# Patient Record
Sex: Male | Born: 1950
Health system: Southern US, Community
[De-identification: ages and names within clinical notes are randomized; demographics above are authoritative.]

## PROBLEM LIST (undated history)

## (undated) DIAGNOSIS — I10 Essential (primary) hypertension: Secondary | ICD-10-CM

## (undated) DIAGNOSIS — K5790 Diverticulosis of intestine, part unspecified, without perforation or abscess without bleeding: Secondary | ICD-10-CM

## (undated) DIAGNOSIS — G4730 Sleep apnea, unspecified: Secondary | ICD-10-CM

## (undated) DIAGNOSIS — R911 Solitary pulmonary nodule: Secondary | ICD-10-CM

## (undated) DIAGNOSIS — G4733 Obstructive sleep apnea (adult) (pediatric): Secondary | ICD-10-CM

## (undated) DIAGNOSIS — D128 Benign neoplasm of rectum: Secondary | ICD-10-CM

## (undated) DIAGNOSIS — E785 Hyperlipidemia, unspecified: Secondary | ICD-10-CM

## (undated) DIAGNOSIS — F329 Major depressive disorder, single episode, unspecified: Secondary | ICD-10-CM

## (undated) DIAGNOSIS — Z8619 Personal history of other infectious and parasitic diseases: Secondary | ICD-10-CM

## (undated) DIAGNOSIS — F32A Depression, unspecified: Secondary | ICD-10-CM

## (undated) DIAGNOSIS — D126 Benign neoplasm of colon, unspecified: Secondary | ICD-10-CM

## (undated) DIAGNOSIS — K219 Gastro-esophageal reflux disease without esophagitis: Secondary | ICD-10-CM

## (undated) DIAGNOSIS — J45909 Unspecified asthma, uncomplicated: Secondary | ICD-10-CM

## (undated) DIAGNOSIS — E559 Vitamin D deficiency, unspecified: Secondary | ICD-10-CM

## (undated) HISTORY — PX: POLYPECTOMY: SHX149

## (undated) HISTORY — DX: Essential (primary) hypertension: I10

## (undated) HISTORY — DX: Vitamin D deficiency, unspecified: E55.9

## (undated) HISTORY — DX: Obstructive sleep apnea (adult) (pediatric): G47.33

## (undated) HISTORY — DX: Benign neoplasm of rectum: D12.8

## (undated) HISTORY — DX: Diverticulosis of intestine, part unspecified, without perforation or abscess without bleeding: K57.90

## (undated) HISTORY — DX: Benign neoplasm of colon, unspecified: D12.6

## (undated) HISTORY — DX: Major depressive disorder, single episode, unspecified: F32.9

## (undated) HISTORY — DX: Personal history of other infectious and parasitic diseases: Z86.19

## (undated) HISTORY — PX: COLONOSCOPY: SHX174

## (undated) HISTORY — DX: Unspecified asthma, uncomplicated: J45.909

## (undated) HISTORY — DX: Hyperlipidemia, unspecified: E78.5

## (undated) HISTORY — DX: Depression, unspecified: F32.A

## (undated) HISTORY — PX: HERNIA REPAIR: SHX51

## (undated) HISTORY — PX: TONSILLECTOMY: SUR1361

## (undated) HISTORY — DX: Gastro-esophageal reflux disease without esophagitis: K21.9

## (undated) HISTORY — DX: Sleep apnea, unspecified: G47.30

## (undated) HISTORY — DX: Solitary pulmonary nodule: R91.1

---

## 2013-05-24 ENCOUNTER — Telehealth: Payer: Self-pay | Admitting: Internal Medicine

## 2013-05-24 NOTE — Telephone Encounter (Signed)
Pt called he had labs done at [facility] 05/20/13 and wanted to make sure you received labs  Please advise

## 2013-05-24 NOTE — Telephone Encounter (Signed)
Do you recall receiving any results from [facility]

## 2013-05-24 NOTE — Telephone Encounter (Signed)
No. I have not received any labs. I have never seen this pt.

## 2013-05-27 NOTE — Telephone Encounter (Signed)
Informed patient we received his results today from [facility].

## 2013-06-03 ENCOUNTER — Encounter: Payer: Self-pay | Admitting: Neurology

## 2013-06-05 ENCOUNTER — Ambulatory Visit: Payer: Self-pay | Admitting: Internal Medicine

## 2013-06-12 ENCOUNTER — Encounter: Payer: Self-pay | Admitting: Internal Medicine

## 2013-06-12 ENCOUNTER — Ambulatory Visit (INDEPENDENT_AMBULATORY_CARE_PROVIDER_SITE_OTHER): Payer: PRIVATE HEALTH INSURANCE | Admitting: Internal Medicine

## 2013-06-12 VITALS — BP 130/72 | HR 88 | Temp 98.2°F | Ht 65.0 in | Wt 284.0 lb

## 2013-06-12 DIAGNOSIS — J309 Allergic rhinitis, unspecified: Secondary | ICD-10-CM

## 2013-06-12 DIAGNOSIS — R400 Somnolence: Secondary | ICD-10-CM

## 2013-06-12 DIAGNOSIS — Z1211 Encounter for screening for malignant neoplasm of colon: Secondary | ICD-10-CM | POA: Insufficient documentation

## 2013-06-12 DIAGNOSIS — F329 Major depressive disorder, single episode, unspecified: Secondary | ICD-10-CM | POA: Insufficient documentation

## 2013-06-12 DIAGNOSIS — F411 Generalized anxiety disorder: Secondary | ICD-10-CM

## 2013-06-12 DIAGNOSIS — M5010 Cervical disc disorder with radiculopathy, unspecified cervical region: Secondary | ICD-10-CM | POA: Insufficient documentation

## 2013-06-12 DIAGNOSIS — J302 Other seasonal allergic rhinitis: Secondary | ICD-10-CM | POA: Insufficient documentation

## 2013-06-12 DIAGNOSIS — F32A Depression, unspecified: Secondary | ICD-10-CM | POA: Insufficient documentation

## 2013-06-12 DIAGNOSIS — G4710 Hypersomnia, unspecified: Secondary | ICD-10-CM

## 2013-06-12 DIAGNOSIS — R300 Dysuria: Secondary | ICD-10-CM

## 2013-06-12 DIAGNOSIS — G4700 Insomnia, unspecified: Secondary | ICD-10-CM

## 2013-06-12 DIAGNOSIS — M5412 Radiculopathy, cervical region: Secondary | ICD-10-CM

## 2013-06-12 LAB — POCT URINALYSIS DIPSTICK
Bilirubin, UA: NEGATIVE
Blood, UA: NEGATIVE
Glucose, UA: NEGATIVE
Leukocytes, UA: NEGATIVE
Nitrite, UA: NEGATIVE
Spec Grav, UA: 1.02
Urobilinogen, UA: 1
pH, UA: 7.5

## 2013-06-12 MED ORDER — TRAZODONE HCL 300 MG PO TABS: 300.0000 mg | ORAL_TABLET | Freq: Every day | ORAL | Status: DC

## 2013-06-12 MED ORDER — ALPRAZOLAM 0.25 MG PO TABS: 0.2500 mg | ORAL_TABLET | Freq: Every evening | ORAL | Status: DC | PRN

## 2013-06-12 MED ORDER — GABAPENTIN 600 MG PO TABS: 600.0000 mg | ORAL_TABLET | Freq: Three times a day (TID) | ORAL | Status: DC

## 2013-06-12 MED ORDER — MOMETASONE FUROATE 50 MCG/ACT NA SUSP: 2.0000 | Freq: Every day | NASAL | Status: DC

## 2013-06-12 NOTE — Assessment & Plan Note (Signed)
Recommended screening colonoscopy. Patient declines and prefers to hold off for now.

## 2013-06-12 NOTE — Assessment & Plan Note (Signed)
Chronic insomnia much improved with use of trazodone. However, he still has trouble initiating sleep. As above, strongly recommended a sleep study to evaluate for obstructive sleep apnea. He declines. Will try low-dose alprazolam to help with initiation of sleep and control of anxiety.

## 2013-06-12 NOTE — Assessment & Plan Note (Signed)
Patient reports history of daytime somnolence. Symptoms are concerning for obstructive sleep apnea. Recommended getting a sleep study. Patient declines citing financial concerns. He would prefer to hold off on this for now. We discussed the potential risk sleep apnea including heart failure. He understands risks.

## 2013-06-12 NOTE — Assessment & Plan Note (Signed)
Body mass index is 47.26 kg/(m^2). Recommended referral for bariatric surgery. Patient would prefer to hold off for now. Will schedule evaluation with nutrition counseling. Reviewed recent labs including thyroid function and blood sugars which were normal.

## 2013-06-12 NOTE — Assessment & Plan Note (Signed)
Intermittent episodes of dysuria. Urinalysis is normal today. We'll continue to monitor.

## 2013-06-12 NOTE — Assessment & Plan Note (Signed)
Chronic history of allergic rhinitis and seasonal allergies. Minimal improvement with over-the-counter Claritin. Will add Nasonex. Patient will call if symptoms are not improving.

## 2013-06-12 NOTE — Assessment & Plan Note (Signed)
Patient with chronic history of anxiety. He reports that symptoms of chronic anxiety are much improved with use of Trazodone. Will continue.

## 2013-06-12 NOTE — Assessment & Plan Note (Signed)
History of chronic cervical radicular pain. Will request records on previous evaluation. We'll continue Neurontin.

## 2013-06-12 NOTE — Progress Notes (Signed)
Subjective:    Patient ID: Paul Cook, male    DOB: 05-29-1951, 62 y.o.   MRN: [id number]  HPI 62 year old male with history of obesity, hypertension, hyperlipidemia, insomnia, anxiety, chronic neck pain presents to establish care.  Insomnia -he reports years of chronic insomnia with both difficulty falling asleep and difficulty staying asleep. His former physician put him on trazodone with improvement in his symptoms. However, he continues to have some difficulty initiating sleep. He also notes daytime drowsiness. 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 He has never been evaluated for sleep apnea. He reports that he snores intermittently.  Anxiety - he also reports a long history of anxiety. He reports improvement with this with use of trazodone.  Obesity - he is concerned about his weight. He has tried following areas diet with no success. His ability to exercise is limited because of morbid obesity and chronic pain in his joints. He has never considered bariatric surgery.  Hypertension - he reports that blood pressure has been well-controlled with lisinopril hydrochlorothiazide. He denies any chest pain, palpitations, headache.  Allergies -he reports seasonal allergies with rhinorrhea and sneezing. He takes occasional over-the-counter Claritin with minimal improvement. He denies any fever, chills, sinus pressure.  Outpatient Encounter Prescriptions as of 06/12/2013  Medication Sig Dispense Refill  . gabapentin (NEURONTIN) 600 MG tablet Take 1 tablet (600 mg total) by mouth 3 (three) times daily.  90 tablet  3  . lisinopril-hydrochlorothiazide (PRINZIDE,ZESTORETIC) 20-25 MG per tablet Take 1 tablet by mouth daily.      . Omega-3 Fatty Acids (FISH OIL) 1000 MG CAPS Take by mouth.      . ranitidine (ZANTAC) 150 MG tablet Take 150 mg by mouth 2 (two) times daily.      . trazodone (DESYREL) 300 MG tablet Take 1 tablet (300 mg total) by mouth at bedtime.  30 tablet  3   . VALERIAN ROOT PO Take 1,000 mg by mouth.      . [DISCONTINUED] gabapentin (NEURONTIN) 600 MG tablet Take 600 mg by mouth 3 (three) times daily.      . [DISCONTINUED] niacin 500 MG tablet Take 500 mg by mouth daily with breakfast.      . [DISCONTINUED] trazodone (DESYREL) 300 MG tablet Take 300 mg by mouth at bedtime.      . ALPRAZolam (XANAX) 0.25 MG tablet Take 1 tablet (0.25 mg total) by mouth at bedtime as needed for sleep.  30 tablet  3  . mometasone (NASONEX) 50 MCG/ACT nasal spray Place 2 sprays into the nose daily.  17 g  12   No facility-administered encounter medications on file as of 06/12/2013.   BP 130/72  Pulse 88  Temp(Src) 98.2 F (36.8 C) (Oral)  Ht 5\' 5"  (1.651 m)  Wt 284 lb (128.822 kg)  BMI 47.26 kg/m2  SpO2 97%  Review of Systems  Constitutional: Positive for fatigue. Negative for fever, chills, activity change, appetite change and unexpected weight change.  HENT: Positive for neck pain (chronic).   Eyes: Negative for visual disturbance.  Respiratory: Negative for cough and shortness of breath.   Cardiovascular: Negative for chest pain, palpitations and leg swelling.  Gastrointestinal: Negative for abdominal pain and abdominal distention.  Genitourinary: Negative for dysuria, urgency and difficulty urinating.  Musculoskeletal: Negative for arthralgias and gait problem.  Skin: Negative for color change and rash.  Hematological: Negative for adenopathy.  Psychiatric/Behavioral: Positive for sleep disturbance. Negative for dysphoric mood. The patient is nervous/anxious.  Objective:   Physical Exam  Constitutional: He is oriented to person, place, and time. He appears well-developed and well-nourished. No distress.  HENT:  Head: Normocephalic and atraumatic.  Right Ear: External ear normal.  Left Ear: External ear normal.  Nose: Nose normal.  Mouth/Throat: Oropharynx is clear and moist. No oropharyngeal exudate.  Eyes: Conjunctivae and EOM are  normal. Pupils are equal, round, and reactive to light. Right eye exhibits no discharge. Left eye exhibits no discharge. No scleral icterus.  Neck: Normal range of motion. Neck supple. No tracheal deviation present. No thyromegaly present.  Cardiovascular: Normal rate, regular rhythm and normal heart sounds.  Exam reveals no gallop and no friction rub.   No murmur heard. Pulmonary/Chest: Effort normal and breath sounds normal. No respiratory distress. He has no wheezes. He has no rales. He exhibits no tenderness.  Abdominal: Soft. Bowel sounds are normal. He exhibits no distension and no mass. There is no tenderness. There is no rebound and no guarding.  Musculoskeletal: Normal range of motion. He exhibits no edema.  Lymphadenopathy:    He has no cervical adenopathy.  Neurological: He is alert and oriented to person, place, and time. No cranial nerve deficit. Coordination normal.  Skin: Skin is warm and dry. No rash noted. He is not diaphoretic. No erythema. No pallor.  Psychiatric: He has a normal mood and affect. His behavior is normal. Judgment and thought content normal.          Assessment & Plan:

## 2013-06-14 ENCOUNTER — Encounter: Payer: Self-pay | Admitting: Internal Medicine

## 2013-06-14 DIAGNOSIS — G4700 Insomnia, unspecified: Secondary | ICD-10-CM

## 2013-06-18 MED ORDER — TRAZODONE HCL 300 MG PO TABS: ORAL_TABLET | ORAL | Status: DC

## 2013-06-19 ENCOUNTER — Encounter: Payer: Self-pay | Admitting: Internal Medicine

## 2013-06-19 ENCOUNTER — Telehealth: Payer: Self-pay | Admitting: *Deleted

## 2013-06-19 NOTE — Telephone Encounter (Signed)
Sent patient a Mychart message.

## 2013-06-19 NOTE — Telephone Encounter (Signed)
Patient stated he was prescribed Xanax, when he first began taking it, it was working well but now it has lost it's effectiveness. It is not working as well for him and he would like to know could it be increased?

## 2013-06-19 NOTE — Telephone Encounter (Signed)
I don't think it is safe to increase dose on any of his sedating medications until we get a sleep study, as these medications can suppress respiration.

## 2013-06-20 NOTE — Telephone Encounter (Signed)
Patient verbally informed and agreed.

## 2013-07-18 ENCOUNTER — Other Ambulatory Visit: Payer: Self-pay | Admitting: *Deleted

## 2013-07-18 DIAGNOSIS — G4700 Insomnia, unspecified: Secondary | ICD-10-CM

## 2013-07-18 NOTE — Telephone Encounter (Signed)
Prior directions were 300 mg at bedtime, now directions are 300 mg tab tid. How should the directions read?

## 2013-07-19 MED ORDER — TRAZODONE HCL 300 MG PO TABS: 300.0000 mg | ORAL_TABLET | Freq: Every day | ORAL | Status: DC

## 2013-07-19 NOTE — Telephone Encounter (Signed)
The directions should read Trazodone 300mg  po at bedtime, #90. 1 refill.

## 2013-07-26 ENCOUNTER — Encounter: Payer: Self-pay | Admitting: *Deleted

## 2013-07-30 ENCOUNTER — Encounter: Payer: Self-pay | Admitting: Internal Medicine

## 2013-07-30 ENCOUNTER — Ambulatory Visit (INDEPENDENT_AMBULATORY_CARE_PROVIDER_SITE_OTHER): Payer: PRIVATE HEALTH INSURANCE | Admitting: Internal Medicine

## 2013-07-30 VITALS — BP 138/74 | HR 73 | Temp 98.3°F | Wt 282.0 lb

## 2013-07-30 DIAGNOSIS — G4710 Hypersomnia, unspecified: Secondary | ICD-10-CM

## 2013-07-30 DIAGNOSIS — R400 Somnolence: Secondary | ICD-10-CM

## 2013-07-30 DIAGNOSIS — K589 Irritable bowel syndrome without diarrhea: Secondary | ICD-10-CM

## 2013-07-30 MED ORDER — HYOSCYAMINE SULFATE 0.125 MG SL SUBL: 0.1250 mg | SUBLINGUAL_TABLET | SUBLINGUAL | Status: DC | PRN

## 2013-07-30 NOTE — Assessment & Plan Note (Addendum)
Symptoms of abdominal cramping after eating some foods such as brocoli. Recommended use of Beano and prn use of Levsin, which has worked well for him in the past.

## 2013-07-30 NOTE — Assessment & Plan Note (Signed)
Congratulated patient on weight loss. Encouraged him to continue to work with a nutritionist.

## 2013-07-30 NOTE — Progress Notes (Signed)
Subjective:    Patient ID: Paul Cook, male    DOB: [birth year], 62 y.o.   MRN: [id number]  HPI 62 year old male with history of obesity, hypertension, insomnia, anxiety presents for followup. At his last visit, we need referral to nutritionist. He reports significant improvement in his diet with limitation of carbohydrate intake. He has lost 2 pounds. He notes some difficulty with abdominal bloating with eating higher fiber foodsHe has not started an exercise . program. He continues to work full-time. He reports that he is sleeping better. Anxiety is better controlled with only intermittent use of alprazolam. He would like to proceed with sleep study which was recommended at his last visit given that he has persistent symptoms of daytime somnolence.  Outpatient Encounter Prescriptions as of 07/30/2013  Medication Sig Dispense Refill  . ALPRAZolam (XANAX) 0.25 MG tablet Take 1 tablet (0.25 mg total) by mouth at bedtime as needed for sleep.  30 tablet  3  . gabapentin (NEURONTIN) 600 MG tablet Take 1 tablet (600 mg total) by mouth 3 (three) times daily.  90 tablet  3  . lisinopril-hydrochlorothiazide (PRINZIDE,ZESTORETIC) 20-25 MG per tablet Take 1 tablet by mouth daily.      . mometasone (NASONEX) 50 MCG/ACT nasal spray Place 2 sprays into the nose daily.  17 g  12  . Omega-3 Fatty Acids (FISH OIL) 1000 MG CAPS Take by mouth.      . ranitidine (ZANTAC) 150 MG tablet Take 150 mg by mouth 2 (two) times daily.      . trazodone (DESYREL) 300 MG tablet Take 1 tablet (300 mg total) by mouth at bedtime.  90 tablet  0  . UNABLE TO FIND Calms Forty      . VALERIAN ROOT PO Take 1,000 mg by mouth.      . hyoscyamine (LEVSIN/SL) 0.125 MG SL tablet Place 1 tablet (0.125 mg total) under the tongue every 4 (four) hours as needed for cramping.  30 tablet  3   No facility-administered encounter medications on file as of 07/30/2013.   BP 138/74  Pulse 73  Temp(Src) 98.3 F (36.8 C) (Oral)  Wt 282 lb  (127.914 kg)  BMI 46.93 kg/m2  SpO2 95%  Review of Systems  Constitutional: Positive for fatigue. Negative for fever, chills, activity change, appetite change and unexpected weight change.  Eyes: Negative for visual disturbance.  Respiratory: Negative for cough and shortness of breath.   Cardiovascular: Negative for chest pain, palpitations and leg swelling.  Gastrointestinal: Negative for abdominal pain and abdominal distention.  Genitourinary: Negative for dysuria, urgency and difficulty urinating.  Musculoskeletal: Negative for arthralgias and gait problem.  Skin: Negative for color change and rash.  Hematological: Negative for adenopathy.  Psychiatric/Behavioral: Positive for sleep disturbance. Negative for dysphoric mood. The patient is not nervous/anxious.        Objective:   Physical Exam  Constitutional: He is oriented to person, place, and time. He appears well-developed and well-nourished. No distress.  HENT:  Head: Normocephalic and atraumatic.  Right Ear: External ear normal.  Left Ear: External ear normal.  Nose: Nose normal.  Mouth/Throat: Oropharynx is clear and moist. No oropharyngeal exudate.  Eyes: Conjunctivae and EOM are normal. Pupils are equal, round, and reactive to light. Right eye exhibits no discharge. Left eye exhibits no discharge. No scleral icterus.  Neck: Normal range of motion. Neck supple. No tracheal deviation present. No thyromegaly present.  Cardiovascular: Normal rate, regular rhythm and normal heart sounds.  Exam reveals no gallop and  no friction rub.   No murmur heard. Pulmonary/Chest: Effort normal and breath sounds normal. No respiratory distress. He has no wheezes. He has no rales. He exhibits no tenderness.  Musculoskeletal: Normal range of motion. He exhibits no edema.  Lymphadenopathy:    He has no cervical adenopathy.  Neurological: He is alert and oriented to person, place, and time. No cranial nerve deficit. Coordination normal.   Skin: Skin is warm and dry. No rash noted. He is not diaphoretic. No erythema. No pallor.  Psychiatric: He has a normal mood and affect. His behavior is normal. Judgment and thought content normal.          Assessment & Plan:

## 2013-07-30 NOTE — Assessment & Plan Note (Signed)
Symptoms persistent. Reviewed labs from 05/2013 which were normal. Again, recommended sleep study. Will look into insurance coverage for this.

## 2013-08-06 ENCOUNTER — Telehealth: Payer: Self-pay | Admitting: Emergency Medicine

## 2013-08-06 NOTE — Telephone Encounter (Signed)
Patient Information:  Caller Name: Deontra  Phone: [phone]  Patient: Paul Cook, Paul Cook  Gender: Male  DOB: 02/07/1951  Age: 62 Years  PCP: Ronna Polio (Adults only)  Office Follow Up:  Does the office need to follow up with this patient?: No  Instructions For The Office: N/A   Symptoms  Reason For Call & Symptoms: Has Rx for Hycosamine and uses for Stomach cramps since has cramps on and off after eating certain food. Over the weekend he ate alot of salads and cramps returned.[facility] Pages Hycosamine 2 tabs this morning and Sx better this afternoon. He will call back if sx return or not responding to treatment.  Reviewed Health History In EMR: Yes  Reviewed Medications In EMR: Yes  Reviewed Allergies In EMR: Yes  Reviewed Surgeries / Procedures: Yes  Date of Onset of Symptoms: 08/06/2013  Guideline(s) Used:  No Protocol Available - Information Only  Disposition Per Guideline:   Home Care  Reason For Disposition Reached:   Information only question and nurse able to answer  Advice Given:  Call Back If:  New symptoms develop  You become worse.  Patient Will Follow Care Advice:  YES

## 2013-08-06 NOTE — Telephone Encounter (Signed)
FYI

## 2013-09-02 ENCOUNTER — Other Ambulatory Visit: Payer: Self-pay | Admitting: *Deleted

## 2013-09-02 DIAGNOSIS — G4700 Insomnia, unspecified: Secondary | ICD-10-CM

## 2013-09-02 MED ORDER — TRAZODONE HCL 300 MG PO TABS: 300.0000 mg | ORAL_TABLET | Freq: Every day | ORAL | Status: DC

## 2013-09-02 NOTE — Telephone Encounter (Signed)
That is fine. You can change the tab strength to 100mg  same instructions

## 2013-09-02 NOTE — Telephone Encounter (Signed)
Pharmacy Note:  Pt would like an Rx for 100 mg Due to cost

## 2013-09-03 ENCOUNTER — Ambulatory Visit (INDEPENDENT_AMBULATORY_CARE_PROVIDER_SITE_OTHER): Payer: PRIVATE HEALTH INSURANCE | Admitting: Internal Medicine

## 2013-09-03 ENCOUNTER — Encounter: Payer: Self-pay | Admitting: Internal Medicine

## 2013-09-03 VITALS — BP 120/72 | HR 88 | Temp 98.2°F | Wt 275.0 lb

## 2013-09-03 DIAGNOSIS — R400 Somnolence: Secondary | ICD-10-CM

## 2013-09-03 DIAGNOSIS — G4700 Insomnia, unspecified: Secondary | ICD-10-CM

## 2013-09-03 DIAGNOSIS — K589 Irritable bowel syndrome without diarrhea: Secondary | ICD-10-CM

## 2013-09-03 DIAGNOSIS — G4710 Hypersomnia, unspecified: Secondary | ICD-10-CM

## 2013-09-03 DIAGNOSIS — F411 Generalized anxiety disorder: Secondary | ICD-10-CM

## 2013-09-03 MED ORDER — LISINOPRIL-HYDROCHLOROTHIAZIDE 20-25 MG PO TABS: 1.0000 | ORAL_TABLET | Freq: Every day | ORAL | Status: DC

## 2013-09-03 MED ORDER — ALPRAZOLAM 0.5 MG PO TABS: 0.5000 mg | ORAL_TABLET | Freq: Every evening | ORAL | Status: DC | PRN

## 2013-09-03 NOTE — Assessment & Plan Note (Signed)
Wt Readings from Last 3 Encounters:  09/03/13 275 lb (124.739 kg)  07/30/13 282 lb (127.914 kg)  06/12/13 284 lb (128.822 kg)   Congratulated pt on weight loss. Encouraged continued healthy diet and regular exercise.

## 2013-09-03 NOTE — Assessment & Plan Note (Signed)
Symptoms recently improved with use of levsin. Will continue to monitor.

## 2013-09-03 NOTE — Assessment & Plan Note (Signed)
Symptoms improved with trazodone and alprazolam, however not completely controlled, with persistent early waking. Will increase alprazolam to 0.5mg  at bedtime to see if any improvement.

## 2013-09-03 NOTE — Progress Notes (Signed)
Pre-visit discussion using our clinic review tool. No additional management support is needed unless otherwise documented below in the visit note.  

## 2013-09-03 NOTE — Progress Notes (Signed)
Subjective:    Patient ID: Paul Cook, male    DOB: August 22, 1951, 62 y.o.   MRN: [id number]  HPI 62YO male with obesity, insomnia, IBS, and suspected sleep apnea presents for follow up. He is generally feeling well. He is following a low-carbohydrate diet and has lost another 7lbs. He continues to have some daytime fatigue. He has not yet had sleep study because of cost, but it is scheduled for next year when his insurance coverage changes. He also continues to struggle with insomnia. He can fall asleep with assistance of alprazolam and trazodone, however he wakes frequently during the night. This has led to worsening daytime fatigue. He also continues to have symptoms of crampy abdominal pain and watery diarrhea, when he eats raw vegetables. Symptoms have been improved with prn Levsin. He denies persistent abdominal pain, fever, chills, nausea, vomiting, change in appetite.  Outpatient Encounter Prescriptions as of 09/03/2013  Medication Sig  . ALPRAZolam (XANAX) 0.5 MG tablet Take 1 tablet (0.5 mg total) by mouth at bedtime as needed for sleep.  Marland Kitchen gabapentin (NEURONTIN) 600 MG tablet Take 1 tablet (600 mg total) by mouth 3 (three) times daily.  . hyoscyamine (LEVSIN/SL) 0.125 MG SL tablet Place 1 tablet (0.125 mg total) under the tongue every 4 (four) hours as needed for cramping.  Marland Kitchen lisinopril-hydrochlorothiazide (PRINZIDE,ZESTORETIC) 20-25 MG per tablet Take 1 tablet by mouth daily.  . Omega-3 Fatty Acids (FISH OIL) 1000 MG CAPS Take by mouth.  . ranitidine (ZANTAC) 150 MG tablet Take 150 mg by mouth 2 (two) times daily.  . traZODone (DESYREL) 100 MG tablet Take 300 mg by mouth at bedtime.   Marland Kitchen UNABLE TO FIND Calms Forty  . VALERIAN ROOT PO Take 1,000 mg by mouth.  . mometasone (NASONEX) 50 MCG/ACT nasal spray Place 2 sprays into the nose daily.   BP 120/72  Pulse 88  Temp(Src) 98.2 F (36.8 C) (Oral)  Wt 275 lb (124.739 kg)  SpO2 95%  Review of Systems  Constitutional: Positive for  fatigue. Negative for fever, chills, activity change, appetite change and unexpected weight change.  Eyes: Negative for visual disturbance.  Respiratory: Positive for apnea (observed when sleeping). Negative for cough and shortness of breath.   Cardiovascular: Negative for chest pain, palpitations and leg swelling.  Gastrointestinal: Positive for abdominal pain (intermittent with eating some foods, such as raw vegetables). Negative for abdominal distention.  Genitourinary: Negative for dysuria, urgency and difficulty urinating.  Musculoskeletal: Negative for arthralgias and gait problem.  Skin: Negative for color change and rash.  Hematological: Negative for adenopathy.  Psychiatric/Behavioral: Negative for sleep disturbance and dysphoric mood. The patient is not nervous/anxious.        Objective:   Physical Exam  Constitutional: He is oriented to person, place, and time. He appears well-developed and well-nourished. No distress.  HENT:  Head: Normocephalic and atraumatic.  Right Ear: External ear normal.  Left Ear: External ear normal.  Nose: Nose normal.  Mouth/Throat: Oropharynx is clear and moist. No oropharyngeal exudate.  Eyes: Conjunctivae and EOM are normal. Pupils are equal, round, and reactive to light. Right eye exhibits no discharge. Left eye exhibits no discharge. No scleral icterus.  Neck: Normal range of motion. Neck supple. No tracheal deviation present. No thyromegaly present.  Cardiovascular: Normal rate, regular rhythm and normal heart sounds.  Exam reveals no gallop and no friction rub.   No murmur heard. Pulmonary/Chest: Effort normal and breath sounds normal. No respiratory distress. He has no wheezes. He has no  rales. He exhibits no tenderness.  Musculoskeletal: Normal range of motion. He exhibits no edema.  Lymphadenopathy:    He has no cervical adenopathy.  Neurological: He is alert and oriented to person, place, and time. No cranial nerve deficit. Coordination  normal.  Skin: Skin is warm and dry. No rash noted. He is not diaphoretic. No erythema. No pallor.  Psychiatric: He has a normal mood and affect. His behavior is normal. Judgment and thought content normal.          Assessment & Plan:

## 2013-09-03 NOTE — Assessment & Plan Note (Signed)
Persistent symptoms. Suspect sleep apnea. Sleep study scheduled for 10/2013, when pt insurance coverage is changing and will cover testing.

## 2013-09-04 ENCOUNTER — Other Ambulatory Visit: Payer: Self-pay | Admitting: *Deleted

## 2013-09-04 MED ORDER — TRAZODONE HCL 100 MG PO TABS: 300.0000 mg | ORAL_TABLET | Freq: Every day | ORAL | Status: DC

## 2013-09-04 NOTE — Telephone Encounter (Signed)
Pt left VM stating Trazodone 100 mg 3 tabs at bedtime had not been received at pharmacy. Rx sent to pharmacy by escript. Pt notified

## 2013-09-05 ENCOUNTER — Encounter: Payer: Self-pay | Admitting: *Deleted

## 2013-10-17 HISTORY — PX: COLONOSCOPY W/ POLYPECTOMY: SHX1380

## 2013-10-30 ENCOUNTER — Telehealth: Payer: Self-pay | Admitting: Internal Medicine

## 2013-10-30 DIAGNOSIS — F411 Generalized anxiety disorder: Secondary | ICD-10-CM

## 2013-10-30 DIAGNOSIS — M5010 Cervical disc disorder with radiculopathy, unspecified cervical region: Secondary | ICD-10-CM

## 2013-10-30 DIAGNOSIS — G4700 Insomnia, unspecified: Secondary | ICD-10-CM

## 2013-10-30 NOTE — Telephone Encounter (Signed)
Pt called his insurance changed needs rx sent to    [facility]   [phone] 90 supply  Gabapenton 200mg   1 tab 3 x daily trazondonne hci 300 mg  1 day linsipril hctz 20-25mg   If not going to need to take this for 3 month please send the below rx to [facility] [place] Alprazolam 0.5   1 tab daily

## 2013-10-31 MED ORDER — LISINOPRIL-HYDROCHLOROTHIAZIDE 20-25 MG PO TABS: 1.0000 | ORAL_TABLET | Freq: Every day | ORAL | Status: DC

## 2013-10-31 MED ORDER — TRAZODONE HCL 100 MG PO TABS: 300.0000 mg | ORAL_TABLET | Freq: Every day | ORAL | Status: DC

## 2013-10-31 MED ORDER — GABAPENTIN 600 MG PO TABS: 600.0000 mg | ORAL_TABLET | Freq: Three times a day (TID) | ORAL | Status: DC

## 2013-10-31 NOTE — Telephone Encounter (Signed)
Prescriptions sent to mail order , not due for refill on Alprazolam.

## 2013-11-07 ENCOUNTER — Ambulatory Visit: Payer: PRIVATE HEALTH INSURANCE | Admitting: Internal Medicine

## 2013-11-08 ENCOUNTER — Ambulatory Visit: Payer: PRIVATE HEALTH INSURANCE | Admitting: Internal Medicine

## 2013-11-21 ENCOUNTER — Ambulatory Visit: Payer: PRIVATE HEALTH INSURANCE | Admitting: Internal Medicine

## 2013-11-21 ENCOUNTER — Telehealth: Payer: Self-pay | Admitting: Internal Medicine

## 2013-11-21 NOTE — Telephone Encounter (Signed)
OK. I have not seen this test result yet.

## 2013-11-21 NOTE — Telephone Encounter (Signed)
Pt called to r/s appointment  He r/s appointment to 3/3 he is waiting for dr walker to get airflow apenia test.  He had this test done last Friday

## 2013-11-21 NOTE — Telephone Encounter (Signed)
Fwd to Dr. Walker 

## 2013-11-29 NOTE — Telephone Encounter (Signed)
Will review once received and discuss at office visit with patient

## 2013-12-13 ENCOUNTER — Telehealth: Payer: Self-pay | Admitting: Internal Medicine

## 2013-12-13 NOTE — Telephone Encounter (Signed)
Patient left voicemail requesting someone to call him back in reference to a prescription that was sent in for him.

## 2013-12-13 NOTE — Telephone Encounter (Signed)
Left message to call back  

## 2013-12-16 NOTE — Telephone Encounter (Signed)
Did he have sleep study performed? I cannot see results in his chart

## 2013-12-16 NOTE — Telephone Encounter (Signed)
Patient left message on voicemail stating he would like to know if you were the one who ordered the CPAP machine for him?

## 2013-12-16 NOTE — Telephone Encounter (Signed)
Spoke with patient informed him we received the order and Dr. Gilford Rile signed it then it was faxed back to company. He has received his machine but wanted to make sure it was done at our office.

## 2013-12-17 ENCOUNTER — Ambulatory Visit: Payer: PRIVATE HEALTH INSURANCE | Admitting: Internal Medicine

## 2013-12-17 ENCOUNTER — Ambulatory Visit (INDEPENDENT_AMBULATORY_CARE_PROVIDER_SITE_OTHER): Payer: Managed Care, Other (non HMO) | Admitting: Internal Medicine

## 2013-12-17 ENCOUNTER — Encounter: Payer: Self-pay | Admitting: Internal Medicine

## 2013-12-17 VITALS — BP 124/84 | HR 91 | Temp 98.3°F | Wt 268.0 lb

## 2013-12-17 DIAGNOSIS — G4733 Obstructive sleep apnea (adult) (pediatric): Secondary | ICD-10-CM | POA: Insufficient documentation

## 2013-12-17 DIAGNOSIS — J309 Allergic rhinitis, unspecified: Secondary | ICD-10-CM

## 2013-12-17 DIAGNOSIS — M5010 Cervical disc disorder with radiculopathy, unspecified cervical region: Secondary | ICD-10-CM

## 2013-12-17 DIAGNOSIS — J302 Other seasonal allergic rhinitis: Secondary | ICD-10-CM

## 2013-12-17 DIAGNOSIS — M5412 Radiculopathy, cervical region: Secondary | ICD-10-CM

## 2013-12-17 DIAGNOSIS — G4700 Insomnia, unspecified: Secondary | ICD-10-CM

## 2013-12-17 MED ORDER — ZALEPLON 5 MG PO CAPS
5.0000 mg | ORAL_CAPSULE | Freq: Every evening | ORAL | Status: DC | PRN
Start: 2013-12-17 — End: 2014-01-14

## 2013-12-17 MED ORDER — MOMETASONE FUROATE 50 MCG/ACT NA SUSP: 2.0000 | Freq: Every day | NASAL | Status: DC

## 2013-12-17 MED ORDER — GABAPENTIN 600 MG PO TABS: 600.0000 mg | ORAL_TABLET | Freq: Three times a day (TID) | ORAL | Status: DC

## 2013-12-17 MED ORDER — RANITIDINE HCL 150 MG PO TABS: 150.0000 mg | ORAL_TABLET | Freq: Two times a day (BID) | ORAL | Status: DC

## 2013-12-17 MED ORDER — TRAZODONE HCL 100 MG PO TABS: 300.0000 mg | ORAL_TABLET | Freq: Every day | ORAL | Status: DC

## 2013-12-17 MED ORDER — ZOSTER VACCINE LIVE 19400 UNT/0.65ML ~~LOC~~ SOLR: 0.6500 mL | Freq: Once | SUBCUTANEOUS | Status: DC

## 2013-12-17 NOTE — Progress Notes (Signed)
Pre visit review using our clinic review tool, if applicable. No additional management support is needed unless otherwise documented below in the visit note. 

## 2013-12-17 NOTE — Progress Notes (Signed)
Subjective:    Patient ID: Paul Cook, male    DOB: 09-Aug-1951, 63 y.o.   MRN: [id number]  HPI 63YO male presents for follow up.  OSA- started CPAP 4 days ago. Tolerating well. Energy level improved.  Insomnia - Continues to have some chronic issues getting to sleep. Has been on Trazodone for years with minimal improvement, however he feels that this medication helps with symptoms of depressed mood. Minimal improvement in insomnia noted with use of Alprazolam.  Obesity - Continues to try to lose weight. Following healthy diet and trying to be more active.  Review of Systems  Constitutional: Negative for fever, chills, activity change, appetite change, fatigue and unexpected weight change.  Eyes: Negative for visual disturbance.  Respiratory: Negative for cough and shortness of breath.   Cardiovascular: Negative for chest pain, palpitations and leg swelling.  Gastrointestinal: Negative for abdominal pain and abdominal distention.  Genitourinary: Negative for dysuria, urgency and difficulty urinating.  Musculoskeletal: Negative for arthralgias and gait problem.  Skin: Negative for color change and rash.  Hematological: Negative for adenopathy.  Psychiatric/Behavioral: Positive for sleep disturbance. Negative for dysphoric mood. The patient is not nervous/anxious.        Objective:    BP 124/84  Pulse 91  Temp(Src) 98.3 F (36.8 C) (Oral)  Wt 268 lb (121.564 kg)  SpO2 95% Physical Exam  Constitutional: He is oriented to person, place, and time. He appears well-developed and well-nourished. No distress.  HENT:  Head: Normocephalic and atraumatic.  Right Ear: External ear normal.  Left Ear: External ear normal.  Nose: Nose normal.  Mouth/Throat: Oropharynx is clear and moist. No oropharyngeal exudate.  Eyes: Conjunctivae and EOM are normal. Pupils are equal, round, and reactive to light. Right eye exhibits no discharge. Left eye exhibits no discharge. No scleral icterus.    Neck: Normal range of motion. Neck supple. No tracheal deviation present. No thyromegaly present.  Cardiovascular: Normal rate, regular rhythm and normal heart sounds.  Exam reveals no gallop and no friction rub.   No murmur heard. Pulmonary/Chest: Effort normal and breath sounds normal. No accessory muscle usage. Not tachypneic. No respiratory distress. He has no decreased breath sounds. He has no wheezes. He has no rhonchi. He has no rales. He exhibits no tenderness.  Musculoskeletal: Normal range of motion. He exhibits no edema.  Lymphadenopathy:    He has no cervical adenopathy.  Neurological: He is alert and oriented to person, place, and time. No cranial nerve deficit. Coordination normal.  Skin: Skin is warm and dry. No rash noted. He is not diaphoretic. No erythema. No pallor.  Psychiatric: He has a normal mood and affect. His behavior is normal. Judgment and thought content normal.          Assessment & Plan:   Problem List Items Addressed This Visit   Cervical disc disorder with radiculopathy of cervical region   Relevant Medications      gabapentin (NEURONTIN) tablet   Insomnia     Persistent symptoms of insomnia. Will try adding Sonata. Follow up 4 weeks and prn.    Relevant Medications      zaleplon (SONATA) capsule      traZODone (DESYREL) tablet   Obstructive sleep apnea - Primary     Tolerating CPAP well. Encouraged him to continue with this.     Seasonal allergies     Symptoms improved with Nasonex. Will continue.    Relevant Medications      mometasone (NASONEX) 50 MCG/ACT nasal  spray   Severe obesity (BMI >= 40)      Wt Readings from Last 3 Encounters:  12/17/13 268 lb (121.564 kg)  09/03/13 275 lb (124.739 kg)  07/30/13 282 lb (127.914 kg)   Congratulated pt on weight loss. Encouraged him to continue effort at healthy diet and regular exercise. Follow up 4 weeks and prn.        Return in about 4 weeks (around 01/14/2014) for Follow up new  medication.

## 2013-12-17 NOTE — Patient Instructions (Signed)
Start Sonata 5mg  daily at bedtime.  Follow up in 4 weeks or sooner as needed.

## 2013-12-18 ENCOUNTER — Telehealth: Payer: Self-pay | Admitting: Internal Medicine

## 2013-12-18 DIAGNOSIS — F411 Generalized anxiety disorder: Secondary | ICD-10-CM

## 2013-12-18 DIAGNOSIS — G4700 Insomnia, unspecified: Secondary | ICD-10-CM

## 2013-12-18 MED ORDER — ALPRAZOLAM 0.5 MG PO TABS: 0.5000 mg | ORAL_TABLET | Freq: Every evening | ORAL | Status: DC | PRN

## 2013-12-18 NOTE — Telephone Encounter (Signed)
[place] for refill, I will fax it when you come in tomorrow. Informed patient it will be done then if approved.

## 2013-12-18 NOTE — Assessment & Plan Note (Signed)
Tolerating CPAP well. Encouraged him to continue with this.

## 2013-12-18 NOTE — Assessment & Plan Note (Signed)
Symptoms improved with Nasonex. Will continue.

## 2013-12-18 NOTE — Assessment & Plan Note (Signed)
Wt Readings from Last 3 Encounters:  12/17/13 268 lb (121.564 kg)  09/03/13 275 lb (124.739 kg)  07/30/13 282 lb (127.914 kg)   Congratulated pt on weight loss. Encouraged him to continue effort at healthy diet and regular exercise. Follow up 4 weeks and prn.

## 2013-12-18 NOTE — Telephone Encounter (Signed)
I thought I had printed it yesterday? If he did not get a copy, it is fine to fill.

## 2013-12-18 NOTE — Assessment & Plan Note (Signed)
Persistent symptoms of insomnia. Will try adding Sonata. Follow up 4 weeks and prn.

## 2013-12-18 NOTE — Telephone Encounter (Signed)
Please fax prescription to [facility] per patient request.

## 2013-12-18 NOTE — Telephone Encounter (Signed)
Prescription printed to be signed and faxed.

## 2013-12-18 NOTE — Telephone Encounter (Signed)
Pt states he was seen last night by Dr. Gilford Rile and told to continue his Xanax.  States he did not receive his prescription before he left.  Pt states he must come by today to pick up the script at 3:00 p.m.  Please advise pt when ready for pick up.

## 2013-12-20 ENCOUNTER — Telehealth: Payer: Self-pay | Admitting: Internal Medicine

## 2013-12-20 NOTE — Telephone Encounter (Signed)
The patient was returning Paul Cook call. He stated that his prescription should be sent to [facility] [address]

## 2014-01-13 ENCOUNTER — Encounter: Payer: Self-pay | Admitting: Internal Medicine

## 2014-01-14 ENCOUNTER — Encounter: Payer: Self-pay | Admitting: Internal Medicine

## 2014-01-14 ENCOUNTER — Ambulatory Visit (INDEPENDENT_AMBULATORY_CARE_PROVIDER_SITE_OTHER): Payer: Managed Care, Other (non HMO) | Admitting: Internal Medicine

## 2014-01-14 VITALS — BP 160/90 | HR 80 | Temp 98.5°F | Wt 270.0 lb

## 2014-01-14 DIAGNOSIS — I10 Essential (primary) hypertension: Secondary | ICD-10-CM | POA: Insufficient documentation

## 2014-01-14 DIAGNOSIS — G4700 Insomnia, unspecified: Secondary | ICD-10-CM

## 2014-01-14 MED ORDER — ZALEPLON 10 MG PO CAPS: 10.0000 mg | ORAL_CAPSULE | Freq: Every evening | ORAL | Status: DC | PRN

## 2014-01-14 NOTE — Progress Notes (Signed)
Subjective:    Patient ID: Paul Cook, male    DOB: 1950/12/31, 63 y.o.   MRN: [id number]  HPI 63YO male presents for follow up.  HTN - accidentally missed BP medication last few days. He denies any chest pain, headache. He reports that previous to this episode, his blood pressure had been well-controlled.  Insomnia - Sleeping better. However, Sonata lost effectiveness at 5mg  and has been taking 10mg  with some improvement. He notes that he continues to wake every couple of hours during the night. He has tried to eliminate distractions in his bedroom. He has a laminated caffeine. Overall, he feels more rested and notes improvement in sleep with CPAP. No more symptoms of daytime somnolence.  Review of Systems  Constitutional: Negative for fever, chills, activity change, appetite change, fatigue and unexpected weight change.  Eyes: Negative for visual disturbance.  Respiratory: Negative for cough and shortness of breath.   Cardiovascular: Negative for chest pain, palpitations and leg swelling.  Gastrointestinal: Negative for abdominal pain and abdominal distention.  Genitourinary: Negative for dysuria, urgency and difficulty urinating.  Musculoskeletal: Negative for arthralgias and gait problem.  Skin: Negative for color change and rash.  Hematological: Negative for adenopathy.  Psychiatric/Behavioral: Positive for sleep disturbance. Negative for dysphoric mood. The patient is not nervous/anxious.        Objective:    BP 160/90  Pulse 80  Temp(Src) 98.5 F (36.9 C) (Oral)  Wt 270 lb (122.471 kg)  SpO2 95% Physical Exam  Constitutional: He is oriented to person, place, and time. He appears well-developed and well-nourished. No distress.  HENT:  Head: Normocephalic and atraumatic.  Right Ear: External ear normal.  Left Ear: External ear normal.  Nose: Nose normal.  Mouth/Throat: Oropharynx is clear and moist. No oropharyngeal exudate.  Eyes: Conjunctivae and EOM are normal.  Pupils are equal, round, and reactive to light. Right eye exhibits no discharge. Left eye exhibits no discharge. No scleral icterus.  Neck: Normal range of motion. Neck supple. No tracheal deviation present. No thyromegaly present.  Cardiovascular: Normal rate, regular rhythm and normal heart sounds.  Exam reveals no gallop and no friction rub.   No murmur heard. Pulmonary/Chest: Effort normal and breath sounds normal. No accessory muscle usage. Not tachypneic. No respiratory distress. He has no decreased breath sounds. He has no wheezes. He has no rhonchi. He has no rales. He exhibits no tenderness.  Musculoskeletal: Normal range of motion. He exhibits no edema.  Lymphadenopathy:    He has no cervical adenopathy.  Neurological: He is alert and oriented to person, place, and time. No cranial nerve deficit. Coordination normal.  Skin: Skin is warm and dry. No rash noted. He is not diaphoretic. No erythema. No pallor.  Psychiatric: He has a normal mood and affect. His behavior is normal. Judgment and thought content normal.          Assessment & Plan:   Problem List Items Addressed This Visit   Essential hypertension, benign      BP Readings from Last 3 Encounters:  01/14/14 160/90  12/17/13 124/84  09/03/13 120/72   Blood pressure markedly elevated today but has been well-controlled in the past. Likely elevated because he is forgot to take his medications for the last several days. Encourage compliance with medications. Encouraged him to check blood pressure at home and call if consistently greater than 140/90. Followup in 3 months or sooner as needed.    Insomnia - Primary     Chronic insomnia refractory  to interventions with good sleep hygiene and medication. For now, given that his symptoms have improved, we'll continue Sonata 10 mg at bedtime. Reviewed good sleep hygiene today. Plan followup in 3 months or sooner as needed.    Relevant Medications      zaleplon (SONATA) capsule        Return in about 3 months (around 04/15/2014) for Physical.

## 2014-01-14 NOTE — Progress Notes (Signed)
Pre visit review using our clinic review tool, if applicable. No additional management support is needed unless otherwise documented below in the visit note. 

## 2014-01-14 NOTE — Assessment & Plan Note (Signed)
Chronic insomnia refractory to interventions with good sleep hygiene and medication. For now, given that his symptoms have improved, we'll continue Sonata 10 mg at bedtime. Reviewed good sleep hygiene today. Plan followup in 3 months or sooner as needed.

## 2014-01-14 NOTE — Assessment & Plan Note (Addendum)
BP Readings from Last 3 Encounters:  01/14/14 160/90  12/17/13 124/84  09/03/13 120/72   Blood pressure markedly elevated today but has been well-controlled in the past. Likely elevated because he is forgot to take his medications for the last several days. Encourage compliance with medications. Encouraged him to check blood pressure at home and call if consistently greater than 140/90. Followup in 3 months or sooner as needed.

## 2014-01-23 ENCOUNTER — Telehealth: Payer: Self-pay | Admitting: Internal Medicine

## 2014-01-23 NOTE — Telephone Encounter (Signed)
The patient is needing something different to help him with his sleep with out coming in for a visit . His co pay is very high.

## 2014-01-24 NOTE — Telephone Encounter (Signed)
He needs to be seen first. He is on multiple medications.

## 2014-01-24 NOTE — Telephone Encounter (Signed)
Pt called to check status of sleep medication.  Advised pt he must be seen.  Pt states certain days and times he can come.  Scheduled with Dr. Derrel Nip.  He states visit is urgent as he needs medication to help with sleep.  Scheduled as acute with Dr. Derrel Nip on 4/15.  Please advise if pt should see Dr. Gilford Rile only for this.

## 2014-01-24 NOTE — Telephone Encounter (Signed)
Please read below...

## 2014-01-24 NOTE — Telephone Encounter (Signed)
Please read below, he does not want to be seen but would like something to help him sleep.

## 2014-01-29 ENCOUNTER — Encounter: Payer: Self-pay | Admitting: Internal Medicine

## 2014-01-29 ENCOUNTER — Ambulatory Visit (INDEPENDENT_AMBULATORY_CARE_PROVIDER_SITE_OTHER): Payer: Managed Care, Other (non HMO) | Admitting: Internal Medicine

## 2014-01-29 VITALS — BP 146/84 | HR 79 | Temp 98.2°F | Resp 18 | Wt 272.0 lb

## 2014-01-29 DIAGNOSIS — G4700 Insomnia, unspecified: Secondary | ICD-10-CM

## 2014-01-29 MED ORDER — ZOLPIDEM TARTRATE ER 12.5 MG PO TBCR: 12.5000 mg | EXTENDED_RELEASE_TABLET | Freq: Every evening | ORAL | Status: DC | PRN

## 2014-01-29 NOTE — Progress Notes (Signed)
Patient ID: Paul Cook, male   DOB: 1951-05-14, 63 y.o.   MRN: [id number]  Patient Active Problem List   Diagnosis Date Noted  . Essential hypertension, benign 01/14/2014  . Obstructive sleep apnea 12/17/2013  . IBS (irritable bowel syndrome) 07/30/2013  . Insomnia 06/12/2013  . Cervical disc disorder with radiculopathy of cervical region 06/12/2013  . Screening for colon cancer 06/12/2013  . Severe obesity (BMI >= 40) 06/12/2013  . Seasonal allergies 06/12/2013  . Anxiety state, unspecified 06/12/2013    Subjective:  CC:   Chief Complaint  Patient presents with  . Acute Visit    HPI:   Paul Cook is a 63 y.o. male who presents for chronic insomnia.  Patient has history of anxiety disorder and has been taking trazodone 300 mg and sonata 10 mg at bedtime for the past month with no improvement.    His employment is at risk bc he has been dosing off at work.  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  He has OSA and has been using CPAP for the past month.   Sonata wears off within 2 hours and he is waking up every 2 hours but can usually go back to sleep within 15 minutes,  But is not asleep long enough for his auto titrating CPAP to adjust based on data collected.  He denies nocturia as the cause .  No prior trial of trazodone.    Past Medical History  Diagnosis Date  . Asthma   . Depression   . GERD (gastroesophageal reflux disease)   . Hypertension   . Diverticulitis   . History of chicken pox     Past Surgical History  Procedure Laterality Date  . Hernia repair      inguinal       The following portions of the patient's history were reviewed and updated as appropriate: Allergies, current medications, and problem list.    Review of Systems:   Patient denies headache, fevers, malaise, unintentional weight loss, skin rash, eye pain, sinus congestion and sinus pain, sore throat, dysphagia,  hemoptysis , cough,  dyspnea, wheezing, chest pain, palpitations, orthopnea, edema, abdominal pain, nausea, melena, diarrhea, constipation, flank pain, dysuria, hematuria, urinary  Frequency, nocturia, numbness, tingling, seizures,  Focal weakness, Loss of consciousness,  Tremor, insomnia, depression, anxiety, and suicidal ideation.     History   Social History  . Marital Status: Married    Spouse Name: N/A    Number of Children: N/A  . Years of Education: N/A   Occupational History  . Not on file.   Social History Main Topics  . Smoking status: Never Smoker   . Smokeless tobacco: Never Used  . Alcohol Use: No  . Drug Use: No  . Sexual Activity: Not on file   Other Topics Concern  . Not on file   Social History Narrative   Lives in [place] with wife, Paul Cook. 1 daughter in [place].      Work - Optician, dispensing, now Animal nutritionist                Objective:  Filed Vitals:   01/29/14 1528  BP: 146/84  Pulse: 79  Temp: 98.2 F (36.8 C)  Resp: 18     General appearance: alert, cooperative and appears stated age Ears: normal TM's and external ear canals both ears Throat: lips, mucosa, and tongue normal; teeth and gums normal Neck: no adenopathy, no carotid bruit,  supple, symmetrical, trachea midline and thyroid not enlarged, symmetric, no tenderness/mass/nodules Back: symmetric, no curvature. ROM normal. No CVA tenderness. Lungs: clear to auscultation bilaterally Heart: regular rate and rhythm, S1, S2 normal, no murmur, click, rub or gallop Abdomen: soft, non-tender; bowel sounds normal; no masses,  no organomegaly Pulses: 2+ and symmetric Skin: Skin color, texture, turgor normal. No rashes or lesions Lymph nodes: Cervical, supraclavicular, and axillary nodes normal.  Assessment and Plan:  Insomnia Discussed stopping sonata and starting ambien CR .  continue trazodone.    Updated Medication List Outpatient Encounter Prescriptions as of 01/29/2014  Medication Sig  .  ALPRAZolam (XANAX) 0.5 MG tablet Take 1 tablet (0.5 mg total) by mouth at bedtime as needed for sleep.  Marland Kitchen gabapentin (NEURONTIN) 600 MG tablet Take 1 tablet (600 mg total) by mouth 3 (three) times daily.  Marland Kitchen lisinopril-hydrochlorothiazide (PRINZIDE,ZESTORETIC) 20-25 MG per tablet Take 1 tablet by mouth daily.  . mometasone (NASONEX) 50 MCG/ACT nasal spray Place 2 sprays into the nose daily.  . ranitidine (ZANTAC) 150 MG tablet Take 1 tablet (150 mg total) by mouth 2 (two) times daily.  . traZODone (DESYREL) 100 MG tablet Take 3 tablets (300 mg total) by mouth at bedtime.  . [DISCONTINUED] zaleplon (SONATA) 10 MG capsule Take 1 capsule (10 mg total) by mouth at bedtime as needed for sleep.  . hyoscyamine (LEVSIN/SL) 0.125 MG SL tablet Place 1 tablet (0.125 mg total) under the tongue every 4 (four) hours as needed for cramping.  . Omega-3 Fatty Acids (FISH OIL) 1000 MG CAPS Take by mouth.  Marland Kitchen UNABLE TO FIND Calms Forty  . VALERIAN ROOT PO Take 1,000 mg by mouth.  . zolpidem (AMBIEN CR) 12.5 MG CR tablet Take 1 tablet (12.5 mg total) by mouth at bedtime as needed for sleep.     No orders of the defined types were placed in this encounter.    No Follow-up on file.

## 2014-01-29 NOTE — Progress Notes (Signed)
Pre-visit discussion using our clinic review tool. No additional management support is needed unless otherwise documented below in the visit note.  

## 2014-01-29 NOTE — Patient Instructions (Signed)
We are changing your medication for Sonata to Ambien cr.  Do not combine with Sonata.  Ok to use trazodone

## 2014-02-01 NOTE — Assessment & Plan Note (Signed)
Discussed stopping sonata and starting ambien CR .  continue trazodone.

## 2014-02-07 ENCOUNTER — Ambulatory Visit: Payer: Managed Care, Other (non HMO) | Admitting: Adult Health

## 2014-02-07 ENCOUNTER — Encounter: Payer: Self-pay | Admitting: Internal Medicine

## 2014-02-07 ENCOUNTER — Ambulatory Visit (INDEPENDENT_AMBULATORY_CARE_PROVIDER_SITE_OTHER): Payer: Managed Care, Other (non HMO) | Admitting: Internal Medicine

## 2014-02-07 VITALS — BP 140/80 | HR 67 | Temp 97.6°F | Wt 269.0 lb

## 2014-02-07 DIAGNOSIS — B372 Candidiasis of skin and nail: Secondary | ICD-10-CM

## 2014-02-07 DIAGNOSIS — G4700 Insomnia, unspecified: Secondary | ICD-10-CM

## 2014-02-07 DIAGNOSIS — Z0000 Encounter for general adult medical examination without abnormal findings: Secondary | ICD-10-CM

## 2014-02-07 MED ORDER — NYSTATIN 100000 UNIT/GM EX CREA: 1.0000 "application " | TOPICAL_CREAM | Freq: Two times a day (BID) | CUTANEOUS | Status: DC

## 2014-02-07 MED ORDER — FLUCONAZOLE 150 MG PO TABS: 150.0000 mg | ORAL_TABLET | ORAL | Status: DC

## 2014-02-07 MED ORDER — NYSTATIN 100000 UNIT/GM EX POWD: CUTANEOUS | Status: DC

## 2014-02-07 MED ORDER — ZOLPIDEM TARTRATE ER 12.5 MG PO TBCR: 12.5000 mg | EXTENDED_RELEASE_TABLET | Freq: Every evening | ORAL | Status: DC | PRN

## 2014-02-07 NOTE — Assessment & Plan Note (Signed)
Symptoms improved with Ambien. Will continue. 

## 2014-02-07 NOTE — Progress Notes (Signed)
Pre visit review using our clinic review tool, if applicable. No additional management support is needed unless otherwise documented below in the visit note. 

## 2014-02-07 NOTE — Patient Instructions (Signed)
Use Nystatin cream and powder as directed.  Start Diflucan 150mg  weekly x 3 weeks.  Use gentle soap like Burts Bees or Dove. Try applying Burts Bees powder to groin area after shower.

## 2014-02-07 NOTE — Assessment & Plan Note (Signed)
Exam consistent with candidal dermatitis. Discussed using topical Nystatin cream and powder and starting weekly Diflucan x 3. Pt will call if no improvement over next couple of days.

## 2014-02-07 NOTE — Progress Notes (Signed)
Subjective:    Patient ID: Paul Cook, male    DOB: 1951/01/17, 63 y.o.   MRN: [id number]  HPI 63YO male presents for acute visit.  Skin irritation in genital area - Ongoing several years. Using OTC hydrocortisone. Had thinning of skin with this. Using OTC zinc oxide cream with some improvement. Then, applied medicated Gold Bond powder. Yesterday, rash became painful, burning. Used "healing cream" which made it worse. Used antibiotic cream which made rash feel better. Continues to have burning and itching.  Insomnia - Symptoms improved with Ambien. No daytime somnolence. Improved energy during the day.  Review of Systems  Constitutional: Negative for fever, chills and fatigue.  Respiratory: Negative for shortness of breath.   Cardiovascular: Negative for leg swelling.  Gastrointestinal: Negative for abdominal pain.  Skin: Positive for color change and rash.  Psychiatric/Behavioral: Positive for sleep disturbance.       Objective:    BP 140/80  Pulse 67  Temp(Src) 97.6 F (36.4 C) (Oral)  Wt 269 lb (122.018 kg)  SpO2 97% Physical Exam  Constitutional: He is oriented to person, place, and time. He appears well-developed and well-nourished. No distress.  HENT:  Head: Normocephalic and atraumatic.  Right Ear: External ear normal.  Left Ear: External ear normal.  Nose: Nose normal.  Mouth/Throat: Oropharynx is clear and moist.  Eyes: Conjunctivae and EOM are normal. Pupils are equal, round, and reactive to light. Right eye exhibits no discharge. Left eye exhibits no discharge. No scleral icterus.  Neck: Normal range of motion. Neck supple. No tracheal deviation present. No thyromegaly present.  Pulmonary/Chest: Effort normal. No accessory muscle usage. Not tachypneic. He has no decreased breath sounds. He has no rhonchi.  Genitourinary:     Musculoskeletal: Normal range of motion. He exhibits no edema.  Lymphadenopathy:    He has no cervical adenopathy.  Neurological: He  is alert and oriented to person, place, and time. No cranial nerve deficit. Coordination normal.  Skin: Skin is warm and dry. No rash noted. He is not diaphoretic. No erythema. No pallor.  Psychiatric: He has a normal mood and affect. His behavior is normal. Judgment and thought content normal.          Assessment & Plan:  Over 9min of which >50% spent in face-to-face contact with patient discussing plan of care  Problem List Items Addressed This Visit   Candidal dermatitis - Primary     Exam consistent with candidal dermatitis. Discussed using topical Nystatin cream and powder and starting weekly Diflucan x 3. Pt will call if no improvement over next couple of days.    Relevant Medications      fluconazole (DIFLUCAN) tablet 150 mg      Nystatin (MYCOSTATIN) 100,000 units/Gm top powder      nystatin cream (MYCOSTATIN)   Insomnia     Symptoms improved with Ambien. Will continue.     Other Visit Diagnoses   Routine general medical examination at a health care facility        Relevant Orders       CBC with Differential       Comprehensive metabolic panel       Lipid panel       Microalbumin / creatinine urine ratio       Vit D  25 hydroxy (rtn osteoporosis monitoring)       Hemoglobin A1c       TSH        Return if symptoms worsen or fail to  improve.

## 2014-02-14 ENCOUNTER — Other Ambulatory Visit: Payer: Self-pay | Admitting: Internal Medicine

## 2014-02-18 ENCOUNTER — Encounter: Payer: Self-pay | Admitting: Internal Medicine

## 2014-02-18 ENCOUNTER — Ambulatory Visit (INDEPENDENT_AMBULATORY_CARE_PROVIDER_SITE_OTHER): Payer: Managed Care, Other (non HMO) | Admitting: Internal Medicine

## 2014-02-18 VITALS — BP 130/80 | HR 67 | Ht 66.0 in | Wt 268.8 lb

## 2014-02-18 DIAGNOSIS — G4700 Insomnia, unspecified: Secondary | ICD-10-CM

## 2014-02-18 DIAGNOSIS — B372 Candidiasis of skin and nail: Secondary | ICD-10-CM

## 2014-02-18 MED ORDER — NYSTATIN 100000 UNIT/GM EX CREA: 1.0000 "application " | TOPICAL_CREAM | Freq: Two times a day (BID) | CUTANEOUS | Status: DC

## 2014-02-18 MED ORDER — ESZOPICLONE 2 MG PO TABS: 2.0000 mg | ORAL_TABLET | Freq: Every evening | ORAL | Status: DC | PRN

## 2014-02-18 NOTE — Assessment & Plan Note (Signed)
Having side effects with Ambien. Will try change to Lunesta 2mg  daily. He will email with update. Follow up 4 weeks and prn.

## 2014-02-18 NOTE — Patient Instructions (Signed)
Stop Ambien.  Start Lunesta 2mg  at bedtime.  Follow up in 4 weeks or sooner as needed.

## 2014-02-18 NOTE — Assessment & Plan Note (Signed)
Significant improvement in symptoms with topical nystatin and oral fluconazole. Encouraged him to keep skin dry and call if any recurrent symptoms.

## 2014-02-18 NOTE — Progress Notes (Signed)
Pre visit review using our clinic review tool, if applicable. No additional management support is needed unless otherwise documented below in the visit note. 

## 2014-02-18 NOTE — Progress Notes (Signed)
   Subjective:    Patient ID: Paul Cook, male    DOB: [birth year], 63 y.o.   MRN: [id number]  HPI 63YO male presents to follow up.  Dermatitis - rash has improved with nystatin cream and powder.  Insomnia - improved with Ambien, but started to have some issues with stumbling. Also felt sleepy in the morning with some confusion. Notes some forgetfulness.    Review of Systems  Constitutional: Positive for fatigue. Negative for fever, chills, activity change, appetite change and unexpected weight change.  Eyes: Negative for visual disturbance.  Respiratory: Negative for cough and shortness of breath.   Cardiovascular: Negative for chest pain, palpitations and leg swelling.  Gastrointestinal: Negative for abdominal pain and abdominal distention.  Genitourinary: Negative for dysuria, urgency and difficulty urinating.  Musculoskeletal: Negative for arthralgias and gait problem.  Skin: Negative for color change and rash.  Hematological: Negative for adenopathy.  Psychiatric/Behavioral: Positive for behavioral problems, confusion, sleep disturbance and decreased concentration. Negative for dysphoric mood. The patient is not nervous/anxious.        Objective:    BP 130/80  Pulse 67  Ht 5\' 6"  (1.676 m)  Wt 268 lb 12 oz (121.904 kg)  BMI 43.40 kg/m2  SpO2 94% Physical Exam  Constitutional: He is oriented to person, place, and time. He appears well-developed and well-nourished. No distress.  HENT:  Head: Normocephalic and atraumatic.  Right Ear: External ear normal.  Left Ear: External ear normal.  Nose: Nose normal.  Mouth/Throat: Oropharynx is clear and moist. No oropharyngeal exudate.  Eyes: Conjunctivae and EOM are normal. Pupils are equal, round, and reactive to light. Right eye exhibits no discharge. Left eye exhibits no discharge. No scleral icterus.  Neck: Normal range of motion. Neck supple. No tracheal deviation present. No thyromegaly present.  Cardiovascular: Normal rate,  regular rhythm and normal heart sounds.  Exam reveals no gallop and no friction rub.   No murmur heard. Pulmonary/Chest: Effort normal and breath sounds normal. No accessory muscle usage. Not tachypneic. No respiratory distress. He has no decreased breath sounds. He has no wheezes. He has no rhonchi. He has no rales. He exhibits no tenderness.  Musculoskeletal: Normal range of motion. He exhibits no edema.  Lymphadenopathy:    He has no cervical adenopathy.  Neurological: He is alert and oriented to person, place, and time. No cranial nerve deficit. Coordination normal.  Skin: Skin is warm and dry. No rash noted. He is not diaphoretic. No erythema. No pallor.  Psychiatric: He has a normal mood and affect. His behavior is normal. Judgment and thought content normal.          Assessment & Plan:   Problem List Items Addressed This Visit   Candidal dermatitis     Significant improvement in symptoms with topical nystatin and oral fluconazole. Encouraged him to keep skin dry and call if any recurrent symptoms.    Relevant Medications      nystatin cream (MYCOSTATIN)   Insomnia - Primary     Having side effects with Ambien. Will try change to Lunesta 2mg  daily. He will email with update. Follow up 4 weeks and prn.    Relevant Medications      eszopiclone (LUNESTA) tablet       Return in about 4 weeks (around 03/18/2014) for Recheck.

## 2014-02-19 ENCOUNTER — Telehealth: Payer: Self-pay | Admitting: Internal Medicine

## 2014-02-19 MED ORDER — ZOLPIDEM TARTRATE 5 MG PO TABS: 5.0000 mg | ORAL_TABLET | Freq: Every evening | ORAL | Status: DC | PRN

## 2014-02-19 NOTE — Telephone Encounter (Signed)
OK. He should STOP the Lunesta. We can call in Ambien 5-10mg  po qhs, disp 60 tablets,  1 refill. He should try the 5mg  Ambien FIRST, then may increase to 10mg  if still trouble sleeping.

## 2014-02-19 NOTE — Telephone Encounter (Signed)
Called with a message:  The Lunesta he took last night did not help at all with sleeping and caused him anxiety.  It kept him up.  He would like to ask about taking her original suggestion to do Ambien not time released.  The Ambien does work and changing to non-time released he thinks he can overcome the side effects.  Pt asking for a call to let him know what the next step will be.

## 2014-02-19 NOTE — Telephone Encounter (Signed)
Pt notified and verbalized understanding. Called Rx to pharmacy. Advised to start with 1 tablet first and may increase to 2 tablets if still trouble sleeping.

## 2014-02-27 ENCOUNTER — Ambulatory Visit: Payer: Managed Care, Other (non HMO) | Admitting: Internal Medicine

## 2014-03-03 ENCOUNTER — Telehealth: Payer: Self-pay | Admitting: Internal Medicine

## 2014-03-03 MED ORDER — LISINOPRIL-HYDROCHLOROTHIAZIDE 20-25 MG PO TABS: 1.0000 | ORAL_TABLET | Freq: Every day | ORAL | Status: DC

## 2014-03-03 NOTE — Telephone Encounter (Signed)
Rx sent to pharmacy by escript  

## 2014-03-03 NOTE — Telephone Encounter (Signed)
Pt called he is out of his lisinopril  He has a 90 day supply on the way but would like a rx sent to [facility] [address]  To get him by for 2 weeks.  [facility]

## 2014-03-06 ENCOUNTER — Encounter: Payer: Self-pay | Admitting: Internal Medicine

## 2014-03-06 ENCOUNTER — Ambulatory Visit (INDEPENDENT_AMBULATORY_CARE_PROVIDER_SITE_OTHER): Payer: Managed Care, Other (non HMO) | Admitting: Internal Medicine

## 2014-03-06 VITALS — BP 136/78 | HR 74 | Temp 98.1°F | Wt 269.5 lb

## 2014-03-06 DIAGNOSIS — R05 Cough: Secondary | ICD-10-CM

## 2014-03-06 DIAGNOSIS — R059 Cough, unspecified: Secondary | ICD-10-CM

## 2014-03-06 MED ORDER — ALBUTEROL SULFATE HFA 108 (90 BASE) MCG/ACT IN AERS: 2.0000 | INHALATION_SPRAY | Freq: Four times a day (QID) | RESPIRATORY_TRACT | Status: DC | PRN

## 2014-03-06 NOTE — Patient Instructions (Addendum)

## 2014-03-06 NOTE — Progress Notes (Signed)
Pre visit review using our clinic review tool, if applicable. No additional management support is needed unless otherwise documented below in the visit note. 

## 2014-03-06 NOTE — Progress Notes (Signed)
Subjective:    Patient ID: Paul Cook, male    DOB: 12/15/1950, 64 y.o.   MRN: [id number]  HPI  Pt presents to the clinic today with c/o cough and chest congestion. This started 2 weeks ago. He reports that he did take a zpack which originally helped his symptoms (ordered by [facility]). The lingering cough is bothering him now. It is nonproductive. He reports it seems to be worse during the day, and triggered by running inside and outside all day long. He reports the change in the temperatures cause him to have coughing fits. He denies fever, chills or body aches. He does have a history of seasonal allergies. He is taking nasonex daily. He has not had sick contacts that he is aware of.  Review of Systems      Past Medical History  Diagnosis Date  . Asthma   . Depression   . GERD (gastroesophageal reflux disease)   . Hypertension   . Diverticulitis   . History of chicken pox     Current Outpatient Prescriptions  Medication Sig Dispense Refill  . ALPRAZolam (XANAX) 0.5 MG tablet Take 1 tablet (0.5 mg total) by mouth at bedtime as needed for sleep.  30 tablet  3  . fluconazole (DIFLUCAN) 150 MG tablet Take 1 tablet (150 mg total) by mouth once a week.  3 tablet  0  . gabapentin (NEURONTIN) 600 MG tablet Take 1 tablet (600 mg total) by mouth 3 (three) times daily.  270 tablet  3  . hyoscyamine (LEVSIN/SL) 0.125 MG SL tablet Place 1 tablet (0.125 mg total) under the tongue every 4 (four) hours as needed for cramping.  30 tablet  3  . lisinopril-hydrochlorothiazide (PRINZIDE,ZESTORETIC) 20-25 MG per tablet Take 1 tablet by mouth daily.  30 tablet  0  . mometasone (NASONEX) 50 MCG/ACT nasal spray Place 2 sprays into the nose daily.  51 g  3  . nystatin (MYCOSTATIN/NYSTOP) 100000 UNIT/GM POWD Apply powder to groin twice daily  60 g  6  . nystatin cream (MYCOSTATIN) Apply 1 application topically 2 (two) times daily.  30 g  6  . Omega-3 Fatty Acids (FISH OIL) 1000 MG CAPS Take by mouth.       . ranitidine (ZANTAC) 150 MG tablet Take 1 tablet (150 mg total) by mouth 2 (two) times daily.  90 tablet  3  . traZODone (DESYREL) 100 MG tablet Take 3 tablets (300 mg total) by mouth at bedtime.  270 tablet  3  . UNABLE TO FIND Calms Forty      . VALERIAN ROOT PO Take 1,000 mg by mouth.      . zolpidem (AMBIEN) 5 MG tablet Take 1-2 tablets (5-10 mg total) by mouth at bedtime as needed for sleep.  60 tablet  1   No current facility-administered medications for this visit.    Allergies  Allergen Reactions  . Nsaids Other (See Comments)    GI Issues    Family History  Problem Relation Age of Onset  . Depression Mother   . Hypertension Father   . Heart disease Father   . Stroke Father   . Cancer Maternal Grandmother     ovarian?  . Cancer Paternal Grandfather     prostate    History   Social History  . Marital Status: Married    Spouse Name: N/A    Number of Children: N/A  . Years of Education: N/A   Occupational History  . Not  on file.   Social History Main Topics  . Smoking status: Never Smoker   . Smokeless tobacco: Never Used  . Alcohol Use: No  . Drug Use: No  . Sexual Activity: Not on file   Other Topics Concern  . Not on file   Social History Narrative   Lives in [place] with wife, [name]. 1 daughter in [place].      Work - Optician, dispensing, now Animal nutritionist                 Constitutional: Denies fever, malaise, fatigue, headache or abrupt weight changes.  HEENT: Denies eye pain, eye redness, ear pain, ringing in the ears, wax buildup, runny nose, nasal congestion, bloody nose, or sore throat. Respiratory: Pt reports cough. Denies difficulty breathing, shortness of breath, or sputum production.     No other specific complaints in a complete review of systems (except as listed in HPI above).  Objective:   Physical Exam  BP 136/78  Pulse 74  Temp(Src) 98.1 F (36.7 C) (Oral)  Wt 269 lb 8 oz (122.244 kg)  SpO2 97% Wt Readings  from Last 3 Encounters:  03/06/14 269 lb 8 oz (122.244 kg)  02/18/14 268 lb 12 oz (121.904 kg)  02/07/14 269 lb (122.018 kg)    General: Appears his stated age, obese but well developed, well nourished in NAD. HEENT: Head: normal shape and size; Eyes: sclera white, no icterus, conjunctiva pink, PERRLA and EOMs intact; Ears: Tm's gray and intact, normal light reflex; Nose: mucosa pink and moist, septum midline; Throat/Mouth: Teeth present, mucosa pink and moist, no exudate, lesions or ulcerations noted.   Cardiovascular: Normal rate and rhythm. S1,S2 noted.  No murmur, rubs or gallops noted. No JVD or BLE edema. No carotid bruits noted. Pulmonary/Chest: Normal effort and positive vesicular breath sounds. No respiratory distress. No wheezes, rales or ronchi noted.        Assessment & Plan:  Cough:  Seems to be triggered by weather I would like him to try albuterol inhaler to see if this helps Keep taking nasonex OTC delsym for cough  If no improvement, follow up with PCP

## 2014-03-21 ENCOUNTER — Ambulatory Visit: Payer: Managed Care, Other (non HMO) | Admitting: Internal Medicine

## 2014-03-25 ENCOUNTER — Telehealth: Payer: Self-pay | Admitting: Internal Medicine

## 2014-03-25 ENCOUNTER — Other Ambulatory Visit: Payer: Self-pay | Admitting: *Deleted

## 2014-03-25 DIAGNOSIS — G4700 Insomnia, unspecified: Secondary | ICD-10-CM

## 2014-03-25 MED ORDER — TRAZODONE HCL 100 MG PO TABS: 300.0000 mg | ORAL_TABLET | Freq: Every day | ORAL | Status: DC

## 2014-03-25 NOTE — Telephone Encounter (Signed)
Pt called requesting Rx refills to be transferred to [facility]

## 2014-03-31 ENCOUNTER — Other Ambulatory Visit: Payer: Self-pay | Admitting: *Deleted

## 2014-03-31 MED ORDER — LISINOPRIL-HYDROCHLOROTHIAZIDE 20-25 MG PO TABS: 1.0000 | ORAL_TABLET | Freq: Every day | ORAL | Status: DC

## 2014-04-15 ENCOUNTER — Other Ambulatory Visit (INDEPENDENT_AMBULATORY_CARE_PROVIDER_SITE_OTHER): Payer: Managed Care, Other (non HMO)

## 2014-04-15 DIAGNOSIS — Z0000 Encounter for general adult medical examination without abnormal findings: Secondary | ICD-10-CM

## 2014-04-15 LAB — LIPID PANEL
Cholesterol: 191 mg/dL (ref 0–200)
HDL: 50.6 mg/dL (ref >39.00)
LDL Cholesterol: 118 mg/dL — ABNORMAL HIGH (ref 0–99)
NonHDL: 140.4
Total CHOL/HDL Ratio: 4
Triglycerides: 113 mg/dL (ref 0.0–149.0)
VLDL: 22.6 mg/dL (ref 0.0–40.0)

## 2014-04-15 LAB — COMPREHENSIVE METABOLIC PANEL
ALT: 29 U/L (ref 0–53)
AST: 28 U/L (ref 0–37)
Albumin: 4.6 g/dL (ref 3.5–5.2)
Alkaline Phosphatase: 48 U/L (ref 39–117)
BUN: 30 mg/dL — ABNORMAL HIGH (ref 6–23)
CO2: 31 mEq/L (ref 19–32)
Calcium: 10 mg/dL (ref 8.4–10.5)
Chloride: 98 mEq/L (ref 96–112)
Creatinine, Ser: 1.2 mg/dL (ref 0.4–1.5)
GFR: 67.67 mL/min (ref >60.00)
Glucose, Bld: 101 mg/dL — ABNORMAL HIGH (ref 70–99)
Potassium: 4.2 mEq/L (ref 3.5–5.1)
Sodium: 137 mEq/L (ref 135–145)
Total Bilirubin: 0.6 mg/dL (ref 0.2–1.2)
Total Protein: 7.5 g/dL (ref 6.0–8.3)

## 2014-04-15 LAB — MICROALBUMIN / CREATININE URINE RATIO
Creatinine,U: 128.8 mg/dL
Microalb Creat Ratio: 0.5 mg/g (ref 0.0–30.0)
Microalb, Ur: 0.7 mg/dL (ref 0.0–1.9)

## 2014-04-15 LAB — CBC WITH DIFFERENTIAL/PLATELET
Basophils Absolute: 0 10*3/uL (ref 0.0–0.1)
Basophils Relative: 0.4 % (ref 0.0–3.0)
Eosinophils Absolute: 0.1 10*3/uL (ref 0.0–0.7)
Eosinophils Relative: 0.9 % (ref 0.0–5.0)
HCT: 42.8 % (ref 39.0–52.0)
Hemoglobin: 14.6 g/dL (ref 13.0–17.0)
Lymphocytes Relative: 20.4 % (ref 12.0–46.0)
Lymphs Abs: 2.1 10*3/uL (ref 0.7–4.0)
MCHC: 34.1 g/dL (ref 30.0–36.0)
MCV: 87.7 fl (ref 78.0–100.0)
Monocytes Absolute: 0.7 10*3/uL (ref 0.1–1.0)
Monocytes Relative: 6.9 % (ref 3.0–12.0)
Neutro Abs: 7.3 10*3/uL (ref 1.4–7.7)
Neutrophils Relative %: 71.4 % (ref 43.0–77.0)
Platelets: 200 10*3/uL (ref 150.0–400.0)
RBC: 4.88 Mil/uL (ref 4.22–5.81)
RDW: 14.3 % (ref 11.5–15.5)
WBC: 10.3 10*3/uL (ref 4.0–10.5)

## 2014-04-15 LAB — HEMOGLOBIN A1C: Hgb A1c MFr Bld: 5.7 % (ref 4.6–6.5)

## 2014-04-15 LAB — TSH: TSH: 1.6 u[IU]/mL (ref 0.35–4.50)

## 2014-04-16 ENCOUNTER — Other Ambulatory Visit: Payer: Self-pay | Admitting: Internal Medicine

## 2014-04-16 LAB — VITAMIN D 25 HYDROXY (VIT D DEFICIENCY, FRACTURES): Vit D, 25-Hydroxy: 45 ng/mL (ref 30–89)

## 2014-04-17 ENCOUNTER — Ambulatory Visit (INDEPENDENT_AMBULATORY_CARE_PROVIDER_SITE_OTHER): Payer: Managed Care, Other (non HMO) | Admitting: Internal Medicine

## 2014-04-17 ENCOUNTER — Telehealth: Payer: Self-pay | Admitting: *Deleted

## 2014-04-17 ENCOUNTER — Encounter: Payer: Self-pay | Admitting: Internal Medicine

## 2014-04-17 ENCOUNTER — Encounter: Payer: Managed Care, Other (non HMO) | Admitting: Internal Medicine

## 2014-04-17 VITALS — BP 116/68 | HR 89 | Temp 98.3°F | Ht 66.5 in | Wt 276.0 lb

## 2014-04-17 DIAGNOSIS — Z0000 Encounter for general adult medical examination without abnormal findings: Secondary | ICD-10-CM | POA: Insufficient documentation

## 2014-04-17 DIAGNOSIS — G4700 Insomnia, unspecified: Secondary | ICD-10-CM

## 2014-04-17 DIAGNOSIS — Z23 Encounter for immunization: Secondary | ICD-10-CM

## 2014-04-17 LAB — HM COLONOSCOPY

## 2014-04-17 MED ORDER — BUPROPION HCL ER (XL) 150 MG PO TB24: 150.0000 mg | ORAL_TABLET | Freq: Every day | ORAL | Status: DC

## 2014-04-17 MED ORDER — ZOLPIDEM TARTRATE 10 MG PO TABS: 10.0000 mg | ORAL_TABLET | Freq: Every day | ORAL | Status: DC

## 2014-04-17 MED ORDER — LORAZEPAM 0.5 MG PO TABS: 0.5000 mg | ORAL_TABLET | Freq: Every day | ORAL | Status: DC

## 2014-04-17 NOTE — Progress Notes (Signed)
Pre visit review using our clinic review tool, if applicable. No additional management support is needed unless otherwise documented below in the visit note. 

## 2014-04-17 NOTE — Assessment & Plan Note (Signed)
Persistent symptoms of insomnia despite Ambien, Trazodone, and Alprazolam. Discussed that the maximum safe dose of Ambien is 10mg  daily. Will stop Alprazolam and start Lorazepam 0.5mg -1mg  at bedtime. Follow up 4 weeks and prn.

## 2014-04-17 NOTE — Assessment & Plan Note (Signed)
General medical exam normal except as noted. Reviewed all recent labs. Reviewed previous labs from [facility]. Discussed risks and benefits of PSA testing. Will hold off on PSA testing until 2016. Colonoscopy declined because of finances. Tdap given today. EKG today.

## 2014-04-17 NOTE — Telephone Encounter (Signed)
Pt called requesting Ambien refill.  Pt was scheduled OV today however, called by [facility] and asked to reschedule until 7.6.15.  Please advise refill.

## 2014-04-17 NOTE — Assessment & Plan Note (Signed)
Wt Readings from Last 3 Encounters:  04/17/14 276 lb (125.193 kg)  03/06/14 269 lb 8 oz (122.244 kg)  02/18/14 268 lb 12 oz (121.904 kg)   Body mass index is 43.89 kg/(m^2). Encouraged continue effort at healthy diet and exercise with water activity. Will start Wellbutrin to help with appetite suppression. Follow up 4 weeks and prn.

## 2014-04-17 NOTE — Addendum Note (Signed)
Addended by: Vernetta Honey on: 04/17/2014 04:30 PM   Modules accepted: Orders

## 2014-04-17 NOTE — Telephone Encounter (Signed)
Last refill 5.28.15, last OV 5.5.15, future OV 7.6.15.  Please advise refill.

## 2014-04-17 NOTE — Progress Notes (Signed)
Subjective:    Patient ID: Paul Cook, male    DOB: 1951-06-23, 63 y.o.   MRN: [id number]  HPI 63YO male presents for annual exam.  Doing well. Several concerns today. Frustrated by difficulty losing and maintaining weight loss. He is following the [employer]'s diet. He is exercising by participating in water activity. He struggles with appetite. He would like to try an appetite suppressant.  Insomnia - He has increased Ambien to 15mg  daily because not able to fall asleep with 10mg  daily. He is also taking Trazodone and Alprazolam with minimal improvement. He has declined referral to sleep specialist because of cost.  He had DRE and PSA which were normal in 2014.  He declines colonoscopy for now, because of cost. Reviewed recent labs which showed normal blood counts, kidney and liver function, thyroid function, A1c, Vit D.   Review of Systems  Constitutional: Negative for fever, chills, activity change, appetite change, fatigue and unexpected weight change.  HENT: Negative for congestion, postnasal drip, rhinorrhea, sinus pressure, sore throat, trouble swallowing and voice change.   Eyes: Negative for visual disturbance.  Respiratory: Negative for cough and shortness of breath.   Cardiovascular: Negative for chest pain, palpitations and leg swelling.  Gastrointestinal: Negative for nausea, vomiting, abdominal pain, diarrhea, constipation, blood in stool and abdominal distention.  Genitourinary: Negative for dysuria, urgency and difficulty urinating.  Musculoskeletal: Negative for arthralgias and gait problem.  Skin: Negative for color change and rash.  Hematological: Negative for adenopathy.  Psychiatric/Behavioral: Positive for sleep disturbance. Negative for dysphoric mood. The patient is not nervous/anxious.        Objective:    BP 116/68  Pulse 89  Temp(Src) 98.3 F (36.8 C) (Oral)  Ht 5' 6.5" (1.689 m)  Wt 276 lb (125.193 kg)  BMI 43.89 kg/m2  SpO2 96% Physical Exam    Constitutional: He is oriented to person, place, and time. He appears well-developed and well-nourished. No distress.  HENT:  Head: Normocephalic and atraumatic.  Right Ear: External ear normal.  Left Ear: External ear normal.  Nose: Nose normal.  Mouth/Throat: Oropharynx is clear and moist. No oropharyngeal exudate.  Eyes: Conjunctivae and EOM are normal. Pupils are equal, round, and reactive to light. Right eye exhibits no discharge. Left eye exhibits no discharge. No scleral icterus.  Neck: Normal range of motion. Neck supple. No tracheal deviation present. No thyromegaly present.  Cardiovascular: Normal rate, regular rhythm and normal heart sounds.  Exam reveals no gallop and no friction rub.   No murmur heard. Pulmonary/Chest: Effort normal and breath sounds normal. No accessory muscle usage. Not tachypneic. No respiratory distress. He has no decreased breath sounds. He has no wheezes. He has no rhonchi. He has no rales. He exhibits no tenderness.  Abdominal: Soft. Bowel sounds are normal. He exhibits no distension and no mass. There is no tenderness. There is no rebound and no guarding.  Musculoskeletal: Normal range of motion. He exhibits no edema.  Lymphadenopathy:    He has no cervical adenopathy.  Neurological: He is alert and oriented to person, place, and time. No cranial nerve deficit. Coordination normal.  Skin: Skin is warm and dry. No rash noted. He is not diaphoretic. No erythema. No pallor.  Psychiatric: He has a normal mood and affect. His behavior is normal. Judgment and thought content normal.          Assessment & Plan:   Problem List Items Addressed This Visit     Unprioritized   Insomnia  Persistent symptoms of insomnia despite Ambien, Trazodone, and Alprazolam. Discussed that the maximum safe dose of Ambien is 10mg  daily. Will stop Alprazolam and start Lorazepam 0.5mg -1mg  at bedtime. Follow up 4 weeks and prn.    Relevant Medications      LORazepam  (ATIVAN) tablet   Routine general medical examination at a health care facility - Primary     General medical exam normal except as noted. Reviewed all recent labs. Reviewed previous labs from [facility]. Discussed risks and benefits of PSA testing. Will hold off on PSA testing until 2016. Colonoscopy declined because of finances. Tdap given today. EKG today.    Relevant Orders      EKG 12-Lead   Severe obesity (BMI >= 40)      Wt Readings from Last 3 Encounters:  04/17/14 276 lb (125.193 kg)  03/06/14 269 lb 8 oz (122.244 kg)  02/18/14 268 lb 12 oz (121.904 kg)   Body mass index is 43.89 kg/(m^2). Encouraged continue effort at healthy diet and exercise with water activity. Will start Wellbutrin to help with appetite suppression. Follow up 4 weeks and prn.    Relevant Medications      buPROPion (WELLBUTRIN XL) 24 hr tablet       Return in about 4 weeks (around 05/15/2014).

## 2014-04-17 NOTE — Patient Instructions (Signed)
Start Wellbutrin 150mg  every morning to help suppress appetite.  Start  Lorazepam 0.5-1mg  at bedtime to help with sleep. Stop Alprazolam.  Follow up 4 weeks.

## 2014-04-21 ENCOUNTER — Encounter: Payer: Managed Care, Other (non HMO) | Admitting: Internal Medicine

## 2014-05-01 ENCOUNTER — Encounter: Payer: Self-pay | Admitting: Family Medicine

## 2014-05-01 ENCOUNTER — Other Ambulatory Visit: Payer: Self-pay | Admitting: Family Medicine

## 2014-05-01 ENCOUNTER — Ambulatory Visit (INDEPENDENT_AMBULATORY_CARE_PROVIDER_SITE_OTHER): Payer: Managed Care, Other (non HMO) | Admitting: Family Medicine

## 2014-05-01 ENCOUNTER — Telehealth: Payer: Self-pay

## 2014-05-01 VITALS — BP 136/74 | HR 80 | Temp 98.0°F | Wt 277.0 lb

## 2014-05-01 DIAGNOSIS — R109 Unspecified abdominal pain: Secondary | ICD-10-CM | POA: Insufficient documentation

## 2014-05-01 LAB — CBC WITH DIFFERENTIAL/PLATELET
Basophils Absolute: 0 10*3/uL (ref 0.0–0.1)
Basophils Relative: 0.2 % (ref 0.0–3.0)
Eosinophils Absolute: 0.1 10*3/uL (ref 0.0–0.7)
Eosinophils Relative: 0.7 % (ref 0.0–5.0)
HCT: 40.4 % (ref 39.0–52.0)
Hemoglobin: 13.5 g/dL (ref 13.0–17.0)
Lymphocytes Relative: 12.8 % (ref 12.0–46.0)
Lymphs Abs: 1.7 10*3/uL (ref 0.7–4.0)
MCHC: 33.4 g/dL (ref 30.0–36.0)
MCV: 88.3 fl (ref 78.0–100.0)
Monocytes Absolute: 0.9 10*3/uL (ref 0.1–1.0)
Monocytes Relative: 6.6 % (ref 3.0–12.0)
Neutro Abs: 10.8 10*3/uL — ABNORMAL HIGH (ref 1.4–7.7)
Neutrophils Relative %: 79.7 % — ABNORMAL HIGH (ref 43.0–77.0)
Platelets: 190 10*3/uL (ref 150.0–400.0)
RBC: 4.57 Mil/uL (ref 4.22–5.81)
RDW: 14.3 % (ref 11.5–15.5)
WBC: 13.5 10*3/uL — ABNORMAL HIGH (ref 4.0–10.5)

## 2014-05-01 MED ORDER — AMOXICILLIN-POT CLAVULANATE 875-125 MG PO TABS: 1.0000 | ORAL_TABLET | Freq: Two times a day (BID) | ORAL | Status: AC

## 2014-05-01 NOTE — Patient Instructions (Addendum)
I'd like to check CBC today (blood count) and we will call you with results and plan. This could be irritable bowel flare, but it could also be a case of diverticulitis. For now, push fluids - plenty of water and rest, and may try levsin (hyoscyamine) for abdominal cramps. Try clear liquid diet liquid for next 24 hours. (ginger ale, chicken broth, jello, gatorade). Let me know if any worsening pain, new fever, or not improving as expected. Good to see you today. I hope you start feeling better.

## 2014-05-01 NOTE — Telephone Encounter (Signed)
Pt left v/m pt was seen earlier today and pt wants to know if OK for pt to take dietary supplemental pills; Probio Max DF, daily probiotic and Total Enzymes. Pt request  cb.

## 2014-05-01 NOTE — Progress Notes (Signed)
Pre visit review using our clinic review tool, if applicable. No additional management support is needed unless otherwise documented below in the visit note. 

## 2014-05-01 NOTE — Telephone Encounter (Signed)
Message left advising patient.  

## 2014-05-01 NOTE — Assessment & Plan Note (Signed)
This could be IBS flare, but in h/o divertulosis need to consider itis - check CBC. Discussed clear liquid diet for next 24-48 hours. Discussed red flags to seek urgent care Update if any worsening. Pt agrees with plan.

## 2014-05-01 NOTE — Telephone Encounter (Signed)
Ok to take daily probiotic, would hold probiomax df and total enzymes for now.

## 2014-05-01 NOTE — Progress Notes (Signed)
BP 136/74  Pulse 80  Temp(Src) 98 F (36.7 C) (Oral)  Wt 277 lb (125.646 kg)   CC: abd discomfort  Subjective:    Patient ID: Paul Cook, male    DOB: August 03, 1951, 63 y.o.   MRN: [id number]  HPI: Paul Cook is a 63 y.o. male presenting on 05/01/2014 for Abdominal Pain and Constipation   [facility] pt of Dr. Thomes Dinning presents today with acute concern of abdominal pain and constipation which is new for him after eating block of mozarella cheese 10 days ago that had been out of refrigerator for 3 days. When active and upright feels worse, when lays supine starts feeling better. Endorses lower abdominal discomfort below umbilicus described as stool urgency with mild cramping but then at times unable to have formed stool. Discomfort relieved by defecation. Passing gas normally. Stooling once a day, normal formed stools. Over last few days more firm stools/harder. Today actually started feeling better.   No recent fevers/chills, nausea/vomiting, blood or mucous in stool. No diarrhea. No dysuria, urgency, frequency.  Stays well hydrated with plenty of water. H/o IBS - on hyoscyamine prn abd cramping. Has tried levsin which provided some relief of abd discomfort. Tried tramadol as well which helped some with abd discomfort. Started taking miralax 1/2 capful a few days ago - states this tends to take 3 days prior to taking effect.  No known h/o lactose intolerance. More trouble with hot spices. Recently started on wellbutrin for appetite suppression. Also recently started on lorazepam for sleep.  H/o divertulosis by barium enema per patient in past, denies h/o diverticulitis.  Wt Readings from Last 3 Encounters:  05/01/14 277 lb (125.646 kg)  04/17/14 276 lb (125.193 kg)  03/06/14 269 lb 8 oz (122.244 kg)   Body mass index is 44.04 kg/(m^2).  Relevant past medical, surgical, family and social history reviewed and updated as indicated.  Allergies and medications reviewed and  updated. Current Outpatient Prescriptions on File Prior to Visit  Medication Sig  . albuterol (PROVENTIL HFA;VENTOLIN HFA) 108 (90 BASE) MCG/ACT inhaler Inhale 2 puffs into the lungs every 6 (six) hours as needed for wheezing or shortness of breath.  Marland Kitchen buPROPion (WELLBUTRIN XL) 150 MG 24 hr tablet Take 1 tablet (150 mg total) by mouth daily.  . fluconazole (DIFLUCAN) 150 MG tablet Take 1 tablet (150 mg total) by mouth once a week.  . gabapentin (NEURONTIN) 600 MG tablet Take 1 tablet (600 mg total) by mouth 3 (three) times daily.  . hyoscyamine (LEVSIN/SL) 0.125 MG SL tablet Place 1 tablet (0.125 mg total) under the tongue every 4 (four) hours as needed for cramping.  Marland Kitchen lisinopril-hydrochlorothiazide (PRINZIDE,ZESTORETIC) 20-25 MG per tablet Take 1 tablet by mouth daily.  Marland Kitchen LORazepam (ATIVAN) 0.5 MG tablet Take 1-2 tablets (0.5-1 mg total) by mouth at bedtime.  . mometasone (NASONEX) 50 MCG/ACT nasal spray Place 2 sprays into the nose daily.  Marland Kitchen nystatin (MYCOSTATIN/NYSTOP) 100000 UNIT/GM POWD Apply powder to groin twice daily  . nystatin cream (MYCOSTATIN) Apply 1 application topically 2 (two) times daily.  . Omega-3 Fatty Acids (FISH OIL) 1000 MG CAPS Take by mouth.  . ranitidine (ZANTAC) 150 MG tablet Take 1 tablet (150 mg total) by mouth 2 (two) times daily.  . traZODone (DESYREL) 100 MG tablet Take 3 tablets (300 mg total) by mouth at bedtime.  Marland Kitchen UNABLE TO FIND Calms Forty  . VALERIAN ROOT PO Take 1,000 mg by mouth.  . zolpidem (AMBIEN) 10 MG tablet Take 1 tablet (  10 mg total) by mouth at bedtime.   No current facility-administered medications on file prior to visit.    Review of Systems Per HPI unless specifically indicated above    Objective:    BP 136/74  Pulse 80  Temp(Src) 98 F (36.7 C) (Oral)  Wt 277 lb (125.646 kg)  Physical Exam  Nursing note and vitals reviewed. Constitutional: He appears well-developed and well-nourished. No distress.  HENT:  Mouth/Throat:  Oropharynx is clear and moist. No oropharyngeal exudate.  Cardiovascular: Normal rate, regular rhythm, normal heart sounds and intact distal pulses.   No murmur heard. Pulmonary/Chest: Effort normal and breath sounds normal. No respiratory distress. He has no wheezes. He has no rales.  Abdominal: Soft. Normal appearance and bowel sounds are normal. He exhibits no distension and no mass. There is no hepatosplenomegaly. There is tenderness (L>R) in the right lower quadrant and left lower quadrant. There is no rigidity, no rebound, no guarding, no CVA tenderness and negative Murphy's sign. No hernia.  obese  Musculoskeletal: He exhibits no edema.  Psychiatric: He has a normal mood and affect.   Results for orders placed in visit on 04/17/14  [facility] COLONOSCOPY      Result Value Ref Range   [facility] Colonoscopy declines, revisit end of 2015        Assessment & Plan:   Problem List Items Addressed This Visit   Abdominal discomfort - Primary     This could be IBS flare, but in h/o divertulosis need to consider itis - check CBC. Discussed clear liquid diet for next 24-48 hours. Discussed red flags to seek urgent care Update if any worsening. Pt agrees with plan.    Relevant Orders      CBC with Differential       Follow up plan: Return if symptoms worsen or fail to improve.

## 2014-05-06 ENCOUNTER — Other Ambulatory Visit: Payer: Self-pay | Admitting: Internal Medicine

## 2014-05-07 ENCOUNTER — Other Ambulatory Visit: Payer: Self-pay

## 2014-05-07 MED ORDER — HYOSCYAMINE SULFATE 0.125 MG SL SUBL: SUBLINGUAL_TABLET | SUBLINGUAL | Status: DC

## 2014-05-07 NOTE — Telephone Encounter (Signed)
Refilled. Ok to take 2 at once but watch for constipation or urinary retention or dry mouth with 2 at once

## 2014-05-07 NOTE — Telephone Encounter (Signed)
Patient notified

## 2014-05-07 NOTE — Telephone Encounter (Signed)
Pt saw Dr Danise Mina on 05/01/14 since Dr Gilford Rile is out of office; pt said feeling better but request refill on hyoscyamine to [place]. Pt wants to know if cramping is strong can pt take 2 of the hyoscyamine tabs. Pt request cb.

## 2014-05-08 ENCOUNTER — Telehealth: Payer: Self-pay | Admitting: Family Medicine

## 2014-05-08 NOTE — Telephone Encounter (Signed)
Pt called office requesting a diet to follow to prevent flare ups of diverticulitis.   Before 3 pm call # [phone]  Or after 3 pm call cell # [phone]

## 2014-05-09 ENCOUNTER — Other Ambulatory Visit: Payer: Self-pay | Admitting: Internal Medicine

## 2014-05-09 NOTE — Telephone Encounter (Signed)
Mailed to patient. Advised him to expect it next week. He verbalized understanding.

## 2014-05-09 NOTE — Telephone Encounter (Signed)
Printed diet and placed in Kim's box.

## 2014-06-06 ENCOUNTER — Ambulatory Visit (INDEPENDENT_AMBULATORY_CARE_PROVIDER_SITE_OTHER): Payer: Managed Care, Other (non HMO) | Admitting: Internal Medicine

## 2014-06-06 ENCOUNTER — Encounter: Payer: Self-pay | Admitting: Internal Medicine

## 2014-06-06 VITALS — BP 120/70 | HR 73 | Temp 98.1°F | Ht 66.5 in | Wt 271.5 lb

## 2014-06-06 DIAGNOSIS — M5412 Radiculopathy, cervical region: Secondary | ICD-10-CM

## 2014-06-06 DIAGNOSIS — M5010 Cervical disc disorder with radiculopathy, unspecified cervical region: Secondary | ICD-10-CM

## 2014-06-06 DIAGNOSIS — Z1211 Encounter for screening for malignant neoplasm of colon: Secondary | ICD-10-CM

## 2014-06-06 DIAGNOSIS — I10 Essential (primary) hypertension: Secondary | ICD-10-CM

## 2014-06-06 DIAGNOSIS — R109 Unspecified abdominal pain: Secondary | ICD-10-CM

## 2014-06-06 DIAGNOSIS — G4700 Insomnia, unspecified: Secondary | ICD-10-CM

## 2014-06-06 MED ORDER — LISINOPRIL-HYDROCHLOROTHIAZIDE 20-25 MG PO TABS: 1.0000 | ORAL_TABLET | Freq: Every day | ORAL | Status: DC

## 2014-06-06 MED ORDER — BUPROPION HCL ER (XL) 300 MG PO TB24: 300.0000 mg | ORAL_TABLET | Freq: Every day | ORAL | Status: DC

## 2014-06-06 MED ORDER — RANITIDINE HCL 150 MG PO TABS: 150.0000 mg | ORAL_TABLET | Freq: Two times a day (BID) | ORAL | Status: DC

## 2014-06-06 MED ORDER — TRAZODONE HCL 100 MG PO TABS: 300.0000 mg | ORAL_TABLET | Freq: Every day | ORAL | Status: DC

## 2014-06-06 MED ORDER — GABAPENTIN 600 MG PO TABS
600.0000 mg | ORAL_TABLET | Freq: Three times a day (TID) | ORAL | Status: DC
Start: 2014-06-06 — End: 2015-03-30

## 2014-06-06 NOTE — Assessment & Plan Note (Signed)
Symptoms improved with Ambien, Trazodone and prn Lorazepam. Will continue.

## 2014-06-06 NOTE — Assessment & Plan Note (Signed)
Wt Readings from Last 3 Encounters:  06/06/14 271 lb 8 oz (123.152 kg)  05/01/14 277 lb (125.646 kg)  04/17/14 276 lb (125.193 kg)   Congratulated pt on weight loss. Encouraged continued effort at healthy diet and exercise. Continue Wellbutrin to help with appetite suppression. Increase dose of Wellbutrin to 300mg  daily. Follow up 4 weeks and prn.

## 2014-06-06 NOTE — Assessment & Plan Note (Signed)
BP Readings from Last 3 Encounters:  06/06/14 120/70  05/01/14 136/74  04/17/14 116/68   BP well controlled on current medication. Will continue.

## 2014-06-06 NOTE — Progress Notes (Signed)
Pre visit review using our clinic review tool, if applicable. No additional management support is needed unless otherwise documented below in the visit note. 

## 2014-06-06 NOTE — Patient Instructions (Addendum)
Try using right wrist brace tonight.  Increase Wellbutrin to 300mg  daily.  Follow up in 4 weeks and as needed.

## 2014-06-06 NOTE — Assessment & Plan Note (Signed)
Referral placed for colonoscopy. 

## 2014-06-06 NOTE — Progress Notes (Signed)
Subjective:    Patient ID: Paul Cook, male    DOB: September 04, 1951, 63 y.o.   MRN: [id number]  HPI 63YO male presents for follow up.  Recently seen for abdominal pain. Treated with antibiotics for diverticulitis. Symptoms improved with this. Also developed "stomach flu" last weekend with nausea/vomiting/diarrhea. Symptoms have now resolved.  Sleeping better with use of Ambien, Trazodone and prn Lorazepam. No daytime drowsiness. Feels rested during the day. Functioning well at work.  Slept on right wrist wrong last night. Had some swelling this morning. No recent injury to wrist. No decrease in ROM or strength.  Review of Systems  Constitutional: Negative for fever, chills, activity change, appetite change, fatigue and unexpected weight change.  Eyes: Negative for visual disturbance.  Respiratory: Negative for cough and shortness of breath.   Cardiovascular: Negative for chest pain, palpitations and leg swelling.  Gastrointestinal: Negative for nausea, vomiting, abdominal pain, diarrhea, constipation and abdominal distention.  Genitourinary: Negative for dysuria, urgency and difficulty urinating.  Musculoskeletal: Positive for arthralgias, joint swelling and myalgias. Negative for gait problem.  Skin: Negative for color change and rash.  Hematological: Negative for adenopathy.  Psychiatric/Behavioral: Positive for sleep disturbance. Negative for dysphoric mood. The patient is not nervous/anxious.        Objective:    BP 120/70  Pulse 73  Temp(Src) 98.1 F (36.7 C) (Oral)  Ht 5' 6.5" (1.689 m)  Wt 271 lb 8 oz (123.152 kg)  BMI 43.17 kg/m2  SpO2 96% Physical Exam  Constitutional: He is oriented to person, place, and time. He appears well-developed and well-nourished. No distress.  HENT:  Head: Normocephalic and atraumatic.  Right Ear: External ear normal.  Left Ear: External ear normal.  Nose: Nose normal.  Mouth/Throat: Oropharynx is clear and moist. No oropharyngeal  exudate.  Eyes: Conjunctivae and EOM are normal. Pupils are equal, round, and reactive to light. Right eye exhibits no discharge. Left eye exhibits no discharge. No scleral icterus.  Neck: Normal range of motion. Neck supple. No tracheal deviation present. No thyromegaly present.  Cardiovascular: Normal rate, regular rhythm and normal heart sounds.  Exam reveals no gallop and no friction rub.   No murmur heard. Pulmonary/Chest: Effort normal and breath sounds normal. No accessory muscle usage. Not tachypneic. No respiratory distress. He has no decreased breath sounds. He has no wheezes. He has no rhonchi. He has no rales. He exhibits no tenderness.  Abdominal: Soft. Bowel sounds are normal. He exhibits no distension. There is no tenderness.  Musculoskeletal: Normal range of motion. He exhibits no edema.       Right wrist: He exhibits swelling (mild over proximal right forearm). He exhibits normal range of motion, no tenderness, no bony tenderness and no deformity.  Lymphadenopathy:    He has no cervical adenopathy.  Neurological: He is alert and oriented to person, place, and time. No cranial nerve deficit. Coordination normal.  Skin: Skin is warm and dry. No rash noted. He is not diaphoretic. No erythema. No pallor.  Psychiatric: He has a normal mood and affect. His behavior is normal. Judgment and thought content normal.          Assessment & Plan:   Problem List Items Addressed This Visit     Unprioritized   Abdominal discomfort     Symptoms have completely resolved after recent diverticulitis. Will monitor.    Cervical disc disorder with radiculopathy of cervical region   Relevant Medications      gabapentin (NEURONTIN) tablet   Essential  hypertension, benign      BP Readings from Last 3 Encounters:  06/06/14 120/70  05/01/14 136/74  04/17/14 116/68   BP well controlled on current medication. Will continue.    Relevant Medications      lisinopril-hydrochlorothiazide  (PRINZIDE,ZESTORETIC) 20-25 MG per tablet   Insomnia - Primary     Symptoms improved with Ambien, Trazodone and prn Lorazepam. Will continue.    Relevant Medications      traZODone (DESYREL) tablet   Screening for colon cancer     Referral placed for colonoscopy.    Severe obesity (BMI >= 40)      Wt Readings from Last 3 Encounters:  06/06/14 271 lb 8 oz (123.152 kg)  05/01/14 277 lb (125.646 kg)  04/17/14 276 lb (125.193 kg)   Congratulated pt on weight loss. Encouraged continued effort at healthy diet and exercise. Continue Wellbutrin to help with appetite suppression. Increase dose of Wellbutrin to 300mg  daily. Follow up 4 weeks and prn.    Relevant Medications      buPROPion (WELLBUTRIN XL) 24 hr tablet    Other Visit Diagnoses   Encounter for screening colonoscopy        Relevant Orders       Ambulatory referral to Gastroenterology        Return in about 4 weeks (around 07/04/2014) for Recheck.

## 2014-06-06 NOTE — Assessment & Plan Note (Signed)
Symptoms have completely resolved after recent diverticulitis. Will monitor.

## 2014-06-09 ENCOUNTER — Telehealth: Payer: Self-pay | Admitting: Internal Medicine

## 2014-06-09 NOTE — Telephone Encounter (Signed)
Relevant patient education assigned to patient using Emmi. ° °

## 2014-06-17 ENCOUNTER — Other Ambulatory Visit: Payer: Self-pay | Admitting: Internal Medicine

## 2014-06-20 ENCOUNTER — Telehealth: Payer: Self-pay | Admitting: Internal Medicine

## 2014-06-20 NOTE — Telephone Encounter (Signed)
Please advise 

## 2014-06-20 NOTE — Telephone Encounter (Signed)
Fine to write new order for CPAP, but we will need to know new recommended settings.

## 2014-06-20 NOTE — Telephone Encounter (Signed)
Pt has sleep apnea machine and stated is needing to have more air pressure from machine but the company [employer] is needing an order from Dr.Walker.

## 2014-07-02 NOTE — Telephone Encounter (Signed)
Left vm requesting pt to return my call 

## 2014-07-04 NOTE — Telephone Encounter (Signed)
Pt states that he spoke with [employer] and they are sending him a new face mask to see if this solves his issue, pt will call us if he needs anything further

## 2014-07-15 ENCOUNTER — Ambulatory Visit (INDEPENDENT_AMBULATORY_CARE_PROVIDER_SITE_OTHER): Payer: Managed Care, Other (non HMO) | Admitting: Internal Medicine

## 2014-07-15 ENCOUNTER — Encounter: Payer: Self-pay | Admitting: Internal Medicine

## 2014-07-15 VITALS — BP 122/80 | HR 69 | Temp 97.9°F | Ht 66.5 in | Wt 269.2 lb

## 2014-07-15 DIAGNOSIS — Z23 Encounter for immunization: Secondary | ICD-10-CM

## 2014-07-15 DIAGNOSIS — Z1211 Encounter for screening for malignant neoplasm of colon: Secondary | ICD-10-CM

## 2014-07-15 DIAGNOSIS — G4700 Insomnia, unspecified: Secondary | ICD-10-CM

## 2014-07-15 MED ORDER — LORAZEPAM 0.5 MG PO TABS: 0.5000 mg | ORAL_TABLET | Freq: Every day | ORAL | Status: DC

## 2014-07-15 NOTE — Progress Notes (Signed)
Pre visit review using our clinic review tool, if applicable. No additional management support is needed unless otherwise documented below in the visit note. 

## 2014-07-15 NOTE — Assessment & Plan Note (Signed)
Encouraged him to follow through with prep and colonoscopy. Discussed importance of prep.

## 2014-07-15 NOTE — Assessment & Plan Note (Signed)
Wt Readings from Last 3 Encounters:  07/15/14 269 lb 4 oz (122.131 kg)  06/06/14 271 lb 8 oz (123.152 kg)  05/01/14 277 lb (125.646 kg)   Body mass index is 42.81 kg/(m^2). Congratulated pt on weight loss. Encouraged continued healthy diet and exercise.

## 2014-07-15 NOTE — Assessment & Plan Note (Signed)
Symptoms improved with current regimen of Ambien 5mg  and only rare use of Lorazepam. Will continue to monitor.

## 2014-07-15 NOTE — Progress Notes (Signed)
Subjective:    Patient ID: Paul Cook, male    DOB: 12-Jun-1951, 63 y.o.   MRN: [id number]  HPI 63YO male presents for follo wup.  Scheduled for colonoscopy Oct 19th. Concerned about prep and risk of abdominal cramping.  Insomnia - Taking Ambien 5mg  daily and stopped taking Lorazepam. Sleeping well. No daytime drowsiness.  Review of Systems  Constitutional: Negative for fever, chills, activity change, appetite change, fatigue and unexpected weight change.  Eyes: Negative for visual disturbance.  Respiratory: Negative for cough and shortness of breath.   Cardiovascular: Negative for chest pain, palpitations and leg swelling.  Gastrointestinal: Negative for nausea, vomiting, abdominal pain, diarrhea, constipation and abdominal distention.  Genitourinary: Negative for dysuria, urgency and difficulty urinating.  Musculoskeletal: Negative for arthralgias and gait problem.  Skin: Negative for color change and rash.  Hematological: Negative for adenopathy.  Psychiatric/Behavioral: Positive for sleep disturbance. Negative for dysphoric mood. The patient is not nervous/anxious.        Objective:    BP 122/80  Pulse 69  Temp(Src) 97.9 F (36.6 C) (Oral)  Ht 5' 6.5" (1.689 m)  Wt 269 lb 4 oz (122.131 kg)  BMI 42.81 kg/m2  SpO2 96% Physical Exam  Constitutional: He is oriented to person, place, and time. He appears well-developed and well-nourished. No distress.  HENT:  Head: Normocephalic and atraumatic.  Right Ear: External ear normal.  Left Ear: External ear normal.  Nose: Nose normal.  Mouth/Throat: Oropharynx is clear and moist.  Eyes: Conjunctivae and EOM are normal. Pupils are equal, round, and reactive to light. Right eye exhibits no discharge. Left eye exhibits no discharge. No scleral icterus.  Neck: Normal range of motion. Neck supple. No tracheal deviation present. No thyromegaly present.  Cardiovascular: Normal rate, regular rhythm and normal heart sounds.  Exam  reveals no gallop and no friction rub.   No murmur heard. Pulmonary/Chest: Effort normal and breath sounds normal. No accessory muscle usage. Not tachypneic. No respiratory distress. He has no decreased breath sounds. He has no wheezes. He has no rhonchi. He has no rales. He exhibits no tenderness.  Musculoskeletal: Normal range of motion. He exhibits no edema.  Lymphadenopathy:    He has no cervical adenopathy.  Neurological: He is alert and oriented to person, place, and time. No cranial nerve deficit. Coordination normal.  Skin: Skin is warm and dry. No rash noted. He is not diaphoretic. No erythema. No pallor.  Psychiatric: He has a normal mood and affect. His behavior is normal. Judgment and thought content normal.          Assessment & Plan:   Problem List Items Addressed This Visit     Unprioritized   Insomnia - Primary     Symptoms improved with current regimen of Ambien 5mg  and only rare use of Lorazepam. Will continue to monitor.    Relevant Medications      LORazepam (ATIVAN) tablet   Screening for colon cancer     Encouraged him to follow through with prep and colonoscopy. Discussed importance of prep.    Severe obesity (BMI >= 40)      Wt Readings from Last 3 Encounters:  07/15/14 269 lb 4 oz (122.131 kg)  06/06/14 271 lb 8 oz (123.152 kg)  05/01/14 277 lb (125.646 kg)   Body mass index is 42.81 kg/(m^2). Congratulated pt on weight loss. Encouraged continued healthy diet and exercise.     Other Visit Diagnoses   Need for prophylactic vaccination and inoculation against influenza  Relevant Orders       Flu Vaccine QUAD 36+ mos PF IM (Fluarix Quad PF) (Completed)        Return in about 3 months (around 10/14/2014) for Recheck.

## 2014-07-15 NOTE — Patient Instructions (Signed)
Flu shot today 

## 2014-07-18 ENCOUNTER — Telehealth: Payer: Self-pay

## 2014-07-18 DIAGNOSIS — M25579 Pain in unspecified ankle and joints of unspecified foot: Secondary | ICD-10-CM

## 2014-07-18 NOTE — Telephone Encounter (Signed)
The patient called hoping to get a referral to [facility].  He states the arch in his foot is painful and feels like he has pulled a muscle.

## 2014-07-20 LAB — HM COLONOSCOPY

## 2014-07-20 NOTE — Telephone Encounter (Signed)
Referral placed.

## 2014-07-23 ENCOUNTER — Ambulatory Visit: Payer: Self-pay | Admitting: Podiatry

## 2014-08-04 ENCOUNTER — Ambulatory Visit: Payer: Self-pay | Admitting: Gastroenterology

## 2014-08-06 LAB — PATHOLOGY REPORT

## 2014-08-15 ENCOUNTER — Telehealth: Payer: Self-pay | Admitting: Internal Medicine

## 2014-08-15 DIAGNOSIS — C189 Malignant neoplasm of colon, unspecified: Secondary | ICD-10-CM

## 2014-08-15 NOTE — Telephone Encounter (Signed)
Mr. Krumholz called saying he recently had a Colonoscopy and found that one of the polyps is malignant. He needs surgery but can't get an appt without a referral. He's very anxious to have the surgery since having found out he has cancer and is wondering if a referral can be sent on his behalf asap. I'm not sure who he needs the referral to go to. Please call the patient.  Pt ph# [phone] Thank you.

## 2014-08-15 NOTE — Telephone Encounter (Signed)
Can you please try to get his reports from his colonoscopy and find out who he would like to be referred to? I have not received anything on this.

## 2014-08-15 NOTE — Telephone Encounter (Signed)
Pt states he had colonoscopy by Dr. Gustavo Lah, has a "large cancerous polyp" that needs to be removed. States Dr. Gustavo Lah did not want to do this procedure, referred him to Dr. Leodis Binet. States Dr. Tamala Julian discussed a large surgery for him that would require taking out a large section of his colon, but then advised that just the polyp should be removed first and that he didn't want to do surgery on the patient until he lost 100 pounds. Pt says Dr. Marton Redwood office is not returning his call about what he needs to do. Pt is requesting a referral to another GI doctor to discuss what he needs to do since Dr. Marton Redwood office not returning his call.

## 2014-08-15 NOTE — Telephone Encounter (Signed)
Sent for colonoscopy and pathology reports

## 2014-08-15 NOTE — Telephone Encounter (Signed)
We need to review notes from Dr. Tamala Julian at [facility]. He may need referral to colorectal surgery at [facility]. I would recommend Dr. Hester Mates.

## 2014-08-18 ENCOUNTER — Telehealth: Payer: Self-pay | Admitting: Internal Medicine

## 2014-08-18 NOTE — Telephone Encounter (Signed)
Pt notified referral in process to Dr. Hester Mates at [facility] and that our [facility] will call once an appointment has been scheduled.

## 2014-08-18 NOTE — Telephone Encounter (Signed)
Spoke to patient, advised we have not received notes yet from [facility], he will try to obtain them also and drop them off. See other telephone encounter as well.

## 2014-08-18 NOTE — Telephone Encounter (Signed)
The patient called asking who should see for his colorectal surgery.

## 2014-08-18 NOTE — Addendum Note (Signed)
Addended by: Ronette Deter A on: 08/18/2014 02:10 PM   Modules accepted: Orders

## 2014-08-18 NOTE — Telephone Encounter (Addendum)
Records placed in [facility] folder

## 2014-08-18 NOTE — Telephone Encounter (Signed)
Spoke to patient, he called again today, advised we have not received records from [facility] yet, he will attempt to obtain as well.

## 2014-09-04 ENCOUNTER — Ambulatory Visit: Payer: Self-pay | Admitting: Family Medicine

## 2014-09-04 ENCOUNTER — Telehealth: Payer: Self-pay | Admitting: Internal Medicine

## 2014-09-04 NOTE — Telephone Encounter (Signed)
FYI

## 2014-09-04 NOTE — Telephone Encounter (Signed)
We can keep an eye on the schedule and maybe we can work him in today earlier with Lorane Gell

## 2014-09-04 NOTE — Telephone Encounter (Signed)
Patient Information:  Caller Name: Paul Cook  Phone: [phone]  Patient: Paul Cook, Paul Cook  Gender: Male  DOB: 05-Dec-1950  Age: 63 Years  PCP: Ronette Deter (Adults only)  Office Follow Up:  Does the office need to follow up with this patient?: Yes  Instructions For The Office: Pt states Dr Lajean Manes with [facility] was suppose to fax his surgery records over to the office.   Pt wants to make sure these are on the chart for Dr Diona Browner to review at the appt.   If the office has not received these records, please call pt so he can contact [place].  RN Note:  No appts at [facility] location, appt made at 1600 with Dr Diona Browner at [facility] location.  Symptoms  Reason For Call & Symptoms: 08/27/14 pt had colonoscopy to remove large polyp thought to be cancerous.  Procedure went well, but pt is still continuing to have pain on the lower left side where polyp was removed.   09/03/14 pain seemed to be gone.    09/04/14 pt was walking up stairs quickly due to rain and felt pain coming back again.  He states it is not intense, rates 2-3/10, pain comes and goes.  Pt called surgeon earlier in the week and was told if pain continued longer than a week this was not normal and he would need to follow up with his PCP.  Afebrile.  No constipation/diarrhea, no blood in stool.   Pt is not taking any medications for the pain.  Reviewed Health History In EMR: Yes  Reviewed Medications In EMR: Yes  Reviewed Allergies In EMR: Yes  Reviewed Surgeries / Procedures: Yes  Date of Onset of Symptoms: 08/27/2014  Guideline(s) Used:  Abdominal Pain - Male  Disposition Per Guideline:   See Today in Office  Reason For Disposition Reached:   Mild pain that comes and goes (cramps) lasts > 24 hours  Advice Given:  Call Back If:  You become worse.  Patient Will Follow Care Advice:  YES  Appointment Scheduled:  09/04/2014 16:00:00 Appointment Scheduled Provider:  Other

## 2014-09-05 ENCOUNTER — Telehealth: Payer: Self-pay | Admitting: Internal Medicine

## 2014-09-05 ENCOUNTER — Emergency Department: Payer: Self-pay | Admitting: Emergency Medicine

## 2014-09-05 LAB — COMPREHENSIVE METABOLIC PANEL
Albumin: 3.7 g/dL (ref 3.4–5.0)
Alkaline Phosphatase: 66 U/L
Anion Gap: 6 — ABNORMAL LOW (ref 7–16)
BUN: 18 mg/dL (ref 7–18)
Bilirubin,Total: 0.3 mg/dL (ref 0.2–1.0)
Calcium, Total: 8.3 mg/dL — ABNORMAL LOW (ref 8.5–10.1)
Chloride: 106 mmol/L (ref 98–107)
Co2: 28 mmol/L (ref 21–32)
Creatinine: 1.04 mg/dL (ref 0.60–1.30)
EGFR (African American): >60
EGFR (Non-African Amer.): >60
Glucose: 77 mg/dL (ref 65–99)
Osmolality: 280 (ref 275–301)
Potassium: 3.8 mmol/L (ref 3.5–5.1)
SGOT(AST): 22 U/L (ref 15–37)
SGPT (ALT): 39 U/L
Sodium: 140 mmol/L (ref 136–145)
Total Protein: 7.2 g/dL (ref 6.4–8.2)

## 2014-09-05 LAB — CBC WITH DIFFERENTIAL/PLATELET
Basophil #: 0.1 10*3/uL (ref 0.0–0.1)
Basophil %: 0.5 %
Eosinophil #: 0.1 10*3/uL (ref 0.0–0.7)
Eosinophil %: 1 %
HCT: 40.8 % (ref 40.0–52.0)
HGB: 13.6 g/dL (ref 13.0–18.0)
Lymphocyte #: 2.6 10*3/uL (ref 1.0–3.6)
Lymphocyte %: 20.8 %
MCH: 29.7 pg (ref 26.0–34.0)
MCHC: 33.3 g/dL (ref 32.0–36.0)
MCV: 89 fL (ref 80–100)
Monocyte #: 0.9 x10 3/mm (ref 0.2–1.0)
Monocyte %: 7.2 %
Neutrophil #: 8.7 10*3/uL — ABNORMAL HIGH (ref 1.4–6.5)
Neutrophil %: 70.5 %
Platelet: 234 10*3/uL (ref 150–440)
RBC: 4.57 10*6/uL (ref 4.40–5.90)
RDW: 13.7 % (ref 11.5–14.5)
WBC: 12.4 10*3/uL — ABNORMAL HIGH (ref 3.8–10.6)

## 2014-09-05 LAB — URINALYSIS, COMPLETE
Bacteria: NONE SEEN
Bilirubin,UR: NEGATIVE
Blood: NEGATIVE
Glucose,UR: NEGATIVE mg/dL (ref 0–75)
Ketone: NEGATIVE
Leukocyte Esterase: NEGATIVE
Nitrite: NEGATIVE
Ph: 5 (ref 4.5–8.0)
Protein: NEGATIVE
RBC,UR: NONE SEEN /HPF (ref 0–5)
Specific Gravity: 1.016 (ref 1.003–1.030)
Squamous Epithelial: NONE SEEN
WBC UR: <1 /HPF (ref 0–5)

## 2014-09-05 NOTE — Telephone Encounter (Signed)
Pt notified and  verbalized understanding, will go to ED to be evaluated

## 2014-09-05 NOTE — Telephone Encounter (Signed)
Please review pervious message and advise.

## 2014-09-05 NOTE — Telephone Encounter (Signed)
With worsening pain after surgery, he needs to be seen immediately. I would recommend he is seen in the ED for evaluation with CT abdomen to be sure no area of infection or problems with his colon.

## 2014-09-05 NOTE — Telephone Encounter (Signed)
See other phone note, sent to ED for evaluation today.

## 2014-09-05 NOTE — Telephone Encounter (Signed)
Mr. Joost called saying his pain is getting steadily worse in the area of where he had a colonoscopy last week. Yesterday on a scale of 1-10 it was a 3 or 4 but today it's a 6 and getting worse. He said he spoke to "Call A Nurse" yesterday and she suggested he go to [facility]. He was told he wouldn't be seen at [facility] (by receptionist) due to the seriousness of his symptoms and the cause of the pain. He's aware he has an appt here on Tues but is afraid to go over the weekend knowing the pain is increasing. He's wondering if there's any way he can be seen today. He's afraid if he goes to Urgent Care he'll get misdiagnosed and won't receive the care he needs. Please call the pt and advise him of his options. Pt ph# [phone] Thank you.

## 2014-09-09 ENCOUNTER — Ambulatory Visit: Payer: Self-pay | Admitting: Internal Medicine

## 2014-09-10 ENCOUNTER — Encounter: Payer: Self-pay | Admitting: Internal Medicine

## 2014-09-19 ENCOUNTER — Other Ambulatory Visit: Payer: Self-pay | Admitting: Internal Medicine

## 2014-09-19 NOTE — Telephone Encounter (Signed)
rx faxed

## 2014-09-19 NOTE — Telephone Encounter (Signed)
Last refill 10.25.15, last OV 9.29.15.  Please advise refill

## 2014-10-14 ENCOUNTER — Ambulatory Visit: Payer: Managed Care, Other (non HMO) | Admitting: Internal Medicine

## 2014-10-20 ENCOUNTER — Ambulatory Visit (INDEPENDENT_AMBULATORY_CARE_PROVIDER_SITE_OTHER): Payer: Managed Care, Other (non HMO) | Admitting: Internal Medicine

## 2014-10-20 ENCOUNTER — Encounter: Payer: Self-pay | Admitting: Internal Medicine

## 2014-10-20 VITALS — BP 128/77 | HR 70 | Temp 98.2°F | Ht 66.5 in | Wt 270.2 lb

## 2014-10-20 DIAGNOSIS — K5792 Diverticulitis of intestine, part unspecified, without perforation or abscess without bleeding: Secondary | ICD-10-CM | POA: Insufficient documentation

## 2014-10-20 DIAGNOSIS — Z860100 Personal history of colon polyps, unspecified: Secondary | ICD-10-CM

## 2014-10-20 DIAGNOSIS — K5732 Diverticulitis of large intestine without perforation or abscess without bleeding: Secondary | ICD-10-CM

## 2014-10-20 DIAGNOSIS — B372 Candidiasis of skin and nail: Secondary | ICD-10-CM

## 2014-10-20 DIAGNOSIS — I10 Essential (primary) hypertension: Secondary | ICD-10-CM

## 2014-10-20 MED ORDER — NYSTATIN 100000 UNIT/GM EX POWD: CUTANEOUS | Status: DC

## 2014-10-20 MED ORDER — ZOLPIDEM TARTRATE 5 MG PO TABS: 5.0000 mg | ORAL_TABLET | Freq: Every evening | ORAL | Status: DC | PRN

## 2014-10-20 NOTE — Progress Notes (Signed)
Subjective:    Patient ID: Paul Cook, male    DOB: [birth year], 64 y.o.   MRN: [id number]  HPI 64YO male presents for follow up.  Initially, had colonoscopy with large polyp in sigmoid colon and preliminary biopsy suspicious for malignancy. Pt sought second opinion at [facility]. Now s/p resection of sigmoid polyp at [facility] 08/2014. Final pathology was benign.  Seen this weekend 1/2 by Dr. Baldemar Lenis at urgent care for diverticulitis. Friday evening developed left lower quadrant pain. Started on Cipro and Flagyl. Pain improved over 24 hours. Tolerating a full diet. No rectal bleeding or blood in stool. No fever.   Wt Readings from Last 3 Encounters:  10/20/14 270 lb 4 oz (122.585 kg)  07/15/14 269 lb 4 oz (122.131 kg)  06/06/14 271 lb 8 oz (123.152 kg)     Past medical, surgical, family and social history per today's encounter.  Review of Systems  Constitutional: Negative for fever, chills, activity change, appetite change, fatigue and unexpected weight change.  Eyes: Negative for visual disturbance.  Respiratory: Negative for cough and shortness of breath.   Cardiovascular: Negative for chest pain, palpitations and leg swelling.  Gastrointestinal: Positive for abdominal pain (left lower quad). Negative for nausea, vomiting, diarrhea, constipation, blood in stool, abdominal distention and rectal pain.  Genitourinary: Negative for dysuria, urgency and difficulty urinating.  Musculoskeletal: Negative for myalgias, arthralgias and gait problem.  Skin: Negative for color change and rash.  Hematological: Negative for adenopathy.  Psychiatric/Behavioral: Negative for sleep disturbance and dysphoric mood. The patient is not nervous/anxious.        Objective:    BP 128/77 mmHg  Pulse 70  Temp(Src) 98.2 F (36.8 C) (Oral)  Ht 5' 6.5" (1.689 m)  Wt 270 lb 4 oz (122.585 kg)  BMI 42.97 kg/m2  SpO2 97% Physical Exam  Constitutional: He is oriented to person, place, and time. He appears  well-developed and well-nourished. No distress.  HENT:  Head: Normocephalic and atraumatic.  Right Ear: External ear normal.  Left Ear: External ear normal.  Nose: Nose normal.  Mouth/Throat: Oropharynx is clear and moist. No oropharyngeal exudate.  Eyes: Conjunctivae and EOM are normal. Pupils are equal, round, and reactive to light. Right eye exhibits no discharge. Left eye exhibits no discharge. No scleral icterus.  Neck: Normal range of motion. Neck supple. No tracheal deviation present. No thyromegaly present.  Cardiovascular: Normal rate, regular rhythm and normal heart sounds.  Exam reveals no gallop and no friction rub.   No murmur heard. Pulmonary/Chest: Effort normal and breath sounds normal. No accessory muscle usage. No tachypnea. No respiratory distress. He has no decreased breath sounds. He has no wheezes. He has no rhonchi. He has no rales. He exhibits no tenderness.  Abdominal: Soft. Bowel sounds are normal. He exhibits no distension and no mass. There is tenderness (mild LLQ). There is no rebound and no guarding.  Musculoskeletal: Normal range of motion. He exhibits no edema.  Lymphadenopathy:    He has no cervical adenopathy.  Neurological: He is alert and oriented to person, place, and time. No cranial nerve deficit. Coordination normal.  Skin: Skin is warm and dry. No rash noted. He is not diaphoretic. No erythema. No pallor.  Psychiatric: He has a normal mood and affect. His behavior is normal. Judgment and thought content normal.          Assessment & Plan:   Problem List Items Addressed This Visit      Unprioritized   Candidal dermatitis  Symptoms improved with Nystatin powder. Will continue.    Relevant Medications      metroNIDAZOLE (FLAGYL) 500 MG tablet      nystatin (MYCOSTATIN/NYSTOP) 100000 UNIT/GM POWD   Diverticulitis - Primary    Symptoms are most consistent with diverticulitis. Will continue course of Cipro and Flagyl. If any persistent or  worsening symptoms, plan for CT abd for evaluation. Follow up 4 weeks and prn.    Relevant Orders      CBC with Differential      Comp Met (CMET)   Essential hypertension, benign    BP Readings from Last 3 Encounters:  10/20/14 128/77  07/15/14 122/80  06/06/14 120/70   BP well controlled on current medications. Renal function with labs today.    History of colonic polyps    Reviewed notes from [place]. Final path on sigmoid polyp, resected at [facility], was benign.    Severe obesity (BMI >= 40)    Wt Readings from Last 3 Encounters:  10/20/14 270 lb 4 oz (122.585 kg)  07/15/14 269 lb 4 oz (122.131 kg)  06/06/14 271 lb 8 oz (123.152 kg)   Body mass index is 42.97 kg/(m^2). The patient is asked to make an attempt to improve diet and exercise patterns to aid in medical management of this problem.         Return in about 4 weeks (around 11/17/2014) for Recheck.

## 2014-10-20 NOTE — Assessment & Plan Note (Signed)
BP Readings from Last 3 Encounters:  10/20/14 128/77  07/15/14 122/80  06/06/14 120/70   BP well controlled on current medications. Renal function with labs today.

## 2014-10-20 NOTE — Assessment & Plan Note (Signed)
Symptoms improved with Nystatin powder. Will continue.

## 2014-10-20 NOTE — Assessment & Plan Note (Signed)
Symptoms are most consistent with diverticulitis. Will continue course of Cipro and Flagyl. If any persistent or worsening symptoms, plan for CT abd for evaluation. Follow up 4 weeks and prn.

## 2014-10-20 NOTE — Assessment & Plan Note (Signed)
Reviewed notes from [place]. Final path on sigmoid polyp, resected at [facility], was benign.

## 2014-10-20 NOTE — Patient Instructions (Signed)
Continue Cipro and Flagyl for Diverticulitis.  Call immediately if any worsening abdominal pain, fever.  Follow up in 4 weeks.

## 2014-10-20 NOTE — Progress Notes (Signed)
Pre visit review using our clinic review tool, if applicable. No additional management support is needed unless otherwise documented below in the visit note. 

## 2014-10-20 NOTE — Assessment & Plan Note (Signed)
Wt Readings from Last 3 Encounters:  10/20/14 270 lb 4 oz (122.585 kg)  07/15/14 269 lb 4 oz (122.131 kg)  06/06/14 271 lb 8 oz (123.152 kg)   Body mass index is 42.97 kg/(m^2). The patient is asked to make an attempt to improve diet and exercise patterns to aid in medical management of this problem.

## 2014-10-21 LAB — COMPREHENSIVE METABOLIC PANEL
ALT: 29 U/L (ref 0–53)
AST: 29 U/L (ref 0–37)
Albumin: 4.4 g/dL (ref 3.5–5.2)
Alkaline Phosphatase: 57 U/L (ref 39–117)
BUN: 19 mg/dL (ref 6–23)
CO2: 26 mEq/L (ref 19–32)
Calcium: 8.7 mg/dL (ref 8.4–10.5)
Chloride: 102 mEq/L (ref 96–112)
Creatinine, Ser: 1 mg/dL (ref 0.4–1.5)
GFR: 80.18 mL/min (ref >60.00)
Glucose, Bld: 105 mg/dL — ABNORMAL HIGH (ref 70–99)
Potassium: 4 mEq/L (ref 3.5–5.1)
Sodium: 137 mEq/L (ref 135–145)
Total Bilirubin: 0.6 mg/dL (ref 0.2–1.2)
Total Protein: 7.1 g/dL (ref 6.0–8.3)

## 2014-10-21 LAB — CBC WITH DIFFERENTIAL/PLATELET
Basophils Absolute: 0 10*3/uL (ref 0.0–0.1)
Basophils Relative: 0.3 % (ref 0.0–3.0)
Eosinophils Absolute: 0.1 10*3/uL (ref 0.0–0.7)
Eosinophils Relative: 0.8 % (ref 0.0–5.0)
HCT: 41.1 % (ref 39.0–52.0)
Hemoglobin: 13.6 g/dL (ref 13.0–17.0)
Lymphocytes Relative: 15.9 % (ref 12.0–46.0)
Lymphs Abs: 2 10*3/uL (ref 0.7–4.0)
MCHC: 33.1 g/dL (ref 30.0–36.0)
MCV: 89.7 fl (ref 78.0–100.0)
Monocytes Absolute: 0.6 10*3/uL (ref 0.1–1.0)
Monocytes Relative: 4.8 % (ref 3.0–12.0)
Neutro Abs: 9.7 10*3/uL — ABNORMAL HIGH (ref 1.4–7.7)
Neutrophils Relative %: 78.2 % — ABNORMAL HIGH (ref 43.0–77.0)
Platelets: 222 10*3/uL (ref 150.0–400.0)
RBC: 4.59 Mil/uL (ref 4.22–5.81)
RDW: 13.9 % (ref 11.5–15.5)
WBC: 12.4 10*3/uL — ABNORMAL HIGH (ref 4.0–10.5)

## 2014-10-30 ENCOUNTER — Telehealth: Payer: Self-pay | Admitting: Internal Medicine

## 2014-10-30 DIAGNOSIS — R1031 Right lower quadrant pain: Secondary | ICD-10-CM

## 2014-10-30 NOTE — Telephone Encounter (Signed)
Pt stopped by the office to state that his pain has returned and needs CT Scan( pt needs appt to be after 3pm). Please advise pt/msn

## 2014-10-30 NOTE — Telephone Encounter (Signed)
He has persistent lower abdominal pain after resection of a polyp. I have ordered a CT abdomen for further evaluation.

## 2014-11-10 ENCOUNTER — Encounter: Payer: Self-pay | Admitting: Internal Medicine

## 2014-11-10 ENCOUNTER — Ambulatory Visit (INDEPENDENT_AMBULATORY_CARE_PROVIDER_SITE_OTHER): Payer: Managed Care, Other (non HMO) | Admitting: Internal Medicine

## 2014-11-10 VITALS — BP 145/79 | HR 75 | Temp 98.0°F | Ht 66.5 in | Wt 275.0 lb

## 2014-11-10 DIAGNOSIS — K644 Residual hemorrhoidal skin tags: Secondary | ICD-10-CM | POA: Insufficient documentation

## 2014-11-10 DIAGNOSIS — K648 Other hemorrhoids: Secondary | ICD-10-CM

## 2014-11-10 MED ORDER — HYDROCORTISONE ACE-PRAMOXINE 2.5-1 % RE CREA: 1.0000 "application " | TOPICAL_CREAM | Freq: Three times a day (TID) | RECTAL | Status: DC

## 2014-11-10 NOTE — Progress Notes (Signed)
   Subjective:    Patient ID: Paul Cook, male    DOB: 1951/05/27, 64 y.o.   MRN: [id number]  HPI 64YO male presents for acute visit.  Flare of hemorrhoids with swelling and pain over rectum starting about 2-3 weeks ago. Pain with BM. However, denies constipation. Using OTC PrepH suppositories with no improved. Some improvement with hot bath. Some blood on tissue paper first couple of days, now resolved. No abdominal pain currently.    Past medical, surgical, family and social history per today's encounter.  Review of Systems  Constitutional: Negative for fever, chills, activity change, appetite change, fatigue and unexpected weight change.  Eyes: Negative for visual disturbance.  Respiratory: Negative for cough and shortness of breath.   Cardiovascular: Negative for chest pain, palpitations and leg swelling.  Gastrointestinal: Positive for anal bleeding and rectal pain. Negative for nausea, abdominal pain, diarrhea, constipation and abdominal distention.  Genitourinary: Negative for dysuria, urgency and difficulty urinating.  Musculoskeletal: Negative for arthralgias and gait problem.  Skin: Negative for color change and rash.  Hematological: Negative for adenopathy.  Psychiatric/Behavioral: Negative for sleep disturbance and dysphoric mood. The patient is not nervous/anxious.        Objective:    BP 145/79 mmHg  Pulse 75  Temp(Src) 98 F (36.7 C) (Oral)  Ht 5' 6.5" (1.689 m)  Wt 275 lb (124.739 kg)  BMI 43.73 kg/m2  SpO2 97% Physical Exam  Constitutional: He is oriented to person, place, and time. He appears well-developed and well-nourished. No distress.  HENT:  Head: Normocephalic.  Eyes: Conjunctivae and EOM are normal. Pupils are equal, round, and reactive to light.  Neck: Normal range of motion.  Pulmonary/Chest: Effort normal.  Genitourinary: Rectal exam shows external hemorrhoid. Rectal exam shows no fissure, no mass, no tenderness and anal tone normal.      Musculoskeletal: Normal range of motion.  Neurological: He is alert and oriented to person, place, and time.  Skin: Skin is warm and dry. He is not diaphoretic.  Psychiatric: He has a normal mood and affect. His behavior is normal. Judgment and thought content normal.          Assessment & Plan:   Problem List Items Addressed This Visit      Unprioritized   External hemorrhoid - Primary    Encouraged use of premoistened toilet paper, Tuck's pads. Will start Analpram. Follow up prn if symptoms are not improving.          Return if symptoms worsen or fail to improve.

## 2014-11-10 NOTE — Progress Notes (Signed)
Pre visit review using our clinic review tool, if applicable. No additional management support is needed unless otherwise documented below in the visit note. 

## 2014-11-10 NOTE — Assessment & Plan Note (Signed)
Encouraged use of premoistened toilet paper, Tuck's pads. Will start Analpram. Follow up prn if symptoms are not improving.

## 2014-11-10 NOTE — Patient Instructions (Signed)
Start using AnalPram as directed.  Use Tucks wipes after bowel movement and wipe with pre-moistened toiletpaper.  Follow up if symptoms are not improving.

## 2014-11-13 ENCOUNTER — Ambulatory Visit: Payer: Self-pay | Admitting: Internal Medicine

## 2014-11-14 ENCOUNTER — Telehealth: Payer: Self-pay | Admitting: Internal Medicine

## 2014-11-14 NOTE — Telephone Encounter (Signed)
Notified pt, Pt stated that he would discuss with you at his follow up appt next week

## 2014-11-14 NOTE — Telephone Encounter (Signed)
CT abdomen from 1/28 showed extensive diverticulosis in the colon. Mild inflammation was noted in the distal colon, which is likely from recent procedure. I would recommend follow up with GI if any persistent lower abdominal pain.  They also noted a tiny nodule in the right lung base. We will plan to repeat a CT chest to evaluate this nodule again in 1 year.

## 2014-11-18 ENCOUNTER — Other Ambulatory Visit: Payer: Self-pay | Admitting: Internal Medicine

## 2014-11-21 ENCOUNTER — Encounter: Payer: Self-pay | Admitting: Internal Medicine

## 2014-11-21 ENCOUNTER — Ambulatory Visit (INDEPENDENT_AMBULATORY_CARE_PROVIDER_SITE_OTHER): Payer: Managed Care, Other (non HMO) | Admitting: Internal Medicine

## 2014-11-21 VITALS — BP 150/82 | HR 87 | Temp 98.1°F | Ht 66.5 in | Wt 275.8 lb

## 2014-11-21 DIAGNOSIS — K644 Residual hemorrhoidal skin tags: Secondary | ICD-10-CM

## 2014-11-21 DIAGNOSIS — R938 Abnormal findings on diagnostic imaging of other specified body structures: Secondary | ICD-10-CM

## 2014-11-21 DIAGNOSIS — K5730 Diverticulosis of large intestine without perforation or abscess without bleeding: Secondary | ICD-10-CM

## 2014-11-21 DIAGNOSIS — K648 Other hemorrhoids: Secondary | ICD-10-CM

## 2014-11-21 DIAGNOSIS — R9389 Abnormal findings on diagnostic imaging of other specified body structures: Secondary | ICD-10-CM

## 2014-11-21 MED ORDER — HYDROCORTISONE ACE-PRAMOXINE 2.5-1 % RE CREA: TOPICAL_CREAM | RECTAL | Status: DC

## 2014-11-21 NOTE — Progress Notes (Signed)
Subjective:    Patient ID: Paul Cook, male    DOB: 1950/10/19, 64 y.o.   MRN: [id number]  HPI 64YO male presents for followup.   Last seen 1/25. Treated for external hemorrhoid with Analpram. Pain from hemorrhoid had improved. Some mild pain when having BM. Little or no blood on tissue with wiping. No problems with medication.  No more cramping abdominal pain. No diarrhea or other symptoms. Generally feeling well. Difficult time for him as his Elenor Legato is dying.   Wt Readings from Last 3 Encounters:  11/21/14 275 lb 12 oz (125.079 kg)  11/10/14 275 lb (124.739 kg)  10/20/14 270 lb 4 oz (122.585 kg)      Past medical, surgical, family and social history per today's encounter.  Review of Systems  Constitutional: Negative for fever, chills, activity change, appetite change, fatigue and unexpected weight change.  Eyes: Negative for visual disturbance.  Respiratory: Negative for cough and shortness of breath.   Cardiovascular: Negative for chest pain, palpitations and leg swelling.  Gastrointestinal: Positive for anal bleeding and rectal pain. Negative for nausea, vomiting, abdominal pain, diarrhea, constipation and abdominal distention.  Genitourinary: Negative for dysuria, urgency and difficulty urinating.  Musculoskeletal: Negative for arthralgias and gait problem.  Skin: Negative for color change and rash.  Hematological: Negative for adenopathy.  Psychiatric/Behavioral: Negative for sleep disturbance and dysphoric mood. The patient is not nervous/anxious.        Objective:    BP 150/82 mmHg  Pulse 87  Temp(Src) 98.1 F (36.7 C) (Oral)  Ht 5' 6.5" (1.689 m)  Wt 275 lb 12 oz (125.079 kg)  BMI 43.85 kg/m2  SpO2 95% Physical Exam  Constitutional: He is oriented to person, place, and time. He appears well-developed and well-nourished. No distress.  HENT:  Head: Normocephalic and atraumatic.  Right Ear: External ear normal.  Left Ear: External ear normal.  Nose: Nose  normal.  Mouth/Throat: Oropharynx is clear and moist. No oropharyngeal exudate.  Eyes: Conjunctivae and EOM are normal. Pupils are equal, round, and reactive to light. Right eye exhibits no discharge. Left eye exhibits no discharge. No scleral icterus.  Neck: Normal range of motion. Neck supple. No tracheal deviation present. No thyromegaly present.  Cardiovascular: Normal rate, regular rhythm and normal heart sounds.  Exam reveals no gallop and no friction rub.   No murmur heard. Pulmonary/Chest: Effort normal and breath sounds normal. No respiratory distress. He has no wheezes. He has no rales. He exhibits no tenderness.  Abdominal: Soft. Bowel sounds are normal. He exhibits no distension and no mass. There is no tenderness. There is no rebound and no guarding.  Musculoskeletal: Normal range of motion. He exhibits no edema.  Lymphadenopathy:    He has no cervical adenopathy.  Neurological: He is alert and oriented to person, place, and time. No cranial nerve deficit. Coordination normal.  Skin: Skin is warm and dry. No rash noted. He is not diaphoretic. No erythema. No pallor.  Psychiatric: He has a normal mood and affect. His behavior is normal. Judgment and thought content normal.          Assessment & Plan:   Problem List Items Addressed This Visit      Unprioritized   Abnormal CT scan, chest - Primary    Will plan repeat CT chest in 1 year, end of 2016 or early 2017.      Diverticulosis of colon without hemorrhage    Reviewed recent CT scan showing diffuse diverticulosis. Encouraged him to avoid  foods with small particles. Encouraged him to seek care immediately if any recurrent abdominal pain.      External hemorrhoid    External hemorrhoid improving by report. Will continue Analpram. Follow up prn if symptoms do not completely resolve next couple of weeks.          Return in about 3 months (around 02/19/2015) for Recheck.

## 2014-11-21 NOTE — Progress Notes (Signed)
Pre visit review using our clinic review tool, if applicable. No additional management support is needed unless otherwise documented below in the visit note. 

## 2014-11-21 NOTE — Assessment & Plan Note (Signed)
Reviewed recent CT scan showing diffuse diverticulosis. Encouraged him to avoid foods with small particles. Encouraged him to seek care immediately if any recurrent abdominal pain.

## 2014-11-21 NOTE — Assessment & Plan Note (Signed)
Will plan repeat CT chest in 1 year, end of 2016 or early 2017.

## 2014-11-21 NOTE — Patient Instructions (Signed)
Follow up 3 months

## 2014-11-21 NOTE — Assessment & Plan Note (Signed)
External hemorrhoid improving by report. Will continue Analpram. Follow up prn if symptoms do not completely resolve next couple of weeks.

## 2014-11-27 ENCOUNTER — Other Ambulatory Visit: Payer: Self-pay | Admitting: Internal Medicine

## 2014-12-03 ENCOUNTER — Encounter: Payer: Self-pay | Admitting: Internal Medicine

## 2014-12-31 ENCOUNTER — Encounter: Payer: Self-pay | Admitting: Internal Medicine

## 2015-01-02 ENCOUNTER — Encounter: Payer: Self-pay | Admitting: Internal Medicine

## 2015-01-02 ENCOUNTER — Ambulatory Visit (INDEPENDENT_AMBULATORY_CARE_PROVIDER_SITE_OTHER): Payer: Managed Care, Other (non HMO) | Admitting: Internal Medicine

## 2015-01-02 VITALS — BP 131/80 | HR 75 | Temp 98.0°F | Ht 66.5 in | Wt 275.5 lb

## 2015-01-02 DIAGNOSIS — G4700 Insomnia, unspecified: Secondary | ICD-10-CM

## 2015-01-02 DIAGNOSIS — K5730 Diverticulosis of large intestine without perforation or abscess without bleeding: Secondary | ICD-10-CM

## 2015-01-02 MED ORDER — TRAZODONE HCL 300 MG PO TABS: 300.0000 mg | ORAL_TABLET | Freq: Every day | ORAL | Status: DC

## 2015-01-02 MED ORDER — ZOLPIDEM TARTRATE 5 MG PO TABS: 5.0000 mg | ORAL_TABLET | Freq: Every evening | ORAL | Status: DC | PRN

## 2015-01-02 MED ORDER — METRONIDAZOLE 500 MG PO TABS: 500.0000 mg | ORAL_TABLET | Freq: Three times a day (TID) | ORAL | Status: DC

## 2015-01-02 MED ORDER — CIPROFLOXACIN HCL 500 MG PO TABS: 500.0000 mg | ORAL_TABLET | Freq: Two times a day (BID) | ORAL | Status: DC

## 2015-01-02 NOTE — Progress Notes (Signed)
Subjective:    Patient ID: Paul Cook, male    DOB: [birth year], 64 y.o.   MRN: [id number]  HPI  64YO male presents for acute visit.  Diverticulitis - 2/29 started having pain in abdomen. Called [facility] and diagnosed with diverticulitis. Was started on Cipro and Flagyl. Symptoms improved over 5 days. 2 days ago, developed crampy abdominal pain with increased gas, and sudden cramping pain in the left lower abdomen. These symptoms resolved. No symptoms today. No diarrhea. No NV. Tolerating a regular diet with no problems. Planning to travel to [place] next week. Concerned he may have recurrence of symptoms.  Wt Readings from Last 3 Encounters:  01/02/15 275 lb 8 oz (124.966 kg)  11/21/14 275 lb 12 oz (125.079 kg)  11/10/14 275 lb (124.739 kg)   Insomnia - Has limited use of Ambien to 5mg  most nights. Only taking Lorazepam a few times per week. Sleeping well and feels rested during daytime.  Past medical, surgical, family and social history per today's encounter.  Review of Systems  Constitutional: Negative for fever, chills, activity change, appetite change, fatigue and unexpected weight change.  Eyes: Negative for visual disturbance.  Respiratory: Negative for cough and shortness of breath.   Cardiovascular: Negative for chest pain, palpitations and leg swelling.  Gastrointestinal: Positive for abdominal pain. Negative for nausea, vomiting, constipation, abdominal distention and rectal pain.  Genitourinary: Negative for dysuria, urgency and difficulty urinating.  Musculoskeletal: Negative for arthralgias and gait problem.  Skin: Negative for color change and rash.  Hematological: Negative for adenopathy.  Psychiatric/Behavioral: Positive for sleep disturbance. Negative for dysphoric mood. The patient is not nervous/anxious.        Objective:    BP 131/80 mmHg  Pulse 75  Temp(Src) 98 F (36.7 C) (Oral)  Ht 5' 6.5" (1.689 m)  Wt 275 lb 8 oz (124.966 kg)  BMI 43.81  kg/m2  SpO2 97% Physical Exam  Constitutional: He is oriented to person, place, and time. He appears well-developed and well-nourished. No distress.  HENT:  Head: Normocephalic and atraumatic.  Right Ear: External ear normal.  Left Ear: External ear normal.  Nose: Nose normal.  Mouth/Throat: Oropharynx is clear and moist. No oropharyngeal exudate.  Eyes: Conjunctivae and EOM are normal. Pupils are equal, round, and reactive to light. Right eye exhibits no discharge. Left eye exhibits no discharge. No scleral icterus.  Neck: Normal range of motion. Neck supple. No tracheal deviation present. No thyromegaly present.  Cardiovascular: Normal rate, regular rhythm and normal heart sounds.  Exam reveals no gallop and no friction rub.   No murmur heard. Pulmonary/Chest: Effort normal and breath sounds normal. No accessory muscle usage. No tachypnea. No respiratory distress. He has no decreased breath sounds. He has no wheezes. He has no rhonchi. He has no rales. He exhibits no tenderness.  Abdominal: Soft. Bowel sounds are normal. He exhibits no distension and no mass. There is no tenderness. There is no rebound and no guarding.  Musculoskeletal: Normal range of motion. He exhibits no edema.  Lymphadenopathy:    He has no cervical adenopathy.  Neurological: He is alert and oriented to person, place, and time. No cranial nerve deficit. Coordination normal.  Skin: Skin is warm and dry. No rash noted. He is not diaphoretic. No erythema. No pallor.  Psychiatric: He has a normal mood and affect. His behavior is normal. Judgment and thought content normal.          Assessment & Plan:   Problem List Items Addressed  This Visit      Unprioritized   Diverticulosis of colon without hemorrhage - Primary    History of diverticulosis with recent flare of diverticulitis without hemorrhage. Treated with Cipro/Flagyl with improvement. Exam normal today. He is planning to travel. If any recurrent symptoms,  will start Cipro/Flagyl and clear liquid diet. Discussed signs/symptoms of diverticulitis. Follow up prn.      Insomnia    Chronic insomnia. Recently symptoms are improved and he has limited dose of Ambien. Will continue Ambien and prn Lorazepam.          Return if symptoms worsen or fail to improve.

## 2015-01-02 NOTE — Progress Notes (Signed)
Pre visit review using our clinic review tool, if applicable. No additional management support is needed unless otherwise documented below in the visit note. 

## 2015-01-02 NOTE — Patient Instructions (Signed)

## 2015-01-03 ENCOUNTER — Encounter: Payer: Self-pay | Admitting: Internal Medicine

## 2015-01-03 NOTE — Assessment & Plan Note (Signed)
History of diverticulosis with recent flare of diverticulitis without hemorrhage. Treated with Cipro/Flagyl with improvement. Exam normal today. He is planning to travel. If any recurrent symptoms, will start Cipro/Flagyl and clear liquid diet. Discussed signs/symptoms of diverticulitis. Follow up prn.

## 2015-01-03 NOTE — Assessment & Plan Note (Signed)
Chronic insomnia. Recently symptoms are improved and he has limited dose of Ambien. Will continue Ambien and prn Lorazepam.

## 2015-02-12 ENCOUNTER — Other Ambulatory Visit: Payer: Self-pay | Admitting: Internal Medicine

## 2015-02-12 NOTE — Telephone Encounter (Signed)
Rx faxed

## 2015-02-12 NOTE — Telephone Encounter (Signed)
Last refill 9.29.15.  Last OV 3.18.16.  Please advise refill

## 2015-02-20 ENCOUNTER — Encounter: Payer: Self-pay | Admitting: Internal Medicine

## 2015-02-20 ENCOUNTER — Ambulatory Visit (INDEPENDENT_AMBULATORY_CARE_PROVIDER_SITE_OTHER): Payer: Managed Care, Other (non HMO) | Admitting: Internal Medicine

## 2015-02-20 VITALS — BP 140/80 | HR 115 | Temp 99.1°F | Ht 66.5 in | Wt 272.5 lb

## 2015-02-20 DIAGNOSIS — K5732 Diverticulitis of large intestine without perforation or abscess without bleeding: Secondary | ICD-10-CM

## 2015-02-20 MED ORDER — METRONIDAZOLE 500 MG PO TABS: 500.0000 mg | ORAL_TABLET | Freq: Three times a day (TID) | ORAL | Status: DC

## 2015-02-20 MED ORDER — CIPROFLOXACIN HCL 500 MG PO TABS: 500.0000 mg | ORAL_TABLET | Freq: Two times a day (BID) | ORAL | Status: DC

## 2015-02-20 NOTE — Patient Instructions (Signed)

## 2015-02-20 NOTE — Progress Notes (Signed)
Pre visit review using our clinic review tool, if applicable. No additional management support is needed unless otherwise documented below in the visit note. 

## 2015-02-20 NOTE — Progress Notes (Signed)
Subjective:    Patient ID: Paul Cook, male    DOB: October 25, 1950, 64 y.o.   MRN: [id number]  HPI  Pt presents to the clinic today with c/o lower abdominal pain. The pain is worse with bowel movements. It is better when he lays down. He has run low grade fevers. He has had bloating but denies nausea, vomiting, diarrhea or blood in his stool. He has tried Ibuprofen and Tylenol with minimal relief. His last colonoscopy was 07/2014. He does have a history of diverticulosis. He is being followed by Dr. Gilford Cook and routinely has flare-ups of diverticulitis approximately every 3 months. His symptoms today are the same as he has had in the past with these flare-ups.  Review of Systems  Past Medical History  Diagnosis Date  . Asthma   . Depression   . GERD (gastroesophageal reflux disease)   . Hypertension   . Diverticulitis   . History of chicken pox     Current Outpatient Prescriptions  Medication Sig Dispense Refill  . albuterol (PROVENTIL HFA;VENTOLIN HFA) 108 (90 BASE) MCG/ACT inhaler Inhale 2 puffs into the lungs every 6 (six) hours as needed for wheezing or shortness of breath. 1 Inhaler 0  . ciprofloxacin (CIPRO) 500 MG tablet Take 1 tablet (500 mg total) by mouth 2 (two) times daily. 28 tablet 0  . gabapentin (NEURONTIN) 600 MG tablet Take 1 tablet (600 mg total) by mouth 3 (three) times daily. 270 tablet 3  . hydrocortisone-pramoxine (ANALPRAM-HC) 2.5-1 % rectal cream APPLY RECTALLY THREE TIMES DAILY 30 g 3  . hyoscyamine (LEVSIN SL) 0.125 MG SL tablet TAKE 1 TABLETUNDER THE TONGUE EVERY 4 HOURS AS NEEDED FOR CRAMPING 30 tablet 0  . lisinopril-hydrochlorothiazide (PRINZIDE,ZESTORETIC) 20-25 MG per tablet Take 1 tablet by mouth daily. 90 tablet 3  . LORazepam (ATIVAN) 0.5 MG tablet TAKE 1 TO 2 TABLETS BY MOUTH AT BEDTIME 60 tablet 0  . metroNIDAZOLE (FLAGYL) 500 MG tablet Take 1 tablet (500 mg total) by mouth 3 (three) times daily. 42 tablet 0  . mometasone (NASONEX) 50 MCG/ACT nasal  spray Place 2 sprays into the nose daily. 51 g 3  . nystatin (MYCOSTATIN/NYSTOP) 100000 UNIT/GM POWD Apply powder to groin twice daily 60 g 6  . nystatin cream (MYCOSTATIN) Apply 1 application topically 2 (two) times daily. 30 g 6  . ranitidine (ZANTAC) 150 MG tablet Take 1 tablet (150 mg total) by mouth 2 (two) times daily. 180 tablet 3  . traZODone (DESYREL) 300 MG tablet Take 1 tablet (300 mg total) by mouth at bedtime. 90 tablet 1  . zolpidem (AMBIEN) 5 MG tablet Take 1-2 tablets (5-10 mg total) by mouth at bedtime as needed. for sleep 60 tablet 1   No current facility-administered medications for this visit.    Allergies  Allergen Reactions  . Nsaids Other (See Comments)    GI Issues    Family History  Problem Relation Age of Onset  . Depression Mother   . Hypertension Father   . Heart disease Father   . Stroke Father   . Cancer Maternal Grandmother     ovarian?  . Cancer Paternal Grandfather     prostate    History   Social History  . Marital Status: Married    Spouse Name: N/A  . Number of Children: N/A  . Years of Education: N/A   Occupational History  . Not on file.   Social History Main Topics  . Smoking status: Never Smoker   .  Smokeless tobacco: Never Used  . Alcohol Use: No  . Drug Use: No  . Sexual Activity: Not on file   Other Topics Concern  . Not on file   Social History Narrative   Lives in [place] with wife, Paul Cook. 1 daughter in [place].      Work - Optician, dispensing, now Animal nutritionist                 Constitutional: Denies fever, malaise, fatigue, headache or abrupt weight changes.  Respiratory: Denies difficulty breathing, shortness of breath, cough or sputum production.   Cardiovascular: Denies chest pain, chest tightness, palpitations or swelling in the hands or feet.  Gastrointestinal: Pt reports abdominal pain, bloating. Denies constipation, diarrhea or blood in the stool. .  Skin: Denies redness, rashes, lesions or  ulcercations.    No other specific complaints in a complete review of systems (except as listed in HPI above).     Objective:   Physical Exam   BP 140/80 mmHg  Pulse 115  Temp(Src) 99.1 F (37.3 C) (Oral)  Ht 5' 6.5" (1.689 m)  Wt 272 lb 8 oz (123.605 kg)  BMI 43.33 kg/m2 Wt Readings from Last 3 Encounters:  02/20/15 272 lb 8 oz (123.605 kg)  01/02/15 275 lb 8 oz (124.966 kg)  11/21/14 275 lb 12 oz (125.079 kg)    General: Appears their stated age, obese in NAD. Skin: Warm, dry and intact. No rashes, lesions or ulcerations noted. Cardiovascular: Normal rate and rhythm. S1,S2 noted.   Pulmonary/Chest: Normal effort and positive vesicular breath sounds. No respiratory distress. No wheezes, rales or ronchi noted.  Abdomen: Soft and tender in the LLQ. Normal bowel sounds, no bruits noted. Mild distention but no masses noted.   BMET    Component Value Date/Time   NA 137 10/20/2014 1624   NA 140 09/05/2014 1312   K 4.0 10/20/2014 1624   K 3.8 09/05/2014 1312   CL 102 10/20/2014 1624   CL 106 09/05/2014 1312   CO2 26 10/20/2014 1624   CO2 28 09/05/2014 1312   GLUCOSE 105* 10/20/2014 1624   GLUCOSE 77 09/05/2014 1312   BUN 19 10/20/2014 1624   BUN 18 09/05/2014 1312   CREATININE 1.0 10/20/2014 1624   CREATININE 1.04 09/05/2014 1312   CALCIUM 8.7 10/20/2014 1624   CALCIUM 8.3* 09/05/2014 1312    Lipid Panel     Component Value Date/Time   CHOL 191 04/15/2014 0803   TRIG 113.0 04/15/2014 0803   HDL 50.60 04/15/2014 0803   CHOLHDL 4 04/15/2014 0803   VLDL 22.6 04/15/2014 0803   LDLCALC 118* 04/15/2014 0803    CBC    Component Value Date/Time   WBC 12.4* 10/20/2014 1624   WBC 12.4* 09/05/2014 1312   RBC 4.59 10/20/2014 1624   RBC 4.57 09/05/2014 1312   HGB 13.6 10/20/2014 1624   HGB 13.6 09/05/2014 1312   HCT 41.1 10/20/2014 1624   HCT 40.8 09/05/2014 1312   PLT 222.0 10/20/2014 1624   PLT 234 09/05/2014 1312   MCV 89.7 10/20/2014 1624   MCV 89  09/05/2014 1312   MCH 29.7 09/05/2014 1312   MCHC 33.1 10/20/2014 1624   MCHC 33.3 09/05/2014 1312   RDW 13.9 10/20/2014 1624   RDW 13.7 09/05/2014 1312   LYMPHSABS 2.0 10/20/2014 1624   LYMPHSABS 2.6 09/05/2014 1312   MONOABS 0.6 10/20/2014 1624   MONOABS 0.9 09/05/2014 1312   EOSABS 0.1 10/20/2014 1624   EOSABS 0.1 09/05/2014 1312  BASOSABS 0.0 10/20/2014 1624   BASOSABS 0.1 09/05/2014 1312    Hgb A1C Lab Results  Component Value Date   HGBA1C 5.7 04/15/2014        Assessment & Plan:   Diverticulitis:  Discussed clear liquid diet for now eRx for Cipro an Flagyl Tylenol as needed for pain Discussed once symptoms resolve, starting a high fiber diet Discussed foods to avoid, handout given  RTC as needed or if symptoms persist or worsen

## 2015-02-27 ENCOUNTER — Ambulatory Visit: Payer: Managed Care, Other (non HMO) | Admitting: Internal Medicine

## 2015-03-09 ENCOUNTER — Ambulatory Visit (INDEPENDENT_AMBULATORY_CARE_PROVIDER_SITE_OTHER): Payer: Managed Care, Other (non HMO) | Admitting: Internal Medicine

## 2015-03-09 ENCOUNTER — Encounter: Payer: Self-pay | Admitting: Internal Medicine

## 2015-03-09 VITALS — BP 127/77 | HR 76 | Temp 98.1°F | Ht 66.5 in | Wt 274.5 lb

## 2015-03-09 DIAGNOSIS — K5732 Diverticulitis of large intestine without perforation or abscess without bleeding: Secondary | ICD-10-CM | POA: Insufficient documentation

## 2015-03-09 DIAGNOSIS — I10 Essential (primary) hypertension: Secondary | ICD-10-CM

## 2015-03-09 DIAGNOSIS — G4700 Insomnia, unspecified: Secondary | ICD-10-CM | POA: Diagnosis not present

## 2015-03-09 MED ORDER — METRONIDAZOLE 500 MG PO TABS: 500.0000 mg | ORAL_TABLET | Freq: Three times a day (TID) | ORAL | Status: DC

## 2015-03-09 MED ORDER — CIPROFLOXACIN HCL 500 MG PO TABS: 500.0000 mg | ORAL_TABLET | Freq: Two times a day (BID) | ORAL | Status: DC

## 2015-03-09 NOTE — Patient Instructions (Signed)
Labs today

## 2015-03-09 NOTE — Assessment & Plan Note (Signed)
BP Readings from Last 3 Encounters:  03/09/15 127/77  02/20/15 140/80  01/02/15 131/80   BP well controlled. Continue current medications.

## 2015-03-09 NOTE — Assessment & Plan Note (Signed)
Encouraged him to limit to Ambien 5mg  at bedtime.

## 2015-03-09 NOTE — Progress Notes (Signed)
Pre visit review using our clinic review tool, if applicable. No additional management support is needed unless otherwise documented below in the visit note. 

## 2015-03-09 NOTE — Progress Notes (Signed)
Subjective:    Patient ID: Paul Cook, male    DOB: 21-Jun-1951, 64 y.o.   MRN: [id number]  HPI  64YO male presents for follow up.  Recent episode of diverticulitis. Treated with CIpro and Flagyl. Symptoms improved.  Notes some bloating with eating vegetables. Tried taking Beano with some improvement.  Trying to avoid nuts and other foods that trigger symptoms. No current abdominal pain, diarrhea, bloody stool. No NV.   Wt Readings from Last 3 Encounters:  03/09/15 274 lb 8 oz (124.512 kg)  02/20/15 272 lb 8 oz (123.605 kg)  01/02/15 275 lb 8 oz (124.966 kg)    Insomnia - Recently had some insomnia with Ambien 5mg  so added Lorazepam for about 3 days with improvement. Now back to taking Ambien 5mg  daily.   Past medical, surgical, family and social history per today's encounter.  Review of Systems  Constitutional: Negative for fever, chills, activity change, appetite change, fatigue and unexpected weight change.  Eyes: Negative for visual disturbance.  Respiratory: Negative for cough and shortness of breath.   Cardiovascular: Negative for chest pain, palpitations and leg swelling.  Gastrointestinal: Negative for nausea, vomiting, abdominal pain, diarrhea, constipation, blood in stool and abdominal distention.  Genitourinary: Negative for dysuria, urgency and difficulty urinating.  Musculoskeletal: Negative for arthralgias and gait problem.  Skin: Negative for color change and rash.  Hematological: Negative for adenopathy.  Psychiatric/Behavioral: Positive for sleep disturbance. Negative for dysphoric mood. The patient is not nervous/anxious.        Objective:    BP 127/77 mmHg  Pulse 76  Temp(Src) 98.1 F (36.7 C) (Oral)  Ht 5' 6.5" (1.689 m)  Wt 274 lb 8 oz (124.512 kg)  BMI 43.65 kg/m2  SpO2 96% Physical Exam  Constitutional: He is oriented to person, place, and time. He appears well-developed and well-nourished. No distress.  HENT:  Head: Normocephalic and  atraumatic.  Right Ear: External ear normal.  Left Ear: External ear normal.  Nose: Nose normal.  Mouth/Throat: Oropharynx is clear and moist. No oropharyngeal exudate.  Eyes: Conjunctivae and EOM are normal. Pupils are equal, round, and reactive to light. Right eye exhibits no discharge. Left eye exhibits no discharge. No scleral icterus.  Neck: Normal range of motion. Neck supple. No tracheal deviation present. No thyromegaly present.  Cardiovascular: Normal rate, regular rhythm and normal heart sounds.  Exam reveals no gallop and no friction rub.   No murmur heard. Pulmonary/Chest: Effort normal and breath sounds normal. No respiratory distress. He has no wheezes. He has no rales. He exhibits no tenderness.  Abdominal: Soft. Bowel sounds are normal. He exhibits no distension and no mass. There is tenderness (mild RLQ). There is no rebound and no guarding.  Musculoskeletal: Normal range of motion. He exhibits no edema.  Lymphadenopathy:    He has no cervical adenopathy.  Neurological: He is alert and oriented to person, place, and time. No cranial nerve deficit. Coordination normal.  Skin: Skin is warm and dry. No rash noted. He is not diaphoretic. No erythema. No pallor.  Psychiatric: He has a normal mood and affect. His behavior is normal. Judgment and thought content normal.          Assessment & Plan:   Problem List Items Addressed This Visit      Unprioritized   Diverticulitis of colon without hemorrhage - Primary    Recent episode of diverticulitis, improving. Will continue to monitor. If recurrent symptoms over the next 3 months, discussed repeating CT abdomen and  referral back to GI/Colorectal surgery to discuss possible intervention including partial colectomy. Follow up in 3 months and prn. CBC and CMP today.      Relevant Medications   metroNIDAZOLE (FLAGYL) 500 MG tablet   ciprofloxacin (CIPRO) 500 MG tablet   Other Relevant Orders   Comprehensive metabolic panel    CBC with Differential/Platelet   Essential hypertension, benign    BP Readings from Last 3 Encounters:  03/09/15 127/77  02/20/15 140/80  01/02/15 131/80   BP well controlled. Continue current medications.      Insomnia    Encouraged him to limit to Ambien 5mg  at bedtime.      Severe obesity (BMI >= 40)    Wt Readings from Last 3 Encounters:  03/09/15 274 lb 8 oz (124.512 kg)  02/20/15 272 lb 8 oz (123.605 kg)  01/02/15 275 lb 8 oz (124.966 kg)   Body mass index is 43.65 kg/(m^2). The patient is asked to make an attempt to improve diet and exercise patterns to aid in medical management of this problem.           Return in about 3 months (around 06/09/2015) for Recheck.

## 2015-03-09 NOTE — Assessment & Plan Note (Signed)
Recent episode of diverticulitis, improving. Will continue to monitor. If recurrent symptoms over the next 3 months, discussed repeating CT abdomen and referral back to GI/Colorectal surgery to discuss possible intervention including partial colectomy. Follow up in 3 months and prn. CBC and CMP today.

## 2015-03-09 NOTE — Assessment & Plan Note (Signed)
Wt Readings from Last 3 Encounters:  03/09/15 274 lb 8 oz (124.512 kg)  02/20/15 272 lb 8 oz (123.605 kg)  01/02/15 275 lb 8 oz (124.966 kg)   Body mass index is 43.65 kg/(m^2). The patient is asked to make an attempt to improve diet and exercise patterns to aid in medical management of this problem.

## 2015-03-10 LAB — COMPREHENSIVE METABOLIC PANEL
ALT: 26 U/L (ref 0–53)
AST: 25 U/L (ref 0–37)
Albumin: 4.3 g/dL (ref 3.5–5.2)
Alkaline Phosphatase: 51 U/L (ref 39–117)
BUN: 21 mg/dL (ref 6–23)
CO2: 32 mEq/L (ref 19–32)
Calcium: 9 mg/dL (ref 8.4–10.5)
Chloride: 100 mEq/L (ref 96–112)
Creatinine, Ser: 1.01 mg/dL (ref 0.40–1.50)
GFR: 79.17 mL/min (ref >60.00)
Glucose, Bld: 120 mg/dL — ABNORMAL HIGH (ref 70–99)
Potassium: 4.2 mEq/L (ref 3.5–5.1)
Sodium: 137 mEq/L (ref 135–145)
Total Bilirubin: 0.4 mg/dL (ref 0.2–1.2)
Total Protein: 6.9 g/dL (ref 6.0–8.3)

## 2015-03-10 LAB — CBC WITH DIFFERENTIAL/PLATELET
Basophils Absolute: 0.1 10*3/uL (ref 0.0–0.1)
Basophils Relative: 1 % (ref 0.0–3.0)
Eosinophils Absolute: 0.1 10*3/uL (ref 0.0–0.7)
Eosinophils Relative: 0.8 % (ref 0.0–5.0)
HCT: 41.6 % (ref 39.0–52.0)
Hemoglobin: 14.2 g/dL (ref 13.0–17.0)
Lymphocytes Relative: 21.5 % (ref 12.0–46.0)
Lymphs Abs: 2.5 10*3/uL (ref 0.7–4.0)
MCHC: 34.2 g/dL (ref 30.0–36.0)
MCV: 86.8 fl (ref 78.0–100.0)
Monocytes Absolute: 0.7 10*3/uL (ref 0.1–1.0)
Monocytes Relative: 6.3 % (ref 3.0–12.0)
Neutro Abs: 8.3 10*3/uL — ABNORMAL HIGH (ref 1.4–7.7)
Neutrophils Relative %: 70.4 % (ref 43.0–77.0)
Platelets: 225 10*3/uL (ref 150.0–400.0)
RBC: 4.79 Mil/uL (ref 4.22–5.81)
RDW: 14.2 % (ref 11.5–15.5)
WBC: 11.9 10*3/uL — ABNORMAL HIGH (ref 4.0–10.5)

## 2015-03-12 ENCOUNTER — Telehealth: Payer: Self-pay | Admitting: Internal Medicine

## 2015-03-12 NOTE — Telephone Encounter (Signed)
Pt called wanting to know the results of his lab results from 5.23.16  03/12/15 maf

## 2015-03-12 NOTE — Telephone Encounter (Signed)
Message was sent through [facility] showed that white blood cell count was slightly improved. Kidney and liver function were normal.

## 2015-03-13 ENCOUNTER — Encounter: Payer: Self-pay | Admitting: *Deleted

## 2015-03-17 ENCOUNTER — Other Ambulatory Visit: Payer: Self-pay | Admitting: Internal Medicine

## 2015-03-17 ENCOUNTER — Telehealth: Payer: Self-pay | Admitting: Internal Medicine

## 2015-03-17 DIAGNOSIS — K5732 Diverticulitis of large intestine without perforation or abscess without bleeding: Secondary | ICD-10-CM

## 2015-03-17 NOTE — Progress Notes (Signed)
4:30pm on Thursday?

## 2015-03-17 NOTE — Telephone Encounter (Signed)
Pt called stating he is still having issues with his diverticulitis. Pt requesting CT scan.. Please advise Pt. AC

## 2015-03-17 NOTE — Telephone Encounter (Signed)
Needs to be seen 67min visit. I will place order for CT scan today, then let's get him in tomorrow.

## 2015-03-17 NOTE — Progress Notes (Signed)
Pt is scheduled to have CT at 10am tomorrow

## 2015-03-18 ENCOUNTER — Ambulatory Visit
Admission: RE | Admit: 2015-03-18 | Discharge: 2015-03-18 | Disposition: A | Discharge status: Home / Self Care | Payer: Managed Care, Other (non HMO) | Source: Ambulatory Visit | Attending: Internal Medicine | Admitting: Internal Medicine

## 2015-03-18 ENCOUNTER — Other Ambulatory Visit: Payer: Managed Care, Other (non HMO)

## 2015-03-18 DIAGNOSIS — K5732 Diverticulitis of large intestine without perforation or abscess without bleeding: Secondary | ICD-10-CM

## 2015-03-18 DIAGNOSIS — R102 Pelvic and perineal pain: Secondary | ICD-10-CM | POA: Diagnosis not present

## 2015-03-18 DIAGNOSIS — K5730 Diverticulosis of large intestine without perforation or abscess without bleeding: Secondary | ICD-10-CM | POA: Diagnosis present

## 2015-03-18 DIAGNOSIS — I10 Essential (primary) hypertension: Secondary | ICD-10-CM | POA: Insufficient documentation

## 2015-03-18 DIAGNOSIS — N281 Cyst of kidney, acquired: Secondary | ICD-10-CM | POA: Insufficient documentation

## 2015-03-18 MED ORDER — IOHEXOL 350 MG/ML SOLN
100.0000 mL | Freq: Once | INTRAVENOUS | Status: AC | PRN
Start: 2015-03-18 — End: 2015-03-18
  Administered 2015-03-18: 100 mL via INTRAVENOUS

## 2015-03-18 NOTE — Progress Notes (Signed)
Pt scheduled  

## 2015-03-18 NOTE — Telephone Encounter (Signed)
Patient scheduled for 4:30 on 6/2 - confirmed with pt

## 2015-03-19 ENCOUNTER — Encounter: Payer: Self-pay | Admitting: Internal Medicine

## 2015-03-19 ENCOUNTER — Ambulatory Visit (INDEPENDENT_AMBULATORY_CARE_PROVIDER_SITE_OTHER): Payer: Managed Care, Other (non HMO) | Admitting: Internal Medicine

## 2015-03-19 VITALS — BP 132/76 | HR 72 | Temp 98.5°F | Resp 12 | Ht 66.5 in | Wt 277.2 lb

## 2015-03-19 DIAGNOSIS — G2581 Restless legs syndrome: Secondary | ICD-10-CM | POA: Diagnosis not present

## 2015-03-19 DIAGNOSIS — K5732 Diverticulitis of large intestine without perforation or abscess without bleeding: Secondary | ICD-10-CM

## 2015-03-19 MED ORDER — METRONIDAZOLE 500 MG PO TABS: 500.0000 mg | ORAL_TABLET | Freq: Three times a day (TID) | ORAL | Status: DC

## 2015-03-19 MED ORDER — CIPROFLOXACIN HCL 500 MG PO TABS: 500.0000 mg | ORAL_TABLET | Freq: Two times a day (BID) | ORAL | Status: DC

## 2015-03-19 MED ORDER — ROPINIROLE HCL 0.25 MG PO TABS
0.2500 mg | ORAL_TABLET | Freq: Every day | ORAL | Status: DC
Start: 2015-03-19 — End: 2015-03-31

## 2015-03-19 NOTE — Progress Notes (Signed)
Pre visit review using our clinic review tool, if applicable. No additional management support is needed unless otherwise documented below in the visit note. 

## 2015-03-19 NOTE — Progress Notes (Signed)
Subjective:    Patient ID: Paul Cook, male    DOB: 10-Apr-1951, 64 y.o.   MRN: [id number]  HPI  63YO male presents for acute visit.  Started having abdominal pain, described as sharp cramps, this Monday. Started Cipro and Flagyl on Tuesday. No persistent pain, it would come and goes. No diarrhea or blood in stool. Limited dietary intake, more soup.  Pain has resolved. CT abdomen performed yesterday showed divericulosis in descending and sigmoid colon and possible small area of diverticulitis in sigmoid colon.  Also concerned about restless legs. Taking Ativan1mg   at night with some persistent symptoms.   Past medical, surgical, family and social history per today's encounter.  Review of Systems  Constitutional: Negative for fever, chills, activity change, appetite change, fatigue and unexpected weight change.  Eyes: Negative for visual disturbance.  Respiratory: Negative for cough and shortness of breath.   Cardiovascular: Negative for chest pain, palpitations and leg swelling.  Gastrointestinal: Positive for abdominal pain. Negative for nausea, vomiting, diarrhea, constipation, blood in stool and abdominal distention.  Genitourinary: Negative for dysuria, urgency and difficulty urinating.  Musculoskeletal: Negative for arthralgias and gait problem.  Skin: Negative for color change and rash.  Hematological: Negative for adenopathy.  Psychiatric/Behavioral: Negative for sleep disturbance and dysphoric mood. The patient is not nervous/anxious.        Objective:    BP 132/76 mmHg  Pulse 72  Temp(Src) 98.5 F (36.9 C) (Oral)  Resp 12  Ht 5' 6.5" (1.689 m)  Wt 277 lb 4 oz (125.76 kg)  BMI 44.08 kg/m2  SpO2 98% Physical Exam  Constitutional: He is oriented to person, place, and time. He appears well-developed and well-nourished. No distress.  HENT:  Head: Normocephalic and atraumatic.  Right Ear: External ear normal.  Left Ear: External ear normal.  Nose: Nose normal.    Mouth/Throat: Oropharynx is clear and moist. No oropharyngeal exudate.  Eyes: Conjunctivae and EOM are normal. Pupils are equal, round, and reactive to light. Right eye exhibits no discharge. Left eye exhibits no discharge. No scleral icterus.  Neck: Normal range of motion. Neck supple. No tracheal deviation present. No thyromegaly present.  Cardiovascular: Normal rate, regular rhythm and normal heart sounds.  Exam reveals no gallop and no friction rub.   No murmur heard. Pulmonary/Chest: Effort normal and breath sounds normal. No respiratory distress. He has no wheezes. He has no rales. He exhibits no tenderness.  Abdominal: Soft. Bowel sounds are normal. He exhibits no distension and no mass. There is no tenderness. There is no rebound and no guarding.  Musculoskeletal: Normal range of motion. He exhibits no edema.  Lymphadenopathy:    He has no cervical adenopathy.  Neurological: He is alert and oriented to person, place, and time. No cranial nerve deficit. Coordination normal.  Skin: Skin is warm and dry. No rash noted. He is not diaphoretic. No erythema. No pallor.  Psychiatric: He has a normal mood and affect. His behavior is normal. Judgment and thought content normal.          Assessment & Plan:   Problem List Items Addressed This Visit      Unprioritized   Diverticulitis of colon without hemorrhage - Primary    Symptoms and CT c/w diverticulitis. Reviewed CT with pt today. Symptomatically improving with Cipro and Flagyl. Given frequent recurrent bouts of diverticulitis, will set up GI evaluation. Question if any other intervention might be helpful, including colectomy. Follow up in 4 weeks.      Relevant  Medications   ciprofloxacin (CIPRO) 500 MG tablet   metroNIDAZOLE (FLAGYL) 500 MG tablet   Other Relevant Orders   Ambulatory referral to Gastroenterology   Restless legs    No improvement with Lorazepam. Will try adding Requip. Discussed potential side effects of this  medication. Follow up in 4 weeks and prn.          Return in about 4 weeks (around 04/16/2015) for Recheck.

## 2015-03-19 NOTE — Patient Instructions (Addendum)
We will set up evaluation with gastroenterology. Please complete course of cipro and flagyl.  Start Requip 0.25mg  at bedtime to help with restless legs.  Diverticulitis Diverticulitis is inflammation or infection of small pouches in your colon that form when you have a condition called diverticulosis. The pouches in your colon are called diverticula. Your colon, or large intestine, is where water is absorbed and stool is formed. Complications of diverticulitis can include:  Bleeding.  Severe infection.  Severe pain.  Perforation of your colon.  Obstruction of your colon. CAUSES  Diverticulitis is caused by bacteria. Diverticulitis happens when stool becomes trapped in diverticula. This allows bacteria to grow in the diverticula, which can lead to inflammation and infection. RISK FACTORS People with diverticulosis are at risk for diverticulitis. Eating a diet that does not include enough fiber from fruits and vegetables may make diverticulitis more likely to develop. SYMPTOMS  Symptoms of diverticulitis may include:  Abdominal pain and tenderness. The pain is normally located on the left side of the abdomen, but may occur in other areas.  Fever and chills.  Bloating.  Cramping.  Nausea.  Vomiting.  Constipation.  Diarrhea.  Blood in your stool. DIAGNOSIS  Your health care provider will ask you about your medical history and do a physical exam. You may need to have tests done because many medical conditions can cause the same symptoms as diverticulitis. Tests may include:  Blood tests.  Urine tests.  Imaging tests of the abdomen, including X-rays and CT scans. When your condition is under control, your health care provider may recommend that you have a colonoscopy. A colonoscopy can show how severe your diverticula are and whether something else is causing your symptoms. TREATMENT  Most cases of diverticulitis are mild and can be treated at home. Treatment may  include:  Taking over-the-counter pain medicines.  Following a clear liquid diet.  Taking antibiotic medicines by mouth for 7-10 days. More severe cases may be treated at a hospital. Treatment may include:  Not eating or drinking.  Taking prescription pain medicine.  Receiving antibiotic medicines through an IV tube.  Receiving fluids and nutrition through an IV tube.  Surgery. HOME CARE INSTRUCTIONS   Follow your health care provider's instructions carefully.  Follow a full liquid diet or other diet as directed by your health care provider. After your symptoms improve, your health care provider may tell you to change your diet. He or she may recommend you eat a high-fiber diet. Fruits and vegetables are good sources of fiber. Fiber makes it easier to pass stool.  Take fiber supplements or probiotics as directed by your health care provider.  Only take medicines as directed by your health care provider.  Keep all your follow-up appointments. SEEK MEDICAL CARE IF:   Your pain does not improve.  You have a hard time eating food.  Your bowel movements do not return to normal. SEEK IMMEDIATE MEDICAL CARE IF:   Your pain becomes worse.  Your symptoms do not get better.  Your symptoms suddenly get worse.  You have a fever.  You have repeated vomiting.  You have bloody or black, tarry stools. MAKE SURE YOU:   Understand these instructions.  Will watch your condition.  Will get help right away if you are not doing well or get worse. Document Released: 07/13/2005 Document Revised: 10/08/2013 Document Reviewed: 08/28/2013 [facility] Patient Information 2015 [place], [place]. This information is not intended to replace advice given to you by your health care provider. Make  sure you discuss any questions you have with your health care provider.  

## 2015-03-19 NOTE — Assessment & Plan Note (Signed)
Symptoms and CT c/w diverticulitis. Reviewed CT with pt today. Symptomatically improving with Cipro and Flagyl. Given frequent recurrent bouts of diverticulitis, will set up GI evaluation. Question if any other intervention might be helpful, including colectomy. Follow up in 4 weeks.

## 2015-03-19 NOTE — Assessment & Plan Note (Signed)
No improvement with Lorazepam. Will try adding Requip. Discussed potential side effects of this medication. Follow up in 4 weeks and prn.

## 2015-03-25 ENCOUNTER — Telehealth: Payer: Self-pay | Admitting: Internal Medicine

## 2015-03-25 NOTE — Telephone Encounter (Signed)
Pt wanted to let Dr. Gilford Rile know the medication that he was given for restless leg worked for a few days and now it is not helping.msn

## 2015-03-25 NOTE — Telephone Encounter (Signed)
Please have him increase to Requip 0.5mg  at bedtime.

## 2015-03-25 NOTE — Telephone Encounter (Signed)
Notified pt. 

## 2015-03-25 NOTE — Telephone Encounter (Signed)
Pt request a return called about referral appt/msn

## 2015-03-25 NOTE — Telephone Encounter (Signed)
Please see below note

## 2015-03-30 ENCOUNTER — Other Ambulatory Visit: Payer: Self-pay | Admitting: Internal Medicine

## 2015-03-30 ENCOUNTER — Other Ambulatory Visit: Payer: Self-pay | Admitting: *Deleted

## 2015-03-30 DIAGNOSIS — M5010 Cervical disc disorder with radiculopathy, unspecified cervical region: Secondary | ICD-10-CM

## 2015-03-30 MED ORDER — GABAPENTIN 600 MG PO TABS: 600.0000 mg | ORAL_TABLET | Freq: Three times a day (TID) | ORAL | Status: DC

## 2015-03-30 NOTE — Telephone Encounter (Signed)
Last Ov and refill 6.2.16.  Please advise refill

## 2015-03-30 NOTE — Telephone Encounter (Signed)
Is he having another flare of diverticulitis? We just filled these antibiotics on 6/2??

## 2015-03-31 ENCOUNTER — Telehealth: Payer: Self-pay

## 2015-03-31 MED ORDER — ROPINIROLE HCL 0.25 MG PO TABS: 0.2500 mg | ORAL_TABLET | Freq: Two times a day (BID) | ORAL | Status: DC

## 2015-03-31 NOTE — Telephone Encounter (Signed)
The pt called is hoping to get his medication Requip refilled - but he is hoping to start taking two pills at a time (instead of the one pill he is taking now)

## 2015-03-31 NOTE — Telephone Encounter (Signed)
OK. Fine to fill, but he needs to let us know if he is having symptoms of a flare up.

## 2015-03-31 NOTE — Telephone Encounter (Signed)
Pt states that he is not currently having a flare up.  Pt is concerned about having a flare up and would like the medication ready at the pharmacy in case he needs it.

## 2015-03-31 NOTE — Telephone Encounter (Signed)
Per previous phone note, Dr Gilford Rile has already increased pt's medication to 2 (two) 0.25mg  tablets daily.  Pt is requesting a new rx to be sent to the pharmacy with these directions and increase in tablets.

## 2015-04-02 ENCOUNTER — Other Ambulatory Visit: Payer: Self-pay | Admitting: *Deleted

## 2015-04-02 MED ORDER — TRAZODONE HCL 300 MG PO TABS: 300.0000 mg | ORAL_TABLET | Freq: Every day | ORAL | Status: DC

## 2015-04-03 ENCOUNTER — Other Ambulatory Visit: Payer: Self-pay | Admitting: Internal Medicine

## 2015-04-03 NOTE — Telephone Encounter (Signed)
Last Ov 6.2.16.  Please advise refill

## 2015-04-06 NOTE — Telephone Encounter (Signed)
rx faxed

## 2015-04-17 ENCOUNTER — Telehealth: Payer: Self-pay | Admitting: *Deleted

## 2015-04-17 MED ORDER — ROPINIROLE HCL 0.25 MG PO TABS: ORAL_TABLET | ORAL | Status: DC

## 2015-04-17 NOTE — Telephone Encounter (Signed)
In reviewing medications, he is on ambien, trazodone and lorazepam and now requip.  Make sure not ingesting an increased amount of caffeine, etc.  I can refill the .25mg  tablets - have him try three and see if this works just as well.  (.25mg ) three per day.   Needs appt with Dr Gilford Rile for f/u.  If this does not work, f/u with someone in office.

## 2015-04-17 NOTE — Telephone Encounter (Signed)
Refilled requip .25mg  #90 with no refills.

## 2015-04-17 NOTE — Telephone Encounter (Signed)
Spoke with pt, he states he does not drink caffeine.  He states he will try the 3 0.25mg  tablets.  He further states he will call the first of the week if the 3 tablets is not effective.

## 2015-04-17 NOTE — Telephone Encounter (Signed)
Johnsie Cancel, CMA at 04/17/2015 2:41 PM    Status: Signed      Expand All Collapse All    Spoke with pt, he states he does not drink caffeine. He states he will try the 3 0.25mg  tablets. He further states he will call the first of the week if the 3 tablets is not effective.  Please advise refill

## 2015-04-17 NOTE — Telephone Encounter (Signed)
Pt called states the 0.25mg  2 tablets daily is not effective.  He states he takes the 2 tablets then he has to take 2 additional tablets as he is awaken during the night with restless leg.  Pt is requesting an increase to 0.25mg  4 tablets daily instead of 2 tablets daily.  Please advise in Dr Derry Skill absence.

## 2015-04-17 NOTE — Telephone Encounter (Signed)
Spoke with pt advised Rx sent 

## 2015-05-04 ENCOUNTER — Telehealth: Payer: Self-pay

## 2015-05-04 NOTE — Telephone Encounter (Signed)
The pt needs to be called back regarding his referral.

## 2015-05-06 ENCOUNTER — Telehealth: Payer: Self-pay | Admitting: *Deleted

## 2015-05-06 NOTE — Telephone Encounter (Signed)
Left message on VM that someone will call to schedule CT

## 2015-05-06 NOTE — Telephone Encounter (Signed)
Pt called states his GI MD in [place] is requesting another CT Abd Pelvis due to low abd pain returning.  Pt states Dr Sherral Hammers wants the CT while pt is having a pain flare.  Please advise order

## 2015-05-06 NOTE — Telephone Encounter (Signed)
I have seen the request this morning from Dr. Clydene Laming. I will give this to Melissa to see if she can set up the CT scan for Dr. Clydene Laming.

## 2015-05-07 ENCOUNTER — Other Ambulatory Visit: Payer: Self-pay | Admitting: Internal Medicine

## 2015-05-07 NOTE — Telephone Encounter (Signed)
Spoke with pt, he states he is requesting the Cipro refill because of a Diverticulitis flare he is having.  Please advise refill

## 2015-05-07 NOTE — Telephone Encounter (Signed)
Left detailed message on VM that he needs appoint.

## 2015-05-07 NOTE — Telephone Encounter (Signed)
Needs to be seen if having new symptoms of pain/diarrhea

## 2015-05-15 ENCOUNTER — Other Ambulatory Visit: Payer: Self-pay | Admitting: Internal Medicine

## 2015-05-15 NOTE — Telephone Encounter (Signed)
Last OV 6/16 ok to fill? 

## 2015-05-26 ENCOUNTER — Encounter: Payer: Self-pay | Admitting: Internal Medicine

## 2015-05-26 ENCOUNTER — Ambulatory Visit (INDEPENDENT_AMBULATORY_CARE_PROVIDER_SITE_OTHER): Payer: Managed Care, Other (non HMO) | Admitting: Internal Medicine

## 2015-05-26 VITALS — BP 136/70 | HR 80 | Temp 97.8°F | Wt 276.0 lb

## 2015-05-26 DIAGNOSIS — G2581 Restless legs syndrome: Secondary | ICD-10-CM

## 2015-05-26 DIAGNOSIS — K5732 Diverticulitis of large intestine without perforation or abscess without bleeding: Secondary | ICD-10-CM | POA: Diagnosis not present

## 2015-05-26 DIAGNOSIS — G4700 Insomnia, unspecified: Secondary | ICD-10-CM

## 2015-05-26 MED ORDER — BUPROPION HCL ER (XL) 150 MG PO TB24: 150.0000 mg | ORAL_TABLET | Freq: Every day | ORAL | Status: DC

## 2015-05-26 MED ORDER — ROPINIROLE HCL 0.25 MG PO TABS: ORAL_TABLET | ORAL | Status: DC

## 2015-05-26 MED ORDER — ZOLPIDEM TARTRATE 5 MG PO TABS: 5.0000 mg | ORAL_TABLET | Freq: Every evening | ORAL | Status: DC | PRN

## 2015-05-26 NOTE — Assessment & Plan Note (Signed)
Reviewed notes from [facility]. Plan to repeat CT if any recurrent symptoms of diverticulitis. For now, continue FODMAP diet. Follow up in 3 months and prn.

## 2015-05-26 NOTE — Patient Instructions (Signed)
Start Wellbutrin 150mg  daily in the morning to help with appetite.  Follow up in 3 months.

## 2015-05-26 NOTE — Assessment & Plan Note (Signed)
Wt Readings from Last 3 Encounters:  05/26/15 276 lb (125.193 kg)  03/19/15 277 lb 4 oz (125.76 kg)  03/09/15 274 lb 8 oz (124.512 kg)   Body mass index is 43.89 kg/(m^2). Encouraged him to limit portion sizes and increase physical activity. Will start Wellbutrin to help with appetite. Follow up in 3 months.

## 2015-05-26 NOTE — Assessment & Plan Note (Signed)
Symptoms improved with Ambien. Will continue.

## 2015-05-26 NOTE — Assessment & Plan Note (Signed)
Symptoms well controlled with Requip. Will continue.

## 2015-05-26 NOTE — Progress Notes (Signed)
Subjective:    Patient ID: Paul Cook, male    DOB: 08/11/1951, 64 y.o.   MRN: [id number]  HPI  64YO male presents for follow up.  Seen by GI at [facility]. Started on FODMAP diet. Had huge improvement in symptoms with no pain in 2 months. Plans to get CT if any recurrent symptoms.  Frustrated by weight gain over last few months. Not following any diet or exercise program.  Wt Readings from Last 3 Encounters:  05/26/15 276 lb (125.193 kg)  03/19/15 277 lb 4 oz (125.76 kg)  03/09/15 274 lb 8 oz (124.512 kg)   Insomnia - Taking Ambien 5-10mg  at bedtime with improvement.   Restless leg - Taking 0.75mg  most days with improvement in symptoms.  Past medical, surgical, family and social history per today's encounter.   Review of Systems  Constitutional: Negative for fever, chills, activity change, appetite change, fatigue and unexpected weight change.  Eyes: Negative for visual disturbance.  Respiratory: Negative for cough and shortness of breath.   Cardiovascular: Negative for chest pain, palpitations and leg swelling.  Gastrointestinal: Negative for nausea, vomiting, abdominal pain, diarrhea, constipation and abdominal distention.  Genitourinary: Negative for dysuria, urgency and difficulty urinating.  Musculoskeletal: Negative for arthralgias and gait problem.  Skin: Negative for color change and rash.  Hematological: Negative for adenopathy.  Psychiatric/Behavioral: Positive for sleep disturbance. Negative for dysphoric mood. The patient is not nervous/anxious.        Objective:    BP 136/70 mmHg  Pulse 80  Temp(Src) 97.8 F (36.6 C) (Oral)  Wt 276 lb (125.193 kg)  SpO2 98% Physical Exam  Constitutional: He is oriented to person, place, and time. He appears well-developed and well-nourished. No distress.  HENT:  Head: Normocephalic and atraumatic.  Right Ear: External ear normal.  Left Ear: External ear normal.  Nose: Nose normal.  Mouth/Throat: Oropharynx is clear  and moist. No oropharyngeal exudate.  Eyes: Conjunctivae and EOM are normal. Pupils are equal, round, and reactive to light. Right eye exhibits no discharge. Left eye exhibits no discharge. No scleral icterus.  Neck: Normal range of motion. Neck supple. No tracheal deviation present. No thyromegaly present.  Cardiovascular: Normal rate, regular rhythm and normal heart sounds.  Exam reveals no gallop and no friction rub.   No murmur heard. Pulmonary/Chest: Effort normal and breath sounds normal. No respiratory distress. He has no wheezes. He has no rales. He exhibits no tenderness.  Abdominal: Soft. Bowel sounds are normal. He exhibits no distension and no mass. There is no tenderness. There is no rebound and no guarding.  Musculoskeletal: Normal range of motion. He exhibits no edema.  Lymphadenopathy:    He has no cervical adenopathy.  Neurological: He is alert and oriented to person, place, and time. No cranial nerve deficit. Coordination normal.  Skin: Skin is warm and dry. No rash noted. He is not diaphoretic. No erythema. No pallor.  Psychiatric: He has a normal mood and affect. His behavior is normal. Judgment and thought content normal.          Assessment & Plan:  Over 71min of which >50% spent in face-to-face contact with patient discussing plan of care  Problem List Items Addressed This Visit      Unprioritized   Diverticulitis of colon without hemorrhage - Primary    Reviewed notes from [facility]. Plan to repeat CT if any recurrent symptoms of diverticulitis. For now, continue FODMAP diet. Follow up in 3 months and prn.  Relevant Orders   Ambulatory referral to Gastroenterology   Insomnia    Symptoms improved with Ambien. Will continue.      Restless legs    Symptoms well controlled with Requip. Will continue.      Severe obesity (BMI >= 40)    Wt Readings from Last 3 Encounters:  05/26/15 276 lb (125.193 kg)  03/19/15 277 lb 4 oz (125.76 kg)  03/09/15 274 lb 8  oz (124.512 kg)   Body mass index is 43.89 kg/(m^2). Encouraged him to limit portion sizes and increase physical activity. Will start Wellbutrin to help with appetite. Follow up in 3 months.          Return in about 3 months (around 08/26/2015) for Recheck.

## 2015-05-27 ENCOUNTER — Other Ambulatory Visit: Payer: Self-pay | Admitting: *Deleted

## 2015-05-27 MED ORDER — ROPINIROLE HCL 0.25 MG PO TABS: ORAL_TABLET | ORAL | Status: DC

## 2015-05-29 ENCOUNTER — Other Ambulatory Visit: Payer: Self-pay | Admitting: Internal Medicine

## 2015-05-29 ENCOUNTER — Encounter: Payer: Self-pay | Admitting: Internal Medicine

## 2015-05-29 NOTE — Telephone Encounter (Signed)
Patient left a message on Triage Line today.   Pt is requesting a refill on Cipro and the MetroNidazole.   Pls advise at your earliest convenience.

## 2015-05-29 NOTE — Telephone Encounter (Signed)
Tried to call pt. Sent my chart message with information about refusal.

## 2015-05-29 NOTE — Telephone Encounter (Signed)
Pt contacted and given refill denial. Pt is in pain. Insurance will not pay for the CT scan, but according to pt his insurance will after 3 months.   Pls advise if pt needs an appt, pain rx, or any other treatment recommendation.

## 2015-05-29 NOTE — Telephone Encounter (Signed)
Ok to fill 

## 2015-05-30 ENCOUNTER — Encounter: Payer: Self-pay | Admitting: Internal Medicine

## 2015-05-30 DIAGNOSIS — R1031 Right lower quadrant pain: Secondary | ICD-10-CM

## 2015-06-05 ENCOUNTER — Encounter: Payer: Self-pay | Admitting: Gastroenterology

## 2015-06-11 ENCOUNTER — Ambulatory Visit: Payer: Managed Care, Other (non HMO) | Admitting: Internal Medicine

## 2015-06-22 ENCOUNTER — Other Ambulatory Visit: Payer: Self-pay | Admitting: Internal Medicine

## 2015-06-25 ENCOUNTER — Ambulatory Visit: Admission: RE | Admit: 2015-06-25 | Payer: Managed Care, Other (non HMO) | Source: Ambulatory Visit

## 2015-06-25 ENCOUNTER — Ambulatory Visit
Admission: RE | Admit: 2015-06-25 | Discharge: 2015-06-25 | Disposition: A | Discharge status: Home / Self Care | Payer: Managed Care, Other (non HMO) | Source: Ambulatory Visit | Attending: Internal Medicine | Admitting: Internal Medicine

## 2015-06-25 DIAGNOSIS — N281 Cyst of kidney, acquired: Secondary | ICD-10-CM | POA: Insufficient documentation

## 2015-06-25 DIAGNOSIS — R1031 Right lower quadrant pain: Secondary | ICD-10-CM | POA: Diagnosis present

## 2015-06-25 DIAGNOSIS — K5730 Diverticulosis of large intestine without perforation or abscess without bleeding: Secondary | ICD-10-CM | POA: Diagnosis not present

## 2015-06-25 DIAGNOSIS — R911 Solitary pulmonary nodule: Secondary | ICD-10-CM | POA: Insufficient documentation

## 2015-06-25 MED ORDER — IOHEXOL 350 MG/ML SOLN
100.0000 mL | Freq: Once | INTRAVENOUS | Status: AC | PRN
  Administered 2015-06-25: 100 mL via INTRAVENOUS

## 2015-07-30 ENCOUNTER — Ambulatory Visit: Payer: Managed Care, Other (non HMO) | Admitting: Gastroenterology

## 2015-07-31 ENCOUNTER — Telehealth: Payer: Self-pay | Admitting: Internal Medicine

## 2015-07-31 ENCOUNTER — Encounter: Payer: Self-pay | Admitting: Gastroenterology

## 2015-07-31 ENCOUNTER — Other Ambulatory Visit: Payer: Self-pay

## 2015-07-31 DIAGNOSIS — B372 Candidiasis of skin and nail: Secondary | ICD-10-CM

## 2015-07-31 MED ORDER — NYSTATIN 100000 UNIT/GM EX POWD: CUTANEOUS | Status: DC

## 2015-07-31 NOTE — Telephone Encounter (Signed)
I have refilled his request.

## 2015-07-31 NOTE — Telephone Encounter (Signed)
nystatin cream (MYCOSTATIN) Send to Paul Cook. [address]

## 2015-08-03 ENCOUNTER — Ambulatory Visit (INDEPENDENT_AMBULATORY_CARE_PROVIDER_SITE_OTHER): Payer: Managed Care, Other (non HMO) | Admitting: Physician Assistant

## 2015-08-03 ENCOUNTER — Encounter: Payer: Self-pay | Admitting: Physician Assistant

## 2015-08-03 VITALS — BP 118/72 | HR 68 | Ht 66.5 in | Wt 282.0 lb

## 2015-08-03 DIAGNOSIS — K589 Irritable bowel syndrome without diarrhea: Secondary | ICD-10-CM | POA: Diagnosis not present

## 2015-08-03 DIAGNOSIS — K5732 Diverticulitis of large intestine without perforation or abscess without bleeding: Secondary | ICD-10-CM | POA: Diagnosis not present

## 2015-08-03 DIAGNOSIS — Z86010 Personal history of colonic polyps: Secondary | ICD-10-CM

## 2015-08-03 MED ORDER — DICYCLOMINE HCL 10 MG PO CAPS: 20.0000 mg | ORAL_CAPSULE | Freq: Three times a day (TID) | ORAL | Status: DC

## 2015-08-03 NOTE — Patient Instructions (Signed)
We have sent the following medications to your pharmacy for you to pick up at your convenience: Bentyl 20 mg three times a day.  Please start using Benefiber 1 heaping tablespoon daily in 6 oz of water.   Call in 2 weeks if no improvement.   Please call to make a follow up with Cecille Rubin Hvozdovic, PA in one month.

## 2015-08-04 ENCOUNTER — Encounter: Payer: Self-pay | Admitting: Physician Assistant

## 2015-08-04 ENCOUNTER — Ambulatory Visit: Payer: Managed Care, Other (non HMO) | Admitting: Gastroenterology

## 2015-08-04 NOTE — Progress Notes (Signed)
Agree with assessment and plan 

## 2015-08-04 NOTE — Progress Notes (Signed)
Patient ID: Paul Cook, male   DOB: Dec 18, 1950, 64 y.o.   MRN: [id number]    HPI:  Paul Cook is a 64 y.o.   male referred by Paul Confer, MD for evaluation of abdominal pain. He has a past medical history of depression, asthma, hypertension,, sleep apnea, adenomatous colon polyps, and a lung nodule noted on CT in January 2016.  Paul Cook has previously been seen at the [facility] gastroenterology clinic. He had a colonoscopy in the fall of 2015 at [facility]. At that time he had a TVA resected and a large polyp in the sigmoid colon demonstrating features concerning for ImCA for which she was initially referred for surgical intervention. He was then referred to [facility] and seen in the advanced endoscopy clinic. He underwent a flexible sigmoidoscopy at which time a 3 cm pedunculated polyp was resected with pathology demonstrating a tubular adenoma. He was last seen at the [facility] clinic with reports of several episodes of diverticulitis. He states that he had been feeling fine until November 2015 when he had his flexible sigmoidoscopy for polyp resection. He reports that he began to experience lower abdominal pain following his endoscopic procedure. He reports that he felt very bloated and had intermittent episodes of sharp left lower quadrant and suprapubic pain. He was seen at [facility] regional 1 month after sigmoidoscopy and empirically prescribed a course of antibiotics. No CT was obtained. He had recurrent symptoms in January at which time he had a CT scan with reported sigmoid diverticulitis and was treated with Cipro and Flagyl. He had recurrent abdominal pain one month later, and over the past 7 or 8 months has had for 5 episodes of antibiotics. His last CT scan was on 06/25/2015 and demonstrated colonic diverticulosis without evidence for acute colonic inflammation. He reports that he continues to have crampy lower abdominal pain prior to defecation that is not always alleviated with  defecation he reports that ingestion of different foods will cause him to have pain, especially cruciferous vegetables and fatty foods. He had been prescribed Levsin in the past, however he states he has not used it in over 2 years because he didn't know what it was for. He reports that he was told years ago he had IBS. He is currently moving his bowels on a daily basis but states his stools are often thin and broken into pieces. He has no associated fever, chills, or night sweats. His appetite has been good and his weight has been stable. He is trying to lose weight. He was given a trial of a FODMAP diet at [facility] which he feels decreased the amount of gas he has. He is here today to establish care with a new gastroenterologist.   Past Medical History  Diagnosis Date  . Asthma   . Depression   . GERD (gastroesophageal reflux disease)   . Hypertension   . Diverticulosis   . History of chicken pox   . Adenomatous colon polyp     tubular  . Adenomatous rectal polyp     tubular  . Lung nodule     CT 10/2014    Past Surgical History  Procedure Laterality Date  . Hernia repair      inguinal  . Colonoscopy w/ polypectomy  2015    [facility], benign   Family History  Problem Relation Age of Onset  . Depression Mother   . Hypertension Father   . Heart disease Father   . Stroke Father   .  Cancer Maternal Grandmother     ovarian?  . Cancer Paternal Grandfather     prostate   Social History  Substance Use Topics  . Smoking status: Never Smoker   . Smokeless tobacco: Never Used  . Alcohol Use: No   Current Outpatient Prescriptions  Medication Sig Dispense Refill  . albuterol (PROVENTIL HFA;VENTOLIN HFA) 108 (90 BASE) MCG/ACT inhaler Inhale 2 puffs into the lungs every 6 (six) hours as needed for wheezing or shortness of breath. 1 Inhaler 0  . Alpha-D-Galactosidase (BEANO PO) Take by mouth.    Marland Kitchen buPROPion (WELLBUTRIN XL) 150 MG 24 hr tablet Take 1 tablet (150 mg total) by mouth daily. 30  tablet 3  . gabapentin (NEURONTIN) 600 MG tablet Take 1 tablet (600 mg total) by mouth 3 (three) times daily. 270 tablet 1  . hydrocortisone-pramoxine (ANALPRAM-HC) 2.5-1 % rectal cream APPLY RECTALLY THREE TIMES DAILY 30 g 3  . hyoscyamine (LEVSIN SL) 0.125 MG SL tablet TAKE 1 TABLETUNDER THE TONGUE EVERY 4 HOURS AS NEEDED FOR CRAMPING 30 tablet 0  . lisinopril-hydrochlorothiazide (PRINZIDE,ZESTORETIC) 20-25 MG per tablet TAKE 1 TABLET DAILY 90 tablet 1  . LORazepam (ATIVAN) 0.5 MG tablet TAKE 1 TO 2 TABLETS BY MOUTH AT BEDTIME 60 tablet 0  . MISC NATURAL PRODUCTS PO Take by mouth. Carrot juice with Dandelion    . mometasone (NASONEX) 50 MCG/ACT nasal spray USE 2 SPRAYS NASALLY DAILY 51 g 2  . nystatin (MYCOSTATIN/NYSTOP) 100000 UNIT/GM POWD Apply powder to groin twice daily 60 g 6  . nystatin cream (MYCOSTATIN) Apply 1 application topically 2 (two) times daily. 30 g 6  . Probiotic Product (PROBIOTIC DAILY PO) Take by mouth.    . ranitidine (ZANTAC) 150 MG tablet Take 1 tablet (150 mg total) by mouth 2 (two) times daily. 180 tablet 3  . rOPINIRole (REQUIP) 0.25 MG tablet TAKE 3 TABLETS DAILY AS DIRECTED. 270 tablet 1  . trazodone (DESYREL) 300 MG tablet Take 1 tablet (300 mg total) by mouth at bedtime. 90 tablet 0  . zolpidem (AMBIEN) 5 MG tablet Take 1-2 tablets (5-10 mg total) by mouth at bedtime as needed. for sleep 180 tablet 1  . dicyclomine (BENTYL) 10 MG capsule Take 2 capsules (20 mg total) by mouth 3 (three) times daily before meals. 180 capsule 3   No current facility-administered medications for this visit.   Allergies  Allergen Reactions  . Nsaids Other (See Comments)    GI Issues     Review of Systems: Gen: Denies any fever, chills, sweats, anorexia, fatigue, weakness, malaise, weight loss, and sleep disorder CV: Denies chest pain, angina, palpitations, syncope, orthopnea, PND, peripheral edema, and claudication. Resp: Denies dyspnea at rest, dyspnea with exercise, cough,  sputum, wheezing, coughing up blood, and pleurisy. GI: Denies vomiting blood, jaundice, and fecal incontinence.   Denies dysphagia or odynophagia. GU : Denies urinary burning, blood in urine, urinary frequency, urinary hesitancy, nocturnal urination, and urinary incontinence. MS: Denies joint pain, limitation of movement, and swelling, stiffness, low back pain, extremity pain. Denies muscle weakness, cramps, atrophy.  Derm: Denies rash, itching, dry skin, hives, moles, warts, or unhealing ulcers.  Psych: Denies depression, anxiety, memory loss, suicidal ideation, hallucinations, paranoia, and confusion. Heme: Denies bruising, bleeding, and enlarged lymph nodes. Neuro:  Denies any headaches, dizziness, paresthesias. Endo:  Denies any problems with DM, thyroid, adrenal function  Studies:     CLINICAL DATA: Follow-up diverticulitis. Symptoms are improving. History of inguinal hernia repair.  EXAM: CT ABDOMEN AND PELVIS  WITH CONTRAST  TECHNIQUE: Multidetector CT imaging of the abdomen and pelvis was performed using the standard protocol following bolus administration of intravenous contrast.  CONTRAST: 129mL OMNIPAQUE IOHEXOL 350 MG/ML SOLN  COMPARISON: 03/18/2015  FINDINGS: Again noted is a punctate nodular density at the right lung base on sequence 5, image 8 which is unchanged. Otherwise, the lung bases are clear. No evidence for free intraperitoneal air.  Stable low-density structures throughout the liver are suggestive for cysts. Normal appearance of the gallbladder and portal venous system is patent. Normal appearance of the pancreas and spleen. Normal appearance of the adrenal glands. Normal appearance of the right kidney. Again noted is a bilobed or mildly complex cyst in the left kidney mid pole measuring up to 4.6 cm. There are additional left renal cysts. No acute abnormality to the left kidney.  Normal appearance of the prostate and urinary bladder  is decompressed. Again noted is extensive diverticulosis involving the sigmoid colon and left colon. No clear evidence for acute inflammation. Few prominent colonic diverticula at the hepatic flexure. Normal appearance of the small bowel. Few prominent lymph nodes in the right lower abdominal mesentery have minimally changed. No gross abnormality to the stomach or duodenum. No significant free fluid or lymphadenopathy.  Degenerative facet disease in lumbar spine.  IMPRESSION: Colonic diverticulosis without evidence for acute colonic inflammation.  Hepatic and left renal cysts.  Stable punctate nodule at the right lung base is indeterminate. If the patient is at high risk for bronchogenic carcinoma, follow-up chest CT at 1 year is recommended. If the patient is at low risk, no follow-up is needed. This recommendation follows the consensus statement: Guidelines for Management of Small Pulmonary Nodules Detected on CT Scans: A Statement from the May as published in Radiology 2005; 237:395-400.   Electronically Signed  By: Markus Daft M.D.  On: 06/25/2015 16:25   Abdomen Pelvis W Contrast   Status: Final result       PACS Images     Show images for CT Abdomen Pelvis W Contrast     Study Result     CLINICAL DATA: Pelvic pain for 2 weeks, history diverticulitis January 2016, hypertension  EXAM: CT ABDOMEN AND PELVIS WITH CONTRAST  TECHNIQUE: Multidetector CT imaging of the abdomen and pelvis was performed using the standard protocol following bolus administration of intravenous contrast. Sagittal and coronal MPR images reconstructed from axial data set.  CONTRAST: 156mL OMNIPAQUE IOHEXOL 350 MG/ML SOLN IV. Dilute oral contrast.  COMPARISON: 11/13/2014  FINDINGS: Lung bases clear.  LEFT renal cysts largest a bilobed cyst 4.8 x 3.0 cm image 37.  Small hepatic cysts.  Liver, gallbladder, spleen, pancreas, kidneys, and  adrenal glands otherwise normal.  Normal appendix.  Diverticulosis of descending and sigmoid colon with minimal sigmoid wall thickening and questionable focus of subtle pericolic infiltrative change, cannot completely exclude subtle acute diverticulitis.  Stomach and bowel loops otherwise normal.  Prominent bladder wall likely related to collapsed state.  No mass, adenopathy, free air, free fluid, hernia, or acute osseous findings.  Scattered degenerative disc and facet disease changes thoracolumbar spine.  IMPRESSION: Hepatic and LEFT renal cysts.  Distal colonic diverticulosis with questionable focus of subtle pericolic infiltration, unable to exclude subtle acute diverticulitis.   Electronically Signed  By: Lavonia Dana M.D.  On: 03/18/2015 16:56     LAB RESULTS: Blood work on 03/09/2015 showed a CBC with WBC 11.9, hemoglobin 14.2, hematocrit 41.6, platelets 225,000.   Prior Endoscopies:  See history of present illness  Physical Exam: BP 118/72 mmHg  Pulse 68  Ht 5' 6.5" (1.689 m)  Wt 282 lb (127.914 kg)  BMI 44.84 kg/m2 Constitutional: Pleasant, obese, Caucasian male in no acute distress. HEENT: Normocephalic and atraumatic. Conjunctivae are normal. No scleral icterus. Neck supple. No JVD Cardiovascular: Normal rate, regular rhythm.  Pulmonary/chest: Effort normal and breath sounds normal. No wheezing, rales or rhonchi. Abdominal: Soft, nondistended, mild left lower quadrant and suprapubic tenderness to palpation with no rebound or guarding,. Bowel sounds active throughout. There are no masses palpable. No hepatomegaly. Abdominal pain is not reproducible with having patient attempt to crunch maneuver or resisted straight leg raise while supine. Extremities: no edema Lymphadenopathy: No cervical adenopathy noted. Neurological: Alert and oriented to person place and time. Skin: Skin is warm and dry. No rashes noted. Psychiatric: Normal mood and  affect. Behavior is normal.  ASSESSMENT AND PLAN: 64 year old male with a history of extensive diverticular disease, presenting with ongoing frequent episodes of crampy post pharyngeal abdominal pain not always alleviated with defecation. He reports that his pain typically improves with a course of antibiotics. His pain may be functional in nature but may also represent an ongoing low-grade diverticulitis. He's been encouraged to adhere to a low-fat diet. He will use Benefiber heaping tablespoon in water twice daily to try to bulk his stools. He will be given a trial of Bentyl 20 mg 3 times a day, and he has been instructed to call us in 2 weeks to let us know if this provides some relief. He has signed a medical release to obtain colonoscopy and pathology reports from [facility] gastroenterology clinic. He will return in one month, sooner if needed. At that time we will review whether or not he is due for surveillance colonoscopy in light of resection of a TVA in the size of the tubular adenoma that was removed.    , Deloris Ping 08/04/2015, 7:54 AM  CC: Paul Confer, MD

## 2015-08-06 ENCOUNTER — Ambulatory Visit: Payer: Managed Care, Other (non HMO) | Admitting: Gastroenterology

## 2015-08-12 ENCOUNTER — Ambulatory Visit: Payer: Managed Care, Other (non HMO) | Admitting: Dietician

## 2015-08-27 ENCOUNTER — Ambulatory Visit: Payer: Managed Care, Other (non HMO) | Admitting: Internal Medicine

## 2015-08-28 ENCOUNTER — Ambulatory Visit: Payer: Managed Care, Other (non HMO) | Admitting: Internal Medicine

## 2015-08-31 ENCOUNTER — Ambulatory Visit (INDEPENDENT_AMBULATORY_CARE_PROVIDER_SITE_OTHER): Payer: Managed Care, Other (non HMO) | Admitting: Physician Assistant

## 2015-08-31 ENCOUNTER — Encounter: Payer: Self-pay | Admitting: Physician Assistant

## 2015-08-31 VITALS — BP 160/90 | HR 84 | Ht 64.75 in | Wt 283.0 lb

## 2015-08-31 DIAGNOSIS — Z86010 Personal history of colonic polyps: Secondary | ICD-10-CM

## 2015-08-31 DIAGNOSIS — K5730 Diverticulosis of large intestine without perforation or abscess without bleeding: Secondary | ICD-10-CM | POA: Diagnosis not present

## 2015-08-31 DIAGNOSIS — K589 Irritable bowel syndrome without diarrhea: Secondary | ICD-10-CM | POA: Diagnosis not present

## 2015-08-31 NOTE — Progress Notes (Signed)
Patient ID: Paul Cook, male   DOB: 1951/10/13, 64 y.o.   MRN: [id number]     History of Present Illness: Paul Cook is a delightful 64 year old male who was first evaluated here on October 18. He had previously been seen at [facility] gastroenterology clinic. He had a colonoscopy in the fall of 2015 at [facility] at that time he had a TVA resected and a large polyp in the sigmoid demonstrating features concerning for IM CA. He was referred for surgical intervention and was seen at the [facility] advance endoscopy clinic. He underwent a flexible sigmoidoscopy at which time a 3 cm pedunculated polyp was resected with pathology demonstrating a tubular adenoma. He was advised to have surveillance in 3 years. He states that around the time of his flexible sigmoidoscopy he began to experience lower abdominal pain with bloating. He had been seen at [facility] and empirically prescribed a course of antibiotics. No CT was obtained. He had recurrent symptoms in January 2016 at which time he had a CT scan which reported sigmoid diverticulitis and he was treated with Cipro and Flagyl. Over the past 7 months he has had 5 episodes of antibiotics. His last CT was in September 2006 and demonstrated colonic diverticulosis without evidence for acute colonic information. At his last visit he was prescribed Bentyl 20 mg 3 times a day. He returns today and states he hasn't felt this well in years. With 3 times a day use of Bentyl his stools have regulated he has no pain. He is tolerating a regular diet. He has no bright red blood per rectum or melena. He's had no fever or chills.   Past Medical History  Diagnosis Date  . Asthma   . Depression   . GERD (gastroesophageal reflux disease)   . Hypertension   . Diverticulosis   . History of chicken pox   . Adenomatous colon polyp     tubular  . Adenomatous rectal polyp     tubular  . Lung nodule     CT 10/2014    Past Surgical History  Procedure  Laterality Date  . Hernia repair      inguinal  . Colonoscopy w/ polypectomy  2015    [facility], benign   Family History  Problem Relation Age of Onset  . Depression Mother   . Hypertension Father   . Heart disease Father   . Stroke Father   . Cancer Maternal Grandmother     ovarian?  . Cancer Paternal Grandfather     prostate   Social History  Substance Use Topics  . Smoking status: Never Smoker   . Smokeless tobacco: Never Used  . Alcohol Use: No   Current Outpatient Prescriptions  Medication Sig Dispense Refill  . albuterol (PROVENTIL HFA;VENTOLIN HFA) 108 (90 BASE) MCG/ACT inhaler Inhale 2 puffs into the lungs every 6 (six) hours as needed for wheezing or shortness of breath. 1 Inhaler 0  . Alpha-D-Galactosidase (BEANO PO) Take by mouth.    Marland Kitchen buPROPion (WELLBUTRIN XL) 150 MG 24 hr tablet Take 1 tablet (150 mg total) by mouth daily. 30 tablet 3  . dicyclomine (BENTYL) 10 MG capsule Take 2 capsules (20 mg total) by mouth 3 (three) times daily before meals. 180 capsule 3  . gabapentin (NEURONTIN) 600 MG tablet Take 1 tablet (600 mg total) by mouth 3 (three) times daily. 270 tablet 1  . hydrocortisone-pramoxine (ANALPRAM-HC) 2.5-1 % rectal cream APPLY RECTALLY THREE TIMES DAILY 30 g 3  .  hyoscyamine (LEVSIN SL) 0.125 MG SL tablet TAKE 1 TABLETUNDER THE TONGUE EVERY 4 HOURS AS NEEDED FOR CRAMPING 30 tablet 0  . lisinopril-hydrochlorothiazide (PRINZIDE,ZESTORETIC) 20-25 MG per tablet TAKE 1 TABLET DAILY 90 tablet 1  . LORazepam (ATIVAN) 0.5 MG tablet TAKE 1 TO 2 TABLETS BY MOUTH AT BEDTIME 60 tablet 0  . MISC NATURAL PRODUCTS PO Take by mouth. Carrot juice with Dandelion    . mometasone (NASONEX) 50 MCG/ACT nasal spray USE 2 SPRAYS NASALLY DAILY 51 g 2  . nystatin (MYCOSTATIN/NYSTOP) 100000 UNIT/GM POWD Apply powder to groin twice daily 60 g 6  . nystatin cream (MYCOSTATIN) Apply 1 application topically 2 (two) times daily. 30 g 6  . Probiotic Product (PROBIOTIC DAILY PO) Take by  mouth.    . ranitidine (ZANTAC) 150 MG tablet Take 1 tablet (150 mg total) by mouth 2 (two) times daily. 180 tablet 3  . rOPINIRole (REQUIP) 0.25 MG tablet TAKE 3 TABLETS DAILY AS DIRECTED. 270 tablet 1  . trazodone (DESYREL) 300 MG tablet Take 1 tablet (300 mg total) by mouth at bedtime. 90 tablet 0  . zolpidem (AMBIEN) 5 MG tablet Take 1-2 tablets (5-10 mg total) by mouth at bedtime as needed. for sleep 180 tablet 1   No current facility-administered medications for this visit.   Allergies  Allergen Reactions  . Nsaids Other (See Comments)    GI Issues     Review of Systems: Gen: Denies any fever, chills, sweats, anorexia, fatigue, weakness, malaise, weight loss, and sleep disorder CV: Denies chest pain, angina, palpitations, syncope, orthopnea, PND, peripheral edema, and claudication. Resp: Denies dyspnea at rest, dyspnea with exercise, cough, sputum, wheezing, coughing up blood, and pleurisy. GI: Denies vomiting blood, jaundice, and fecal incontinence.   Denies dysphagia or odynophagia. GU : Denies urinary burning, blood in urine, urinary frequency, urinary hesitancy, nocturnal urination, and urinary incontinence. MS: Denies joint pain, limitation of movement, and swelling, stiffness, low back pain, extremity pain. Denies muscle weakness, cramps, atrophy.  Derm: Denies rash, itching, dry skin, hives, moles, warts, or unhealing ulcers.  Psych: Denies depression, anxiety, memory loss, suicidal ideation, hallucinations, paranoia, and confusion. Heme: Denies bruising, bleeding, and enlarged lymph nodes. Neuro:  Denies any headaches, dizziness, paresthesia Endo:  Denies any problems with DM, thyroid, adrenal    Physical Exam: BP 160/90 mmHg  Pulse 84  Ht 5' 4.75" (1.645 m)  Wt 283 lb (128.368 kg)  BMI 47.44 kg/m2 General: Pleasant, obese Caucasian male in no acute distress Head: Normocephalic and atraumatic Eyes:  sclerae anicteric, conjunctiva pink  Ears: Normal auditory  acuity Lungs: Clear throughout to auscultation Heart: Regular rate and rhythm Abdomen: Soft, non distended, non-tender. No masses, no hepatomegaly. Normal bowel sounds Musculoskeletal: Symmetrical with no gross deformities  Extremities: No edema  Neurological: Alert oriented x 4, grossly nonfocal Psychological:  Alert and cooperative. Normal mood and affect  Assessment and Recommendations: 64 year old male with a personal history of colon polyps, IBS, and diverticulosis here for follow-up. He has had relief of his daily postprandial cramping with 3 times a day use of Bentyl 20 mg. He states he has been pain free for the past week. He has been encouraged to try to decrease his Bentyl to twice a day and if possible, to when necessary. He will continue a low-fat diet and daily use of Benefiber. He will follow up in February at which time he would like to establish care with Dr. Havery Moros. He will further discuss colonoscopic surveillance at that time.  , Vita Barley PA-C 08/31/2015,

## 2015-08-31 NOTE — Patient Instructions (Signed)
Follow up with Dr Havery Moros in 3 months. [phone]  Please continue Benefiber daily.  Please continue Bentyl as needed.

## 2015-09-01 NOTE — Progress Notes (Signed)
Agree with assessment and plan as outlined.  

## 2015-09-02 ENCOUNTER — Ambulatory Visit: Payer: Managed Care, Other (non HMO) | Admitting: Internal Medicine

## 2015-09-06 ENCOUNTER — Other Ambulatory Visit: Payer: Self-pay | Admitting: Internal Medicine

## 2015-09-07 ENCOUNTER — Ambulatory Visit: Payer: Managed Care, Other (non HMO) | Admitting: Dietician

## 2015-09-07 NOTE — Telephone Encounter (Signed)
Please advise 

## 2015-09-18 ENCOUNTER — Other Ambulatory Visit: Payer: Self-pay | Admitting: Internal Medicine

## 2015-09-18 NOTE — Telephone Encounter (Signed)
Last OV 8/16 ok to fill? 

## 2015-09-30 ENCOUNTER — Encounter: Payer: Self-pay | Admitting: Internal Medicine

## 2015-09-30 ENCOUNTER — Ambulatory Visit (INDEPENDENT_AMBULATORY_CARE_PROVIDER_SITE_OTHER): Payer: Managed Care, Other (non HMO) | Admitting: Internal Medicine

## 2015-09-30 VITALS — BP 150/80 | HR 76 | Temp 98.2°F | Ht 65.0 in | Wt 284.2 lb

## 2015-09-30 DIAGNOSIS — R938 Abnormal findings on diagnostic imaging of other specified body structures: Secondary | ICD-10-CM | POA: Diagnosis not present

## 2015-09-30 DIAGNOSIS — B372 Candidiasis of skin and nail: Secondary | ICD-10-CM

## 2015-09-30 DIAGNOSIS — K589 Irritable bowel syndrome without diarrhea: Secondary | ICD-10-CM

## 2015-09-30 DIAGNOSIS — I10 Essential (primary) hypertension: Secondary | ICD-10-CM

## 2015-09-30 DIAGNOSIS — R9389 Abnormal findings on diagnostic imaging of other specified body structures: Secondary | ICD-10-CM

## 2015-09-30 MED ORDER — NYSTATIN 100000 UNIT/GM EX CREA: 1.0000 "application " | TOPICAL_CREAM | Freq: Two times a day (BID) | CUTANEOUS | Status: DC

## 2015-09-30 MED ORDER — LIRAGLUTIDE -WEIGHT MANAGEMENT 18 MG/3ML ~~LOC~~ SOPN
0.6000 mg | PEN_INJECTOR | Freq: Every day | SUBCUTANEOUS | Status: DC
Start: 2015-09-30 — End: 2015-12-18

## 2015-09-30 MED ORDER — NYSTATIN 100000 UNIT/GM EX POWD: CUTANEOUS | Status: DC

## 2015-09-30 NOTE — Progress Notes (Signed)
Pre visit review using our clinic review tool, if applicable. No additional management support is needed unless otherwise documented below in the visit note. 

## 2015-09-30 NOTE — Progress Notes (Signed)
Subjective:    Patient ID: Paul Cook, male    DOB: 1951/04/22, 64 y.o.   MRN: [id number]  HPI  64YO male presents for follow up.  Recently seen at [facility]. Continues on Bentyl to help with abdominal cramping. Had huge improvement with this. Pain resolved. Some foods such as peppers still trigger symptoms. Has follow up pending.  He would like to work on weight loss. He is trying to follow a healthy diet. He does not exercise.    Wt Readings from Last 3 Encounters:  09/30/15 284 lb 4 oz (128.935 kg)  08/31/15 283 lb (128.368 kg)  08/03/15 282 lb (127.914 kg)   BP Readings from Last 3 Encounters:  09/30/15 150/80  08/31/15 160/90  08/03/15 118/72    Past Medical History  Diagnosis Date  . Asthma   . Depression   . GERD (gastroesophageal reflux disease)   . Hypertension   . Diverticulosis   . History of chicken pox   . Adenomatous colon polyp     tubular  . Adenomatous rectal polyp     tubular  . Lung nodule     CT 10/2014   Family History  Problem Relation Age of Onset  . Depression Mother   . Hypertension Father   . Heart disease Father   . Stroke Father   . Cancer Maternal Grandmother     ovarian?  . Cancer Paternal Grandfather     prostate   Past Surgical History  Procedure Laterality Date  . Hernia repair      inguinal  . Colonoscopy w/ polypectomy  2015    [facility], benign   Social History   Social History  . Marital Status: Married    Spouse Name: N/A  . Number of Children: N/A  . Years of Education: N/A   Social History Main Topics  . Smoking status: Never Smoker   . Smokeless tobacco: Never Used  . Alcohol Use: No  . Drug Use: No  . Sexual Activity: Not Asked   Other Topics Concern  . None   Social History Narrative   Lives in [place] with wife, Paul Cook. 1 daughter in [place].      Work - Optician, dispensing, now Animal nutritionist                Review of Systems  Constitutional: Negative for fever, chills,  activity change, appetite change, fatigue and unexpected weight change.  Eyes: Negative for visual disturbance.  Respiratory: Negative for cough and shortness of breath.   Cardiovascular: Negative for chest pain, palpitations and leg swelling.  Gastrointestinal: Negative for nausea, vomiting, abdominal pain, diarrhea, constipation and abdominal distention.  Genitourinary: Negative for dysuria, urgency and difficulty urinating.  Musculoskeletal: Negative for arthralgias and gait problem.  Skin: Negative for color change and rash.  Hematological: Negative for adenopathy.  Psychiatric/Behavioral: Negative for sleep disturbance and dysphoric mood. The patient is not nervous/anxious.        Objective:    BP 150/80 mmHg  Pulse 76  Temp(Src) 98.2 F (36.8 C) (Oral)  Ht 5\' 5"  (1.651 m)  Wt 284 lb 4 oz (128.935 kg)  BMI 47.30 kg/m2  SpO2 96% Physical Exam  Constitutional: He is oriented to person, place, and time. He appears well-developed and well-nourished. No distress.  HENT:  Head: Normocephalic and atraumatic.  Right Ear: External ear normal.  Left Ear: External ear normal.  Nose: Nose normal.  Mouth/Throat: Oropharynx is clear and moist. No oropharyngeal exudate.  Eyes: Conjunctivae and EOM are normal. Pupils are equal, round, and reactive to light. Right eye exhibits no discharge. Left eye exhibits no discharge. No scleral icterus.  Neck: Normal range of motion. Neck supple. No tracheal deviation present. No thyromegaly present.  Cardiovascular: Normal rate, regular rhythm and normal heart sounds.  Exam reveals no gallop and no friction rub.   No murmur heard. Pulmonary/Chest: Effort normal and breath sounds normal. No accessory muscle usage. No tachypnea. No respiratory distress. He has no decreased breath sounds. He has no wheezes. He has no rhonchi. He has no rales. He exhibits no tenderness.  Musculoskeletal: Normal range of motion. He exhibits no edema.  Lymphadenopathy:     He has no cervical adenopathy.  Neurological: He is alert and oriented to person, place, and time. No cranial nerve deficit. Coordination normal.  Skin: Skin is warm and dry. No rash noted. He is not diaphoretic. No erythema. No pallor.  Psychiatric: He has a normal mood and affect. His behavior is normal. Judgment and thought content normal.          Assessment & Plan:   Problem List Items Addressed This Visit      Unprioritized   Abnormal CT scan, chest    Nodule stable on recent CT. Plan for repeat CT chest in 06/2016.      Candidal dermatitis    Continue prn Nystatin cream and powder.      Relevant Medications   nystatin (MYCOSTATIN/NYSTOP) 100000 UNIT/GM POWD   nystatin cream (MYCOSTATIN)   Essential hypertension, benign    BP Readings from Last 3 Encounters:  09/30/15 150/80  08/31/15 160/90  08/03/15 118/72   BP slightly high. Will monitor for now. Recheck in 4 weeks. Renal function with labs.      IBS (irritable bowel syndrome) - Primary    Symptoms of IBS well controlled with prn Bentyl. Follow up with GI as scheduled.      Relevant Orders   CBC with Differential/Platelet   Comprehensive metabolic panel   Severe obesity (BMI >= 40) (HCC)    Wt Readings from Last 3 Encounters:  09/30/15 284 lb 4 oz (128.935 kg)  08/31/15 283 lb (128.368 kg)  08/03/15 282 lb (127.914 kg)   Body mass index is 47.3 kg/(m^2). Discussed some options for appetite suppression. Will start Saxenda 0.6mg  daily. Discussed potential risks and benefits of this medication. Follow up in 4 weeks.      Relevant Medications   Liraglutide -Weight Management (SAXENDA) 18 MG/3ML SOPN       Return in about 4 weeks (around 10/28/2015) for Recheck.

## 2015-09-30 NOTE — Patient Instructions (Signed)
Labs today.  Follow up in 6 months and sooner as needed.  Look into coverage for Saxenda.  [email or web address]

## 2015-09-30 NOTE — Assessment & Plan Note (Signed)
Wt Readings from Last 3 Encounters:  09/30/15 284 lb 4 oz (128.935 kg)  08/31/15 283 lb (128.368 kg)  08/03/15 282 lb (127.914 kg)   Body mass index is 47.3 kg/(m^2). Discussed some options for appetite suppression. Will start Saxenda 0.6mg  daily. Discussed potential risks and benefits of this medication. Follow up in 4 weeks.

## 2015-09-30 NOTE — Assessment & Plan Note (Signed)
Nodule stable on recent CT. Plan for repeat CT chest in 06/2016.

## 2015-09-30 NOTE — Assessment & Plan Note (Signed)
Symptoms of IBS well controlled with prn Bentyl. Follow up with GI as scheduled.

## 2015-09-30 NOTE — Assessment & Plan Note (Signed)
BP Readings from Last 3 Encounters:  09/30/15 150/80  08/31/15 160/90  08/03/15 118/72   BP slightly high. Will monitor for now. Recheck in 4 weeks. Renal function with labs.

## 2015-09-30 NOTE — Assessment & Plan Note (Signed)
Continue prn Nystatin cream and powder.

## 2015-10-01 LAB — COMPREHENSIVE METABOLIC PANEL
ALT: 24 U/L (ref 0–53)
AST: 24 U/L (ref 0–37)
Albumin: 4.4 g/dL (ref 3.5–5.2)
Alkaline Phosphatase: 70 U/L (ref 39–117)
BUN: 17 mg/dL (ref 6–23)
CO2: 32 mEq/L (ref 19–32)
Calcium: 9.1 mg/dL (ref 8.4–10.5)
Chloride: 100 mEq/L (ref 96–112)
Creatinine, Ser: 1.04 mg/dL (ref 0.40–1.50)
GFR: 76.4 mL/min (ref >60.00)
Glucose, Bld: 109 mg/dL — ABNORMAL HIGH (ref 70–99)
Potassium: 4.1 mEq/L (ref 3.5–5.1)
Sodium: 138 mEq/L (ref 135–145)
Total Bilirubin: 0.3 mg/dL (ref 0.2–1.2)
Total Protein: 6.8 g/dL (ref 6.0–8.3)

## 2015-10-01 LAB — CBC WITH DIFFERENTIAL/PLATELET
Basophils Absolute: 0.1 10*3/uL (ref 0.0–0.1)
Basophils Relative: 0.8 % (ref 0.0–3.0)
Eosinophils Absolute: 0.1 10*3/uL (ref 0.0–0.7)
Eosinophils Relative: 0.6 % (ref 0.0–5.0)
HCT: 43 % (ref 39.0–52.0)
Hemoglobin: 14.4 g/dL (ref 13.0–17.0)
Lymphocytes Relative: 18.1 % (ref 12.0–46.0)
Lymphs Abs: 2.4 10*3/uL (ref 0.7–4.0)
MCHC: 33.5 g/dL (ref 30.0–36.0)
MCV: 87.8 fl (ref 78.0–100.0)
Monocytes Absolute: 0.8 10*3/uL (ref 0.1–1.0)
Monocytes Relative: 6.4 % (ref 3.0–12.0)
Neutro Abs: 9.7 10*3/uL — ABNORMAL HIGH (ref 1.4–7.7)
Neutrophils Relative %: 74.1 % (ref 43.0–77.0)
Platelets: 201 10*3/uL (ref 150.0–400.0)
RBC: 4.89 Mil/uL (ref 4.22–5.81)
RDW: 13.8 % (ref 11.5–15.5)
WBC: 13.1 10*3/uL — ABNORMAL HIGH (ref 4.0–10.5)

## 2015-10-02 ENCOUNTER — Other Ambulatory Visit: Payer: Self-pay

## 2015-10-02 DIAGNOSIS — B372 Candidiasis of skin and nail: Secondary | ICD-10-CM

## 2015-10-02 MED ORDER — NYSTATIN 100000 UNIT/GM EX CREA: 1.0000 "application " | TOPICAL_CREAM | Freq: Two times a day (BID) | CUTANEOUS | Status: DC

## 2015-10-20 ENCOUNTER — Telehealth: Payer: Self-pay | Admitting: Internal Medicine

## 2015-10-20 NOTE — Telephone Encounter (Signed)
Pt called about his insurance is requesting a call from the provider to explain the need of the medication Liraglutide -Weight Management (SAXENDA) 18 MG/3ML SOPN. Because his insurance is not wanting to approve it. Number is [id number]. To request pre approval. Pt would like a call if possible with the status. Thank You!

## 2015-10-21 ENCOUNTER — Other Ambulatory Visit: Payer: Self-pay

## 2015-10-21 ENCOUNTER — Telehealth: Payer: Self-pay | Admitting: *Deleted

## 2015-10-21 MED ORDER — INSULIN PEN NEEDLE 31G X 5 MM MISC
Status: DC
Start: 2015-10-21 — End: 2016-07-29

## 2015-10-21 NOTE — Telephone Encounter (Signed)
Completed PA for patient,   Spoke with Reginal Lutes, at [facility].  Approved form 10/18/15 through 03/16/16.    Spoke with the patient, he will go to the pharmacy to pick up the prescription today from the 30th of December and then can refill on 11/07/15.

## 2015-10-21 NOTE — Telephone Encounter (Signed)
Sent to [facility]

## 2015-10-21 NOTE — Telephone Encounter (Signed)
Patient stated that he will need a Rx for the injectors to apply the Saxenda.

## 2015-10-21 NOTE — Telephone Encounter (Signed)
Patient has requested a update on this progress. Please advise

## 2015-10-22 NOTE — Telephone Encounter (Signed)
Patient has requested a call back in reference to the [place], and the correct dosage. Please advise

## 2015-10-22 NOTE — Telephone Encounter (Signed)
Spoke with the patient, he verbalized understanding and will keep the follow up appointment.

## 2015-10-22 NOTE — Telephone Encounter (Signed)
He can increase Saxenda to 1.2mg  daily, if he has tolerated the 0.6mg  dose well for 1 week. He should keep scheduled follow up.

## 2015-10-22 NOTE — Telephone Encounter (Signed)
Spoke with the patient.  He was questioning if he is suppose to increase his saxenda dosage each week, he states that you told him to do 0.6mg  daily and that there was no discussion on increasing it.  Thanks. He also wanted to let you know that he is just getting started now on it, due to PA and all so he wants to know if he needs to reschedule his 4 week follow up as it will be only about 2 and a half weeks maybe.  Thanks

## 2015-10-24 ENCOUNTER — Other Ambulatory Visit: Payer: Self-pay | Admitting: Internal Medicine

## 2015-11-05 ENCOUNTER — Telehealth: Payer: Self-pay

## 2015-11-05 NOTE — Telephone Encounter (Signed)
Pt called about his weight loss medication and was unclear on wheather he should increase the dose, I stated that he should not change his dose unless advised by Dr.Walker./tvw

## 2015-11-10 ENCOUNTER — Telehealth: Payer: Self-pay | Admitting: Physician Assistant

## 2015-11-10 ENCOUNTER — Telehealth: Payer: Self-pay | Admitting: Internal Medicine

## 2015-11-10 NOTE — Telephone Encounter (Signed)
FYI

## 2015-11-10 NOTE — Telephone Encounter (Signed)
[facility] Patient Name: Paul Cook DOB: 18-Sep-1951 Initial Comment Caller states having left flank pain. Going into his back. Nurse Assessment Guidelines Guideline Title Affirmed Question Affirmed Notes Final Disposition User Comments CALLER STATES THAT HIS PAIN ON THE LEFT SIDE ON HIS ABD. HE IS ON TREATMENT FOR IRRITABLE BOWEL SYNDROME. PAIN HAS GRADUALLY GETTING BETTER. HE IS TAKING MEDICATION DICYCLOMINE 10 MG BEFORE HIS MEALS. HE STATES THAT HE WANTED TO BE SEEN TODAY. HE FEELS LIKE THAT THE PAIN IS TAPERING OFF AND HE IS WALKING AROUND NOW AND THE SENSATION IS ONLY THERE AND NO PAIN NOW. HE HAS AN APPT IN THE MORNING WITH HIS GI SPECIALIST. HE DOES NOT FEEL HE NEEDS TO BE SEEN TODAY ANYMORE. HE WAS INSTRUCTED THAT IF HE FEELS LIKE THE PAIN RETURNS FOR HIM TO CALL BACK TODAY. HE VERBALIZED UNDERSTANDING AND STATES THAT HE WILL DO SO.

## 2015-11-10 NOTE — Telephone Encounter (Signed)
Spoke with patient and he started having lower abdominal pain this morning. States he has IBS and diverticulosis. He is asking to be seen. No appointments available today. Scheduled with Dr. Havery Moros tomorrow at 9:00 AM. Patient wants to see if his PCM can see him today. If he does, he will call and cancel our OV.

## 2015-11-11 ENCOUNTER — Ambulatory Visit: Payer: Managed Care, Other (non HMO) | Admitting: Gastroenterology

## 2015-11-11 ENCOUNTER — Other Ambulatory Visit: Payer: Self-pay | Admitting: Internal Medicine

## 2015-11-11 ENCOUNTER — Ambulatory Visit (INDEPENDENT_AMBULATORY_CARE_PROVIDER_SITE_OTHER): Payer: Managed Care, Other (non HMO) | Admitting: Gastroenterology

## 2015-11-11 ENCOUNTER — Encounter: Payer: Self-pay | Admitting: Gastroenterology

## 2015-11-11 VITALS — BP 142/88 | HR 92 | Ht 65.0 in | Wt 277.6 lb

## 2015-11-11 DIAGNOSIS — Z86010 Personal history of colonic polyps: Secondary | ICD-10-CM | POA: Diagnosis not present

## 2015-11-11 DIAGNOSIS — R109 Unspecified abdominal pain: Secondary | ICD-10-CM

## 2015-11-11 DIAGNOSIS — K5730 Diverticulosis of large intestine without perforation or abscess without bleeding: Secondary | ICD-10-CM | POA: Diagnosis not present

## 2015-11-11 MED ORDER — CIPROFLOXACIN HCL 500 MG PO TABS: 500.0000 mg | ORAL_TABLET | Freq: Two times a day (BID) | ORAL | Status: DC

## 2015-11-11 MED ORDER — METRONIDAZOLE 500 MG PO TABS: 500.0000 mg | ORAL_TABLET | Freq: Three times a day (TID) | ORAL | Status: DC

## 2015-11-11 NOTE — Progress Notes (Signed)
HPI :  65 y/o male with a history of large sigmoid polyp noted in fall of 2015. Initially reported to have TVA removed and another very large sigmoid polyp. He was referred to [facility] who was able to remove it endoscopically, reported to have a tubular adenoma without malignancy and told he needed a 3 year follow up colonoscopy at that time. Since then he has been having some intermittent lower left sided abdominal cramping. Previously seen by our PA at the last visit and given Bentyl. He states recently he had been doing well on the Bentyl for some abdominal cramping. He reports having pain , mild cramps every 3 weeks, in the LLQ. He has a severe cramp once monthly in which he needs to lie down. He states most recently he ate some new cereal which he thought made him have severe pain in his left lower quadrant. This started yesterday, he had a severe cramping pain around 1030 AM. He lied down and eventually it got better. He reports he had a bowel movement and he felt much better after having a bowel movement. No blood in the stools. No fevers. He has had subtle diverticulitis earlier this year on a prior CT scan in June and given antibiotics for possible diverticulitis. He has had 2 CT scans in the past year for this issue.   He has a BM about twice per day. He takes miralax every morning. He is otherwise taking bentyl 3 times per day. He denies any blood in the stools. No unintentional weight loss. He has recently started a weight loss program. He recently lost 8 lbs.     Past Medical History  Diagnosis Date  . Asthma   . Depression   . GERD (gastroesophageal reflux disease)   . Hypertension   . Diverticulosis   . History of chicken pox   . Adenomatous colon polyp     tubular  . Adenomatous rectal polyp     tubular  . Lung nodule     CT 10/2014     Past Surgical History  Procedure Laterality Date  . Hernia repair      inguinal  . Colonoscopy w/ polypectomy  2015    [facility], benign    Family History  Problem Relation Age of Onset  . Depression Mother   . Hypertension Father   . Heart disease Father   . Stroke Father   . Cancer Maternal Grandmother     ovarian?  . Cancer Paternal Grandfather     prostate   Social History  Substance Use Topics  . Smoking status: Never Smoker   . Smokeless tobacco: Never Used  . Alcohol Use: No   Current Outpatient Prescriptions  Medication Sig Dispense Refill  . albuterol (PROVENTIL HFA;VENTOLIN HFA) 108 (90 BASE) MCG/ACT inhaler Inhale 2 puffs into the lungs every 6 (six) hours as needed for wheezing or shortness of breath. 1 Inhaler 0  . Alpha-D-Galactosidase (BEANO PO) Take by mouth.    Marland Kitchen buPROPion (WELLBUTRIN XL) 150 MG 24 hr tablet Take 1 tablet (150 mg total) by mouth daily. 30 tablet 3  . dicyclomine (BENTYL) 10 MG capsule Take 2 capsules (20 mg total) by mouth 3 (three) times daily before meals. 180 capsule 3  . gabapentin (NEURONTIN) 600 MG tablet TAKE 1 TABLET THREE TIMES A DAY 270 tablet 0  . hydrocortisone-pramoxine (ANALPRAM-HC) 2.5-1 % rectal cream APPLY RECTALLY THREE TIMES DAILY 30 g 3  . Insulin Pen Needle 31G X 5 MM  MISC Use as directed daily with Saxenda prescription 30 each 5  . Liraglutide -Weight Management (SAXENDA) 18 MG/3ML SOPN Inject 0.6 mg into the skin daily. 3 mL 3  . lisinopril-hydrochlorothiazide (PRINZIDE,ZESTORETIC) 20-25 MG per tablet TAKE 1 TABLET DAILY 90 tablet 1  . LORazepam (ATIVAN) 0.5 MG tablet TAKE 1 TO 2 TABLETS BY MOUTH AT BEDTIME 60 tablet 0  . MISC NATURAL PRODUCTS PO Take by mouth. Carrot juice with Dandelion    . mometasone (NASONEX) 50 MCG/ACT nasal spray USE 2 SPRAYS NASALLY DAILY 51 g 2  . nystatin (MYCOSTATIN/NYSTOP) 100000 UNIT/GM POWD Apply powder to groin twice daily 180 g 4  . nystatin cream (MYCOSTATIN) Apply 1 application topically 2 (two) times daily. 90 g 4  . Probiotic Product (PROBIOTIC DAILY PO) Take by mouth.    . ranitidine (ZANTAC) 150 MG tablet Take 1 tablet  (150 mg total) by mouth 2 (two) times daily. 180 tablet 3  . trazodone (DESYREL) 300 MG tablet TAKE 1 TABLET AT BEDTIME 90 tablet 1  . zolpidem (AMBIEN) 5 MG tablet Take 1-2 tablets (5-10 mg total) by mouth at bedtime as needed. for sleep 180 tablet 1  . ciprofloxacin (CIPRO) 500 MG tablet Take 1 tablet (500 mg total) by mouth 2 (two) times daily. 28 tablet 0  . metroNIDAZOLE (FLAGYL) 500 MG tablet Take 1 tablet (500 mg total) by mouth 3 (three) times daily. 42 tablet 0  . rOPINIRole (REQUIP) 0.25 MG tablet TAKE 3 TABLETS DAILY AS DIRECTED 270 tablet 11   No current facility-administered medications for this visit.   Allergies  Allergen Reactions  . Nsaids Other (See Comments)    GI Issues     Review of Systems: All systems reviewed and negative except where noted in HPI.   Lab Results  Component Value Date   WBC 13.1* 09/30/2015   HGB 14.4 09/30/2015   HCT 43.0 09/30/2015   MCV 87.8 09/30/2015   PLT 201.0 09/30/2015    Lab Results  Component Value Date   CREATININE 1.04 09/30/2015   BUN 17 09/30/2015   NA 138 09/30/2015   K 4.1 09/30/2015   CL 100 09/30/2015   CO2 32 09/30/2015    Lab Results  Component Value Date   ALT 24 09/30/2015   AST 24 09/30/2015   ALKPHOS 70 09/30/2015   BILITOT 0.3 09/30/2015     Physical Exam: BP 142/88 mmHg  Pulse 92  Ht 5\' 5"  (1.651 m)  Wt 277 lb 9.6 oz (125.919 kg)  BMI 46.20 kg/m2 Constitutional: Pleasant,well-developed, male in no acute distress. HEENT: Normocephalic and atraumatic. Conjunctivae are normal. No scleral icterus. Neck supple.  Cardiovascular: Normal rate, regular rhythm.  Pulmonary/chest: Effort normal and breath sounds normal. No wheezing, rales or rhonchi. Abdominal: Soft, nondistended, mild tenderness to palpation without rebound or guarding . Bowel sounds active throughout. There are no masses palpable. No hepatomegaly. Extremities: no edema Lymphadenopathy: No cervical adenopathy noted. Neurological:  Alert and oriented to person place and time. Skin: Skin is warm and dry. No rashes noted. Psychiatric: Normal mood and affect. Behavior is normal.   ASSESSMENT AND PLAN: 65 y/o male with a history of TVA and large adenoma of the sigmoid colon, removed in 2015, told to have a follow up colonoscopy at [facility] in 2018, with a history of IBS, and questionable history of diverticulitis of the sigmoid colon, here for reassessment. In recent months he has been having ongoing intermittent LLQ cramping, usually mild and relived with Bentyl, but  had a few severe episodes of pain. While he could have IBS driving these symptoms, other possibilities are symptomatic uncomplicated diverticular disease (SUDD) with visceral hypersensitivity, or segmental colitis associated with diverticular disease (SCAD). While I don't think he would have a recurrent polyp causing this, it's possible given the size of his prior lesion, although this was thought to have been completely resected. I recommend a daily fiber supplement such as Citrucel (hopefully will not cause bloating), and he should continue to take his Bentyl which does help. He is traveling to [place] today and wanted to make sure he was not having diverticulitis. Given he feels well today without his pain, I doubt he has this, but I wrote him a prescription for Ciprofloxacin and Flagyl to take in case he has recurrence of pain that persist as this could treat both diverticulitis and SCAD. If his symptoms persist I otherwise offered him a colonoscopy or flex sig to re-evaluate the area in light of the large polyp removed and his ongoing symptoms. He declined an endoscopy at this time and wishes to monitor his symptoms. If they recur or persist he should contact me for reassessment. He agreed.   Zena Cellar, MD [facility] Pager [phone]

## 2015-11-11 NOTE — Patient Instructions (Signed)
Thank you for choosing [facility] to care for you.  

## 2015-11-16 ENCOUNTER — Ambulatory Visit (INDEPENDENT_AMBULATORY_CARE_PROVIDER_SITE_OTHER): Payer: Managed Care, Other (non HMO) | Admitting: Internal Medicine

## 2015-11-16 ENCOUNTER — Encounter: Payer: Self-pay | Admitting: Internal Medicine

## 2015-11-16 VITALS — BP 124/78 | HR 68 | Temp 98.2°F | Ht 65.0 in | Wt 280.5 lb

## 2015-11-16 DIAGNOSIS — I10 Essential (primary) hypertension: Secondary | ICD-10-CM | POA: Diagnosis not present

## 2015-11-16 DIAGNOSIS — G2581 Restless legs syndrome: Secondary | ICD-10-CM

## 2015-11-16 DIAGNOSIS — K589 Irritable bowel syndrome without diarrhea: Secondary | ICD-10-CM | POA: Diagnosis not present

## 2015-11-16 MED ORDER — DICYCLOMINE HCL 10 MG PO CAPS: 20.0000 mg | ORAL_CAPSULE | Freq: Three times a day (TID) | ORAL | Status: DC

## 2015-11-16 MED ORDER — ROPINIROLE HCL 0.5 MG PO TABS: 1.5000 mg | ORAL_TABLET | Freq: Every day | ORAL | Status: DC

## 2015-11-16 NOTE — Assessment & Plan Note (Signed)
Recently seen by GI. Given Rx for Cipro and Flagyl in case of recurrent symptoms of abdominal pain. Will continue to monitor. Continue Bentyl prn.

## 2015-11-16 NOTE — Patient Instructions (Addendum)
Increase Saxenda to 1.8mg  daily. Then, in 2 weeks increase to 2.4mg  daily if tolerating well.  Increase Requip to 1.5mg  daily at bedtime.  Follow up in 4 weeks.

## 2015-11-16 NOTE — Progress Notes (Signed)
Subjective:    Patient ID: Paul Cook, male    DOB: 03/11/1951, 64 y.o.   MRN: [id number]  HPI  65YO male presents for follow up.  Last seen in December for IBS. Recently seen by GI and given Rx for Cipro and Flagyl to start if symptoms of abdominal pain recurrent, suggesting diverticulitis. Continues on Bentyl with improvement in symptoms of abdominal cramping. No severe or persistent pain. No diarrhea. Plans to wait another year until colonoscopy.  Obesity - Started on Saxenda. Currently at 1.2mg  dosing. Tolerating well. Notes some improvement in appetite on this medication.  Restless legs - Requip was helpful, however now taking up to 6 tablets for relief.    Wt Readings from Last 3 Encounters:  11/16/15 280 lb 8 oz (127.234 kg)  11/11/15 277 lb 9.6 oz (125.919 kg)  09/30/15 284 lb 4 oz (128.935 kg)   BP Readings from Last 3 Encounters:  11/16/15 124/78  11/11/15 142/88  09/30/15 150/80    Past Medical History  Diagnosis Date  . Asthma   . Depression   . GERD (gastroesophageal reflux disease)   . Hypertension   . Diverticulosis   . History of chicken pox   . Adenomatous colon polyp     tubular  . Adenomatous rectal polyp     tubular  . Lung nodule     CT 10/2014   Family History  Problem Relation Age of Onset  . Depression Mother   . Hypertension Father   . Heart disease Father   . Stroke Father   . Cancer Maternal Grandmother     ovarian?  . Cancer Paternal Grandfather     prostate   Past Surgical History  Procedure Laterality Date  . Hernia repair      inguinal  . Colonoscopy w/ polypectomy  2015    [facility], benign   Social History   Social History  . Marital Status: Married    Spouse Name: N/A  . Number of Children: N/A  . Years of Education: N/A   Social History Main Topics  . Smoking status: Never Smoker   . Smokeless tobacco: Never Used  . Alcohol Use: No  . Drug Use: No  . Sexual Activity: Not Asked   Other Topics Concern  .  None   Social History Narrative   Lives in [place] with wife, [name]. 1 daughter in [place].      Work - Optician, dispensing, now Animal nutritionist                Review of Systems  Constitutional: Negative for fever, chills, activity change, appetite change, fatigue and unexpected weight change.  Eyes: Negative for visual disturbance.  Respiratory: Negative for cough and shortness of breath.   Cardiovascular: Negative for chest pain, palpitations and leg swelling.  Gastrointestinal: Negative for nausea, vomiting, abdominal pain, diarrhea, constipation, blood in stool and abdominal distention.  Genitourinary: Negative for dysuria, urgency and difficulty urinating.  Musculoskeletal: Negative for arthralgias and gait problem.  Skin: Negative for color change and rash.  Hematological: Negative for adenopathy.  Psychiatric/Behavioral: Positive for sleep disturbance. Negative for dysphoric mood. The patient is not nervous/anxious.        Objective:    BP 124/78 mmHg  Pulse 68  Temp(Src) 98.2 F (36.8 C) (Oral)  Ht 5\' 5"  (1.651 m)  Wt 280 lb 8 oz (127.234 kg)  BMI 46.68 kg/m2  SpO2 96% Physical Exam  Constitutional: He is oriented to person, place, and time.  He appears well-developed and well-nourished. No distress.  HENT:  Head: Normocephalic and atraumatic.  Right Ear: External ear normal.  Left Ear: External ear normal.  Nose: Nose normal.  Mouth/Throat: Oropharynx is clear and moist. No oropharyngeal exudate.  Eyes: Conjunctivae and EOM are normal. Pupils are equal, round, and reactive to light. Right eye exhibits no discharge. Left eye exhibits no discharge. No scleral icterus.  Neck: Normal range of motion. Neck supple. No tracheal deviation present. No thyromegaly present.  Cardiovascular: Normal rate, regular rhythm and normal heart sounds.  Exam reveals no gallop and no friction rub.   No murmur heard. Pulmonary/Chest: Effort normal and breath sounds normal. No  accessory muscle usage. No tachypnea. No respiratory distress. He has no decreased breath sounds. He has no wheezes. He has no rhonchi. He has no rales. He exhibits no tenderness.  Musculoskeletal: Normal range of motion. He exhibits no edema.  Lymphadenopathy:    He has no cervical adenopathy.  Neurological: He is alert and oriented to person, place, and time. No cranial nerve deficit. Coordination normal.  Skin: Skin is warm and dry. No rash noted. He is not diaphoretic. No erythema. No pallor.  Psychiatric: He has a normal mood and affect. His behavior is normal. Judgment and thought content normal.          Assessment & Plan:   Problem List Items Addressed This Visit      Unprioritized   Essential hypertension, benign    BP Readings from Last 3 Encounters:  11/16/15 124/78  11/11/15 142/88  09/30/15 150/80   BP improved today. Recent renal function normal. Continue current medication.      IBS (irritable bowel syndrome) - Primary    Recently seen by GI. Given Rx for Cipro and Flagyl in case of recurrent symptoms of abdominal pain. Will continue to monitor. Continue Bentyl prn.      Relevant Medications   dicyclomine (BENTYL) 10 MG capsule   Restless legs    Symptoms poorly controlled. Increase Requip to 1.5mg  at bedtime      Severe obesity (BMI >= 40) (HCC)    Wt Readings from Last 3 Encounters:  11/16/15 280 lb 8 oz (127.234 kg)  11/11/15 277 lb 9.6 oz (125.919 kg)  09/30/15 284 lb 4 oz (128.935 kg)   Encouraged healthy diet and exercise. Continue Saxenda to help with appetite suppression.          Return in about 4 weeks (around 12/14/2015) for Recheck.

## 2015-11-16 NOTE — Assessment & Plan Note (Signed)
BP Readings from Last 3 Encounters:  11/16/15 124/78  11/11/15 142/88  09/30/15 150/80   BP improved today. Recent renal function normal. Continue current medication.

## 2015-11-16 NOTE — Assessment & Plan Note (Signed)
Symptoms poorly controlled. Increase Requip to 1.5mg  at bedtime

## 2015-11-16 NOTE — Progress Notes (Signed)
Pre visit review using our clinic review tool, if applicable. No additional management support is needed unless otherwise documented below in the visit note. 

## 2015-11-16 NOTE — Assessment & Plan Note (Addendum)
Wt Readings from Last 3 Encounters:  11/16/15 280 lb 8 oz (127.234 kg)  11/11/15 277 lb 9.6 oz (125.919 kg)  09/30/15 284 lb 4 oz (128.935 kg)   Encouraged healthy diet and exercise. Continue Saxenda to help with appetite suppression.

## 2015-11-17 ENCOUNTER — Telehealth: Payer: Self-pay | Admitting: Internal Medicine

## 2015-11-17 ENCOUNTER — Other Ambulatory Visit: Payer: Self-pay

## 2015-11-17 MED ORDER — ROPINIROLE HCL 0.5 MG PO TABS: 1.5000 mg | ORAL_TABLET | Freq: Every day | ORAL | Status: DC

## 2015-11-17 NOTE — Telephone Encounter (Signed)
Requip sent to wrong pharmacy, [facility]

## 2015-11-17 NOTE — Telephone Encounter (Signed)
Pt called about his medication rOPINIRole (REQUIP) 0.5 MG tablet needs to be a 90 day supply. Pharmacy is [facility], [place]. Call pt @ [phone]. Thank you!

## 2015-11-17 NOTE — Telephone Encounter (Signed)
[facility].

## 2015-11-23 ENCOUNTER — Other Ambulatory Visit: Payer: Self-pay | Admitting: Internal Medicine

## 2015-11-24 ENCOUNTER — Ambulatory Visit: Payer: Managed Care, Other (non HMO) | Admitting: Gastroenterology

## 2015-12-03 ENCOUNTER — Telehealth: Payer: Self-pay | Admitting: Internal Medicine

## 2015-12-03 MED ORDER — DICYCLOMINE HCL 10 MG PO CAPS: 20.0000 mg | ORAL_CAPSULE | Freq: Three times a day (TID) | ORAL | Status: DC

## 2015-12-03 MED ORDER — ROPINIROLE HCL 0.5 MG PO TABS: 1.5000 mg | ORAL_TABLET | Freq: Every day | ORAL | Status: DC

## 2015-12-03 NOTE — Telephone Encounter (Signed)
Pt would like the following prescriptions to sent to his mail order RX. rOPINIRole (REQUIP) 0.5 MG tablet, dicyclomine (BENTYL) 10 MG capsule. This is through [facility] and Pt says this must be a 90 day supply. Pts number is [phone].

## 2015-12-03 NOTE — Telephone Encounter (Signed)
Refilled per request.

## 2015-12-04 ENCOUNTER — Telehealth: Payer: Self-pay | Admitting: *Deleted

## 2015-12-04 NOTE — Telephone Encounter (Signed)
Both meds were refilled yesterday.

## 2015-12-04 NOTE — Telephone Encounter (Signed)
Patient has requested a medication refill ropinirole and dicyclomine , both 90 day supply [facility]

## 2015-12-09 MED ORDER — DICYCLOMINE HCL 10 MG PO CAPS: 20.0000 mg | ORAL_CAPSULE | Freq: Three times a day (TID) | ORAL | Status: DC

## 2015-12-09 MED ORDER — ROPINIROLE HCL 0.5 MG PO TABS: 1.5000 mg | ORAL_TABLET | Freq: Every day | ORAL | Status: DC

## 2015-12-09 NOTE — Telephone Encounter (Signed)
I have sent them again.  Thanks

## 2015-12-09 NOTE — Telephone Encounter (Signed)
Pt called stating that [facility] had not received the refill request yet.Marland Kitchen Please advise pt

## 2015-12-10 ENCOUNTER — Telehealth: Payer: Self-pay | Admitting: Internal Medicine

## 2015-12-10 MED ORDER — ROPINIROLE HCL 0.5 MG PO TABS: 1.5000 mg | ORAL_TABLET | Freq: Every day | ORAL | Status: DC

## 2015-12-10 MED ORDER — DICYCLOMINE HCL 10 MG PO CAPS
20.0000 mg | ORAL_CAPSULE | Freq: Three times a day (TID) | ORAL | Status: DC
Start: 2015-12-10 — End: 2016-05-20

## 2015-12-10 NOTE — Telephone Encounter (Signed)
Pt called and stated that the prescription of rOPINIRole (REQUIP) 0.5 MG tablet and dicyclomine (BENTYL) 10 MG capsule both should be 90 day supply and they was sent 30 day supply. Pharmacy is [facility], [place]. Call pt @ [phone]. Thank you!

## 2015-12-10 NOTE — Telephone Encounter (Signed)
Please advise, thanks.

## 2015-12-10 NOTE — Telephone Encounter (Signed)
Resent in

## 2015-12-16 ENCOUNTER — Other Ambulatory Visit: Payer: Self-pay | Admitting: Internal Medicine

## 2015-12-16 NOTE — Telephone Encounter (Signed)
Refilled in November. Please advise?

## 2015-12-18 ENCOUNTER — Other Ambulatory Visit: Payer: Self-pay | Admitting: Internal Medicine

## 2015-12-18 ENCOUNTER — Ambulatory Visit (INDEPENDENT_AMBULATORY_CARE_PROVIDER_SITE_OTHER): Payer: Managed Care, Other (non HMO) | Admitting: Internal Medicine

## 2015-12-18 ENCOUNTER — Encounter: Payer: Self-pay | Admitting: Internal Medicine

## 2015-12-18 DIAGNOSIS — I10 Essential (primary) hypertension: Secondary | ICD-10-CM | POA: Diagnosis not present

## 2015-12-18 DIAGNOSIS — K219 Gastro-esophageal reflux disease without esophagitis: Secondary | ICD-10-CM

## 2015-12-18 DIAGNOSIS — G4700 Insomnia, unspecified: Secondary | ICD-10-CM

## 2015-12-18 DIAGNOSIS — K589 Irritable bowel syndrome without diarrhea: Secondary | ICD-10-CM

## 2015-12-18 MED ORDER — PANTOPRAZOLE SODIUM 40 MG PO TBEC: 40.0000 mg | DELAYED_RELEASE_TABLET | Freq: Every day | ORAL | Status: DC

## 2015-12-18 MED ORDER — LIRAGLUTIDE -WEIGHT MANAGEMENT 18 MG/3ML ~~LOC~~ SOPN: 2.4000 mg | PEN_INJECTOR | Freq: Every day | SUBCUTANEOUS | Status: DC

## 2015-12-18 NOTE — Assessment & Plan Note (Signed)
No recent IBS symptoms. Will continue to monitor. Continue PRN Bentyl.

## 2015-12-18 NOTE — Assessment & Plan Note (Signed)
BP Readings from Last 3 Encounters:  12/18/15 104/63  11/16/15 124/78  11/11/15 142/88   BP well controlled. Will continue current medication. Renal function at next visit.

## 2015-12-18 NOTE — Progress Notes (Signed)
Pre visit review using our clinic review tool, if applicable. No additional management support is needed unless otherwise documented below in the visit note. 

## 2015-12-18 NOTE — Patient Instructions (Addendum)
Add Pantoprazole 40mg  daily.  Continue Saxenda 2.4mg  daily.  Follow up in 3 months.

## 2015-12-18 NOTE — Assessment & Plan Note (Signed)
Recent worsening GERD symptoms with use of Saxenda. Will continue Zantac and add Pantoprazole. Follow up in 3 months and prn.

## 2015-12-18 NOTE — Assessment & Plan Note (Signed)
Wt Readings from Last 3 Encounters:  12/18/15 276 lb (125.193 kg)  11/16/15 280 lb 8 oz (127.234 kg)  11/11/15 277 lb 9.6 oz (125.919 kg)   Congratulated pt on weight loss. Encouraged continued healthy diet and exercise. Continue Saxenda to help with appetite.

## 2015-12-18 NOTE — Assessment & Plan Note (Signed)
Symptoms well controlled with Ambien 5-10mg  as needed. He understands the risks of this medication.

## 2015-12-18 NOTE — Progress Notes (Signed)
Subjective:    Patient ID: Paul Cook, male    DOB: 05-02-1951, 65 y.o.   MRN: [id number]  HPI  65YO male presents for follow up.  Obesity - On Saxenda 2.4mg  daily. No side effects noted. Eating less. Hunger less intense. Some increased reflux symptoms. Taking Zantac twice daily.  Insomnia - Over the last week, taking 10mg  of Ambien daily to sleep. Notes some increased stress with dealing with issues with his daughter.  No recent issues with IBS or diverticulitis.  Wt Readings from Last 3 Encounters:  12/18/15 276 lb (125.193 kg)  11/16/15 280 lb 8 oz (127.234 kg)  11/11/15 277 lb 9.6 oz (125.919 kg)   BP Readings from Last 3 Encounters:  12/18/15 104/63  11/16/15 124/78  11/11/15 142/88    Past Medical History  Diagnosis Date  . Asthma   . Depression   . GERD (gastroesophageal reflux disease)   . Hypertension   . Diverticulosis   . History of chicken pox   . Adenomatous colon polyp     tubular  . Adenomatous rectal polyp     tubular  . Lung nodule     CT 10/2014   Family History  Problem Relation Age of Onset  . Depression Mother   . Hypertension Father   . Heart disease Father   . Stroke Father   . Cancer Maternal Grandmother     ovarian?  . Cancer Paternal Grandfather     prostate   Past Surgical History  Procedure Laterality Date  . Hernia repair      inguinal  . Colonoscopy w/ polypectomy  2015    [facility], benign   Social History   Social History  . Marital Status: Married    Spouse Name: N/A  . Number of Children: N/A  . Years of Education: N/A   Social History Main Topics  . Smoking status: Never Smoker   . Smokeless tobacco: Never Used  . Alcohol Use: No  . Drug Use: No  . Sexual Activity: Not Asked   Other Topics Concern  . None   Social History Narrative   Lives in [place] with wife, Paul Cook. 1 daughter in [place].      Work - Optician, dispensing, now Animal nutritionist                Review of Systems  Constitutional:  Negative for fever, chills, activity change, appetite change, fatigue and unexpected weight change.  Eyes: Negative for visual disturbance.  Respiratory: Negative for cough and shortness of breath.   Cardiovascular: Negative for chest pain, palpitations and leg swelling.  Gastrointestinal: Negative for nausea, vomiting, abdominal pain, diarrhea, constipation and abdominal distention.  Genitourinary: Negative for dysuria, urgency and difficulty urinating.  Musculoskeletal: Negative for arthralgias and gait problem.  Skin: Negative for color change and rash.  Hematological: Negative for adenopathy.  Psychiatric/Behavioral: Positive for sleep disturbance. Negative for dysphoric mood. The patient is not nervous/anxious.        Objective:    BP 104/63 mmHg  Pulse 68  Temp(Src) 98.3 F (36.8 C) (Oral)  Ht 5\' 5"  (1.651 m)  Wt 276 lb (125.193 kg)  BMI 45.93 kg/m2  SpO2 96% Physical Exam  Constitutional: He is oriented to person, place, and time. He appears well-developed and well-nourished. No distress.  HENT:  Head: Normocephalic and atraumatic.  Right Ear: External ear normal.  Left Ear: External ear normal.  Nose: Nose normal.  Mouth/Throat: Oropharynx is clear and moist. No oropharyngeal exudate.  Eyes: Conjunctivae and EOM are normal. Pupils are equal, round, and reactive to light. Right eye exhibits no discharge. Left eye exhibits no discharge. No scleral icterus.  Neck: Normal range of motion. Neck supple. No tracheal deviation present. No thyromegaly present.  Cardiovascular: Normal rate, regular rhythm and normal heart sounds.  Exam reveals no gallop and no friction rub.   No murmur heard. Pulmonary/Chest: Effort normal and breath sounds normal. No accessory muscle usage. No tachypnea. No respiratory distress. He has no decreased breath sounds. He has no wheezes. He has no rhonchi. He has no rales. He exhibits no tenderness.  Musculoskeletal: Normal range of motion. He exhibits  no edema.  Lymphadenopathy:    He has no cervical adenopathy.  Neurological: He is alert and oriented to person, place, and time. No cranial nerve deficit. Coordination normal.  Skin: Skin is warm and dry. No rash noted. He is not diaphoretic. No erythema. No pallor.  Psychiatric: He has a normal mood and affect. His behavior is normal. Judgment and thought content normal.          Assessment & Plan:   Problem List Items Addressed This Visit      Unprioritized   Essential hypertension, benign    BP Readings from Last 3 Encounters:  12/18/15 104/63  11/16/15 124/78  11/11/15 142/88   BP well controlled. Will continue current medication. Renal function at next visit.      GERD (gastroesophageal reflux disease)    Recent worsening GERD symptoms with use of Saxenda. Will continue Zantac and add Pantoprazole. Follow up in 3 months and prn.      Relevant Medications   pantoprazole (PROTONIX) 40 MG tablet   IBS (irritable bowel syndrome)    No recent IBS symptoms. Will continue to monitor. Continue PRN Bentyl.      Relevant Medications   pantoprazole (PROTONIX) 40 MG tablet   Insomnia    Symptoms well controlled with Ambien 5-10mg  as needed. He understands the risks of this medication.      Severe obesity (BMI >= 40) (HCC) - Primary    Wt Readings from Last 3 Encounters:  12/18/15 276 lb (125.193 kg)  11/16/15 280 lb 8 oz (127.234 kg)  11/11/15 277 lb 9.6 oz (125.919 kg)   Congratulated pt on weight loss. Encouraged continued healthy diet and exercise. Continue Saxenda to help with appetite.      Relevant Medications   Liraglutide -Weight Management (SAXENDA) 18 MG/3ML SOPN       Return in about 3 months (around 03/19/2016) for Recheck.  Ronette Deter, MD Internal Medicine [facility]

## 2015-12-21 ENCOUNTER — Telehealth: Payer: Self-pay | Admitting: Internal Medicine

## 2015-12-21 ENCOUNTER — Telehealth: Payer: Self-pay | Admitting: *Deleted

## 2015-12-21 MED ORDER — ZOLPIDEM TARTRATE 5 MG PO TABS: 5.0000 mg | ORAL_TABLET | Freq: Every evening | ORAL | Status: DC | PRN

## 2015-12-21 NOTE — Telephone Encounter (Signed)
Pt is calling about a refill on his zolpidem (AMBIEN) 5 MG tablet. He is concerned he will not get the refill on time by mail. Please call and advise pt.  Thank you.

## 2015-12-21 NOTE — Telephone Encounter (Signed)
Medication refill for zolpidem 90 day supply Pharmacy [facility].

## 2015-12-21 NOTE — Telephone Encounter (Signed)
Placed in quick sign folder to sign off

## 2015-12-21 NOTE — Telephone Encounter (Signed)
Fine to fill. 

## 2015-12-21 NOTE — Telephone Encounter (Signed)
Notified pt rx already sent to pharmacy

## 2015-12-25 ENCOUNTER — Other Ambulatory Visit: Payer: Self-pay | Admitting: Internal Medicine

## 2015-12-25 MED ORDER — ZOLPIDEM TARTRATE 5 MG PO TABS: 5.0000 mg | ORAL_TABLET | Freq: Every evening | ORAL | Status: DC | PRN

## 2015-12-25 NOTE — Telephone Encounter (Signed)
Pt is requesting a short term prescription until [facility] can deliver his meds. Please advise, thanks

## 2015-12-25 NOTE — Telephone Encounter (Signed)
Pt called about needing his prescription of zolpidem (AMBIEN) 5 MG tablet sent to [facility] [id number] - [place], [place] wants the mg to be 10 mg 1 pill. Pt states he will not get the medication in time [facility]. Call pt @ [phone]. Thank you!

## 2015-12-25 NOTE — Telephone Encounter (Signed)
Fine to call in 30 day supply to local pharmacy

## 2015-12-25 NOTE — Telephone Encounter (Signed)
30 day refill called into [facility], pt notified

## 2015-12-28 NOTE — Telephone Encounter (Signed)
PA sent on cover my meds today.

## 2015-12-28 NOTE — Telephone Encounter (Signed)
PA is in process. 

## 2015-12-28 NOTE — Telephone Encounter (Signed)
Pt called back stating his insurance would not pay for the medication. So pt paid out of pocket. Pt states he was told to ask Dr Gilford Rile a special request PLA so insurance would pay for 10 mg. And the other prescription cancelled. Pharmacy is  [facility] [id number] - [place], [place]. Call pt @ [phone]. Thank you!

## 2015-12-29 ENCOUNTER — Telehealth: Payer: Self-pay

## 2015-12-29 NOTE — Telephone Encounter (Signed)
PA for Ambien completed on Cover my meds.

## 2015-12-30 NOTE — Telephone Encounter (Signed)
PA approved from 11/29/2015-03/28/2016 for Zolpidem tartrate.

## 2016-01-04 ENCOUNTER — Other Ambulatory Visit: Payer: Self-pay

## 2016-01-04 MED ORDER — ZOLPIDEM TARTRATE 10 MG PO TABS: 10.0000 mg | ORAL_TABLET | Freq: Every evening | ORAL | Status: DC | PRN

## 2016-01-12 ENCOUNTER — Other Ambulatory Visit: Payer: Self-pay | Admitting: Internal Medicine

## 2016-01-12 ENCOUNTER — Telehealth: Payer: Self-pay | Admitting: Internal Medicine

## 2016-01-12 NOTE — Telephone Encounter (Signed)
Pt called needing a PA and refill  for Liraglutide -Weight Management (SAXENDA) 18 MG/3ML SOPN.Marland Kitchen Please advise pt

## 2016-01-12 NOTE — Telephone Encounter (Signed)
Notified the pt that the pharmacy stated they can not put in for another PA since the pt just picked up medication, when it gets close to time for a refill, the pharmacy will send in a request for a PA

## 2016-01-18 ENCOUNTER — Telehealth: Payer: Self-pay | Admitting: Internal Medicine

## 2016-01-18 NOTE — Telephone Encounter (Signed)
Pt called about having diarrhea which started this morning. Pt wanted to know if he should not taking the Liraglutide -Weight Management (SAXENDA) 18 MG/3ML SOPN while he has diarrhea? Call pt @ [phone]. Thank you!

## 2016-01-18 NOTE — Telephone Encounter (Signed)
Please advise. Thanks.  

## 2016-01-18 NOTE — Telephone Encounter (Signed)
Spoke with the patient, he verbalized understanding.

## 2016-01-18 NOTE — Telephone Encounter (Signed)
It is fine to take the [place] when having diarrhea, however if he is feeling nauseous, then I would hold the medication.

## 2016-01-22 ENCOUNTER — Other Ambulatory Visit: Payer: Self-pay | Admitting: Internal Medicine

## 2016-02-22 ENCOUNTER — Encounter: Payer: Self-pay | Admitting: Gastroenterology

## 2016-02-25 ENCOUNTER — Telehealth: Payer: Self-pay | Admitting: Gastroenterology

## 2016-02-25 MED ORDER — ONDANSETRON HCL 4 MG PO TABS: 4.0000 mg | ORAL_TABLET | Freq: Three times a day (TID) | ORAL | Status: DC | PRN

## 2016-02-25 NOTE — Telephone Encounter (Signed)
Patient given recommendations. Rx sent to pharmacy. He feels it was diverticulitis. How long should he wait before rescheduling colonoscopy?

## 2016-02-25 NOTE — Telephone Encounter (Signed)
Patient states he got a stomach flu about a month ago. He continued to have diarrhea and cramping so he started taking the Flagyl and Cipro that Dr. Havery Moros gave him in January at his [facility]. He reports the symptoms have gone away since he started the antibiotics on 02/18/16. He is now having nausea and is asking for something for this. He has scheduled a colonoscopy on 03/15/16 also. Not sure if he needs to reschedule this to a later date. Please, advise.

## 2016-02-25 NOTE — Telephone Encounter (Signed)
He should complete the course of antibiotics. The flagyl can cause nausea. We can give some zofran to use PRN. Were these his typical symptoms of diverticulitis or different? If he did not think he had diverticulitis I think it's okay to proceed with colonoscopy as scheduled.

## 2016-02-26 NOTE — Telephone Encounter (Signed)
It should be rescheduled for 6 weeks out from this occurrence, so end of June, early July

## 2016-02-26 NOTE — Telephone Encounter (Signed)
Spoke with patient and rescheduled his procedure.

## 2016-02-29 ENCOUNTER — Ambulatory Visit (AMBULATORY_SURGERY_CENTER): Payer: Self-pay | Admitting: *Deleted

## 2016-02-29 VITALS — Ht 65.0 in | Wt 269.0 lb

## 2016-02-29 DIAGNOSIS — Z86010 Personal history of colonic polyps: Secondary | ICD-10-CM

## 2016-02-29 MED ORDER — NA SULFATE-K SULFATE-MG SULF 17.5-3.13-1.6 GM/177ML PO SOLN: 1.0000 | Freq: Once | ORAL | Status: DC

## 2016-02-29 NOTE — Progress Notes (Signed)
No egg or soy allergy known to patient  No issues with past sedation with any surgeries  or procedures, no intubation problems - after colon surgery at [facility];ast year pt was disoriented and angry post op  No diet pills per patient No home 02 use per patient  No blood thinners per patient  Pt denies issues with constipation

## 2016-03-01 ENCOUNTER — Encounter: Payer: Self-pay | Admitting: Gastroenterology

## 2016-03-15 ENCOUNTER — Encounter: Payer: Managed Care, Other (non HMO) | Admitting: Gastroenterology

## 2016-03-15 ENCOUNTER — Other Ambulatory Visit: Payer: Self-pay | Admitting: Internal Medicine

## 2016-03-23 ENCOUNTER — Encounter: Payer: Self-pay | Admitting: Internal Medicine

## 2016-03-23 ENCOUNTER — Ambulatory Visit (INDEPENDENT_AMBULATORY_CARE_PROVIDER_SITE_OTHER): Payer: Managed Care, Other (non HMO) | Admitting: Internal Medicine

## 2016-03-23 DIAGNOSIS — B372 Candidiasis of skin and nail: Secondary | ICD-10-CM

## 2016-03-23 DIAGNOSIS — G2581 Restless legs syndrome: Secondary | ICD-10-CM

## 2016-03-23 DIAGNOSIS — I10 Essential (primary) hypertension: Secondary | ICD-10-CM | POA: Diagnosis not present

## 2016-03-23 MED ORDER — PANTOPRAZOLE SODIUM 40 MG PO TBEC: 40.0000 mg | DELAYED_RELEASE_TABLET | Freq: Every day | ORAL | Status: DC

## 2016-03-23 MED ORDER — NYSTATIN 100000 UNIT/GM EX CREA: 1.0000 "application " | TOPICAL_CREAM | Freq: Two times a day (BID) | CUTANEOUS | Status: DC

## 2016-03-23 MED ORDER — ROPINIROLE HCL 1 MG PO TABS: 3.0000 mg | ORAL_TABLET | Freq: Every day | ORAL | Status: DC

## 2016-03-23 MED ORDER — LIRAGLUTIDE -WEIGHT MANAGEMENT 18 MG/3ML ~~LOC~~ SOPN: 3.0000 mg | PEN_INJECTOR | Freq: Every day | SUBCUTANEOUS | Status: DC

## 2016-03-23 NOTE — Assessment & Plan Note (Signed)
Symptoms not completely controlled. Will increase Requip to 2-3mg  daily at bedtime. Follow up 3 months and prn.

## 2016-03-23 NOTE — Progress Notes (Signed)
Pre visit review using our clinic review tool, if applicable. No additional management support is needed unless otherwise documented below in the visit note. 

## 2016-03-23 NOTE — Progress Notes (Signed)
Subjective:    Patient ID: Paul Cook, male    DOB: 11-20-1950, 65 y.o.   MRN: [id number]  HPI  65YO male presents for follow up.  Obesity - Continues on Saxenda, now at 3mg  daily. Tolerating well. No NV. No reflux symptoms. Max weight here 284lb.  No recurrent diverticulitis since 10/2014. Scheduled for colonoscopy this month.  Insomnia - Improved some with Requip, however sometimes, having to take 2mg  or 2.5mg  to improve symptoms. At times, has frequent waking with restless legs. No daytime somnolence.  Wt Readings from Last 3 Encounters:  03/23/16 269 lb 12.8 oz (122.38 kg)  02/29/16 269 lb (122.018 kg)  12/18/15 276 lb (125.193 kg)   BP Readings from Last 3 Encounters:  03/23/16 112/72  12/18/15 104/63  11/16/15 124/78    Past Medical History  Diagnosis Date  . Asthma   . Depression   . GERD (gastroesophageal reflux disease)   . Hypertension   . Diverticulosis   . History of chicken pox   . Adenomatous colon polyp     tubular  . Adenomatous rectal polyp     tubular  . Lung nodule     CT 10/2014  . Sleep apnea     wears cpap  . Glaucoma     beginnings- no treatment yet    Family History  Problem Relation Age of Onset  . Depression Mother   . Hypertension Father   . Heart disease Father   . Stroke Father   . Cancer Maternal Grandmother     ovarian?  . Cancer Paternal Grandfather     prostate  . Prostate cancer Paternal Grandfather   . Colon cancer Neg Hx   . Colon polyps Neg Hx    Past Surgical History  Procedure Laterality Date  . Hernia repair      inguinal  . Colonoscopy w/ polypectomy  2015    [facility], benign  . Colonoscopy    . Polypectomy    . Tonsillectomy      age 64    Social History   Social History  . Marital Status: Married    Spouse Name: N/A  . Number of Children: N/A  . Years of Education: N/A   Social History Main Topics  . Smoking status: Never Smoker   . Smokeless tobacco: Never Used  . Alcohol Use: No  . Drug Use: No   . Sexual Activity: Not Asked   Other Topics Concern  . None   Social History Narrative   Lives in [place] with wife, [name]. 1 daughter in [place].      Work - Optician, dispensing, now Animal nutritionist                Review of Systems  Constitutional: Negative for fever, chills, activity change, appetite change, fatigue and unexpected weight change.  Eyes: Negative for visual disturbance.  Respiratory: Negative for cough and shortness of breath.   Cardiovascular: Negative for chest pain, palpitations and leg swelling.  Gastrointestinal: Negative for nausea, vomiting, abdominal pain, diarrhea, constipation and abdominal distention.  Genitourinary: Negative for dysuria, urgency and difficulty urinating.  Musculoskeletal: Negative for arthralgias and gait problem.  Skin: Negative for color change and rash.  Hematological: Negative for adenopathy.  Psychiatric/Behavioral: Positive for sleep disturbance. Negative for dysphoric mood. The patient is not nervous/anxious.        Objective:    BP 112/72 mmHg  Pulse 77  Ht 5\' 5"  (1.651 m)  Wt 269 lb 12.8 oz (  122.38 kg)  BMI 44.90 kg/m2  SpO2 95% Physical Exam  Constitutional: He is oriented to person, place, and time. He appears well-developed and well-nourished. No distress.  HENT:  Head: Normocephalic and atraumatic.  Right Ear: External ear normal.  Left Ear: External ear normal.  Nose: Nose normal.  Mouth/Throat: Oropharynx is clear and moist. No oropharyngeal exudate.  Eyes: Conjunctivae and EOM are normal. Pupils are equal, round, and reactive to light. Right eye exhibits no discharge. Left eye exhibits no discharge. No scleral icterus.  Neck: Normal range of motion. Neck supple. No tracheal deviation present. No thyromegaly present.  Cardiovascular: Normal rate, regular rhythm and normal heart sounds.  Exam reveals no gallop and no friction rub.   No murmur heard. Pulmonary/Chest: Effort normal and breath sounds  normal. No accessory muscle usage. No tachypnea. No respiratory distress. He has no decreased breath sounds. He has no wheezes. He has no rhonchi. He has no rales. He exhibits no tenderness.  Musculoskeletal: Normal range of motion. He exhibits no edema.  Lymphadenopathy:    He has no cervical adenopathy.  Neurological: He is alert and oriented to person, place, and time. No cranial nerve deficit. Coordination normal.  Skin: Skin is warm and dry. No rash noted. He is not diaphoretic. No erythema. No pallor.  Psychiatric: He has a normal mood and affect. His behavior is normal. Judgment and thought content normal.          Assessment & Plan:   Problem List Items Addressed This Visit      Unprioritized   Candidal dermatitis   Relevant Medications   nystatin cream (MYCOSTATIN)   Essential hypertension, benign    BP Readings from Last 3 Encounters:  03/23/16 112/72  12/18/15 104/63  11/16/15 124/78   BP well controlled. Continue current medications.      Restless legs    Symptoms not completely controlled. Will increase Requip to 2-3mg  daily at bedtime. Follow up 3 months and prn.      Severe obesity (BMI >= 40) (HCC) - Primary    Wt Readings from Last 3 Encounters:  03/23/16 269 lb 12.8 oz (122.38 kg)  02/29/16 269 lb (122.018 kg)  12/18/15 276 lb (125.193 kg)   Congratulated pt on weight loss. Encouraged continued healthy diet and physical activity as tolerated. Continue Saxenda.      Relevant Medications   Liraglutide -Weight Management (SAXENDA) 18 MG/3ML SOPN       Return in about 3 months (around 06/23/2016) for Recheck.  Ronette Deter, MD Internal Medicine [facility]

## 2016-03-23 NOTE — Assessment & Plan Note (Signed)
BP Readings from Last 3 Encounters:  03/23/16 112/72  12/18/15 104/63  11/16/15 124/78   BP well controlled. Continue current medications.

## 2016-03-23 NOTE — Assessment & Plan Note (Signed)
Wt Readings from Last 3 Encounters:  03/23/16 269 lb 12.8 oz (122.38 kg)  02/29/16 269 lb (122.018 kg)  12/18/15 276 lb (125.193 kg)   Congratulated pt on weight loss. Encouraged continued healthy diet and physical activity as tolerated. Continue Saxenda.

## 2016-03-23 NOTE — Patient Instructions (Addendum)
Increase Requip to 2mg  to 3mg  daily at bedtime for restless legs.  Continue Saxenda.  Follow up in 3 months.

## 2016-03-28 ENCOUNTER — Other Ambulatory Visit: Payer: Self-pay | Admitting: Internal Medicine

## 2016-04-05 ENCOUNTER — Telehealth: Payer: Self-pay

## 2016-04-05 NOTE — Telephone Encounter (Signed)
PA completed on Cover my meds for Saxenda, approved from 03/06/16 through 04/05/17. thanks

## 2016-04-06 ENCOUNTER — Ambulatory Visit (INDEPENDENT_AMBULATORY_CARE_PROVIDER_SITE_OTHER): Payer: Managed Care, Other (non HMO) | Admitting: Family Medicine

## 2016-04-06 ENCOUNTER — Encounter: Payer: Self-pay | Admitting: Family Medicine

## 2016-04-06 VITALS — BP 132/76 | HR 83 | Temp 98.3°F | Ht 65.0 in | Wt 270.8 lb

## 2016-04-06 DIAGNOSIS — L0213 Carbuncle of neck: Secondary | ICD-10-CM | POA: Diagnosis not present

## 2016-04-06 DIAGNOSIS — R238 Other skin changes: Secondary | ICD-10-CM

## 2016-04-06 DIAGNOSIS — L989 Disorder of the skin and subcutaneous tissue, unspecified: Secondary | ICD-10-CM | POA: Diagnosis not present

## 2016-04-06 DIAGNOSIS — L0212 Furuncle of neck: Secondary | ICD-10-CM | POA: Insufficient documentation

## 2016-04-06 MED ORDER — TRIAMCINOLONE ACETONIDE 0.1 % EX CREA: 1.0000 "application " | TOPICAL_CREAM | Freq: Two times a day (BID) | CUTANEOUS | Status: DC

## 2016-04-06 NOTE — Patient Instructions (Signed)
Nice to meet you. Your boil opened up with cleaning it. It drained some fluid. You should continue to use warm compresses on this area to allow to continue to drain. Please monitor and if you develop redness or increasing pain or recurrent pus please seek medical attention. We will treat you with a steroid cream for your buttock area. If this does not improve please let us know. If you develop nausea, vomiting, fevers, redness, pain, or any new or changing symptoms please seek medical attention.

## 2016-04-06 NOTE — Progress Notes (Signed)
Patient ID: Paul Cook, male   DOB: 1951-06-26, 65 y.o.   MRN: [id number]  Tommi Rumps, MD Phone: [phone]  Paul Cook is a 65 y.o. male who presents today for same day visit.  Patient reports recurrent issues with a boil on the back of his neck just inside the hairline. Notes has been there intermittently for the last 2 months. Can drain with warm compresses though does note his wife is stuck with a needle several weeks ago to drain it. No fevers or chills. It just does not completely get better and keeps coming back.  Patient also notes some skin sensitivity along the inferior aspect of his right butt cheek. Unable to see if there is a rash. He put an antibiotic cream on and got some relief. Notes it is a superficial irritation. Has been there 3 months. It will go away for 2 weeks and then come back.  PMH: nonsmoker.   ROS see history of present illness  Objective  Physical Exam Filed Vitals:   04/06/16 1516  BP: 132/76  Pulse: 83  Temp: 98.3 F (36.8 C)    BP Readings from Last 3 Encounters:  04/06/16 132/76  03/23/16 112/72  12/18/15 104/63   Wt Readings from Last 3 Encounters:  04/06/16 270 lb 12.8 oz (122.834 kg)  03/23/16 269 lb 12.8 oz (122.38 kg)  02/29/16 269 lb (122.018 kg)    Physical Exam  HENT:  Head:    Skin:        Assessment/Plan: Please see individual problem list.   Boil of neck Patient with boil or infected hair follicle on posterior neck at base of scalp. I discussed incision and drainage with the patient and while cleaning with alcohol swab prior to incision and drainage the area opened up and drained. No procedure was performed. Purulent material was expressed. Advised on warm compresses to facilitate drainage. He'll continue to monitor. Given that drainage did occur no need for antibiotics at this time. We'll monitor and if does not improve he will let us know. He is given return precautions.  Skin sensitivity No obvious  abnormalities and area of sensitivity on right inferior buttock. No signs of infection. Discussed trying topical steroid to see if that would be beneficial. He'll continue to monitor. Given return precautions.    No orders of the defined types were placed in this encounter.    Meds ordered this encounter  Medications  . triamcinolone cream (KENALOG) 0.1 %    Sig: Apply 1 application topically 2 (two) times daily.    Dispense:  30 g    Refill:  0    Tommi Rumps, MD [facility]

## 2016-04-06 NOTE — Progress Notes (Signed)
Pre visit review using our clinic review tool, if applicable. No additional management support is needed unless otherwise documented below in the visit note. 

## 2016-04-06 NOTE — Assessment & Plan Note (Signed)
Patient with boil or infected hair follicle on posterior neck at base of scalp. I discussed incision and drainage with the patient and while cleaning with alcohol swab prior to incision and drainage the area opened up and drained. No procedure was performed. Purulent material was expressed. Advised on warm compresses to facilitate drainage. He'll continue to monitor. Given that drainage did occur no need for antibiotics at this time. We'll monitor and if does not improve he will let us know. He is given return precautions.

## 2016-04-06 NOTE — Assessment & Plan Note (Signed)
No obvious abnormalities and area of sensitivity on right inferior buttock. No signs of infection. Discussed trying topical steroid to see if that would be beneficial. He'll continue to monitor. Given return precautions.

## 2016-04-11 ENCOUNTER — Ambulatory Visit (AMBULATORY_SURGERY_CENTER): Payer: Managed Care, Other (non HMO) | Admitting: Gastroenterology

## 2016-04-11 ENCOUNTER — Encounter: Payer: Self-pay | Admitting: Gastroenterology

## 2016-04-11 VITALS — BP 118/67 | HR 79 | Temp 98.7°F | Resp 17 | Ht 65.0 in | Wt 269.0 lb

## 2016-04-11 DIAGNOSIS — D120 Benign neoplasm of cecum: Secondary | ICD-10-CM | POA: Diagnosis not present

## 2016-04-11 DIAGNOSIS — D122 Benign neoplasm of ascending colon: Secondary | ICD-10-CM | POA: Diagnosis not present

## 2016-04-11 DIAGNOSIS — D123 Benign neoplasm of transverse colon: Secondary | ICD-10-CM | POA: Diagnosis not present

## 2016-04-11 DIAGNOSIS — Z86010 Personal history of colonic polyps: Secondary | ICD-10-CM | POA: Diagnosis present

## 2016-04-11 MED ORDER — SODIUM CHLORIDE 0.9 % IV SOLN: 500.0000 mL | INTRAVENOUS | Status: DC

## 2016-04-11 NOTE — Progress Notes (Signed)
Called to room to assist during endoscopic procedure.  Patient ID and intended procedure confirmed with present staff. Received instructions for my participation in the procedure from the performing physician.  

## 2016-04-11 NOTE — Patient Instructions (Signed)
YOU HAD AN ENDOSCOPIC PROCEDURE TODAY AT [facility]:   Refer to the procedure report that was given to you for any specific questions about what was found during the examination.  If the procedure report does not answer your questions, please call your gastroenterologist to clarify.  If you requested that your care partner not be given the details of your procedure findings, then the procedure report has been included in a sealed envelope for you to review at your convenience later.  YOU SHOULD EXPECT: Some feelings of bloating in the abdomen. Passage of more gas than usual.  Walking can help get rid of the air that was put into your GI tract during the procedure and reduce the bloating. If you had a lower endoscopy (such as a colonoscopy or flexible sigmoidoscopy) you may notice spotting of blood in your stool or on the toilet paper. If you underwent a bowel prep for your procedure, you may not have a normal bowel movement for a few days.  Please Note:  You might notice some irritation and congestion in your nose or some drainage.  This is from the oxygen used during your procedure.  There is no need for concern and it should clear up in a day or so.  SYMPTOMS TO REPORT IMMEDIATELY:   Following lower endoscopy (colonoscopy or flexible sigmoidoscopy):  Excessive amounts of blood in the stool  Significant tenderness or worsening of abdominal pains  Swelling of the abdomen that is new, acute  Fever of 100F or higher   For urgent or emergent issues, a gastroenterologist can be reached at any hour by calling [phone].   DIET: Your first meal following the procedure should be a small meal and then it is ok to progress to your normal diet. Heavy or fried foods are harder to digest and may make you feel nauseous or bloated.  Likewise, meals heavy in dairy and vegetables can increase bloating.  Drink plenty of fluids but you should avoid alcoholic beverages for 24  hours.  ACTIVITY:  You should plan to take it easy for the rest of today and you should NOT DRIVE or use heavy machinery until tomorrow (because of the sedation medicines used during the test).    FOLLOW UP: Our staff will call the number listed on your records the next business day following your procedure to check on you and address any questions or concerns that you may have regarding the information given to you following your procedure. If we do not reach you, we will leave a message.  However, if you are feeling well and you are not experiencing any problems, there is no need to return our call.  We will assume that you have returned to your regular daily activities without incident.  If any biopsies were taken you will be contacted by phone or by letter within the next 1-3 weeks.  Please call us at [phone] if you have not heard about the biopsies in 3 weeks.    SIGNATURES/CONFIDENTIALITY: You and/or your care partner have signed paperwork which will be entered into your electronic medical record.  These signatures attest to the fact that that the information above on your After Visit Summary has been reviewed and is understood.  Full responsibility of the confidentiality of this discharge information lies with you and/or your care-partner.  Polyps, diverticulosis, hemorrhoids handouts given. No ASA or NSAIDS for 2 weeks. Repeat colonoscopy per results of pathology.

## 2016-04-11 NOTE — Op Note (Signed)
[place] Patient Name: Paul Cook Procedure Date: 04/11/2016 3:07 PM MRN: [id number] Endoscopist: Remo Lipps P. Havery Moros , MD Age: 65 Referring MD:  Date of Birth: [birth year] Gender: Male Account #: [id number] Procedure:                Colonoscopy Indications:              history of diverticulitis of the left colon,                            intermittent LLQ pain, history of high risk colon                            polyps (5cm TVA removed 2 years ago) Medicines:                Monitored Anesthesia Care Procedure:                Pre-Anesthesia Assessment:                           - Prior to the procedure, a History and Physical                            was performed, and patient medications and                            allergies were reviewed. The patient's tolerance of                            previous anesthesia was also reviewed. The risks                            and benefits of the procedure and the sedation                            options and risks were discussed with the patient.                            All questions were answered, and informed consent                            was obtained. Prior Anticoagulants: The patient has                            taken no previous anticoagulant or antiplatelet                            agents. ASA Grade Assessment: III - A patient with                            severe systemic disease. After reviewing the risks                            and benefits, the patient was deemed in  satisfactory condition to undergo the procedure.                           After obtaining informed consent, the colonoscope                            was passed under direct vision. Throughout the                            procedure, the patient's blood pressure, pulse, and                            oxygen saturations were monitored continuously. The                            Model CF-HQ190L  [id number]) scope was introduced                            through the anus and advanced to the the cecum,                            identified by appendiceal orifice and ileocecal                            valve. The colonoscopy was performed without                            difficulty. The patient tolerated the procedure                            well. The quality of the bowel preparation was                            adequate. The ileocecal valve, appendiceal orifice,                            and rectum were photographed. Scope In: 3:19:09 PM Scope Out: 3:43:43 PM Scope Withdrawal Time: 0 hours 22 minutes 16 seconds  Total Procedure Duration: 0 hours 24 minutes 34 seconds  Findings:                 The perianal and digital rectal examinations were                            normal.                           Many small and large-mouthed diverticula were found                            in the entire colon, most severe in the left colon                            associated with luminal narrowing and mild  superficial erythema without ulceration or erosion.                           A 5 mm polyp was found in the cecum. The polyp was                            flat. The polyp was removed with a cold snare.                            Resection and retrieval were complete.                           A 4 mm polyp was found in the ascending colon. The                            polyp was sessile. The polyp was removed with a                            cold biopsy forceps. Resection and retrieval were                            complete.                           A diminutive polyp was found in the transverse                            colon. The polyp was sessile. The polyp was removed                            with a cold biopsy forceps. Resection and retrieval                            were complete.                           A 5 mm polyp was found in  the transverse colon. The                            polyp was sessile. The polyp was removed with a                            cold snare. Resection and retrieval were complete.                           A tattoo was seen in the sigmoid colon. The tattoo                            site appeared normal.                           Non-bleeding internal hemorrhoids were found during  retroflexion.                           The exam was otherwise without abnormality. Complications:            No immediate complications. Estimated blood loss:                            Minimal. Estimated Blood Loss:     Estimated blood loss was minimal. Impression:               - Diverticulosis in the entire examined colon,                            worst in the left colon.                           - One 5 mm polyp in the cecum, removed with a cold                            snare. Resected and retrieved.                           - One 4 mm polyp in the ascending colon, removed                            with a cold biopsy forceps. Resected and retrieved.                           - One diminutive polyp in the transverse colon,                            removed with a cold biopsy forceps. Resected and                            retrieved.                           - One 5 mm polyp in the transverse colon, removed                            with a cold snare. Resected and retrieved.                           - A tattoo was seen in the sigmoid colon. The                            tattoo site appeared normal.                           - Non-bleeding internal hemorrhoids.                           - The examination was otherwise normal.  Overall, diverticulosis could be causing the                            patient's LLQ pain, symptomatic uncomplicated                            diverticular disease (SUDD) with visceral                             hypersensitivity. Recommendation:           - Patient has a contact number available for                            emergencies. The signs and symptoms of potential                            delayed complications were discussed with the                            patient. Return to normal activities tomorrow.                            Written discharge instructions were provided to the                            patient.                           - Resume previous diet.                           - Continue present medications.                           - No aspirin, ibuprofen, naproxen, or other                            non-steroidal anti-inflammatory drugs for 2 weeks                            after polyp removal.                           - Await pathology results.                           - Repeat colonoscopy is recommended for                            surveillance. The colonoscopy date will be                            determined after pathology results from today's                            exam become available for review. Remo Lipps P. Havery Moros, MD  04/11/2016 3:52:31 PM This report has been signed electronically.

## 2016-04-11 NOTE — Progress Notes (Signed)
Report given to PACU RN, vss 

## 2016-04-12 ENCOUNTER — Telehealth: Payer: Self-pay | Admitting: Gastroenterology

## 2016-04-12 ENCOUNTER — Telehealth: Payer: Self-pay

## 2016-04-12 NOTE — Telephone Encounter (Signed)
  Follow up Call-  Call back number 04/11/2016  Post procedure Call Back phone  # [phone]  Permission to leave phone message Yes     Patient was called for follow up after his procedure on 04/11/2016. No answer at the number given for follow up phone call. A message was left on the answering machine.

## 2016-04-12 NOTE — Telephone Encounter (Signed)
Phone call to patient. States that he ate an apple yesterday. Complaining of some bloating and discomfort today, rated pain a 6 when he first awoke but states now it is about a 3. Reviewed dietary suggestions for today and encouraged warm fluids, walking around and trying to pass any additional gas. Told him if not continuing to improve or pain worsens to call us back.

## 2016-04-15 ENCOUNTER — Encounter: Payer: Self-pay | Admitting: Gastroenterology

## 2016-04-15 ENCOUNTER — Telehealth: Payer: Self-pay | Admitting: Internal Medicine

## 2016-04-15 MED ORDER — ZOLPIDEM TARTRATE 10 MG PO TABS: 10.0000 mg | ORAL_TABLET | Freq: Every evening | ORAL | Status: DC | PRN

## 2016-04-15 NOTE — Telephone Encounter (Signed)
Fine to fill. 

## 2016-04-15 NOTE — Telephone Encounter (Signed)
Pt called back and said 30 instead of 90. Thank you!

## 2016-04-15 NOTE — Telephone Encounter (Signed)
Pt called stating that the medication of zolpidem (AMBIEN) 10 MG tablet needs authorization and the dept that they ask for is Patient level authorization then the insurance company will sent to pharmacy. The number is [id number].   Pt would like a Rx to go to [facility] 90 day supply.  Call pt @ [phone]. Thank you!

## 2016-04-18 ENCOUNTER — Telehealth: Payer: Self-pay | Admitting: Gastroenterology

## 2016-04-18 NOTE — Telephone Encounter (Signed)
Patient given results as per letter written on 04/15/16.

## 2016-04-20 ENCOUNTER — Telehealth: Payer: Self-pay | Admitting: *Deleted

## 2016-04-20 NOTE — Telephone Encounter (Signed)
Spoke with the pharmacy, he will need a PA for 30tablets for 30 days, insurance is only covering 15 tablets for 30 days. thanks

## 2016-04-20 NOTE — Telephone Encounter (Signed)
Patient will need a prior authorization for Zolpidem,30 day supply . Pt requested to go back to [facility] to complications with the mail order. Pharmacy [facility]

## 2016-04-21 NOTE — Telephone Encounter (Signed)
PA completed on Cover my meds awaiting response. thanks

## 2016-04-22 NOTE — Telephone Encounter (Signed)
Spoke with patient  Approved from 03/23/2016-04/22/2017.

## 2016-04-22 NOTE — Telephone Encounter (Signed)
Patient requested a call, when the PA is completed.  Pt contact [phone]

## 2016-04-26 NOTE — Telephone Encounter (Signed)
refaxed to pharmacy. thanks

## 2016-04-26 NOTE — Telephone Encounter (Signed)
Patient stated that [facility] did not receive the Rx for this medication.  Pt contact   [phone]

## 2016-05-03 ENCOUNTER — Other Ambulatory Visit: Payer: Self-pay | Admitting: Internal Medicine

## 2016-05-03 NOTE — Telephone Encounter (Signed)
Refill request for xanax, last seen [id number], last filled [id number].  Please advise.

## 2016-05-03 NOTE — Telephone Encounter (Signed)
Does he want Lorazepam or Alprazolam?

## 2016-05-03 NOTE — Telephone Encounter (Signed)
Ativan

## 2016-05-16 ENCOUNTER — Other Ambulatory Visit: Payer: Self-pay | Admitting: Internal Medicine

## 2016-05-20 ENCOUNTER — Other Ambulatory Visit: Payer: Self-pay | Admitting: Internal Medicine

## 2016-05-31 ENCOUNTER — Telehealth: Payer: Self-pay | Admitting: Gastroenterology

## 2016-05-31 NOTE — Telephone Encounter (Signed)
Left message for patient to call back  

## 2016-06-01 NOTE — Telephone Encounter (Signed)
Patient reports that he has an episode of belching, excess, gas, then diarrhea for several hours.  This happens about 1 time every 2-3 weeks, usually after a fatty meal.  He asks what he can do to prevent these symptoms.  He is advised that most likely it is triggered by his diet and he should try a food diary to better learn his triggers.  I recommended he try IBgard when he has these symptoms as well as his dicyclomine.  He will call back for any additional questions or concerns.

## 2016-06-06 ENCOUNTER — Other Ambulatory Visit: Payer: Self-pay

## 2016-06-06 MED ORDER — LIRAGLUTIDE -WEIGHT MANAGEMENT 18 MG/3ML ~~LOC~~ SOPN: 3.0000 mg | PEN_INJECTOR | Freq: Every day | SUBCUTANEOUS | 3 refills | Status: DC

## 2016-06-06 NOTE — Telephone Encounter (Signed)
Medication refill

## 2016-06-13 ENCOUNTER — Telehealth: Payer: Self-pay | Admitting: *Deleted

## 2016-06-13 MED ORDER — LIRAGLUTIDE -WEIGHT MANAGEMENT 18 MG/3ML ~~LOC~~ SOPN
3.0000 mg | PEN_INJECTOR | Freq: Every day | SUBCUTANEOUS | 3 refills | Status: DC
Start: 2016-06-13 — End: 2016-10-25

## 2016-06-13 NOTE — Telephone Encounter (Signed)
[facility] requested a corrected Rx for Saxenda with corrected dosage and quantity

## 2016-06-13 NOTE — Telephone Encounter (Signed)
[facility]oke with [facility], sent in new rx for 90 day supply versus one pen that was ordered. thanks

## 2016-06-21 ENCOUNTER — Ambulatory Visit (INDEPENDENT_AMBULATORY_CARE_PROVIDER_SITE_OTHER): Payer: Managed Care, Other (non HMO) | Admitting: Family Medicine

## 2016-06-21 ENCOUNTER — Encounter: Payer: Self-pay | Admitting: Family Medicine

## 2016-06-21 DIAGNOSIS — Z6841 Body Mass Index (BMI) 40.0 and over, adult: Secondary | ICD-10-CM

## 2016-06-21 NOTE — Patient Instructions (Signed)
Consider weight loss surgery.  Continue diet and try and exercise regularly (try the pool).  Cut back on the portions (and eat more frequently; fruits, veggies, lean meats).  Follow up in 3-6 months.  Take care  Dr. Lacinda Axon

## 2016-06-21 NOTE — Progress Notes (Signed)
Pre visit review using our clinic review tool, if applicable. No additional management support is needed unless otherwise documented below in the visit note. 

## 2016-06-21 NOTE — Assessment & Plan Note (Signed)
Established problem, worsening. Discussed treatment options including bariatric surgery. Strongly advised him to consider bariatric surgery. Handout from [facility].

## 2016-06-21 NOTE — Progress Notes (Signed)
Subjective:  Patient ID: Paul Cook, male    DOB: [birth year]  Age: 65 y.o. MRN: [id number]  CC: Obesity, discuss weight loss   HPI:  66 year old male with morbid obesity, HTN, OSA presents to discuss obesity/weight loss.  Morbid obesity  He states that he lost about 14-15 lbs on Saxenda.  He has recently been gaining weight.  He has attempted to change his diet. He continues to struggle with overeating.  Exercise is limited due to arthralgias.   In addition to lifestyle changes and Saxenda he has tried wellbutrin without success.  He would like to discuss treatment options today.  Social Hx   Social History   Social History  . Marital status: Married    Spouse name: N/A  . Number of children: N/A  . Years of education: N/A   Social History Main Topics  . Smoking status: Never Smoker  . Smokeless tobacco: Never Used  . Alcohol use No  . Drug use: No  . Sexual activity: Not Asked   Other Topics Concern  . None   Social History Narrative   Lives in [place] with wife, [name]. 1 daughter in [place].      Work - Optician, dispensing, now Animal nutritionist               Review of Systems  Constitutional:       [employer] gain.  Gastrointestinal:       Epigastric pain.   Objective:  BP (!) 141/83 (BP Location: Left Arm, Patient Position: Sitting, Cuff Size: Large)   Pulse 86   Temp 98.6 F (37 C) (Oral)   Wt 273 lb (123.8 kg)   SpO2 96%   BMI 45.43 kg/m   BP/Weight 06/21/2016 04/11/2016 0000000  Systolic BP Q000111Q [id number] Q000111Q  Diastolic BP 83 67 76  Wt. (Lbs) 273 269 270.8  BMI 45.43 44.76 45.06    Physical Exam  Constitutional: He is oriented to person, place, and time. He appears well-developed. No distress.  Cardiovascular: Normal rate and regular rhythm.   Pulmonary/Chest: Effort normal. He has no wheezes. He has no rales.  Abdominal: Soft. He exhibits no distension. There is no tenderness.  Neurological: He is alert and oriented to person, place, and  time.  Vitals reviewed.   Lab Results  Component Value Date   WBC 13.1 (H) 09/30/2015   HGB 14.4 09/30/2015   HCT 43.0 09/30/2015   PLT 201.0 09/30/2015   GLUCOSE 109 (H) 09/30/2015   CHOL 191 04/15/2014   TRIG 113.0 04/15/2014   HDL 50.60 04/15/2014   LDLCALC 118 (H) 04/15/2014   ALT 24 09/30/2015   AST 24 09/30/2015   NA 138 09/30/2015   K 4.1 09/30/2015   CL 100 09/30/2015   CREATININE 1.04 09/30/2015   BUN 17 09/30/2015   CO2 32 09/30/2015   TSH 1.60 04/15/2014   HGBA1C 5.7 04/15/2014   MICROALBUR 0.7 04/15/2014    Assessment & Plan:   Problem List Items Addressed This Visit    Morbid obesity with BMI of 45.0-49.9, adult ([place])    Established problem, worsening. Discussed treatment options including bariatric surgery. Strongly advised him to consider bariatric surgery. Handout from [facility].       Other Visit Diagnoses   None.    Follow-up: 3-6 months.   30 minutes were spent face-to-face with the patient during this encounter and all of that time was spent on counseling regarding treatment options for morbid obesity, predominantly bariatric  surgery (indication, types, etc).  [place]

## 2016-06-24 ENCOUNTER — Other Ambulatory Visit: Payer: Self-pay | Admitting: Family Medicine

## 2016-06-24 MED ORDER — LISINOPRIL-HYDROCHLOROTHIAZIDE 20-25 MG PO TABS: 1.0000 | ORAL_TABLET | Freq: Every day | ORAL | 3 refills | Status: DC

## 2016-06-24 NOTE — Telephone Encounter (Signed)
Pre visit review using our clinic review tool, if applicable. No additional management support is needed unless otherwise documented below in the visit note. 

## 2016-06-30 ENCOUNTER — Other Ambulatory Visit: Payer: Self-pay | Admitting: Family Medicine

## 2016-06-30 NOTE — Telephone Encounter (Signed)
Pt called requesting a refill on zolpidem (AMBIEN) 10 MG tablet for 90 day supply instead of 30 days.  Pharmacy [id number] - [address], [place]  Call pt  @ [phone]  Thank you! AM

## 2016-06-30 NOTE — Telephone Encounter (Signed)
Please advise for 90 day refill, thanks

## 2016-07-01 ENCOUNTER — Ambulatory Visit: Payer: Managed Care, Other (non HMO) | Admitting: Internal Medicine

## 2016-07-01 MED ORDER — ZOLPIDEM TARTRATE 10 MG PO TABS: 10.0000 mg | ORAL_TABLET | Freq: Every evening | ORAL | 1 refills | Status: DC | PRN

## 2016-07-01 NOTE — Telephone Encounter (Signed)
faxed

## 2016-07-06 ENCOUNTER — Telehealth: Payer: Self-pay | Admitting: Family Medicine

## 2016-07-06 NOTE — Telephone Encounter (Signed)
Pt called about the pharmacy needing more clarification for zolpidem (AMBIEN) 10 MG tablet 90 day supply. [phone]. Medication was stopped for now. Please advise?  Pharmacy is [facility], [place]  Thank you!

## 2016-07-06 NOTE — Telephone Encounter (Signed)
Patient has been informed about PA and Dosage. patient understood and had questions, comments, or concerns.

## 2016-07-16 ENCOUNTER — Emergency Department
Admission: EM | Admit: 2016-07-16 | Discharge: 2016-07-16 | Disposition: A | Discharge status: Home / Self Care | Payer: Managed Care, Other (non HMO) | Attending: Emergency Medicine | Admitting: Emergency Medicine

## 2016-07-16 ENCOUNTER — Emergency Department: Payer: Managed Care, Other (non HMO)

## 2016-07-16 ENCOUNTER — Encounter: Payer: Self-pay | Admitting: Emergency Medicine

## 2016-07-16 DIAGNOSIS — R55 Syncope and collapse: Secondary | ICD-10-CM | POA: Diagnosis present

## 2016-07-16 DIAGNOSIS — Z79899 Other long term (current) drug therapy: Secondary | ICD-10-CM | POA: Insufficient documentation

## 2016-07-16 DIAGNOSIS — I10 Essential (primary) hypertension: Secondary | ICD-10-CM | POA: Diagnosis not present

## 2016-07-16 DIAGNOSIS — J45909 Unspecified asthma, uncomplicated: Secondary | ICD-10-CM | POA: Diagnosis not present

## 2016-07-16 DIAGNOSIS — E109 Type 1 diabetes mellitus without complications: Secondary | ICD-10-CM | POA: Diagnosis not present

## 2016-07-16 DIAGNOSIS — R200 Anesthesia of skin: Secondary | ICD-10-CM | POA: Insufficient documentation

## 2016-07-16 LAB — URINALYSIS COMPLETE WITH MICROSCOPIC (ARMC ONLY)
Bacteria, UA: NONE SEEN
Bilirubin Urine: NEGATIVE
Glucose, UA: NEGATIVE mg/dL
Ketones, ur: NEGATIVE mg/dL
Leukocytes, UA: NEGATIVE
Nitrite: NEGATIVE
Protein, ur: NEGATIVE mg/dL
Specific Gravity, Urine: 1.02 (ref 1.005–1.030)
pH: 6 (ref 5.0–8.0)

## 2016-07-16 LAB — BASIC METABOLIC PANEL
Anion gap: 8 (ref 5–15)
BUN: 19 mg/dL (ref 6–20)
CO2: 27 mmol/L (ref 22–32)
Calcium: 9.1 mg/dL (ref 8.9–10.3)
Chloride: 102 mmol/L (ref 101–111)
Creatinine, Ser: 0.97 mg/dL (ref 0.61–1.24)
GFR calc Af Amer: >60 mL/min (ref >60)
GFR calc non Af Amer: >60 mL/min (ref >60)
Glucose, Bld: 88 mg/dL (ref 65–99)
Potassium: 3.4 mmol/L — ABNORMAL LOW (ref 3.5–5.1)
Sodium: 137 mmol/L (ref 135–145)

## 2016-07-16 LAB — CBC
HCT: 42.1 % (ref 40.0–52.0)
Hemoglobin: 14.8 g/dL (ref 13.0–18.0)
MCH: 30.1 pg (ref 26.0–34.0)
MCHC: 35.1 g/dL (ref 32.0–36.0)
MCV: 85.8 fL (ref 80.0–100.0)
Platelets: 174 10*3/uL (ref 150–440)
RBC: 4.91 MIL/uL (ref 4.40–5.90)
RDW: 13.6 % (ref 11.5–14.5)
WBC: 13.6 10*3/uL — ABNORMAL HIGH (ref 3.8–10.6)

## 2016-07-16 LAB — GLUCOSE, CAPILLARY: Glucose-Capillary: 83 mg/dL (ref 65–99)

## 2016-07-16 LAB — TROPONIN I: Troponin I: <0.03 ng/mL (ref <0.03)

## 2016-07-16 LAB — FIBRIN DERIVATIVES D-DIMER (ARMC ONLY): Fibrin derivatives D-dimer (ARMC): 266 (ref 0–499)

## 2016-07-16 NOTE — ED Triage Notes (Addendum)
Pt started with dizziness 30-25min ago and then had numbness to left face and arm that lasted 1 minute. Sx resolved. When pt moves head reports things "still look weird".

## 2016-07-16 NOTE — ED Triage Notes (Signed)
Pt has had a mild general weakness. Spoke with dr Corky Downs about pt. No code stroke to be called at this time but will CT head.

## 2016-07-16 NOTE — ED Provider Notes (Signed)
Time Seen: Approximately 1802  I have reviewed the triage notes  Chief Complaint: Numbness and Dizziness   History of Present Illness: Paul Cook is a 65 y.o. male who presents with some feelings of generalized weakness and near syncope. Patient states he was driving today and has sudden onset of feeling lightheadedness and had some numbness and tingling over the left side of his head and into his left arm. He denies any weakness and seemed to be able to continue to drive. He was driving at a high rate of speed and gradually moved toward the exit and was able to get out and ambulate. No trouble with speech or swallowing. He states the whole episode seemed to last approximately 2-3 minutes. He denies any symptoms at present. He denies any vertiginous symptoms. No trouble with speech or swallowing. He denies any visual field deficits. He states he had some mild blurred vision initially with the episode. But denies any ringing in his ears or feeling off balance or any other focal neurologic symptoms. He denies any chest pain or shortness of breath.   Past Medical History:  Diagnosis Date  . Adenomatous colon polyp    tubular  . Adenomatous rectal polyp    tubular  . Asthma   . Depression   . Diverticulosis   . GERD (gastroesophageal reflux disease)   . Glaucoma    beginnings- no treatment yet   . History of chicken pox   . Hypertension   . Lung nodule    CT 10/2014  . Sleep apnea    wears cpap    Patient Active Problem List   Diagnosis Date Noted  . GERD (gastroesophageal reflux disease) 12/18/2015  . Restless legs 03/19/2015  . Abnormal CT scan, chest 11/21/2014  . Essential hypertension, benign 01/14/2014  . Obstructive sleep apnea 12/17/2013  . IBS (irritable bowel syndrome) 07/30/2013  . Insomnia 06/12/2013  . Cervical disc disorder with radiculopathy of cervical region 06/12/2013  . Morbid obesity with BMI of 45.0-49.9, adult ([place]) 06/12/2013  . Seasonal allergies  06/12/2013  . Anxiety state, unspecified 06/12/2013    Past Surgical History:  Procedure Laterality Date  . COLONOSCOPY    . COLONOSCOPY W/ POLYPECTOMY  2015   [facility], benign  . HERNIA REPAIR     inguinal  . POLYPECTOMY    . TONSILLECTOMY     age 14     Past Surgical History:  Procedure Laterality Date  . COLONOSCOPY    . COLONOSCOPY W/ POLYPECTOMY  2015   [facility], benign  . HERNIA REPAIR     inguinal  . POLYPECTOMY    . TONSILLECTOMY     age 72     Current Outpatient Rx  . Order #: [id number] Class: Normal  . Order #: [id number] Class: Normal  . Order #: [id number] Class: Normal  . Order #: [id number] Class: Normal  . Order #: [id number] Class: Normal  . Order #: [id number] Class: Normal  . Order #: [id number] Class: Normal  . Order #: [id number] Class: Normal  . Order #: [id number] Class: Print  . Order #: [id number] Class: Normal  . Order #: [id number] Class: Historical Med  . Order #: [id number] Class: Normal  . Order #: [id number] Class: Print  . Order #: [id number] Class: Normal  . Order #: [id number] Class: Normal  . Order #: [id number] Class: Historical Med  . Order #: [id number] Class: Normal    Allergies:  Nsaids  Family History: Family History  Problem Relation Age of Onset  . Depression Mother   . Hypertension Father   . Heart  disease Father   . Stroke Father   . Cancer Maternal Grandmother     ovarian?  . Cancer Paternal Grandfather     prostate  . Prostate cancer Paternal Grandfather   . Colon cancer Neg Hx   . Colon polyps Neg Hx     Social History: Social History  Substance Use Topics  . Smoking status: Never Smoker  . Smokeless tobacco: Never Used  . Alcohol use No     Review of Systems:   10 point review of systems was performed and was otherwise negative:  Constitutional: No fever Eyes: No visual disturbances ENT: No sore throat, ear pain Cardiac: No chest pain Respiratory: No shortness of breath, wheezing, or stridor Abdomen: No abdominal pain, no  vomiting, No diarrhea Endocrine: No weight loss, No night sweats Extremities: No peripheral edema, cyanosis Skin: No rashes, easy bruising Neurologic: No focal weakness, trouble with speech or swollowing Urologic: No dysuria, Hematuria, or urinary frequency   Physical Exam:  ED Triage Vitals  Enc Vitals Group     BP 07/16/16 1651 (!) 143/82     Pulse Rate 07/16/16 1651 89     Resp 07/16/16 1651 18     Temp 07/16/16 1651 98.1 F (36.7 C)     Temp Source 07/16/16 1651 Oral     SpO2 07/16/16 1651 97 %     Weight 07/16/16 1652 268 lb (121.6 kg)     Height 07/16/16 1652 5\' 6"  (1.676 m)     Head Circumference --      Peak Flow --      Pain Score --      Pain Loc --      Pain Edu? --      Excl. in [place]? --     General: Awake , Alert , and Oriented times 3; GCS 15 Head: Normal cephalic , atraumatic Eyes: Pupils equal , round, reactive to light Nose/Throat: No nasal drainage, patent upper airway without erythema or exudate.  Neck: Supple, Full range of motion, No anterior adenopathy or palpable thyroid masses Lungs: Clear to ascultation without wheezes , rhonchi, or rales Heart: Regular rate, regular rhythm without murmurs , gallops , or rubs Abdomen: Soft, non tender without rebound, guarding , or rigidity; bowel sounds positive and symmetric in all 4 quadrants. No organomegaly .        Extremities: 2 plus symmetric pulses. No edema, clubbing or cyanosis Neurologic: normal ambulation, Motor symmetric without deficits, sensory intact Skin: warm, dry, no rashes   Labs:   All laboratory work was reviewed including any pertinent negatives or positives listed below:  Labs Reviewed  BASIC METABOLIC PANEL - Abnormal; Notable for the following:       Result Value   Potassium 3.4 (*)    All other components within normal limits  CBC - Abnormal; Notable for the following:    WBC 13.6 (*)    All other components within normal limits  URINALYSIS COMPLETEWITH MICROSCOPIC ([facility] ONLY) -  Abnormal; Notable for the following:    Color, Urine YELLOW (*)    APPearance CLEAR (*)    Hgb urine dipstick 1+ (*)    Squamous Epithelial / LPF 0-5 (*)    All other components within normal limits  TROPONIN I  GLUCOSE, CAPILLARY  FIBRIN DERIVATIVES D-DIMER ([facility] ONLY)  CBG MONITORING, ED  Laboratory work was reviewed and showed no clinically significant abnormalities.   EKG: * ED ECG REPORT I, Daymon Larsen, the attending physician, personally  viewed and interpreted this ECG.  Date: 07/16/2016 EKG Time: 1659 Rate: 85Rhythm: normal sinus rhythm QRS Axis: normal Intervals: normal ST/T Wave abnormalities: normal Conduction Disturbances: none Narrative Interpretation: unremarkable Normal EKG   Radiology:  "Dg Chest 2 View  Result Date: 07/16/2016 CLINICAL DATA:  C/o left side of head tingling today; denies any shob cp or cough; EXAM: CHEST  2 VIEW COMPARISON:  None. FINDINGS: Normal mediastinum and cardiac silhouette. Normal pulmonary vasculature. No evidence of effusion, infiltrate, or pneumothorax. No acute bony abnormality. IMPRESSION: Normal mediastinum and cardiac silhouette. Normal pulmonary vasculature. No evidence of effusion, infiltrate, or pneumothorax. No acute bony abnormality. Electronically Signed   By: Suzy Bouchard M.D.   On: 07/16/2016 19:09   Ct Head Wo Contrast  Result Date: 07/16/2016 CLINICAL DATA:  Acute onset of dizziness and left scalp tingling for 1 minutes approx. 1 hour ago EXAM: CT HEAD WITHOUT CONTRAST TECHNIQUE: Contiguous axial images were obtained from the base of the skull through the vertex without intravenous contrast. COMPARISON:  None. FINDINGS: Brain: No acute intracranial hemorrhage. No focal mass lesion. No CT evidence of acute infarction. No midline shift or mass effect. No hydrocephalus. Basilar cisterns are patent. Mild periventricular white matter hypodensities Vascular:  No hyperdense vessel or unexpected calcification. Skull: Normal.  Negative for fracture or focal lesion. Sinuses/Orbits: Paranasal sinuses and mastoid air cells are clear. Orbits are clear. Other: None IMPRESSION: No acute intracranial findings Mild white matter microvascular disease . Electronically Signed   By: Suzy Bouchard M.D.   On: 07/16/2016 17:42  "  I personally reviewed the radiologic studies     ED Course:  Patient's stay here was uneventful he had no other further episodes. I'm not sure what created what sounds like a near syncopal episode with some numbness. It does not seem to be a transient ischemic attack or cerebrovascular accident nor does it seem to be cardiogenic near syncope. As it may been concerned he didn't have enough to eat with his diabetes I explained that may be a possibility in and also that his workup is not complete. Recommend taking an aspirin a day and follow up with his primary physician on Monday which may entail more further cardiovascular workup neurologic assessment. I felt we didn't have a strong reason to admit him to the hospital at this point and advised him to have a low threshold to return to the hospital if he had any other further symptoms. Clinical Course     Assessment: * Near syncope  Final Clinical Impression:   Final diagnoses:  Numbness  Near syncope     Plan:  Outpatient Patient was advised to return immediately if condition worsens. Patient was advised to follow up with their primary care physician or other specialized physicians involved in their outpatient care. The patient and/or family member/power of attorney had laboratory results reviewed at the bedside. All questions and concerns were addressed and appropriate discharge instructions were distributed by the nursing staff.            Daymon Larsen, MD 07/16/16 2007

## 2016-07-18 ENCOUNTER — Ambulatory Visit (INDEPENDENT_AMBULATORY_CARE_PROVIDER_SITE_OTHER): Payer: Managed Care, Other (non HMO) | Admitting: Family Medicine

## 2016-07-18 ENCOUNTER — Encounter: Payer: Self-pay | Admitting: Family Medicine

## 2016-07-18 VITALS — BP 142/82 | HR 91 | Temp 97.4°F | Wt 272.2 lb

## 2016-07-18 DIAGNOSIS — R42 Dizziness and giddiness: Secondary | ICD-10-CM

## 2016-07-18 DIAGNOSIS — Z23 Encounter for immunization: Secondary | ICD-10-CM

## 2016-07-18 DIAGNOSIS — R200 Anesthesia of skin: Secondary | ICD-10-CM | POA: Insufficient documentation

## 2016-07-18 NOTE — Assessment & Plan Note (Signed)
New problem. Unclear diagnosis. ? Vertigo.  Well-appearing today with a normal exam. Discussed watchful waiting versus neurology referral. Patient elected for watchful waiting.

## 2016-07-18 NOTE — Assessment & Plan Note (Signed)
New problem. Uncertain diagnosis/prognosis. Discussed watchful waiting versus referral. Patient elected for watchful waiting.

## 2016-07-18 NOTE — Patient Instructions (Addendum)
Call with concerns/issues.  Consider the medications as we discussed.  Take care  Dr. Lacinda Axon

## 2016-07-18 NOTE — Progress Notes (Signed)
Pre visit review using our clinic review tool, if applicable. No additional management support is needed unless otherwise documented below in the visit note. 

## 2016-07-18 NOTE — Progress Notes (Signed)
Subjective:  Patient ID: Paul Cook, male    DOB: 1950/11/18  Age: 65 y.o. MRN: [id number]  CC: ED follow up  HPI:  65 year old male with hypertension, morbid obesity, OSA, cervical radiculopathy presents for ED follow-up.  On Saturday, patient developed dizziness/lightheadedness while driving. He states that he felt like he was going to pass out. He states that things were "vibrating". He states that his dizziness lasted for seconds and then resolved. He subsequently developed left-sided head numbness and left arm numbness. He states that he slowly pulled over and his symptoms subsequently resolved. No reports of any other focal neurological deficits. No trouble with speech. No visual defects. No known inciting factor. He did note that he had not eaten as he normally does. Given his symptomatology, patient went to the emergency department for evaluation. Workup including CT was negative. He was advised to follow-up.  Patient is today for follow-up. In addition to the above, patient states that on Sunday he developed pain/pressure in the left eye which subsequent resolved. He's had no further episodes of dizziness. He denies any true symptoms of vertigo although it is difficult to discern based on his history. He's had no other issues since this time. He is very concerned and would like to discuss this today  Social Hx   Social History   Social History  . Marital status: Married    Spouse name: N/A  . Number of children: N/A  . Years of education: N/A   Social History Main Topics  . Smoking status: Never Smoker  . Smokeless tobacco: Never Used  . Alcohol use No  . Drug use: No  . Sexual activity: Not Asked   Other Topics Concern  . None   Social History Narrative   Lives in [place] with wife, [name]. 1 daughter in [place].      Work - Optician, dispensing, now Animal nutritionist               Review of Systems  Constitutional: Negative.   Eyes: Negative for visual  disturbance.  Neurological: Positive for dizziness, light-headedness and numbness.   Objective:  BP (!) 142/82 (BP Location: Right Arm, Patient Position: Sitting, Cuff Size: Large)   Pulse 91   Temp 97.4 F (36.3 C) (Oral)   Wt 272 lb 4 oz (123.5 kg)   SpO2 96%   BMI 43.94 kg/m   BP/Weight 07/18/2016 [id number]  Systolic BP A999333 Q000111Q Q000111Q  Diastolic BP 82 90 83  Wt. (Lbs) 272.25 268 273  BMI 43.94 43.26 45.43   Physical Exam  Constitutional: He is oriented to person, place, and time. He appears well-developed. No distress.  Cardiovascular: Normal rate and regular rhythm.   Pulmonary/Chest: Effort normal. He has no wheezes. He has no rales.  Neurological: He is alert and oriented to person, place, and time. No cranial nerve deficit.  No focal neurological deficits.  Psychiatric: He has a normal mood and affect.  Vitals reviewed.  Lab Results  Component Value Date   WBC 13.6 (H) 07/16/2016   HGB 14.8 07/16/2016   HCT 42.1 07/16/2016   PLT 174 07/16/2016   GLUCOSE 88 07/16/2016   CHOL 191 04/15/2014   TRIG 113.0 04/15/2014   HDL 50.60 04/15/2014   LDLCALC 118 (H) 04/15/2014   ALT 24 09/30/2015   AST 24 09/30/2015   NA 137 07/16/2016   K 3.4 (L) 07/16/2016   CL 102 07/16/2016   CREATININE 0.97 07/16/2016   BUN 19 07/16/2016  CO2 27 07/16/2016   TSH 1.60 04/15/2014   HGBA1C 5.7 04/15/2014   MICROALBUR 0.7 04/15/2014   Assessment & Plan:   Problem List Items Addressed This Visit    Dizziness - Primary    New problem. Unclear diagnosis. ? Vertigo.  Well-appearing today with a normal exam. Discussed watchful waiting versus neurology referral. Patient elected for watchful waiting.      Left sided numbness    New problem. Uncertain diagnosis/prognosis. Discussed watchful waiting versus referral. Patient elected for watchful waiting.       Other Visit Diagnoses    Encounter for immunization       Relevant Orders   Flu Vaccine QUAD 36+ mos IM  (Completed)     Follow-up: PRN  [place]

## 2016-07-28 ENCOUNTER — Other Ambulatory Visit: Payer: Self-pay | Admitting: *Deleted

## 2016-07-28 NOTE — Telephone Encounter (Signed)
Patient also requested a Rx refill for lorazepam  Pharmacy [facility]

## 2016-07-28 NOTE — Telephone Encounter (Signed)
Patient has requested medication refill for trazodone and insulin pen [facility]

## 2016-07-29 MED ORDER — INSULIN PEN NEEDLE 31G X 5 MM MISC
5 refills | Status: DC
Start: 2016-07-29 — End: 2016-10-25

## 2016-07-29 MED ORDER — LORAZEPAM 0.5 MG PO TABS: 0.5000 mg | ORAL_TABLET | Freq: Every day | ORAL | 3 refills | Status: DC

## 2016-07-29 MED ORDER — TRAZODONE HCL 300 MG PO TABS
300.0000 mg | ORAL_TABLET | Freq: Every day | ORAL | 3 refills | Status: DC
Start: 2016-07-29 — End: 2017-06-08

## 2016-07-29 NOTE — Telephone Encounter (Signed)
Lorazepam- refilled 05/03/16  Trazodone -refilled 12/16/15  Pt last seen 07/18/16. Pt has a future appt on 08/29/16. Please advise?

## 2016-07-29 NOTE — Telephone Encounter (Signed)
Faxed

## 2016-08-29 ENCOUNTER — Ambulatory Visit (INDEPENDENT_AMBULATORY_CARE_PROVIDER_SITE_OTHER): Payer: PPO | Admitting: Family Medicine

## 2016-08-29 ENCOUNTER — Encounter: Payer: Self-pay | Admitting: Family Medicine

## 2016-08-29 DIAGNOSIS — M722 Plantar fascial fibromatosis: Secondary | ICD-10-CM | POA: Diagnosis not present

## 2016-08-29 MED ORDER — MELOXICAM 15 MG PO TABS: 15.0000 mg | ORAL_TABLET | Freq: Every day | ORAL | 0 refills | Status: DC

## 2016-08-29 MED ORDER — LORAZEPAM 0.5 MG PO TABS: 0.5000 mg | ORAL_TABLET | Freq: Every day | ORAL | 3 refills | Status: DC

## 2016-08-29 MED ORDER — PANTOPRAZOLE SODIUM 40 MG PO TBEC: 40.0000 mg | DELAYED_RELEASE_TABLET | Freq: Every day | ORAL | 3 refills | Status: DC

## 2016-08-29 MED ORDER — RANITIDINE HCL 150 MG PO TABS: 150.0000 mg | ORAL_TABLET | Freq: Two times a day (BID) | ORAL | 3 refills | Status: DC

## 2016-08-29 NOTE — Progress Notes (Signed)
Subjective:  Patient ID: Paul Cook, male    DOB: 02-10-1951  Age: 65 y.o. MRN: [id number]  CC: Foot pain  HPI:  65 year old morbidly obese male presents with complaints of foot pain.  Foot pain  Right foot pain.  X 1 month.  Pain is located at the heel.  Moderate in severity.   Worse with activity.  He has been using inserts with some improvement. No other interventions tried.  No other associated symptoms.   No other complaints at this time.  Social Hx   Social History   Social History  . Marital status: Married    Spouse name: N/A  . Number of children: N/A  . Years of education: N/A   Social History Main Topics  . Smoking status: Never Smoker  . Smokeless tobacco: Never Used  . Alcohol use No  . Drug use: No  . Sexual activity: Not Asked   Other Topics Concern  . None   Social History Narrative   Lives in [place] with wife, [name]. 1 daughter in [place].      Work - Optician, dispensing, now Animal nutritionist                Review of Systems  Gastrointestinal:       GERD.   Musculoskeletal:       Right foot pain.     Objective:  BP 132/72 (BP Location: Right Arm, Patient Position: Sitting, Cuff Size: Large)   Pulse 97   Temp 98.2 F (36.8 C) (Oral)   Resp 16   Wt 276 lb 4 oz (125.3 kg)   SpO2 96%   BMI 44.59 kg/m   BP/Weight 08/29/2016 07/18/2016 [id number]  Systolic BP Q000111Q A999333 Q000111Q  Diastolic BP 72 82 90  Wt. (Lbs) 276.25 272.25 268  BMI 44.59 43.94 43.26    Physical Exam  Constitutional: He is oriented to person, place, and time. He appears well-developed. No distress.  Pulmonary/Chest: Effort normal.  Musculoskeletal:  Right foot - tenderness to palpation at the attachment site of the plantar fascia.  Neurological: He is alert and oriented to person, place, and time.  Psychiatric: He has a normal mood and affect.  Vitals reviewed.  Lab Results  Component Value Date   WBC 13.6 (H) 07/16/2016   HGB 14.8 07/16/2016   HCT 42.1 07/16/2016   PLT 174 07/16/2016   GLUCOSE 88 07/16/2016   CHOL 191 04/15/2014   TRIG 113.0 04/15/2014   HDL 50.60 04/15/2014   LDLCALC 118 (H) 04/15/2014   ALT 24 09/30/2015   AST 24 09/30/2015   NA 137 07/16/2016   K 3.4 (L) 07/16/2016   CL 102 07/16/2016   CREATININE 0.97 07/16/2016   BUN 19 07/16/2016   CO2 27 07/16/2016   TSH 1.60 04/15/2014   HGBA1C 5.7 04/15/2014   MICROALBUR 0.7 04/15/2014    Assessment & Plan:   Problem List Items Addressed This Visit    Plantar fasciitis    New problem. Handout given with exercises.  Advised continued use of inserts. PRN Mobic.          Meds ordered this encounter  Medications  . DISCONTD: meloxicam (MOBIC) 15 MG tablet    Sig: Take 1 tablet (15 mg total) by mouth daily.    Dispense:  30 tablet    Refill:  0  . ranitidine (ZANTAC) 150 MG tablet    Sig: Take 1 tablet (150 mg total) by mouth 2 (two) times daily.  Dispense:  180 tablet    Refill:  3  . pantoprazole (PROTONIX) 40 MG tablet    Sig: Take 1 tablet (40 mg total) by mouth daily.    Dispense:  90 tablet    Refill:  3  . LORazepam (ATIVAN) 0.5 MG tablet    Sig: Take 1-2 tablets (0.5-1 mg total) by mouth at bedtime.    Dispense:  60 tablet    Refill:  3    This request is for a new prescription for a controlled substance as required by Federal/State law..  . meloxicam (MOBIC) 15 MG tablet    Sig: Take 1 tablet (15 mg total) by mouth daily.    Dispense:  30 tablet    Refill:  0    Follow-up: PRN  [place]

## 2016-08-29 NOTE — Progress Notes (Signed)
Pre visit review using our clinic review tool, if applicable. No additional management support is needed unless otherwise documented below in the visit note. 

## 2016-08-29 NOTE — Assessment & Plan Note (Signed)
New problem. Handout given with exercises.  Advised continued use of inserts. PRN Mobic.

## 2016-08-29 NOTE — Patient Instructions (Signed)
Take the medication daily as needed.   Plantar Fasciitis With Rehab The plantar fascia is a fibrous, ligament-like, soft-tissue structure that spans the bottom of the foot. Plantar fasciitis, also called heel spur syndrome, is a condition that causes pain in the foot due to inflammation of the tissue. SYMPTOMS   Pain and tenderness on the underneath side of the foot.  Pain that worsens with standing or walking. CAUSES  Plantar fasciitis is caused by irritation and injury to the plantar fascia on the underneath side of the foot. Common mechanisms of injury include:  Direct trauma to bottom of the foot.  Damage to a small nerve that runs under the foot where the main fascia attaches to the heel bone.  Stress placed on the plantar fascia due to bone spurs. RISK INCREASES WITH:   Activities that place stress on the plantar fascia (running, jumping, pivoting, or cutting).  Poor strength and flexibility.  Improperly fitted shoes.  Tight calf muscles.  Flat feet.  Failure to warm-up properly before activity.  Obesity. PREVENTION  Warm up and stretch properly before activity.  Allow for adequate recovery between workouts.  Maintain physical fitness:  Strength, flexibility, and endurance.  Cardiovascular fitness.  Maintain a health body weight.  Avoid stress on the plantar fascia.  Wear properly fitted shoes, including arch supports for individuals who have flat feet. PROGNOSIS  If treated properly, then the symptoms of plantar fasciitis usually resolve without surgery. However, occasionally surgery is necessary. RELATED COMPLICATIONS   Recurrent symptoms that may result in a chronic condition.  Problems of the lower back that are caused by compensating for the injury, such as limping.  Pain or weakness of the foot during push-off following surgery.  Chronic inflammation, scarring, and partial or complete fascia tear, occurring more often from repeated  injections. TREATMENT  Treatment initially involves the use of ice and medication to help reduce pain and inflammation. The use of strengthening and stretching exercises may help reduce pain with activity, especially stretches of the Achilles tendon. These exercises may be performed at home or with a therapist. Your caregiver may recommend that you use heel cups of arch supports to help reduce stress on the plantar fascia. Occasionally, corticosteroid injections are given to reduce inflammation. If symptoms persist for greater than 6 months despite non-surgical (conservative), then surgery may be recommended.  MEDICATION   If pain medication is necessary, then nonsteroidal anti-inflammatory medications, such as aspirin and ibuprofen, or other minor pain relievers, such as acetaminophen, are often recommended.  Do not take pain medication within 7 days before surgery.  Prescription pain relievers may be given if deemed necessary by your caregiver. Use only as directed and only as much as you need.  Corticosteroid injections may be given by your caregiver. These injections should be reserved for the most serious cases, because they may only be given a certain number of times. HEAT AND COLD  Cold treatment (icing) relieves pain and reduces inflammation. Cold treatment should be applied for 10 to 15 minutes every 2 to 3 hours for inflammation and pain and immediately after any activity that aggravates your symptoms. Use ice packs or massage the area with a piece of ice (ice massage).  Heat treatment may be used prior to performing the stretching and strengthening activities prescribed by your caregiver, physical therapist, or athletic trainer. Use a heat pack or soak the injury in warm water. SEEK IMMEDIATE MEDICAL CARE IF:  Treatment seems to offer no benefit, or the condition  worsens.  Any medications produce adverse side effects. EXERCISES RANGE OF MOTION (ROM) AND STRETCHING EXERCISES -  Plantar Fasciitis (Heel Spur Syndrome) These exercises may help you when beginning to rehabilitate your injury. Your symptoms may resolve with or without further involvement from your physician, physical therapist or athletic trainer. While completing these exercises, remember:   Restoring tissue flexibility helps normal motion to return to the joints. This allows healthier, less painful movement and activity.  An effective stretch should be held for at least 30 seconds.  A stretch should never be painful. You should only feel a gentle lengthening or release in the stretched tissue. RANGE OF MOTION - Toe Extension, Flexion  Sit with your right / left leg crossed over your opposite knee.  Grasp your toes and gently pull them back toward the top of your foot. You should feel a stretch on the bottom of your toes and/or foot.  Hold this stretch for __________ seconds.  Now, gently pull your toes toward the bottom of your foot. You should feel a stretch on the top of your toes and or foot.  Hold this stretch for __________ seconds. Repeat __________ times. Complete this stretch __________ times per day.  RANGE OF MOTION - Ankle Dorsiflexion, Active Assisted  Remove shoes and sit on a chair that is preferably not on a carpeted surface.  Place right / left foot under knee. Extend your opposite leg for support.  Keeping your heel down, slide your right / left foot back toward the chair until you feel a stretch at your ankle or calf. If you do not feel a stretch, slide your bottom forward to the edge of the chair, while still keeping your heel down.  Hold this stretch for __________ seconds. Repeat __________ times. Complete this stretch __________ times per day.  STRETCH - Gastroc, Standing  Place hands on wall.  Extend right / left leg, keeping the front knee somewhat bent.  Slightly point your toes inward on your back foot.  Keeping your right / left heel on the floor and your knee  straight, shift your weight toward the wall, not allowing your back to arch.  You should feel a gentle stretch in the right / left calf. Hold this position for __________ seconds. Repeat __________ times. Complete this stretch __________ times per day. STRETCH - Soleus, Standing  Place hands on wall.  Extend right / left leg, keeping the other knee somewhat bent.  Slightly point your toes inward on your back foot.  Keep your right / left heel on the floor, bend your back knee, and slightly shift your weight over the back leg so that you feel a gentle stretch deep in your back calf.  Hold this position for __________ seconds. Repeat __________ times. Complete this stretch __________ times per day. STRETCH - Gastrocsoleus, Standing  Note: This exercise can place a lot of stress on your foot and ankle. Please complete this exercise only if specifically instructed by your caregiver.   Place the ball of your right / left foot on a step, keeping your other foot firmly on the same step.  Hold on to the wall or a rail for balance.  Slowly lift your other foot, allowing your body weight to press your heel down over the edge of the step.  You should feel a stretch in your right / left calf.  Hold this position for __________ seconds.  Repeat this exercise with a slight bend in your right / left knee.  Repeat __________ times. Complete this stretch __________ times per day.  STRENGTHENING EXERCISES - Plantar Fasciitis (Heel Spur Syndrome)  These exercises may help you when beginning to rehabilitate your injury. They may resolve your symptoms with or without further involvement from your physician, physical therapist or athletic trainer. While completing these exercises, remember:   Muscles can gain both the endurance and the strength needed for everyday activities through controlled exercises.  Complete these exercises as instructed by your physician, physical therapist or athletic trainer.  Progress the resistance and repetitions only as guided. STRENGTH - Towel Curls  Sit in a chair positioned on a non-carpeted surface.  Place your foot on a towel, keeping your heel on the floor.  Pull the towel toward your heel by only curling your toes. Keep your heel on the floor.  If instructed by your physician, physical therapist or athletic trainer, add ____________________ at the end of the towel. Repeat __________ times. Complete this exercise __________ times per day. STRENGTH - Ankle Inversion  Secure one end of a rubber exercise band/tubing to a fixed object (table, pole). Loop the other end around your foot just before your toes.  Place your fists between your knees. This will focus your strengthening at your ankle.  Slowly, pull your big toe up and in, making sure the band/tubing is positioned to resist the entire motion.  Hold this position for __________ seconds.  Have your muscles resist the band/tubing as it slowly pulls your foot back to the starting position. Repeat __________ times. Complete this exercises __________ times per day.    This information is not intended to replace advice given to you by your health care provider. Make sure you discuss any questions you have with your health care provider.   Document Released: 10/03/2005 Document Revised: 02/17/2015 Document Reviewed: 01/15/2009 Elsevier Interactive Patient Education [employer].

## 2016-09-06 ENCOUNTER — Ambulatory Visit: Payer: Managed Care, Other (non HMO) | Admitting: Family Medicine

## 2016-10-03 ENCOUNTER — Other Ambulatory Visit (HOSPITAL_COMMUNITY): Payer: Self-pay | Admitting: Surgery

## 2016-10-12 ENCOUNTER — Other Ambulatory Visit: Payer: Self-pay | Admitting: Family Medicine

## 2016-10-12 MED ORDER — DICYCLOMINE HCL 10 MG PO CAPS: 10.0000 mg | ORAL_CAPSULE | Freq: Two times a day (BID) | ORAL | 1 refills | Status: DC

## 2016-10-14 ENCOUNTER — Ambulatory Visit: Payer: PPO

## 2016-10-18 ENCOUNTER — Other Ambulatory Visit: Payer: PPO

## 2016-10-18 ENCOUNTER — Ambulatory Visit: Payer: PPO

## 2016-10-24 ENCOUNTER — Encounter: Payer: Self-pay | Admitting: Family Medicine

## 2016-10-24 ENCOUNTER — Ambulatory Visit (INDEPENDENT_AMBULATORY_CARE_PROVIDER_SITE_OTHER): Payer: PPO | Admitting: Family Medicine

## 2016-10-24 DIAGNOSIS — K219 Gastro-esophageal reflux disease without esophagitis: Secondary | ICD-10-CM

## 2016-10-24 DIAGNOSIS — R197 Diarrhea, unspecified: Secondary | ICD-10-CM | POA: Diagnosis not present

## 2016-10-24 NOTE — Patient Instructions (Signed)
Stop the saxenda.  See the nutritionist.  Follow up 6 months.  Take care  Dr. Lacinda Axon

## 2016-10-25 DIAGNOSIS — R197 Diarrhea, unspecified: Secondary | ICD-10-CM | POA: Insufficient documentation

## 2016-10-25 NOTE — Progress Notes (Signed)
   Subjective:  Patient ID: Paul Cook, male    DOB: Mar 10, 1951  Age: 66 y.o. MRN: [id number]  CC: Belching, diarrhea, reflex  HPI:  66 year old male with morbid obesity (currently on saxenda), IBS presents with the above complaints.  Patient reports he has had belching (smells of rotten eggs), reflux and intermittent diarrhea for the past 3 months. He reports some associated abdominal cramping. No dietary changes. He is currently taking saxenda for weight loss. No fever, chills. He has had improvement over the past week. No known exacerbating or relieving factors. No other complaints at this time.  Social Hx   Social History   Social History  . Marital status: Married    Spouse name: N/A  . Number of children: N/A  . Years of education: N/A   Social History Main Topics  . Smoking status: Never Smoker  . Smokeless tobacco: Never Used  . Alcohol use No  . Drug use: No  . Sexual activity: Not Asked   Other Topics Concern  . None   Social History Narrative   Lives in [place] with wife, [name]. 1 daughter in [place].      Work - Optician, dispensing, now Animal nutritionist                Review of Systems  Constitutional: Negative.   Gastrointestinal: Positive for abdominal pain and diarrhea.       GERD, belching.   Objective:  BP (!) 142/86   Pulse 94   Temp 98.4 F (36.9 C) (Oral)   Resp 16   Wt 275 lb 9.6 oz (125 kg)   SpO2 96%   BMI 44.48 kg/m   BP/Weight 10/24/2016 08/29/2016 99991111  Systolic BP A999333 [id number]  Diastolic BP 86 72 82  Wt. (Lbs) 275.6 276.25 272.25  BMI 44.48 44.59 43.94   Physical Exam  Constitutional: He is oriented to person, place, and time. He appears well-developed. No distress.  Cardiovascular: Normal rate and regular rhythm.   Pulmonary/Chest: Effort normal and breath sounds normal.  Abdominal: Soft. He exhibits no distension. There is no tenderness.  Neurological: He is alert and oriented to person, place, and time.    Psychiatric: He has a normal mood and affect.  Vitals reviewed.  Lab Results  Component Value Date   WBC 13.6 (H) 07/16/2016   HGB 14.8 07/16/2016   HCT 42.1 07/16/2016   PLT 174 07/16/2016   GLUCOSE 88 07/16/2016   CHOL 191 04/15/2014   TRIG 113.0 04/15/2014   HDL 50.60 04/15/2014   LDLCALC 118 (H) 04/15/2014   ALT 24 09/30/2015   AST 24 09/30/2015   NA 137 07/16/2016   K 3.4 (L) 07/16/2016   CL 102 07/16/2016   CREATININE 0.97 07/16/2016   BUN 19 07/16/2016   CO2 27 07/16/2016   TSH 1.60 04/15/2014   HGBA1C 5.7 04/15/2014   MICROALBUR 0.7 04/15/2014    Assessment & Plan:   Problem List Items Addressed This Visit    GERD (gastroesophageal reflux disease)    Worsening in setting of saxenda use. Stopping saxenda.      Diarrhea    Patient with IBS and worsening symptoms over the past few months. Worsening likely attributed to dietary issues and use of Saxenda. Stopping Saxenda.        Follow-up: 6 months  [place] DO [facility]

## 2016-10-25 NOTE — Assessment & Plan Note (Signed)
Patient with IBS and worsening symptoms over the past few months. Worsening likely attributed to dietary issues and use of Saxenda. Stopping Saxenda.

## 2016-10-25 NOTE — Assessment & Plan Note (Signed)
Worsening in setting of saxenda use. Stopping saxenda.

## 2016-11-01 ENCOUNTER — Encounter: Payer: PPO | Attending: Surgery | Admitting: Dietician

## 2016-11-01 DIAGNOSIS — Z79899 Other long term (current) drug therapy: Secondary | ICD-10-CM | POA: Insufficient documentation

## 2016-11-01 DIAGNOSIS — Z713 Dietary counseling and surveillance: Secondary | ICD-10-CM | POA: Diagnosis not present

## 2016-11-01 DIAGNOSIS — J45909 Unspecified asthma, uncomplicated: Secondary | ICD-10-CM | POA: Insufficient documentation

## 2016-11-01 DIAGNOSIS — G4733 Obstructive sleep apnea (adult) (pediatric): Secondary | ICD-10-CM | POA: Diagnosis not present

## 2016-11-01 DIAGNOSIS — F419 Anxiety disorder, unspecified: Secondary | ICD-10-CM | POA: Diagnosis not present

## 2016-11-01 DIAGNOSIS — Z6841 Body Mass Index (BMI) 40.0 and over, adult: Secondary | ICD-10-CM | POA: Insufficient documentation

## 2016-11-01 NOTE — Progress Notes (Signed)
  Pre-Op Assessment Visit:  Pre-Operative sleeve gastrectomy Surgery  Medical Nutrition Therapy:  Appt start time: 340   End time:  430.  Patient was seen on 11/01/2016 for Pre-Operative Nutrition Assessment. Assessment and letter of approval faxed to [facility] Surgery Bariatric Surgery Program coordinator on 11/07/2016.   Preferred Learning Style:   No preference indicated   Learning Readiness:   Ready  Handouts given during visit include:  Pre-Op Goals Bariatric Surgery Protein Shakes   During the appointment today the following Pre-Op Goals were reviewed with the patient: Maintain or lose weight as instructed by your surgeon Make healthy food choices Begin to limit portion sizes Limited concentrated sugars and fried foods Keep fat/sugar in the single digits per serving on   food labels Practice CHEWING your food  (aim for 30 chews per bite or until applesauce consistency) Practice not drinking 15 minutes before, during, and 30 minutes after each meal/snack Avoid all carbonated beverages  Avoid/limit caffeinated beverages  Avoid all sugar-sweetened beverages Consume 3 meals per day; eat every 3-5 hours Make a list of non-food related activities Aim for 64-100 ounces of FLUID daily  Aim for at least 60-80 grams of PROTEIN daily Look for a liquid protein source that contain ?15 g protein and ?5 g carbohydrate  (ex: shakes, drinks, shots)  Demonstrated degree of understanding via:  Teach Back  Teaching Method Utilized:  Visual Auditory Hands on  Barriers to learning/adherence to lifestyle change: none  Patient to [facility] the [facility] to enroll in Pre-Op and Post-Op Nutrition Education when surgery date is scheduled.

## 2016-11-01 NOTE — Patient Instructions (Addendum)
-  Start keeping some foods on hand at work   -tuna, cheese sticks, boiled eggs, P3 packs, [place] Balanced Breaks  -Practice taking smaller bites  -Think about reducing caffeine -Try using a protein shake as a coffee creamer   -Make sure Dr. Lacinda Axon documents your weight and your discussion regarding diet, weight loss, bariatric surgery, etc. at your appointment in February

## 2016-11-07 ENCOUNTER — Encounter: Payer: Self-pay | Admitting: Dietician

## 2016-11-22 ENCOUNTER — Ambulatory Visit: Payer: PPO | Admitting: Psychiatry

## 2016-11-29 ENCOUNTER — Ambulatory Visit (INDEPENDENT_AMBULATORY_CARE_PROVIDER_SITE_OTHER): Payer: PPO | Admitting: Family Medicine

## 2016-11-29 ENCOUNTER — Encounter: Payer: Self-pay | Admitting: Family Medicine

## 2016-11-29 DIAGNOSIS — Z6841 Body Mass Index (BMI) 40.0 and over, adult: Secondary | ICD-10-CM | POA: Diagnosis not present

## 2016-11-29 NOTE — Patient Instructions (Signed)
Eliminate the snacks if you can.  Try eggs in the am.  Go for an evening walk.  Follow up with dietary and bariatric surgery.  Take care  Dr. Lacinda Axon

## 2016-11-30 NOTE — Progress Notes (Signed)
Subjective:  Patient ID: Paul Cook, male    DOB: 1951/08/17  Age: 66 y.o. MRN: [id number]  CC: Follow up Obesity/weight loss  HPI:  66 year old male with morbid obesity, OSA, hypertension presents for follow-up.  Patient has recently started seeing a nutritionist/dietitian after having an evaluation regarding bariatric surgery. He is to undergo 6 months of visits regarding weight loss/diet/exercise.  Since his visit with the dietitian on 1/16 he has gained approximately 10.6 lbs. he has not made any dietary changes. He states that he's eating candy and sweets and snacks during the day. He does not exercise.   24-hr recall Breakfast - ham and cheese sandwich from a vending machine and one cup of coffee (with sweetener). Snack - Special K bar (160 calories) Lunch - spaghetti meatballs, Diet Coke. He's not sure of the amount.  Snack - patient cannot recall whether he had a special K bar whether he had a candy bar. Dinner - 2 Bratwurst sausages, 1 whole avocado, he is unsure of the carb that he ate. Typical day? Yes.    Social Hx   Social History   Social History  . Marital status: Married    Spouse name: N/A  . Number of children: N/A  . Years of education: N/A   Social History Main Topics  . Smoking status: Never Smoker  . Smokeless tobacco: Never Used  . Alcohol use No  . Drug use: No  . Sexual activity: Not Asked   Other Topics Concern  . None   Social History Narrative   Lives in [place] with wife, [name]. 1 daughter in [place].      Work - Optician, dispensing, now Animal nutritionist               Review of Systems  Constitutional: Positive for fatigue.       Weight gain.  Psychiatric/Behavioral: Negative.    Objective:  BP (!) 155/83 (BP Location: Left Arm, Patient Position: Sitting, Cuff Size: Large)   Pulse 78   Temp 98.1 F (36.7 C) (Oral)   Wt 284 lb 3.2 oz (128.9 kg)   SpO2 97%   BMI 45.87 kg/m   BP/Weight 11/29/2016 11/01/2016 [id number]    Systolic BP 99991111 - A999333  Diastolic BP 83 - 86  Wt. (Lbs) 284.2 273.6 275.6  BMI 45.87 44.16 44.48   Physical Exam  Constitutional: He is oriented to person, place, and time. He appears well-developed. No distress.  Morbidly obese.  HENT:  Head: Normocephalic and atraumatic.  Pulmonary/Chest: Effort normal.  Neurological: He is alert and oriented to person, place, and time.  Psychiatric: He has a normal mood and affect.  Vitals reviewed.   Lab Results  Component Value Date   WBC 13.6 (H) 07/16/2016   HGB 14.8 07/16/2016   HCT 42.1 07/16/2016   PLT 174 07/16/2016   GLUCOSE 88 07/16/2016   CHOL 191 04/15/2014   TRIG 113.0 04/15/2014   HDL 50.60 04/15/2014   LDLCALC 118 (H) 04/15/2014   ALT 24 09/30/2015   AST 24 09/30/2015   NA 137 07/16/2016   K 3.4 (L) 07/16/2016   CL 102 07/16/2016   CREATININE 0.97 07/16/2016   BUN 19 07/16/2016   CO2 27 07/16/2016   TSH 1.60 04/15/2014   HGBA1C 5.7 04/15/2014   MICROALBUR 0.7 04/15/2014    Assessment & Plan:   Problem List Items Addressed This Visit    Morbid obesity with BMI of 45.0-49.9, adult [facility])    Patient has gained  weight secondary to snacking and dietary indiscretion. I recommended that he have a better protein source in the morning for breakfast to help eliminate snacking before lunch. I advised him to try to eliminate the snack before dinner. Advised daily exercise. Recommended that he try to start with an evening walk. Patient needs to make some changes. I am concerned that he will not be compliant with his diet following bariatric surgery. He needs to show that he is able to at least maintain or preferably lose weight prior to bariatric surgery. Needs close follow-up with nutrition.         Follow-up: 6 months (or sooner if needed); Needs to follow up with nutrition and bariatric surgeon  15 minutes were spent face-to-face with the patient during this encounter and all of that time was spent on counseling  regarding dietary changes, exercise to promote weight loss.   [place]

## 2016-11-30 NOTE — Assessment & Plan Note (Signed)
Patient has gained weight secondary to snacking and dietary indiscretion. I recommended that he have a better protein source in the morning for breakfast to help eliminate snacking before lunch. I advised him to try to eliminate the snack before dinner. Advised daily exercise. Recommended that he try to start with an evening walk. Patient needs to make some changes. I am concerned that he will not be compliant with his diet following bariatric surgery. He needs to show that he is able to at least maintain or preferably lose weight prior to bariatric surgery. Needs close follow-up with nutrition.

## 2016-12-13 DIAGNOSIS — M542 Cervicalgia: Secondary | ICD-10-CM | POA: Diagnosis not present

## 2016-12-13 DIAGNOSIS — M9903 Segmental and somatic dysfunction of lumbar region: Secondary | ICD-10-CM | POA: Diagnosis not present

## 2016-12-13 DIAGNOSIS — M5136 Other intervertebral disc degeneration, lumbar region: Secondary | ICD-10-CM | POA: Diagnosis not present

## 2016-12-13 DIAGNOSIS — M9901 Segmental and somatic dysfunction of cervical region: Secondary | ICD-10-CM | POA: Diagnosis not present

## 2016-12-19 ENCOUNTER — Telehealth: Payer: Self-pay | Admitting: *Deleted

## 2016-12-19 NOTE — Telephone Encounter (Signed)
Information was sent to requested person.

## 2016-12-19 NOTE — Telephone Encounter (Signed)
Pt requested a call to discuss having his office visit report in reference to his bariatric surgery faxed over to his bariatric surgeon. Fax Webb Silversmith [phone] Phone [phone]  Pt contact [phone]

## 2016-12-21 DIAGNOSIS — M9901 Segmental and somatic dysfunction of cervical region: Secondary | ICD-10-CM | POA: Diagnosis not present

## 2016-12-21 DIAGNOSIS — M542 Cervicalgia: Secondary | ICD-10-CM | POA: Diagnosis not present

## 2016-12-21 DIAGNOSIS — M5136 Other intervertebral disc degeneration, lumbar region: Secondary | ICD-10-CM | POA: Diagnosis not present

## 2016-12-21 DIAGNOSIS — M9903 Segmental and somatic dysfunction of lumbar region: Secondary | ICD-10-CM | POA: Diagnosis not present

## 2016-12-27 ENCOUNTER — Ambulatory Visit: Payer: PPO | Admitting: Dietician

## 2016-12-28 ENCOUNTER — Encounter: Payer: PPO | Attending: Surgery | Admitting: Registered"

## 2016-12-28 DIAGNOSIS — Z713 Dietary counseling and surveillance: Secondary | ICD-10-CM | POA: Insufficient documentation

## 2016-12-28 DIAGNOSIS — J45909 Unspecified asthma, uncomplicated: Secondary | ICD-10-CM | POA: Diagnosis not present

## 2016-12-28 DIAGNOSIS — G4733 Obstructive sleep apnea (adult) (pediatric): Secondary | ICD-10-CM | POA: Diagnosis not present

## 2016-12-28 DIAGNOSIS — Z79899 Other long term (current) drug therapy: Secondary | ICD-10-CM | POA: Insufficient documentation

## 2016-12-28 DIAGNOSIS — F419 Anxiety disorder, unspecified: Secondary | ICD-10-CM | POA: Diagnosis not present

## 2016-12-28 DIAGNOSIS — Z6841 Body Mass Index (BMI) 40.0 and over, adult: Secondary | ICD-10-CM | POA: Insufficient documentation

## 2016-12-28 NOTE — Progress Notes (Signed)
Supervised Weight Loss Appt start time: 1535    End time:  1600  Assessment: 2nd SWL appointment (1st SWL at [facility], but patient reports his Feb visit with his PCP, Thersa Salt, DO, counts as his 1st SWL appointment.) Patient states his wife is very supportive and would like ideas of foods to cook which he can take to work. Provided some handouts.  Surgery type: Sleeve Gastrectomy [facility] weight at [facility]: 273.6 lb Weight today: 281.2  Weight change from start: 7.6 lb weight gain  MEDICATIONS: See List. Patient states in Jan he started saxenda, but he stopped because he was losing weight. After he gained weight back in Feb, decided to start medication again (a couple of weeks ago) and feels it is suppressing his appetite.  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 Doctor has asked him to get 20 min of structured activity, but he is not ready for that yet.  Behavior: working on eating from the vending machine less and instead bringing meals from home.   Nutritional Diagnosis:  [place]-3.3 Overweight/obesity related to past poor dietary habits and physical inactivity as evidenced by patient w/ planned sleeve gastrectomysurgery and following dietary guidelines for continued weight loss.  Intervention:Nutrition counseling for upcoming Bariatric Surgery  Goals:  Begin limiting fried and other high fat foods and foods high in sugar  Eat proteins first, then vegetables, then starch  Practice chewing foods to applesauce consistency  Aim to spend 20 minutes eating meals  Work on eating 3 meals a day (or every 3-5 hours) if you are not already doing so  Follow physical activity  recommendations stated  Follow diet recommendations listed below   Energy and Macronutrient Recomendations: 1800calories 200 g carbohydrates 135g protein 50g fat  Handouts given:  Mindful Eating brochure  Protein  Lean Protein sources  Bake Broil Grill  Teaching Method Utilized:  Visual Auditory  Barriers to learning/adherenceto lifestyle change:none stated  Demonstrated degree of understanding via: Teach Back  Monitoring/Evaluation: Dietary intake, exercise, and body weight in 81month(s)

## 2016-12-29 DIAGNOSIS — M5136 Other intervertebral disc degeneration, lumbar region: Secondary | ICD-10-CM | POA: Diagnosis not present

## 2016-12-29 DIAGNOSIS — M9903 Segmental and somatic dysfunction of lumbar region: Secondary | ICD-10-CM | POA: Diagnosis not present

## 2016-12-29 DIAGNOSIS — M542 Cervicalgia: Secondary | ICD-10-CM | POA: Diagnosis not present

## 2016-12-29 DIAGNOSIS — M9901 Segmental and somatic dysfunction of cervical region: Secondary | ICD-10-CM | POA: Diagnosis not present

## 2016-12-30 ENCOUNTER — Other Ambulatory Visit: Payer: Self-pay | Admitting: Family Medicine

## 2016-12-30 NOTE — Telephone Encounter (Signed)
error 

## 2017-01-03 ENCOUNTER — Telehealth: Payer: Self-pay | Admitting: Family Medicine

## 2017-01-03 ENCOUNTER — Other Ambulatory Visit: Payer: Self-pay | Admitting: Family Medicine

## 2017-01-03 DIAGNOSIS — M9901 Segmental and somatic dysfunction of cervical region: Secondary | ICD-10-CM | POA: Diagnosis not present

## 2017-01-03 DIAGNOSIS — M542 Cervicalgia: Secondary | ICD-10-CM | POA: Diagnosis not present

## 2017-01-03 DIAGNOSIS — M5136 Other intervertebral disc degeneration, lumbar region: Secondary | ICD-10-CM | POA: Diagnosis not present

## 2017-01-03 DIAGNOSIS — M9903 Segmental and somatic dysfunction of lumbar region: Secondary | ICD-10-CM | POA: Diagnosis not present

## 2017-01-03 MED ORDER — ZOLPIDEM TARTRATE 10 MG PO TABS: 10.0000 mg | ORAL_TABLET | Freq: Every evening | ORAL | 1 refills | Status: DC | PRN

## 2017-01-03 NOTE — Telephone Encounter (Signed)
Refilled: 07/01/16 Last OV: 11/29/16 Last Labs: 07/16/16 Future OV: 01/20/17 Please advise?

## 2017-01-03 NOTE — Telephone Encounter (Signed)
faxed

## 2017-01-03 NOTE — Telephone Encounter (Signed)
Pt called requesting a refill on his zolpidem (AMBIEN) 10 MG tablet. HE is requesting a 90 day supply. Please advise, thank you!  Pharmacy - [facility] [place] [place] [address] [place]  Call pt @ [phone]

## 2017-01-04 MED ORDER — MOMETASONE FUROATE 50 MCG/ACT NA SUSP: NASAL | 3 refills | Status: DC

## 2017-01-04 NOTE — Addendum Note (Signed)
Addended by: Carmin Muskrat on: 01/04/2017 11:51 AM   Modules accepted: Orders

## 2017-01-04 NOTE — Addendum Note (Signed)
Addended by: Carmin Muskrat on: 01/04/2017 12:49 PM   Modules accepted: Orders

## 2017-01-09 ENCOUNTER — Telehealth: Payer: Self-pay | Admitting: Family Medicine

## 2017-01-09 NOTE — Telephone Encounter (Signed)
Pt called and stated that he needs a PA for his zolpidem (AMBIEN) 10 MG tablet. Please advise, thank you!  Call pt @ [phone]

## 2017-01-11 DIAGNOSIS — M542 Cervicalgia: Secondary | ICD-10-CM | POA: Diagnosis not present

## 2017-01-11 DIAGNOSIS — M9901 Segmental and somatic dysfunction of cervical region: Secondary | ICD-10-CM | POA: Diagnosis not present

## 2017-01-11 DIAGNOSIS — M9903 Segmental and somatic dysfunction of lumbar region: Secondary | ICD-10-CM | POA: Diagnosis not present

## 2017-01-11 DIAGNOSIS — M5136 Other intervertebral disc degeneration, lumbar region: Secondary | ICD-10-CM | POA: Diagnosis not present

## 2017-01-12 NOTE — Telephone Encounter (Signed)
PA not received yet.

## 2017-01-12 NOTE — Telephone Encounter (Addendum)
PA was sent to wrong provider. PA created on CoverMyMeds. And sent to plan for approval.  Pt was notified.

## 2017-01-19 DIAGNOSIS — M9901 Segmental and somatic dysfunction of cervical region: Secondary | ICD-10-CM | POA: Diagnosis not present

## 2017-01-19 DIAGNOSIS — M5136 Other intervertebral disc degeneration, lumbar region: Secondary | ICD-10-CM | POA: Diagnosis not present

## 2017-01-19 DIAGNOSIS — M542 Cervicalgia: Secondary | ICD-10-CM | POA: Diagnosis not present

## 2017-01-19 DIAGNOSIS — M9903 Segmental and somatic dysfunction of lumbar region: Secondary | ICD-10-CM | POA: Diagnosis not present

## 2017-01-20 ENCOUNTER — Encounter: Payer: PPO | Admitting: Family Medicine

## 2017-01-30 ENCOUNTER — Encounter: Payer: PPO | Attending: Surgery | Admitting: Registered"

## 2017-01-30 DIAGNOSIS — Z79899 Other long term (current) drug therapy: Secondary | ICD-10-CM | POA: Diagnosis not present

## 2017-01-30 DIAGNOSIS — Z6841 Body Mass Index (BMI) 40.0 and over, adult: Secondary | ICD-10-CM | POA: Insufficient documentation

## 2017-01-30 DIAGNOSIS — F419 Anxiety disorder, unspecified: Secondary | ICD-10-CM | POA: Insufficient documentation

## 2017-01-30 DIAGNOSIS — G4733 Obstructive sleep apnea (adult) (pediatric): Secondary | ICD-10-CM | POA: Insufficient documentation

## 2017-01-30 DIAGNOSIS — J45909 Unspecified asthma, uncomplicated: Secondary | ICD-10-CM | POA: Insufficient documentation

## 2017-01-30 DIAGNOSIS — Z713 Dietary counseling and surveillance: Secondary | ICD-10-CM | POA: Insufficient documentation

## 2017-01-30 NOTE — Patient Instructions (Addendum)
   Consider replacing your sweet snacks at work, nuts (a few)   Consider taking boiled eggs for snacks  Continue taking left overs for lunch  Work on reducing portion sizes at dinner  Check label on soy milk for carbohydrate  Try a few different different protein shakes (15 g protein and less than 5 g cho)  Keep increasing your walking time at work

## 2017-01-30 NOTE — Progress Notes (Signed)
Supervised Weight Loss Appt start time:  1535    End time:  1620  Assessment: 3rd SWL visit (2nd SWL at [place], Pt states Feb visit with PCP, Thersa Salt counted for the 1st SWL visit.)  Patient is accompanied by his wife who wants to make sure she knows what to cook for him, she states she is concerned about his health. Pt reports cutting out fried foods, but reports eating a lot of cheese and will consider eating low-fat variety and smaller portions.  Surgery type: Sleeve Gastrectomy Start weight at [facility]: 273.6 lb Weight today: 282.3  Weight change from start: 8.7 lb weight gain (Pt has lost and gained weight with weight loss medication self reported starting and discontinued used.)  MEDICATIONS: See List.   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  Behavior: Pt states has cut out fried foods and taking leftovers for lunch at work.   Nutritional Diagnosis:  [place]-3.3 Overweight/obesity related to past poor dietary habits and physical inactivity as evidenced by patient w/ planned sleeve gastrectomysurgery and following dietary guidelines for continued weight loss.  Intervention:Nutrition counseling for upcoming Bariatric Surgery  Goals:  Consider replacing your sweet snacks at work, nuts (a few)   Consider taking boiled eggs for snacks  Continue taking left overs for lunch  Work on reducing portion sizes at dinner  Check label on soy milk for carbohydrates  Try a few different different protein shakes (15 g protein and less than 5 g cho)  Keep increasing your walking time  at work  Follow diet recommendations listed below   Energy and Macronutrient Recomendations: 1800calories 200 g carbohydrates 135g protein 50g fat  Handouts given:  Protein Engineer, production sample provided (chocolate)  Teaching Method Utilized:  Visual Auditory  Barriers to learning/adherenceto lifestyle change:none stated  Demonstrated degree of understanding via: Teach Back  Monitoring/Evaluation: Dietary intake, exercise, and body weight in 1 month.

## 2017-02-17 ENCOUNTER — Other Ambulatory Visit: Payer: Self-pay | Admitting: Family Medicine

## 2017-02-17 MED ORDER — GABAPENTIN 600 MG PO TABS: 600.0000 mg | ORAL_TABLET | Freq: Three times a day (TID) | ORAL | 3 refills | Status: DC

## 2017-02-17 NOTE — Telephone Encounter (Signed)
Refilled: 01/22/16 Last OV: 11/29/16 Last Labs: 07/16/16 Future OV: 03/30/17 Please advise?

## 2017-02-17 NOTE — Telephone Encounter (Signed)
Pt called about needing a refill for gabapentin (NEURONTIN) 600 MG tablet  Pharmacy is [facility] - [place], [place]  Call pt @ [phone]. Thank you!

## 2017-02-20 ENCOUNTER — Telehealth: Payer: Self-pay | Admitting: *Deleted

## 2017-02-20 MED ORDER — GABAPENTIN 600 MG PO TABS: 600.0000 mg | ORAL_TABLET | Freq: Three times a day (TID) | ORAL | 3 refills | Status: DC

## 2017-02-20 NOTE — Telephone Encounter (Signed)
Medication Refill requested for :gabapentin  Ph[facility]  Return Contact [phone] Please call pt when this is sent to pharmacy

## 2017-02-20 NOTE — Telephone Encounter (Signed)
rx sent to corrected pharmacy.

## 2017-02-27 ENCOUNTER — Ambulatory Visit: Payer: PPO | Admitting: Registered"

## 2017-03-02 ENCOUNTER — Encounter: Payer: PPO | Attending: Surgery | Admitting: Registered"

## 2017-03-02 DIAGNOSIS — Z713 Dietary counseling and surveillance: Secondary | ICD-10-CM | POA: Diagnosis not present

## 2017-03-02 DIAGNOSIS — J45909 Unspecified asthma, uncomplicated: Secondary | ICD-10-CM | POA: Diagnosis not present

## 2017-03-02 DIAGNOSIS — Z6841 Body Mass Index (BMI) 40.0 and over, adult: Secondary | ICD-10-CM | POA: Diagnosis not present

## 2017-03-02 DIAGNOSIS — Z79899 Other long term (current) drug therapy: Secondary | ICD-10-CM | POA: Insufficient documentation

## 2017-03-02 DIAGNOSIS — F419 Anxiety disorder, unspecified: Secondary | ICD-10-CM | POA: Insufficient documentation

## 2017-03-02 DIAGNOSIS — G4733 Obstructive sleep apnea (adult) (pediatric): Secondary | ICD-10-CM | POA: Insufficient documentation

## 2017-03-02 NOTE — Progress Notes (Signed)
Supervised Weight Loss Appt start time:  1140    End time:  1210  Assessment: 4th SWL visit (3rd SWL at [place], Pt states Feb visit with PCP, Thersa Salt counted for the 1st SWL visit.)  Pt states he was recently in [place] for 4 days and ate out every day. Pt states between the recent trip and his large appetite he is not surprised that he is gaining weight. Pt is worried that because he is gaining weight he may not qualify for surgery. Discussed options for snacks to help reduce caloric intake.  Pt states he is fearful about adding vegetables back into his diet because of experience with diverticulitis and IBS. RD advised if his symptoms are improved, he could try to add some vegetables in back slowly.  Surgery type: Sleeve Gastrectomy Start weight at [facility]: 273.6 lb Weight today: 286.8  Weight change from start: 13 lb weight gain (Pt has lost and gained weight with weight loss medication self reported starting and discontinued used.)  MEDICATIONS: See List.   DIETARY INTAKE:   24 hr Dietary Recall: First Meal: small hamburger out of vending machine (hasn't had time to cook more eggs)  Snack: protein shake Second Meal:  potato chips and chocolate bar  Snack: protein shakes Third Meal: baked chicken, (liver) potatoes, asparagus, sweet peas, carrots (other days fish, tuna salad, pork) Snack: if he doesn't eat snack can't sleep. 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 Also increased walking during his recent vacationvacation.  Behavior: Pt states has cut out fried foods and taking leftovers for lunch at work.   Nutritional Diagnosis:  [place]-3.3 Overweight/obesity related to past poor dietary habits and physical inactivity as evidenced by patient w/ planned sleeve gastrectomysurgery and following dietary guidelines for continued weight  loss.  Intervention:Nutrition counseling for upcoming Bariatric Surgery  Goals:  Consider replacing your sweet snacks at work, nuts (a few)   Return to your habit taking boiled eggs for snacks  Continue taking left overs for lunch  Work on reducing portion sizes at dinner  Check label on soy milk for carbohydrates  Try a few different different protein shakes (15 g protein and less than 5 g cho)  Keep increasing your walking time at work  [employer] looking for appropriate vitamins  Follow diet recommendations listed below   Energy and Macronutrient Recomendations: 1800calories 200 g carbohydrates 135g protein 50g fat  Handouts given:  Vitamins  Premier Protein Shake sample provided (chocolate)  Teaching Method Utilized:  Visual Auditory  Barriers to learning/adherenceto lifestyle change:none stated  Demonstrated degree of understanding via: Teach Back  Monitoring/Evaluation: Dietary intake, exercise, and body weight in 1 month.

## 2017-03-02 NOTE — Patient Instructions (Signed)
Goals:  Consider replacing your sweet snacks at work, nuts (a few)   Return to your habit taking boiled eggs for snacks  Continue taking left overs for lunch  Work on reducing portion sizes at dinner  Check label on soy milk for carbohydrates  Try a few different different protein shakes (15 g protein and less than 5 g cho)  Keep increasing your walking time at work  Start looking for appropriate vitamins

## 2017-03-03 ENCOUNTER — Telehealth: Payer: Self-pay | Admitting: Family Medicine

## 2017-03-03 MED ORDER — ROPINIROLE HCL 1 MG PO TABS: 3.0000 mg | ORAL_TABLET | Freq: Every day | ORAL | 3 refills | Status: DC

## 2017-03-03 NOTE — Telephone Encounter (Signed)
Refilled: 03/23/16 Last OV: 11/29/16 Last Labs: 07/16/16 Future OV: 03/30/17 Please advise?

## 2017-03-03 NOTE — Telephone Encounter (Signed)
Pt called and is requesting a refill on his rOPINIRole (REQUIP) 1 MG tablet. He is requesting just a 30 days supply. Please advise, thank you!  Pharmacy - [facility] [place], [place] - [place]  Call pt @ [phone]

## 2017-03-08 MED ORDER — ROPINIROLE HCL 1 MG PO TABS: 3.0000 mg | ORAL_TABLET | Freq: Every day | ORAL | 0 refills | Status: DC

## 2017-03-08 NOTE — Telephone Encounter (Signed)
Pt called back to follow up on the refill. Please advise?

## 2017-03-08 NOTE — Addendum Note (Signed)
Addended by: Bevelyn Ngo on: 03/08/2017 12:21 PM   Modules accepted: Orders

## 2017-03-08 NOTE — Telephone Encounter (Signed)
[facility] as requested, thanks

## 2017-03-08 NOTE — Telephone Encounter (Signed)
Pt would like the Rx to go to [place], [place]  This time only. Please and thank you!

## 2017-03-29 ENCOUNTER — Encounter: Payer: Self-pay | Admitting: Registered"

## 2017-03-29 ENCOUNTER — Encounter: Payer: PPO | Attending: Surgery | Admitting: Registered"

## 2017-03-29 DIAGNOSIS — J45909 Unspecified asthma, uncomplicated: Secondary | ICD-10-CM | POA: Diagnosis not present

## 2017-03-29 DIAGNOSIS — E669 Obesity, unspecified: Secondary | ICD-10-CM

## 2017-03-29 DIAGNOSIS — G4733 Obstructive sleep apnea (adult) (pediatric): Secondary | ICD-10-CM | POA: Diagnosis not present

## 2017-03-29 DIAGNOSIS — Z713 Dietary counseling and surveillance: Secondary | ICD-10-CM | POA: Diagnosis not present

## 2017-03-29 DIAGNOSIS — F419 Anxiety disorder, unspecified: Secondary | ICD-10-CM | POA: Diagnosis not present

## 2017-03-29 DIAGNOSIS — Z79899 Other long term (current) drug therapy: Secondary | ICD-10-CM | POA: Diagnosis not present

## 2017-03-29 DIAGNOSIS — Z6841 Body Mass Index (BMI) 40.0 and over, adult: Secondary | ICD-10-CM | POA: Diagnosis not present

## 2017-03-29 NOTE — Progress Notes (Signed)
Supervised Weight Loss Appt start time:  3:25  End time:  3:42  Assessment: 5th SWL visit (3rd SWL at [place], Pt states Feb visit with PCP, Thersa Salt counted for the 1st SWL visit.)  Pt arrived 9 min late having gained 4.1 lbs from previous visit. Pt states he has started ketogenic diet 1-2 weeks ago. Pt states he has chocolate bar most days.   Pt states he is fearful about adding vegetables back into his diet because of experience with diverticulitis and IBS. RD advised if his symptoms are improved, he could try to add some vegetables in back slowly.  Surgery type: Sleeve Gastrectomy Start weight at [facility]: 273.6 lb Weight today: 290.9 Weight change from start: 17.3 lb weight gain (Pt has lost and gained weight with weight loss medication self reported starting and discontinued used.)  MEDICATIONS: See List.   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 Also increased walking during his recent vacationvacation.  Behavior: Pt states is taking leftovers for lunch at work.   Nutritional Diagnosis:  [place]-3.3 Overweight/obesity related to past poor dietary habits and physical inactivity as evidenced by patient w/ planned sleeve gastrectomysurgery and following dietary guidelines for continued weight loss.  Intervention:Nutrition counseling for upcoming Bariatric Surgery  Goals: - Look for healthy snack options with fat and sugar in single digits on nutrition label such as Dannon Oikos Triple Zero greek yogurt or fresh fruit and cheese or protein shake.  Follow diet  recommendations listed below   Energy and Macronutrient Recomendations: 1800calories 200 g carbohydrates 135g protein 50g fat  Handouts given:  None  Teaching Method Utilized:  Visual Auditory  Barriers to learning/adherenceto lifestyle change:none stated  Demonstrated degree of understanding via: Teach Back  Monitoring/Evaluation: Dietary intake, exercise, and body weight in 1 month.

## 2017-03-29 NOTE — Patient Instructions (Signed)
-   Look for healthy snack options with fat and sugar in single digits on nutrition label such as Dannon Oikos Triple Zero greek yogurt or fresh fruit and cheese or protein shake.

## 2017-03-30 ENCOUNTER — Encounter: Payer: Self-pay | Admitting: Family Medicine

## 2017-03-30 ENCOUNTER — Ambulatory Visit (INDEPENDENT_AMBULATORY_CARE_PROVIDER_SITE_OTHER): Payer: PPO | Admitting: Family Medicine

## 2017-03-30 VITALS — BP 124/74 | HR 69 | Temp 98.5°F | Ht 65.75 in | Wt 292.0 lb

## 2017-03-30 DIAGNOSIS — Z0000 Encounter for general adult medical examination without abnormal findings: Secondary | ICD-10-CM | POA: Diagnosis not present

## 2017-03-30 DIAGNOSIS — Z23 Encounter for immunization: Secondary | ICD-10-CM | POA: Diagnosis not present

## 2017-03-30 NOTE — Patient Instructions (Signed)

## 2017-03-30 NOTE — Progress Notes (Signed)
Subjective:   Paul Cook is a 66 y.o. male who presents for a Welcome to Medicare exam.   Stable. Seeing Nutrition and bariatric surgery. Has upcoming Gastric sleeve later this year.  Review of Systems: Frequent urination, Numbness. Cardiac Risk Factors include: advanced age (>20men, >75 women);obesity (BMI >30kg/m2);sedentary lifestyle;dyslipidemia    Objective:    Today's Vitals   03/30/17 1540  BP: 124/74  Pulse: 69  Temp: 98.5 F (36.9 C)  TempSrc: Oral  SpO2: 96%  Weight: 292 lb (132.5 kg)  Height: 5' 5.75" (1.67 m)   Body mass index is 47.49 kg/m.  Medications Outpatient Encounter Prescriptions as of 03/30/2017  Medication Sig  . calcium carbonate (CALCIUM 600) 1500 (600 Ca) MG TABS tablet Take 600 mg of elemental calcium by mouth 3 (three) times daily with meals.  . dicyclomine (BENTYL) 10 MG capsule Take 1 capsule (10 mg total) by mouth 2 (two) times daily.  Marland Kitchen gabapentin (NEURONTIN) 600 MG tablet Take 1 tablet (600 mg total) by mouth 3 (three) times daily.  Marland Kitchen lisinopril-hydrochlorothiazide (PRINZIDE,ZESTORETIC) 20-25 MG tablet Take 1 tablet by mouth daily.  Marland Kitchen LORazepam (ATIVAN) 0.5 MG tablet Take 1-2 tablets (0.5-1 mg total) by mouth at bedtime.  . mometasone (NASONEX) 50 MCG/ACT nasal spray USE 2 SPRAYS NASALLY DAILY  . Multiple Vitamins-Minerals (MULTIVITAMIN GUMMIES MENS) CHEW Chew by mouth daily.  Marland Kitchen nystatin cream (MYCOSTATIN) Apply 1 application topically 2 (two) times daily.  . ranitidine (ZANTAC) 150 MG tablet Take 1 tablet (150 mg total) by mouth 2 (two) times daily.  Marland Kitchen rOPINIRole (REQUIP) 1 MG tablet Take 3 tablets (3 mg total) by mouth at bedtime.  . trazodone (DESYREL) 300 MG tablet Take 1 tablet (300 mg total) by mouth at bedtime.  Marland Kitchen zolpidem (AMBIEN) 10 MG tablet Take 1 tablet (10 mg total) by mouth at bedtime as needed. for sleep  . albuterol (PROVENTIL HFA;VENTOLIN HFA) 108 (90 BASE) MCG/ACT inhaler Inhale 2 puffs into the lungs every 6 (six) hours  as needed for wheezing or shortness of breath. (Patient not taking: Reported on 03/30/2017)  . Liraglutide -Weight Management (SAXENDA [place]) Inject into the skin.  . pantoprazole (PROTONIX) 40 MG tablet Take 1 tablet (40 mg total) by mouth daily. (Patient not taking: Reported on 03/30/2017)   No facility-administered encounter medications on file as of 03/30/2017.     History: Past Medical History:  Diagnosis Date  . Adenomatous colon polyp    tubular  . Adenomatous rectal polyp    tubular  . Asthma   . Depression   . Diverticulosis   . GERD (gastroesophageal reflux disease)   . Glaucoma    beginnings- no treatment yet   . History of chicken pox   . Hypertension   . Lung nodule    CT 10/2014  . Sleep apnea    wears cpap   Past Surgical History:  Procedure Laterality Date  . COLONOSCOPY    . COLONOSCOPY W/ POLYPECTOMY  2015   [facility], benign  . HERNIA REPAIR     inguinal  . POLYPECTOMY    . TONSILLECTOMY     age 84     Family History  Problem Relation Age of Onset  . Depression Mother   . Hypertension Father   . Heart disease Father   . Stroke Father   . Cancer Maternal Grandmother        ovarian?  . Cancer Paternal Grandfather        prostate  . Prostate cancer  Paternal Grandfather   . Colon cancer Neg Hx   . Colon polyps Neg Hx    Social History   Occupational History  . Not on file.   Social History Main Topics  . Smoking status: Never Smoker  . Smokeless tobacco: Never Used  . Alcohol use No  . Drug use: No  . Sexual activity: Not on file   Tobacco Counseling Nonsmoker.  Immunizations and Health Maintenance Immunization History  Administered Date(s) Administered  . Influenza Whole 06/19/2013  . Influenza,inj,Quad PF,36+ Mos 07/15/2014, 07/18/2016  . Influenza-Unspecified 06/18/2015  . Pneumococcal Conjugate-13 03/30/2017  . Tdap 04/17/2014  . Zoster 12/18/2013   Health Maintenance Due  Topic Date Due  . PNA vac Low Risk Adult (1 of 2 - PCV13)  09/05/2016    Activities of Daily Living Able to do all activities.   Physical Exam   Gen.: Well-appearing elderly male in no acute distress. HENT: Normocephalic atraumatic. Normal TMs bilaterally. Oropharynx clear.  Eyes: No scleral icterus. Normal conjunctiva. Neck: Supple. No thyromegaly or adenopathy. Heart: Regular rate and rhythm. No murmurs appreciated. No lower extremity edema. Lungs: Clear auscultation bilaterally. No rales, rhonchi, or wheezing. Abdomen: Soft, nontender, nondistended. No palpable organomegaly. No rebound or guarding. MSK: Normal range of motion. Neuro: No focal deficits. Psych: Normal mood and affect.  Advanced Directives: Does Patient Have a Medical Advance Directive?: No Would patient like information on creating a medical advance directive?: No - Patient declined    Assessment:    This is a routine wellness  examination for this patient.  Vision/Hearing screen  Visual Acuity Screening   Right eye Left eye Both eyes  Without correction:     With correction: 20/50 20/50 20/40   Hearing Screening Comments: No complaints. Passed Whisper test.   Dietary issues and exercise activities discussed:  Advised to exercise regularly. He exercises infrequently (walking). Seeing nutrition in preps for Bariatric surgery.  Depression Screen PHQ 2/9 Scores 03/29/2017 03/02/2017 01/30/2017 12/28/2016  PHQ - 2 Score 0 0 0 0    Fall Risk Fall Risk  03/29/2017  Falls in the past year? No   Cognitive Function Normal mental status. No concerns. Deferred.   Patient Care Team: Coral Spikes, DO as PCP - General (Family Medicine) Also seeing Bariatric surgery [facility]).     Plan:     I have personally reviewed and noted the following in the patient's chart:   . Medical and social history . Use of alcohol, tobacco or illicit drugs  . Current medications and supplements . Functional ability and status . Nutritional status . Physical  activity . Advanced directives . List of other physicians . Hospitalizations, surgeries, and ER visits in previous 12 months . Vitals . Screenings to include cognitive, depression, and falls . Referrals and appointments  In addition, I have reviewed and discussed with patient certain preventive protocols, quality metrics, and best practice recommendations. A written personalized care plan for preventive services as well as general preventive health recommendations were provided to patient.  Patient declined Hep C and HIV screening. Wants to wait on Shingrix. Prevnar given today.    Coral Spikes, DO 03/30/2017

## 2017-03-31 ENCOUNTER — Other Ambulatory Visit: Payer: Self-pay | Admitting: *Deleted

## 2017-03-31 MED ORDER — DICYCLOMINE HCL 10 MG PO CAPS: 10.0000 mg | ORAL_CAPSULE | Freq: Two times a day (BID) | ORAL | 1 refills | Status: DC

## 2017-03-31 MED ORDER — ROPINIROLE HCL 1 MG PO TABS: 3.0000 mg | ORAL_TABLET | Freq: Every day | ORAL | 0 refills | Status: DC

## 2017-03-31 NOTE — Telephone Encounter (Signed)
Please advise for the refill, he is wanting the requip increased also, see his message, thanks

## 2017-03-31 NOTE — Telephone Encounter (Signed)
Patient has requested a medication refill for ropinirole. Pt has requested increase in dosage to 2 mg  He will also need dicyclomine refilled  [facility]acy [facility] -90 day supply

## 2017-04-21 ENCOUNTER — Other Ambulatory Visit (HOSPITAL_COMMUNITY): Payer: Self-pay

## 2017-04-21 ENCOUNTER — Ambulatory Visit (HOSPITAL_COMMUNITY): Payer: PPO

## 2017-04-21 ENCOUNTER — Ambulatory Visit: Payer: PPO

## 2017-04-26 ENCOUNTER — Other Ambulatory Visit: Payer: Self-pay | Admitting: Family Medicine

## 2017-04-26 ENCOUNTER — Encounter: Payer: PPO | Attending: Surgery | Admitting: Registered"

## 2017-04-26 DIAGNOSIS — Z713 Dietary counseling and surveillance: Secondary | ICD-10-CM | POA: Insufficient documentation

## 2017-04-26 DIAGNOSIS — Z79899 Other long term (current) drug therapy: Secondary | ICD-10-CM | POA: Insufficient documentation

## 2017-04-26 DIAGNOSIS — J45909 Unspecified asthma, uncomplicated: Secondary | ICD-10-CM | POA: Insufficient documentation

## 2017-04-26 DIAGNOSIS — Z6841 Body Mass Index (BMI) 40.0 and over, adult: Secondary | ICD-10-CM | POA: Insufficient documentation

## 2017-04-26 DIAGNOSIS — G4733 Obstructive sleep apnea (adult) (pediatric): Secondary | ICD-10-CM | POA: Diagnosis not present

## 2017-04-26 DIAGNOSIS — F419 Anxiety disorder, unspecified: Secondary | ICD-10-CM | POA: Insufficient documentation

## 2017-04-26 DIAGNOSIS — E669 Obesity, unspecified: Secondary | ICD-10-CM

## 2017-04-26 NOTE — Patient Instructions (Signed)
1. Decrease caffeine intake with coffee: mix half decaf with half caffeinated and continue to reduce caffeine daily.  2. Replace milk in coffee with Premier Protein caramel flavor.  3. Replace chocolate bar with beef jerky as snack option. 4. Increase physical activity with arm exercises during the day, 15 min 3x/week.

## 2017-04-26 NOTE — Telephone Encounter (Signed)
Last filled 03/31/17 Last office visit 03/30/17 Next office visit 10/02/17 Previously filled by Dr Gilford Rile

## 2017-04-26 NOTE — Progress Notes (Signed)
Supervised Weight Loss Appt start time:  3:44  End time:  4:12  Assessment: 6th SWL visit (3rd SWL at [place], Pt states Feb visit with PCP, Thersa Salt counted for the 1st SWL visit.)  Pt arrived having lost 2.5 lbs from previous visit. Pt states he has started ketogenic diet with his wife. Pt states he has a chocolate bar most days. This is pts last SWL visit with [place] prior to surgery.   Pt states he is fearful about adding vegetables back into his diet because of experience with diverticulitis and IBS. RD advised if his symptoms are improved, he could try to add some vegetables in back slowly.   Surgery type: Sleeve Gastrectomy Start weight at [facility]: 273.6 lb Weight today: 288.4 Weight change from start: 2.5 lb weight loss (Pt has lost and gained weight with weight loss medication self reported starting and discontinued used.)  MEDICATIONS: See List.   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  Behavior: Pt states plans to replace chocolate bar with beef jerky.   Nutritional Diagnosis:  [place]-3.3 Overweight/obesity related to past poor dietary habits and physical inactivity as evidenced by patient w/ planned sleeve gastrectomysurgery and following dietary guidelines for continued weight loss.  Intervention:Nutrition counseling for upcoming Bariatric Surgery  Goals: 1. Decrease caffeine intake with coffee: mix half decaf with half caffeinated and continue to reduce caffeine daily.  2. Replace milk in coffee with Premier Protein caramel flavor.  3. Replace chocolate bar with beef jerky as snack option. 4. Increase physical activity with arm exercises during  the day, 15 min 3x/week.   Follow diet recommendations listed below   Energy and Macronutrient Recomendations: 1800calories 200 g carbohydrates 135g protein 50g fat  Handouts given:  Protein Shakes  Teaching Method Utilized:  Visual Auditory  Barriers to learning/adherenceto lifestyle change:none stated  Demonstrated degree of understanding via: Teach Back  Monitoring/Evaluation: Dietary intake, exercise, and body weight in 1 month.

## 2017-05-04 ENCOUNTER — Ambulatory Visit: Payer: PPO | Admitting: Psychiatry

## 2017-05-09 DIAGNOSIS — M9901 Segmental and somatic dysfunction of cervical region: Secondary | ICD-10-CM | POA: Diagnosis not present

## 2017-05-09 DIAGNOSIS — M5134 Other intervertebral disc degeneration, thoracic region: Secondary | ICD-10-CM | POA: Diagnosis not present

## 2017-05-09 DIAGNOSIS — M9902 Segmental and somatic dysfunction of thoracic region: Secondary | ICD-10-CM | POA: Diagnosis not present

## 2017-05-09 DIAGNOSIS — M25511 Pain in right shoulder: Secondary | ICD-10-CM | POA: Diagnosis not present

## 2017-05-09 DIAGNOSIS — M542 Cervicalgia: Secondary | ICD-10-CM | POA: Diagnosis not present

## 2017-05-09 DIAGNOSIS — M5136 Other intervertebral disc degeneration, lumbar region: Secondary | ICD-10-CM | POA: Diagnosis not present

## 2017-05-09 DIAGNOSIS — M9903 Segmental and somatic dysfunction of lumbar region: Secondary | ICD-10-CM | POA: Diagnosis not present

## 2017-05-11 DIAGNOSIS — M9903 Segmental and somatic dysfunction of lumbar region: Secondary | ICD-10-CM | POA: Diagnosis not present

## 2017-05-11 DIAGNOSIS — M5136 Other intervertebral disc degeneration, lumbar region: Secondary | ICD-10-CM | POA: Diagnosis not present

## 2017-05-11 DIAGNOSIS — M542 Cervicalgia: Secondary | ICD-10-CM | POA: Diagnosis not present

## 2017-05-11 DIAGNOSIS — M9902 Segmental and somatic dysfunction of thoracic region: Secondary | ICD-10-CM | POA: Diagnosis not present

## 2017-05-11 DIAGNOSIS — M25511 Pain in right shoulder: Secondary | ICD-10-CM | POA: Diagnosis not present

## 2017-05-11 DIAGNOSIS — M9901 Segmental and somatic dysfunction of cervical region: Secondary | ICD-10-CM | POA: Diagnosis not present

## 2017-05-11 DIAGNOSIS — M5134 Other intervertebral disc degeneration, thoracic region: Secondary | ICD-10-CM | POA: Diagnosis not present

## 2017-05-12 ENCOUNTER — Ambulatory Visit: Payer: PPO | Attending: Family Medicine

## 2017-05-12 ENCOUNTER — Ambulatory Visit: Payer: PPO

## 2017-05-22 ENCOUNTER — Encounter: Payer: Self-pay | Admitting: Family Medicine

## 2017-05-22 ENCOUNTER — Other Ambulatory Visit: Payer: Self-pay | Admitting: Family Medicine

## 2017-05-24 ENCOUNTER — Other Ambulatory Visit: Payer: PPO

## 2017-05-24 ENCOUNTER — Ambulatory Visit: Payer: PPO

## 2017-05-25 ENCOUNTER — Telehealth: Payer: Self-pay | Admitting: *Deleted

## 2017-05-25 NOTE — Telephone Encounter (Signed)
Patient advised of below and verbalized understanding.  

## 2017-05-25 NOTE — Telephone Encounter (Signed)
Patient stated that he has increased his dosage on ropinirole due to restless legs, he is now taking 8 tablets daily, this has helped his restless leg and he has no symptoms. He has requested the increase in dosage to 8 mg daily or a new script . Pt contact [phone]

## 2017-05-25 NOTE — Telephone Encounter (Signed)
Max 4 mg a day for restless leg.

## 2017-05-30 ENCOUNTER — Other Ambulatory Visit: Payer: PPO

## 2017-05-30 ENCOUNTER — Ambulatory Visit: Payer: PPO

## 2017-05-30 ENCOUNTER — Other Ambulatory Visit: Payer: Self-pay | Admitting: Family Medicine

## 2017-05-30 MED ORDER — LISINOPRIL-HYDROCHLOROTHIAZIDE 20-25 MG PO TABS: 1.0000 | ORAL_TABLET | Freq: Every day | ORAL | 1 refills | Status: DC

## 2017-05-30 MED ORDER — ZOLPIDEM TARTRATE 10 MG PO TABS: 10.0000 mg | ORAL_TABLET | Freq: Every evening | ORAL | 1 refills | Status: DC | PRN

## 2017-05-30 NOTE — Telephone Encounter (Signed)
Last office visit 03/30/17 Next office visit 10/02/17 Dr Caryl Bis  [facility]

## 2017-05-30 NOTE — Telephone Encounter (Signed)
Pt called requesting a refill on his lisinopril-hydrochlorothiazide (PRINZIDE,ZESTORETIC) 20-25 MG tablet, and zolpidem (AMBIEN) 10 MG tablet. Please advise, thank you!  [place], [place]

## 2017-05-30 NOTE — Telephone Encounter (Signed)
Script faxed to [facility] order

## 2017-06-08 ENCOUNTER — Other Ambulatory Visit: Payer: Self-pay | Admitting: Family Medicine

## 2017-06-08 ENCOUNTER — Ambulatory Visit: Payer: PPO | Admitting: Psychiatry

## 2017-06-08 MED ORDER — TRAZODONE HCL 300 MG PO TABS: 300.0000 mg | ORAL_TABLET | Freq: Every day | ORAL | 1 refills | Status: DC

## 2017-06-08 NOTE — Telephone Encounter (Signed)
Pt called needing a refill for trazodone (DESYREL) 300 MG tablet. 90 day supply  Pharmacy is [facility] - [place], [place]  Call pt @ [phone]. Thank you!

## 2017-06-08 NOTE — Telephone Encounter (Signed)
Last ov visit 03/30/17 Next office visit 10/02/17 Dr Caryl Bis  When try to refill flags with high dose of trazodone

## 2017-06-12 ENCOUNTER — Telehealth: Payer: Self-pay | Admitting: Family Medicine

## 2017-06-12 ENCOUNTER — Other Ambulatory Visit: Payer: Self-pay | Admitting: Family Medicine

## 2017-06-12 ENCOUNTER — Telehealth: Payer: Self-pay | Admitting: *Deleted

## 2017-06-12 NOTE — Telephone Encounter (Signed)
It appears this was [facility].

## 2017-06-12 NOTE — Telephone Encounter (Signed)
Pt requested a medication refill for ropinirole 90 day supply Pharmacy [facility]

## 2017-06-12 NOTE — Telephone Encounter (Signed)
Pt called office this after noon. Still waiting for a call. He is totally out of his medication. Please call patient at [phone].

## 2017-06-12 NOTE — Telephone Encounter (Signed)
Spoke with patient and patient states that he only received 30 pills on 05/23/17 and he is taking 4 tablets every night. [facility] called [facility] to see how many they filled and the pharmacist said that the prescription they filled was for 90 tablets and was picked up. The patient has called the pharmacy twice, once asking if he picked up his medication because he could not remember and again asking if we had sent in a refill yet. Please advise.

## 2017-06-13 ENCOUNTER — Other Ambulatory Visit: Payer: PPO

## 2017-06-13 ENCOUNTER — Ambulatory Visit: Payer: PPO

## 2017-06-14 NOTE — Telephone Encounter (Signed)
Medication was sent in Monday

## 2017-06-23 ENCOUNTER — Telehealth: Payer: Self-pay | Admitting: Family Medicine

## 2017-06-23 NOTE — Telephone Encounter (Signed)
Called [facility] spoke to [place]  they never received script for Ambien . Refaxed script .

## 2017-06-23 NOTE — Telephone Encounter (Signed)
Pt called requesting a refill on his zolpidem (AMBIEN) 10 MG tablet. Pt called pharmacy and they stated that they did not have a rx for this. Please advise, thank you!  [place], [place]

## 2017-06-30 ENCOUNTER — Telehealth: Payer: Self-pay | Admitting: *Deleted

## 2017-06-30 NOTE — Telephone Encounter (Signed)
Pt has requested a medication refill for ropinirole , pt stated that he was advised to take this 4 times a day 90 day supply  Pharmacy [facility] on [address]

## 2017-06-30 NOTE — Telephone Encounter (Signed)
Refill request for requip, last seen [id number], last filled [id number].  Please advise.

## 2017-07-01 NOTE — Telephone Encounter (Signed)
Okay to refill? 

## 2017-07-03 ENCOUNTER — Other Ambulatory Visit: Payer: Self-pay | Admitting: Family Medicine

## 2017-07-03 MED ORDER — ROPINIROLE HCL 1 MG PO TABS: ORAL_TABLET | ORAL | 0 refills | Status: DC

## 2017-07-03 NOTE — Telephone Encounter (Signed)
Pt needs ropinirole sent to [facility]. [place]

## 2017-07-03 NOTE — Telephone Encounter (Signed)
t has requested to have a week supply of this script sent to [facility] . Please advise

## 2017-07-03 NOTE — Telephone Encounter (Signed)
Pt called and stated that he will run out of medication before he gets it from [place]. Can we [facility]'s [facility]. Call pt. Please advise, thank you!  Call pt @ [phone]

## 2017-07-04 ENCOUNTER — Telehealth: Payer: Self-pay | Admitting: *Deleted

## 2017-07-04 NOTE — Telephone Encounter (Signed)
Tried calling patient voicemail full

## 2017-07-04 NOTE — Telephone Encounter (Signed)
Pt has requested a medication for zolpidem  Pharmacy [facility]

## 2017-07-05 NOTE — Telephone Encounter (Signed)
I faxed refill to [facility] for 90 days on 05/30/17 . Now requesting week supply to [facility]

## 2017-07-06 ENCOUNTER — Other Ambulatory Visit: Payer: Self-pay | Admitting: Family Medicine

## 2017-07-06 MED ORDER — ROPINIROLE HCL 1 MG PO TABS: ORAL_TABLET | ORAL | 0 refills | Status: DC

## 2017-07-10 ENCOUNTER — Other Ambulatory Visit: Payer: Self-pay

## 2017-07-10 NOTE — Telephone Encounter (Signed)
Last office visit 03/30/17 Next office visit 10/02/17

## 2017-07-11 ENCOUNTER — Telehealth: Payer: Self-pay | Admitting: *Deleted

## 2017-07-11 NOTE — Telephone Encounter (Signed)
This was refilled in August, please advise for further refills, thanks

## 2017-07-11 NOTE — Telephone Encounter (Signed)
Pt requested medication refill for zolpidem 90 day supply  Pharmacy [facility]

## 2017-07-14 MED ORDER — ZOLPIDEM TARTRATE 10 MG PO TABS: 10.0000 mg | ORAL_TABLET | Freq: Every evening | ORAL | 1 refills | Status: DC | PRN

## 2017-07-14 NOTE — Telephone Encounter (Signed)
Script faxed to [facility]

## 2017-07-17 ENCOUNTER — Telehealth: Payer: Self-pay | Admitting: *Deleted

## 2017-07-17 NOTE — Telephone Encounter (Signed)
Opened in error see previous note. °

## 2017-07-17 NOTE — Telephone Encounter (Signed)
Pt requested a call  Pt contact [phone]

## 2017-07-18 ENCOUNTER — Other Ambulatory Visit: Payer: Self-pay | Admitting: Family Medicine

## 2017-07-18 NOTE — Telephone Encounter (Signed)
Pt called back to follow up on his Rx. Thank you!

## 2017-07-18 NOTE — Telephone Encounter (Signed)
Pt contacted [facility] and was told that his medication would arrive yesterday but it did not arrive. Pt has no more medication. Pt would like to know if he could have a Rx called into his local pharmacy.  Pt states if possible if the other Rx can be cancelled.  Pharmacy is [id number] - [place], [place] - [place]  Call pt @ [phone]. Thank you!

## 2017-07-18 NOTE — Telephone Encounter (Signed)
PT called back again. He wants to know if we could call in a few pills to help him sleep. Please advise, thank you!

## 2017-07-18 NOTE — Telephone Encounter (Signed)
Pt called back stating his mail order told him 10 business days is when he'll receive his medication. Pt needs more than three days of pills. Please advise?

## 2017-07-18 NOTE — Telephone Encounter (Signed)
Please advise for a few pills to local pharmacy as he is waiting for the mail order?

## 2017-07-18 NOTE — Telephone Encounter (Signed)
Patient came in a picked up prior prescription.

## 2017-07-18 NOTE — Telephone Encounter (Signed)
See below,

## 2017-07-25 ENCOUNTER — Telehealth: Payer: Self-pay | Admitting: Family Medicine

## 2017-07-25 MED ORDER — ROPINIROLE HCL 1 MG PO TABS: ORAL_TABLET | ORAL | 0 refills | Status: DC

## 2017-07-25 NOTE — Telephone Encounter (Signed)
Refill sent needs an appt

## 2017-07-25 NOTE — Telephone Encounter (Signed)
Pt called requesting a refill on his rOPINIRole (REQUIP) 1 MG tablet. Pt states that Dr. Lacinda Axon increased the dosage to 4 tablets daily and that he ran out sooner than he thought. I asked pt which pharmacy he wanted to use and he stated [place], [place]. He is completely out of medication. Please advise, thank you!

## 2017-08-02 ENCOUNTER — Telehealth: Payer: Self-pay | Admitting: Family Medicine

## 2017-08-02 NOTE — Telephone Encounter (Signed)
Medication sent to mail order

## 2017-08-02 NOTE — Telephone Encounter (Signed)
[facility] called and needs to clarification the direction for pt's rOPINIRole (REQUIP) 1 MG tablet. Please advise, thank you!

## 2017-08-07 NOTE — Telephone Encounter (Signed)
Patient wanted the medication to go to local pharmacy, not [facility].  thanks

## 2017-08-08 ENCOUNTER — Ambulatory Visit: Payer: PPO | Admitting: Psychiatry

## 2017-08-13 ENCOUNTER — Other Ambulatory Visit: Payer: Self-pay | Admitting: Family Medicine

## 2017-08-13 MED ORDER — ROPINIROLE HCL 1 MG PO TABS: ORAL_TABLET | ORAL | 1 refills | Status: DC

## 2017-08-15 ENCOUNTER — Telehealth: Payer: Self-pay | Admitting: Family Medicine

## 2017-08-15 ENCOUNTER — Other Ambulatory Visit: Payer: PPO

## 2017-08-15 ENCOUNTER — Ambulatory Visit: Payer: PPO

## 2017-08-15 NOTE — Telephone Encounter (Signed)
Pt called needing a refill for rOPINIRole (REQUIP) 1 MG tablet 4 tabs a day 90 day supply.  Pharmacy is [id number] - [place], [place] - [place]  Call pt @ [phone]. Thank you!

## 2017-08-18 ENCOUNTER — Telehealth: Payer: Self-pay

## 2017-08-18 MED ORDER — ROPINIROLE HCL 1 MG PO TABS: ORAL_TABLET | ORAL | 1 refills | Status: DC

## 2017-08-18 MED ORDER — ROPINIROLE HCL 1 MG PO TABS: ORAL_TABLET | ORAL | 0 refills | Status: DC

## 2017-08-18 NOTE — Telephone Encounter (Signed)
Already sent to the pharmacy.

## 2017-08-18 NOTE — Telephone Encounter (Signed)
Please advise  Copied from [place] [id number]. Topic: Inquiry >> Aug 18, 2017 11:47 AM Malena Catholic I, NT wrote: Pt call for hi Px Refill Ropinirole 1MG  Pt state he out off it Call the pt went is Ready Thank

## 2017-08-18 NOTE — Telephone Encounter (Signed)
Sent to pharmacy 

## 2017-08-18 NOTE — Telephone Encounter (Signed)
Please advise 

## 2017-08-18 NOTE — Telephone Encounter (Signed)
Last OV 03/30/17 with DR.Cook last filled 08/13/17 360 1rf already sent to [facility] Patient states pharmacy did not receive rx, resent rx Patient is out of medication and would like a short term [facility]

## 2017-08-18 NOTE — Telephone Encounter (Signed)
Next office visit 10/02/17  see below message for Requip  Spoke with mail order pharmacy [facility] and verified they received script they're in process of filling script.

## 2017-08-18 NOTE — Telephone Encounter (Signed)
Pt. Called back and said that the pharmacy said that it will take up to 7 days for him to get his rx. But he needs something before then. Asked if a smaller rx. Can be sent over so it will not take as long. Only has enough tablets left for 3 days   call pt. @ [phone]

## 2017-08-21 NOTE — Telephone Encounter (Signed)
Please advise 

## 2017-08-21 NOTE — Telephone Encounter (Signed)
Patient has been notified

## 2017-08-21 NOTE — Telephone Encounter (Signed)
Pt called and would like to have his Rx sent to the pharmacy because pt is going out of town on Wednesday. Pt was told his medication will not arrive on time. rOPINIRole (REQUIP) 1 MG tablet  Pharmacy is [id number] - [place], [place]  Call pt @ [phone]. Thank you!

## 2017-08-21 NOTE — Telephone Encounter (Signed)
Left voice mail to call back, HIPPA complaint . Script has already been sent to pharmacy.

## 2017-08-21 NOTE — Telephone Encounter (Signed)
Called pharmacy and patient picked up script today.

## 2017-08-21 NOTE — Telephone Encounter (Signed)
Pt called and said to please call him back. thanks

## 2017-09-13 ENCOUNTER — Other Ambulatory Visit: Payer: Self-pay | Admitting: Family Medicine

## 2017-09-14 NOTE — Telephone Encounter (Signed)
Last OV with Dr.Cook 03/30/17 last filled 08/18/17 120 0rf

## 2017-09-18 ENCOUNTER — Telehealth: Payer: Self-pay | Admitting: Family Medicine

## 2017-09-18 NOTE — Telephone Encounter (Signed)
Copied from [place] [phone] [phone]. Topic: Quick Communication - See Telephone Encounter >> Sep 18, 2017  3:22 PM Burnis Medin, NT wrote: CRM for notification. See Telephone encounter for: Pt is calling to see if she can get a refill on rOPINIRole (REQUIP) 1 MG tablet. Pt uses [facility] on [address]. Pt is pout of medication  09/18/17.

## 2017-09-19 ENCOUNTER — Encounter: Payer: Self-pay | Admitting: *Deleted

## 2017-09-19 NOTE — Telephone Encounter (Signed)
Patient needs to contact the pharmacy.  This was sent in on 30 November.

## 2017-09-19 NOTE — Telephone Encounter (Signed)
Pt  Is  Requesting  A  Refill  Of  requip . Pt  Has  An  appointment  With  Dr Caryl Bis  On  12/17 /2018

## 2017-09-19 NOTE — Telephone Encounter (Signed)
Patient calling again today and states he is completely out of medication and needs the refill if possible.

## 2017-09-19 NOTE — Telephone Encounter (Signed)
Patient is scheduled with you on 12/17. Ok to refill until appt?

## 2017-09-19 NOTE — Telephone Encounter (Signed)
This encounter was created in error - please disregard.

## 2017-09-19 NOTE — Telephone Encounter (Signed)
Please advise 

## 2017-09-19 NOTE — Telephone Encounter (Signed)
Patient is aware 

## 2017-09-29 ENCOUNTER — Ambulatory Visit: Payer: Self-pay | Admitting: Family Medicine

## 2017-10-02 ENCOUNTER — Ambulatory Visit: Payer: Self-pay | Admitting: Family Medicine

## 2017-10-02 ENCOUNTER — Other Ambulatory Visit: Payer: Self-pay

## 2017-10-02 ENCOUNTER — Ambulatory Visit: Payer: PPO | Admitting: Family Medicine

## 2017-10-02 ENCOUNTER — Encounter: Payer: Self-pay | Admitting: Family Medicine

## 2017-10-02 VITALS — BP 140/78 | HR 75 | Temp 98.0°F | Wt 290.6 lb

## 2017-10-02 DIAGNOSIS — G2581 Restless legs syndrome: Secondary | ICD-10-CM | POA: Diagnosis not present

## 2017-10-02 DIAGNOSIS — G4733 Obstructive sleep apnea (adult) (pediatric): Secondary | ICD-10-CM

## 2017-10-02 DIAGNOSIS — I10 Essential (primary) hypertension: Secondary | ICD-10-CM

## 2017-10-02 DIAGNOSIS — D72829 Elevated white blood cell count, unspecified: Secondary | ICD-10-CM | POA: Diagnosis not present

## 2017-10-02 NOTE — Progress Notes (Signed)
  Tommi Rumps, MD Phone: [phone]  Samay Delcarlo is a 66 y.o. male who presents today for follow-up.    Restless leg syndrome: Patient has been taking Requip 4 mg nightly.  At least 50% of the time this is helpful. Some nights he takes up to 6 mg.  That is more beneficial those nights.  Feels like he just cannot stop his legs from moving at times.  Hypertension: Notes this has been stable for some time now.  Typically similar to today or better controlled.  He is taking his medication.  No chest pain, shortness of breath, or edema.  OSA: Using his CPAP nightly.  Using it for 6 hours.  He does wake up well rested.  No hypersomnia.  He does take Ambien and has been doing this for years.  This does not make him drowsy.  Social History   Tobacco Use  Smoking Status Never Smoker  Smokeless Tobacco Never Used     ROS see history of present illness  Objective  Physical Exam Vitals:   10/02/17 1520  BP: 140/78  Pulse: 75  Temp: 98 F (36.7 C)  SpO2: 96%    BP Readings from Last 3 Encounters:  10/02/17 140/78  03/30/17 124/74  11/29/16 (!) 155/83   Wt Readings from Last 3 Encounters:  10/02/17 290 lb 9.6 oz (131.8 kg)  04/26/17 288 lb 6.4 oz (130.8 kg)  03/30/17 292 lb (132.5 kg)    Physical Exam  Constitutional: No distress.  Cardiovascular: Normal rate, regular rhythm and normal heart sounds.  Pulmonary/Chest: Effort normal and breath sounds normal.  Musculoskeletal: He exhibits no edema.  Neurological: He is alert. Gait normal.  Skin: Skin is warm and dry. He is not diaphoretic.     Assessment/Plan: Please see individual problem list.  Essential hypertension, benign Relatively well controlled.  Continue current regimen.  Check lab work.  Obstructive sleep apnea Well-controlled.  Continue CPAP.  Ambien as needed.  Restless legs Chronically on Requip 4 mg.  Discussed with our pharmacist who noted 6 mg would likely be tolerable as long as the patient was  not getting excessively drowsy.  We will check a ferritin level and if low we will supplement to see if that would make a difference prior to advising increased dose of Requip.   Zacharey was seen today for follow-up.  Diagnoses and all orders for this visit:  RLS (restless legs syndrome) -     Ferritin  Leukocytosis, unspecified type -     Cancel: CBC -     CBC w/Diff  Essential hypertension -     Comp Met (CMET)  Essential hypertension, benign  Obstructive sleep apnea  Restless legs    Orders Placed This Encounter  Procedures  . Comp Met (CMET)  . Ferritin  . CBC w/Diff    No orders of the defined types were placed in this encounter.    Tommi Rumps, MD [place]

## 2017-10-02 NOTE — Patient Instructions (Signed)
Nice to see you. We will get some lab work today and contact you with the results. We will contact you once we speak to the pharmacist regarding the Requip.

## 2017-10-03 LAB — COMPREHENSIVE METABOLIC PANEL
ALT: 31 U/L (ref 0–53)
AST: 23 U/L (ref 0–37)
Albumin: 4.3 g/dL (ref 3.5–5.2)
Alkaline Phosphatase: 49 U/L (ref 39–117)
BUN: 28 mg/dL — ABNORMAL HIGH (ref 6–23)
CO2: 28 mEq/L (ref 19–32)
Calcium: 9.4 mg/dL (ref 8.4–10.5)
Chloride: 100 mEq/L (ref 96–112)
Creatinine, Ser: 1.23 mg/dL (ref 0.40–1.50)
GFR: 62.56 mL/min (ref >60.00)
Glucose, Bld: 124 mg/dL — ABNORMAL HIGH (ref 70–99)
Potassium: 4.2 mEq/L (ref 3.5–5.1)
Sodium: 137 mEq/L (ref 135–145)
Total Bilirubin: 0.5 mg/dL (ref 0.2–1.2)
Total Protein: 7 g/dL (ref 6.0–8.3)

## 2017-10-03 LAB — CBC WITH DIFFERENTIAL/PLATELET
Basophils Absolute: 0.1 10*3/uL (ref 0.0–0.1)
Basophils Relative: 1 % (ref 0.0–3.0)
Eosinophils Absolute: 0.1 10*3/uL (ref 0.0–0.7)
Eosinophils Relative: 1.1 % (ref 0.0–5.0)
HCT: 43.1 % (ref 39.0–52.0)
Hemoglobin: 14.3 g/dL (ref 13.0–17.0)
Lymphocytes Relative: 19.9 % (ref 12.0–46.0)
Lymphs Abs: 2.2 10*3/uL (ref 0.7–4.0)
MCHC: 33.3 g/dL (ref 30.0–36.0)
MCV: 90.1 fl (ref 78.0–100.0)
Monocytes Absolute: 0.7 10*3/uL (ref 0.1–1.0)
Monocytes Relative: 6.4 % (ref 3.0–12.0)
Neutro Abs: 8 10*3/uL — ABNORMAL HIGH (ref 1.4–7.7)
Neutrophils Relative %: 71.6 % (ref 43.0–77.0)
Platelets: 186 10*3/uL (ref 150.0–400.0)
RBC: 4.79 Mil/uL (ref 4.22–5.81)
RDW: 14.3 % (ref 11.5–15.5)
WBC: 11.2 10*3/uL — ABNORMAL HIGH (ref 4.0–10.5)

## 2017-10-03 LAB — FERRITIN: Ferritin: 190.3 ng/mL (ref 22.0–322.0)

## 2017-10-03 NOTE — Assessment & Plan Note (Signed)
Chronically on Requip 4 mg.  Discussed with our pharmacist who noted 6 mg would likely be tolerable as long as the patient was not getting excessively drowsy.  We will check a ferritin level and if low we will supplement to see if that would make a difference prior to advising increased dose of Requip.

## 2017-10-03 NOTE — Assessment & Plan Note (Signed)
Relatively well controlled.  Continue current regimen.  Check lab work.

## 2017-10-03 NOTE — Assessment & Plan Note (Signed)
Well-controlled.  Continue CPAP.  Ambien as needed.

## 2017-10-04 ENCOUNTER — Other Ambulatory Visit: Payer: Self-pay | Admitting: Family Medicine

## 2017-10-04 DIAGNOSIS — R7309 Other abnormal glucose: Secondary | ICD-10-CM

## 2017-10-04 DIAGNOSIS — D72829 Elevated white blood cell count, unspecified: Secondary | ICD-10-CM

## 2017-10-04 MED ORDER — ROPINIROLE HCL 2 MG PO TABS: 6.0000 mg | ORAL_TABLET | Freq: Every day | ORAL | 2 refills | Status: DC

## 2017-10-09 ENCOUNTER — Telehealth: Payer: Self-pay | Admitting: Family Medicine

## 2017-10-09 NOTE — Telephone Encounter (Signed)
Okay with me 

## 2017-10-09 NOTE — Telephone Encounter (Signed)
Pt states he wants to change PCP from [facility] to [facility] at [facility]. Pt states Dr Carlis Abbott confirmed OK to switch while pt was at wife's appt today.  Please advise.

## 2017-10-09 NOTE — Telephone Encounter (Signed)
Noted  

## 2017-10-18 ENCOUNTER — Other Ambulatory Visit: Payer: Self-pay

## 2017-10-19 ENCOUNTER — Other Ambulatory Visit: Payer: Self-pay

## 2017-10-19 ENCOUNTER — Other Ambulatory Visit (INDEPENDENT_AMBULATORY_CARE_PROVIDER_SITE_OTHER): Payer: PPO

## 2017-10-19 DIAGNOSIS — R7309 Other abnormal glucose: Secondary | ICD-10-CM | POA: Diagnosis not present

## 2017-10-19 LAB — HEMOGLOBIN A1C: Hgb A1c MFr Bld: 5.7 % (ref 4.6–6.5)

## 2017-10-19 NOTE — Progress Notes (Deleted)
Hematology/Oncology Consult note [facility] [phone] [phone]   Patient Care Team: [name], MD as PCP - General (Family Medicine)  REFERRING PROVIDER:  CHIEF COMPLAINTS/PURPOSE OF CONSULTATION:    HISTORY OF PRESENTING ILLNESS:  Paul Cook is a  67 y.o.  male with PMH listed below who was referred to me for evaluation of     ROS  MEDICAL HISTORY:  Past Medical History:  Diagnosis Date  . Adenomatous colon polyp    tubular  . Adenomatous rectal polyp    tubular  . Asthma   . Depression   . Diverticulosis   . GERD (gastroesophageal reflux disease)   . Glaucoma    beginnings- no treatment yet   . History of chicken pox   . Hypertension   . Lung nodule    CT 10/2014  . Sleep apnea    wears cpap    SURGICAL HISTORY: Past Surgical History:  Procedure Laterality Date  . COLONOSCOPY    . COLONOSCOPY W/ POLYPECTOMY  2015   [facility], benign  . HERNIA REPAIR     inguinal  . POLYPECTOMY    . TONSILLECTOMY     age 72     SOCIAL HISTORY: Social History   Socioeconomic History  . Marital status: Married    Spouse name: Not on file  . Number of children: Not on file  . Years of education: Not on file  . Highest education level: Not on file  Social Needs  . Financial resource strain: Not on file  . Food insecurity - worry: Not on file  . Food insecurity - inability: Not on file  . Transportation needs - medical: Not on file  . Transportation needs - non-medical: Not on file  Occupational History  . Not on file  Tobacco Use  . Smoking status: Never Smoker  . Smokeless tobacco: Never Used  Substance and Sexual Activity  . Alcohol use: No    Alcohol/week: 0.0 oz  . Drug use: No  . Sexual activity: Not on file  Other Topics Concern  . Not on file  Social History Narrative   Lives in [place] with wife, [name]. 1 daughter in [place].      Work - Optician, dispensing, now Animal nutritionist                FAMILY HISTORY: Family History  Problem Relation Age of Onset  . Depression Mother   . Hypertension Father   . Heart disease Father   . Stroke Father   . Cancer Maternal Grandmother        ovarian?  . Cancer Paternal Grandfather        prostate  . Prostate cancer Paternal Grandfather   . Colon cancer Neg Hx   . Colon polyps Neg Hx     ALLERGIES:  is allergic to nsaids.  MEDICATIONS:  Current Outpatient Medications  Medication Sig Dispense Refill  . albuterol (PROVENTIL HFA;VENTOLIN HFA) 108 (90 BASE) MCG/ACT inhaler Inhale 2 puffs into the lungs every 6 (six) hours as needed for wheezing or shortness of breath. 1 Inhaler 0  . dicyclomine (BENTYL) 10 MG capsule Take 1 capsule (10 mg total) by mouth 2 (two) times daily. 180 capsule 1  . gabapentin (NEURONTIN) 600 MG tablet Take 1 tablet (600 mg total) by mouth 3 (three) times daily. 270 tablet 3  . lisinopril-hydrochlorothiazide (PRINZIDE,ZESTORETIC) 20-25 MG tablet Take 1 tablet by mouth daily. 90 tablet 1  . LORazepam (ATIVAN) 0.5  MG tablet Take 1-2 tablets (0.5-1 mg total) by mouth at bedtime. 60 tablet 3  . nystatin cream (MYCOSTATIN) Apply 1 application topically 2 (two) times daily. 90 g 4  . ranitidine (ZANTAC) 150 MG tablet Take 1 tablet (150 mg total) by mouth 2 (two) times daily. 180 tablet 3  . rOPINIRole (REQUIP) 2 MG tablet Take 3 tablets (6 mg total) by mouth at bedtime. 90 tablet 2  . trazodone (DESYREL) 300 MG tablet Take 1 tablet (300 mg total) by mouth at bedtime. 90 tablet 1  . zolpidem (AMBIEN) 10 MG tablet Take 1 tablet (10 mg total) by mouth at bedtime as needed. for sleep 90 tablet 1   No current facility-administered medications for this visit.      PHYSICAL EXAMINATION: ECOG PERFORMANCE STATUS: {CHL ONC ECOG [phone]} There were no vitals filed for this visit. There were no vitals filed for this visit.  Physical Exam   LABORATORY DATA:  I have reviewed the data as listed Lab Results    Component Value Date   WBC 11.2 (H) 10/02/2017   HGB 14.3 10/02/2017   HCT 43.1 10/02/2017   MCV 90.1 10/02/2017   PLT 186.0 10/02/2017   Recent Labs    10/02/17 1558  NA 137  K 4.2  CL 100  CO2 28  GLUCOSE 124*  BUN 28*  CREATININE 1.23  CALCIUM 9.4  PROT 7.0  ALBUMIN 4.3  AST 23  ALT 31  ALKPHOS 49  BILITOT 0.5       ASSESSMENT & PLAN:  1. Neutrophilia      All questions were answered. The patient knows to call the clinic with any problems questions or concerns.  Return of visit:  Thank you for this kind referral and the opportunity to participate in the care of this patient. A copy of today's note is routed to referring provider    Earlie Server, MD, PhD Hematology Oncology [facility] Pager- [phone] 10/19/2017

## 2017-10-20 ENCOUNTER — Inpatient Hospital Stay: Payer: PPO | Attending: Oncology | Admitting: Oncology

## 2017-10-27 ENCOUNTER — Other Ambulatory Visit: Payer: Self-pay | Admitting: Family Medicine

## 2017-12-20 ENCOUNTER — Telehealth: Payer: Self-pay | Admitting: Family Medicine

## 2017-12-20 NOTE — Telephone Encounter (Signed)
I am fine taking this over though I would like to what he takes this for and how frequently he takes it. He should also not be taking this and his ambien at the same time. I believe there was also a note hurting him switching providers to [facility].  Is he still planning on doing that?

## 2017-12-20 NOTE — Telephone Encounter (Signed)
Patient would like Dr. Caryl Bis taking over refills for medication.

## 2017-12-20 NOTE — Telephone Encounter (Signed)
Please advise 

## 2017-12-20 NOTE — Telephone Encounter (Signed)
Copied from [place]. Topic: Quick Communication - Rx Refill/Question >> Dec 20, 2017  2:34 PM Margot Ables wrote: Medication: lorazepam - takes prn - states he has discussed with Dr. Caryl Bis - pt has enough for about a week. Has the patient contacted their pharmacy? No. - prescribed by a different provider before Preferred Pharmacy (with phone number or street name): [facility] [place], [place] - [place] [phone] (Phone) [phone] (Fax)

## 2017-12-21 MED ORDER — LORAZEPAM 0.5 MG PO TABS: 0.5000 mg | ORAL_TABLET | Freq: Every day | ORAL | 1 refills | Status: DC

## 2017-12-21 NOTE — Telephone Encounter (Signed)
Patient states he takes it for anxiety as needed. He states he takes it 3-4 times a week. Patient states he is no longer planning on switching.

## 2017-12-21 NOTE — Telephone Encounter (Signed)
Sent to pharmacy.  Please reinforce that he should not take this when he takes Ambien.  We will need to discuss his sleep at a follow-up visit.  Thanks.

## 2017-12-21 NOTE — Telephone Encounter (Signed)
Patient notified

## 2018-01-11 ENCOUNTER — Other Ambulatory Visit: Payer: Self-pay | Admitting: Family Medicine

## 2018-01-11 NOTE — Telephone Encounter (Signed)
Copied from [place]. Topic: Quick Communication - Rx Refill/Question >> Jan 11, 2018  1:19 PM Keelan Tripodi, Doylene Canard E, NT wrote: Medication: trazodone (DESYREL) 300 MG tablet Has the patient contacted their pharmacy? yes (Agent: If no, request that the patient contact the pharmacy for the refill.) Preferred Pharmacy (with phone number or street name): [id number] - [place], [place] [phone] (Phone) [phone] (Fax)     Agent: Please be advised that RX refills may take up to 3 business days. We ask that you follow-up with your pharmacy.

## 2018-01-11 NOTE — Telephone Encounter (Signed)
LOV  10/02/17 Dr. Glennie [place]

## 2018-01-12 NOTE — Telephone Encounter (Signed)
Last OV 10/02/17 ok to fill trazodone?

## 2018-01-13 MED ORDER — TRAZODONE HCL 300 MG PO TABS: 300.0000 mg | ORAL_TABLET | Freq: Every day | ORAL | 1 refills | Status: DC

## 2018-01-26 ENCOUNTER — Telehealth: Payer: Self-pay | Admitting: Family Medicine

## 2018-01-26 DIAGNOSIS — D72829 Elevated white blood cell count, unspecified: Secondary | ICD-10-CM

## 2018-01-26 NOTE — Telephone Encounter (Signed)
LOV:10/02/17  Dr. Brayton Layman [place] [address]

## 2018-01-26 NOTE — Telephone Encounter (Signed)
Copied from [place] [id number]. Topic: Quick Communication - Rx Refill/Question >> Jan 26, 2018  5:31 PM Tye [place] wrote: Medication: gabapentin (NEURONTIN) 600 MG tablet [[id number]] 22 day supply  Has the patient contacted their pharmacy? Yes.   (Agent: If no, request that the patient contact the pharmacy for the refill.) Preferred Pharmacy (with phone number [facility] street [facility] Agent: Please be advised that RX refills may take up to 3 business days. We ask that you follow-up with your pharmacy.

## 2018-01-29 MED ORDER — GABAPENTIN 600 MG PO TABS: 600.0000 mg | ORAL_TABLET | Freq: Three times a day (TID) | ORAL | 3 refills | Status: DC

## 2018-01-29 NOTE — Telephone Encounter (Signed)
Last OV 10/02/17 and last refill 02/21/17 ok to fill?

## 2018-01-29 NOTE — Telephone Encounter (Signed)
Sent to pharmacy.  It appears the patient was previously referred to hematology though did not show up to his appointment.  This was for elevated white blood cell count.  Please see if he would like to have [place] replace the referral.  I would recommend it.

## 2018-01-30 NOTE — Telephone Encounter (Signed)
Patient said he forgot the appointment but would like another referral to hematology.

## 2018-01-30 NOTE — Telephone Encounter (Signed)
Referral placed again.  I will forward to Melissa to get this set up.

## 2018-02-05 ENCOUNTER — Telehealth: Payer: Self-pay | Admitting: Family Medicine

## 2018-02-05 ENCOUNTER — Telehealth: Payer: Self-pay

## 2018-02-05 NOTE — Telephone Encounter (Signed)
Please advise   Copied from [place] [id number]. Topic: Referral - Question >> Feb 02, 2018 11:44 AM Oliver Pila B wrote: Reason for CRM: pt called b/c he does not know why he is going for the cancer center for testing, pt was aware of checking his blood sugar but not for cancer testing, call pt to advise

## 2018-02-05 NOTE — Telephone Encounter (Signed)
Dr. Danise Mina will you except Paul Cook's husband Paul Cook as a transfer patient ?

## 2018-02-05 NOTE — Telephone Encounter (Signed)
FYI

## 2018-02-05 NOTE — Telephone Encounter (Signed)
Pt called with questions regarding results and upcoming appt with hematology. Reviewed results and reason for referral. Pt verbalizes understanding.

## 2018-02-05 NOTE — Telephone Encounter (Signed)
Copied from [place] [id number]. Topic: General - Other >> Feb 02, 2018 11:56 AM Yvette Rack wrote: Reason for CRM: pt calling wanting to be transferred from [facility]  to Dr Danise Mina at [facility] pt wife Daphine Barley is a pt of Dr Danise Mina in [facility] it says that provider wasn't accepting new pt and that the provider told his wife that it wasn't correct that he was seeing new pt please clarify

## 2018-02-05 NOTE — Telephone Encounter (Signed)
Left message to return call, ok for pec to speak to patient to notify him it was for elevated white blood cells, patient called on 01/26/18 and stated he forgot his appointment so a new referral was placed

## 2018-02-05 NOTE — Telephone Encounter (Signed)
That is fine with me. The patient is very nice.

## 2018-02-05 NOTE — Telephone Encounter (Signed)
I see wife [name]. Ok by me if ok by current PCP.

## 2018-02-05 NOTE — Telephone Encounter (Signed)
Pt returned call. Has questions regarding upcoming hematology appt. and lab values.  Reviewed results and appt with pt to his satisfaction. Verbalizes understanding.

## 2018-02-06 NOTE — Telephone Encounter (Signed)
I called the patient to make him aware .

## 2018-02-07 ENCOUNTER — Encounter: Payer: Self-pay | Admitting: Oncology

## 2018-02-21 ENCOUNTER — Other Ambulatory Visit: Payer: Self-pay

## 2018-02-21 ENCOUNTER — Encounter: Payer: Self-pay | Admitting: Oncology

## 2018-02-21 ENCOUNTER — Inpatient Hospital Stay: Payer: PPO | Attending: Oncology | Admitting: Oncology

## 2018-02-21 ENCOUNTER — Inpatient Hospital Stay: Payer: PPO

## 2018-02-21 VITALS — BP 149/83 | HR 105 | Temp 98.2°F | Resp 18 | Ht 66.0 in | Wt 289.0 lb

## 2018-02-21 DIAGNOSIS — F329 Major depressive disorder, single episode, unspecified: Secondary | ICD-10-CM | POA: Insufficient documentation

## 2018-02-21 DIAGNOSIS — D72829 Elevated white blood cell count, unspecified: Secondary | ICD-10-CM

## 2018-02-21 DIAGNOSIS — K529 Noninfective gastroenteritis and colitis, unspecified: Secondary | ICD-10-CM | POA: Insufficient documentation

## 2018-02-21 DIAGNOSIS — J45909 Unspecified asthma, uncomplicated: Secondary | ICD-10-CM | POA: Insufficient documentation

## 2018-02-21 DIAGNOSIS — K219 Gastro-esophageal reflux disease without esophagitis: Secondary | ICD-10-CM | POA: Diagnosis not present

## 2018-02-21 DIAGNOSIS — Z79899 Other long term (current) drug therapy: Secondary | ICD-10-CM | POA: Diagnosis not present

## 2018-02-21 DIAGNOSIS — R413 Other amnesia: Secondary | ICD-10-CM | POA: Insufficient documentation

## 2018-02-21 DIAGNOSIS — R918 Other nonspecific abnormal finding of lung field: Secondary | ICD-10-CM | POA: Insufficient documentation

## 2018-02-21 DIAGNOSIS — Z8042 Family history of malignant neoplasm of prostate: Secondary | ICD-10-CM | POA: Insufficient documentation

## 2018-02-21 DIAGNOSIS — Z86010 Personal history of colonic polyps: Secondary | ICD-10-CM | POA: Diagnosis not present

## 2018-02-21 DIAGNOSIS — G4730 Sleep apnea, unspecified: Secondary | ICD-10-CM | POA: Insufficient documentation

## 2018-02-21 DIAGNOSIS — I10 Essential (primary) hypertension: Secondary | ICD-10-CM | POA: Diagnosis not present

## 2018-02-21 DIAGNOSIS — Z8719 Personal history of other diseases of the digestive system: Secondary | ICD-10-CM | POA: Insufficient documentation

## 2018-02-21 LAB — CBC WITH DIFFERENTIAL/PLATELET
Basophils Absolute: 0.1 10*3/uL (ref 0–0.1)
Basophils Relative: 1 %
Eosinophils Absolute: 0.1 10*3/uL (ref 0–0.7)
Eosinophils Relative: 1 %
HCT: 45.1 % (ref 40.0–52.0)
Hemoglobin: 15.6 g/dL (ref 13.0–18.0)
Lymphocytes Relative: 18 %
Lymphs Abs: 1.7 10*3/uL (ref 1.0–3.6)
MCH: 30.2 pg (ref 26.0–34.0)
MCHC: 34.5 g/dL (ref 32.0–36.0)
MCV: 87.4 fL (ref 80.0–100.0)
Monocytes Absolute: 0.7 10*3/uL (ref 0.2–1.0)
Monocytes Relative: 8 %
Neutro Abs: 6.9 10*3/uL — ABNORMAL HIGH (ref 1.4–6.5)
Neutrophils Relative %: 72 %
Platelets: 178 10*3/uL (ref 150–440)
RBC: 5.16 MIL/uL (ref 4.40–5.90)
RDW: 14.5 % (ref 11.5–14.5)
WBC: 9.5 10*3/uL (ref 3.8–10.6)

## 2018-02-21 LAB — FOLATE: Folate: 12.6 ng/mL (ref >5.9)

## 2018-02-21 LAB — VITAMIN B12: Vitamin B-12: 592 pg/mL (ref 180–914)

## 2018-02-21 NOTE — Progress Notes (Signed)
Patient here for evaluation. Wife concerned that patient's memory is getting worse.

## 2018-02-21 NOTE — Progress Notes (Signed)
Hematology/Oncology Consult note [name] Telephone:([phone] [phone]   Patient Care Team: [name], MD as PCP - General (Family Medicine)  REFERRING PROVIDER: [name], MD CHIEF COMPLAINTS/PURPOSE OF CONSULTATION:  Evaluation of leuckocytosis  HISTORY OF PRESENTING ILLNESS:  Paul Cook is a  67 y.o.  male with PMH listed below who was referred to me for evaluation of leukoctyosis.  Patient recently had lab work done which revealed mild leukocytosis, wbc counts 11.2  Reviewed patient's previous labs, his platelet count was 13.6 in 2017, 13.1 in 2016. Patient reports having "chronic intestine inflammation". Otherwise doing well .Patient denies fatigue, weight loss, easy bruising, hematochezia, hemoptysis Denies smoking or any steroid use, recent surgery, chronic wound.    Review of Systems  Constitutional: Negative for chills, fever, malaise/fatigue and weight loss.  HENT: Negative for congestion, ear discharge, ear pain, nosebleeds, sinus pain and sore throat.   Eyes: Negative for double vision, photophobia, pain, discharge and redness.  Respiratory: Negative for cough, hemoptysis, sputum production, shortness of breath and wheezing.   Cardiovascular: Negative for chest pain, palpitations, orthopnea, claudication and leg swelling.  Gastrointestinal: Negative for abdominal pain, blood in stool, constipation, diarrhea, heartburn, melena, nausea and vomiting.  Genitourinary: Negative for dysuria, flank pain, frequency and hematuria.  Musculoskeletal: Negative for back pain, myalgias and neck pain.  Skin: Negative for itching and rash.  Neurological: Negative for dizziness, tingling, tremors, focal weakness, weakness and headaches.  Endo/Heme/Allergies: Negative for environmental allergies. Does not bruise/bleed easily.  Psychiatric/Behavioral: Positive for memory loss. Negative for depression and hallucinations. The patient is  not nervous/anxious.     MEDICAL HISTORY:  Past Medical History:  Diagnosis Date  . Adenomatous colon polyp    tubular  . Adenomatous rectal polyp    tubular  . Asthma   . Depression   . Diverticulosis   . GERD (gastroesophageal reflux disease)   . History of chicken pox   . Hypertension   . Lung nodule    CT 10/2014  . Sleep apnea    wears cpap    SURGICAL HISTORY: Past Surgical History:  Procedure Laterality Date  . COLONOSCOPY    . COLONOSCOPY W/ POLYPECTOMY  2015   [facility], benign  . HERNIA REPAIR     inguinal  . POLYPECTOMY    . TONSILLECTOMY     age 40     SOCIAL HISTORY: Social History   Socioeconomic History  . Marital status: Married    Spouse name: Not on file  . Number of children: Not on file  . Years of education: Not on file  . Highest education level: Not on file  Occupational History  . Not on file  Social Needs  . Financial resource strain: Not on file  . Food insecurity:    Worry: Not on file    Inability: Not on file  . Transportation needs:    Medical: Not on file    Non-medical: Not on file  Tobacco Use  . Smoking status: Never Smoker  . Smokeless tobacco: Never Used  Substance and Sexual Activity  . Alcohol use: No    Alcohol/week: 0.0 oz    Comment: stopped drinking 20 years ago  . Drug use: No  . Sexual activity: Not on file  Lifestyle  . Physical activity:    Days per week: Not on file    Minutes per session: Not on file  . Stress: Not on file  Relationships  . Social connections:  Talks on phone: Not on file    Gets together: Not on file    Attends religious service: Not on file    Active member of club or organization: Not on file    Attends meetings of clubs or organizations: Not on file    Relationship status: Not on file  . Intimate partner violence:    Fear of current or ex partner: Not on file    Emotionally abused: Not on file    Physically abused: Not on file    Forced sexual activity: Not on file  Other  Topics Concern  . Not on file  Social History Narrative   Lives in [place] with wife, Paul Cook. 1 daughter in [place].      Work - Optician, dispensing, now Animal nutritionist                FAMILY HISTORY: Family History  Problem Relation Age of Onset  . Depression Mother   . Hypertension Father   . Heart disease Father   . Stroke Father   . Cancer Maternal Grandmother        ovarian?  . Cancer Paternal Grandfather        prostate  . Prostate cancer Paternal Grandfather   . Colon cancer Neg Hx   . Colon polyps Neg Hx     ALLERGIES:  is allergic to nsaids.  MEDICATIONS:  Current Outpatient Medications  Medication Sig Dispense Refill  . dicyclomine (BENTYL) 10 MG capsule Take 1 capsule (10 mg total) by mouth 2 (two) times daily. 180 capsule 1  . gabapentin (NEURONTIN) 600 MG tablet Take 1 tablet (600 mg total) by mouth 3 (three) times daily. 270 tablet 3  . hyoscyamine (LEVSIN SL) 0.125 MG SL tablet Take 0.125 mg by mouth every 4 (four) hours as needed.    Marland Kitchen lisinopril-hydrochlorothiazide (PRINZIDE,ZESTORETIC) 20-25 MG tablet Take 1 tablet by mouth every day 90 tablet 0  . nystatin cream (MYCOSTATIN) Apply 1 application topically 2 (two) times daily. 90 g 4  . ranitidine (ZANTAC) 150 MG tablet Take 1 tablet (150 mg total) by mouth 2 (two) times daily. 180 tablet 3  . rOPINIRole (REQUIP) 2 MG tablet Take 3 tablets (6 mg total) by mouth at bedtime. 90 tablet 2  . trazodone (DESYREL) 300 MG tablet Take 1 tablet (300 mg total) by mouth at bedtime. 90 tablet 1  . zolpidem (AMBIEN) 10 MG tablet Take 1 tablet (10 mg total) by mouth at bedtime as needed. for sleep 90 tablet 1  . albuterol (PROVENTIL HFA;VENTOLIN HFA) 108 (90 BASE) MCG/ACT inhaler Inhale 2 puffs into the lungs every 6 (six) hours as needed for wheezing or shortness of breath. (Patient not taking: Reported on 02/21/2018) 1 Inhaler 0  . LORazepam (ATIVAN) 0.5 MG tablet Take 1-2 tablets (0.5-1 mg total) by mouth at bedtime.  Do not take with ambien. (Patient not taking: Reported on 02/21/2018) 60 tablet 1   No current facility-administered medications for this visit.      PHYSICAL EXAMINATION: ECOG PERFORMANCE STATUS: 0 - Asymptomatic Vitals:   02/21/18 1103  BP: (!) 149/83  Pulse: (!) 105  Resp: 18  Temp: 98.2 F (36.8 C)  SpO2: 94%   Filed Weights   02/21/18 1103  Weight: 289 lb (131.1 kg)    Physical Exam  Constitutional: He is oriented to person, place, and time. He appears well-developed and well-nourished. No distress.  Morbidly obese.   HENT:  Head: Normocephalic and atraumatic.  Right Ear:  External ear normal.  Left Ear: External ear normal.  Mouth/Throat: Oropharynx is clear and moist.  Eyes: Pupils are equal, round, and reactive to light. Conjunctivae and EOM are normal. No scleral icterus.  Neck: Normal range of motion. Neck supple.  Cardiovascular: Normal rate, regular rhythm and normal heart sounds.  Pulmonary/Chest: Effort normal and breath sounds normal. No respiratory distress. He has no wheezes. He has no rales. He exhibits no tenderness.  Abdominal: Soft. Bowel sounds are normal. He exhibits no distension and no mass. There is no tenderness.  Musculoskeletal: Normal range of motion. He exhibits no edema or deformity.  Lymphadenopathy:    He has no cervical adenopathy.  Neurological: He is alert and oriented to person, place, and time. No cranial nerve deficit. Coordination normal.  Skin: Skin is warm and dry. No rash noted.  Psychiatric: He has a normal mood and affect. His behavior is normal. Thought content normal.     LABORATORY DATA:  I have reviewed the data as listed Lab Results  Component Value Date   WBC 9.5 02/21/2018   HGB 15.6 02/21/2018   HCT 45.1 02/21/2018   MCV 87.4 02/21/2018   PLT 178 02/21/2018   Recent Labs    10/02/17 1558  NA 137  K 4.2  CL 100  CO2 28  GLUCOSE 124*  BUN 28*  CREATININE 1.23  CALCIUM 9.4  PROT 7.0  ALBUMIN 4.3  AST 23   ALT 31  ALKPHOS 49  BILITOT 0.5       ASSESSMENT & PLAN:  1. Leukocytosis, unspecified type   2. Memory loss    Labs were reviewed with patient. I am not overtly concerned as he only has very mild leukocytosis, predominantly neutrophilia. This can be due to chronic inflammation, infection, reactive or due to underlying bone marrow disorders.  Will repeat CBC and obtain peripheral blood flowcytometry.   # Memory loss: check Folate and B12.  All questions were answered. The patient knows to call the clinic with any problems questions or concerns.  Return of visit: 2 weeks.  Thank you for this kind referral and the opportunity to participate in the care of this patient. A copy of today's note is routed to referring provider    Earlie Server, MD, PhD Hematology Oncology [facility] at [facility] Pager- [phone] 02/21/2018

## 2018-02-23 LAB — COMP PANEL: LEUKEMIA/LYMPHOMA

## 2018-02-28 ENCOUNTER — Other Ambulatory Visit: Payer: Self-pay | Admitting: Family Medicine

## 2018-02-28 ENCOUNTER — Other Ambulatory Visit: Payer: Self-pay | Admitting: *Deleted

## 2018-02-28 MED ORDER — LISINOPRIL-HYDROCHLOROTHIAZIDE 20-25 MG PO TABS: 1.0000 | ORAL_TABLET | Freq: Every day | ORAL | 0 refills | Status: DC

## 2018-02-28 NOTE — Telephone Encounter (Unsigned)
Copied from [place] [id number]. Topic: Quick Communication - Rx Refill/Question >> Feb 28, 2018 10:42 AM Percell Belt A wrote: Medication:  lisinopril-hydrochlorothiazide (PRINZIDE,ZESTORETIC) 20-25 MG tablet [[id number]]  ranitidine (ZANTAC) 150 MG tablet [[id number]] Has the patient contacted their pharmacy? No  (Agent: If no, request that the patient contact the pharmacy for the refill.) Preferred Pharmacy (with phone number or street name): [facility]  Agent: Please be advised that RX refills may take up to 3 business days. We ask that you follow-up with your pharmacy.

## 2018-02-28 NOTE — Telephone Encounter (Signed)
Rx refill request: prinzide refilled x1 no additional. Patient is coming up due appointment follow up. Rx refill request from outside provider: Zantac 150 mg   last filled:08/29/16 # 180  LOV: 10/02/18  PCP: [place]: verified

## 2018-03-01 MED ORDER — RANITIDINE HCL 150 MG PO TABS: 150.0000 mg | ORAL_TABLET | Freq: Two times a day (BID) | ORAL | 0 refills | Status: DC

## 2018-03-01 NOTE — Telephone Encounter (Signed)
Please advise. It looks as though Dr. Lacinda Axon prescribed this back in Nov of 2017

## 2018-03-01 NOTE — Telephone Encounter (Signed)
Zantac refilled.  Plan to discuss his blood pressure medication at his follow-up.

## 2018-03-06 ENCOUNTER — Other Ambulatory Visit: Payer: Self-pay

## 2018-03-06 ENCOUNTER — Ambulatory Visit (INDEPENDENT_AMBULATORY_CARE_PROVIDER_SITE_OTHER): Payer: PPO | Admitting: Family Medicine

## 2018-03-06 ENCOUNTER — Encounter: Payer: Self-pay | Admitting: Family Medicine

## 2018-03-06 VITALS — BP 134/72 | HR 72 | Temp 98.3°F | Wt 291.4 lb

## 2018-03-06 DIAGNOSIS — G2581 Restless legs syndrome: Secondary | ICD-10-CM

## 2018-03-06 DIAGNOSIS — I10 Essential (primary) hypertension: Secondary | ICD-10-CM | POA: Diagnosis not present

## 2018-03-06 DIAGNOSIS — G4700 Insomnia, unspecified: Secondary | ICD-10-CM

## 2018-03-06 MED ORDER — AMLODIPINE BESYLATE 5 MG PO TABS: 5.0000 mg | ORAL_TABLET | Freq: Every day | ORAL | 1 refills | Status: DC

## 2018-03-06 NOTE — Progress Notes (Signed)
  Tommi Rumps, MD Phone: [phone]  Paul Cook is a 67 y.o. male who presents today for f/u.  CC: RLS, insomnia, htn  HYPERTENSION  Disease Monitoring  Home BP Monitoring 140/90 Chest pain- no    Dyspnea- no Medications  Compliance-  Taking lisinopril/hctz.  Edema- no  Restless leg syndrome: Patient has been taking Requip 8 mg nightly.  He notes with this dose he has no problems with his restless legs.  He is supposed to be on 6 mg nightly.  He also takes gabapentin 1800 mg at night.  He occasionally takes an extra 600 mg of that to help him sleep.  Insomnia: He takes trazodone and Ambien.  He occasionally takes an extra 600 mg of gabapentin to help him sleep.  He gets 8 hours most nights.  No drowsiness the next day.  He reports he has seen a sleep specialist in the past.    Social History   Tobacco Use  Smoking Status Never Smoker  Smokeless Tobacco Never Used     ROS see history of present illness  Objective  Physical Exam Vitals:   03/06/18 0942  BP: 134/72  Pulse: 72  Temp: 98.3 F (36.8 C)  SpO2: 96%    BP Readings from Last 3 Encounters:  03/06/18 134/72  02/21/18 (!) 149/83  10/02/17 140/78   Wt Readings from Last 3 Encounters:  03/06/18 291 lb 6.4 oz (132.2 kg)  02/21/18 289 lb (131.1 kg)  10/02/17 290 lb 9.6 oz (131.8 kg)    Physical Exam  Constitutional: No distress.  Cardiovascular: Normal rate, regular rhythm and normal heart sounds.  Pulmonary/Chest: Effort normal and breath sounds normal.  Musculoskeletal: He exhibits no edema.  Neurological: He is alert.  Skin: Skin is warm and dry. He is not diaphoretic.     Assessment/Plan: Please see individual problem list.  Essential hypertension, benign Above goal.  We will check lab work.  Add amlodipine 5 mg daily.  He will return in 3 weeks for BP check.  Insomnia Continues to have intermittent issues with this.  It sounds as though he takes the extra gabapentin to help him sleep.   He reports he has seen a sleep specialist previously and we will try to request those records.  If we are unable to obtain records would consider referral back to sleep specialist.  Restless legs Chronically on Requip.  He increase the dose above what we discussed previously.  He is not getting excessively drowsy.  I have asked him to decrease the dose of Requip back to 6 mg.  If this is not beneficial we will need to consider sleep specialist evaluation.  Additionally asked him not to take the extra 600 mg of gabapentin.   Health Maintenance: Discussed that his colonoscopy was not yet due.  Discussed this would be due in 2020.  Orders Placed This Encounter  Procedures  . Basic Metabolic Panel (BMET)  . Direct LDL    Meds ordered this encounter  Medications  . amLODipine (NORVASC) 5 MG tablet    Sig: Take 1 tablet (5 mg total) by mouth daily.    Dispense:  90 tablet    Refill:  1     Tommi Rumps, MD [place]

## 2018-03-06 NOTE — Addendum Note (Signed)
Addended by: Arby Barrette on: 03/06/2018 10:46 AM   Modules accepted: Orders

## 2018-03-06 NOTE — Patient Instructions (Signed)
Nice to see you. We will start you on amlodipine for your blood pressure. Please continue your current blood pressure medication. We will check some lab work today and contact you with the results. Please stop taking the extra tablet of gabapentin at night. Please decrease your Requip to 6 mg nightly.

## 2018-03-06 NOTE — Assessment & Plan Note (Signed)
Chronically on Requip.  He increase the dose above what we discussed previously.  He is not getting excessively drowsy.  I have asked him to decrease the dose of Requip back to 6 mg.  If this is not beneficial we will need to consider sleep specialist evaluation.  Additionally asked him not to take the extra 600 mg of gabapentin.

## 2018-03-06 NOTE — Assessment & Plan Note (Signed)
Above goal.  We will check lab work.  Add amlodipine 5 mg daily.  He will return in 3 weeks for BP check.

## 2018-03-06 NOTE — Assessment & Plan Note (Signed)
Continues to have intermittent issues with this.  It sounds as though he takes the extra gabapentin to help him sleep.  He reports he has seen a sleep specialist previously and we will try to request those records.  If we are unable to obtain records would consider referral back to sleep specialist.

## 2018-03-08 ENCOUNTER — Ambulatory Visit: Payer: Self-pay | Admitting: Oncology

## 2018-03-13 ENCOUNTER — Telehealth: Payer: Self-pay

## 2018-03-13 NOTE — Telephone Encounter (Signed)
Paul Cook called to get information about his appointment. I informed the patient his appointment was schedule for 03/16/18 at 10:15 AM. The patient was understanding and agreeable to come to his appointment.

## 2018-03-14 ENCOUNTER — Other Ambulatory Visit: Payer: Self-pay | Admitting: Family Medicine

## 2018-03-16 ENCOUNTER — Other Ambulatory Visit: Payer: Self-pay

## 2018-03-16 ENCOUNTER — Inpatient Hospital Stay (HOSPITAL_BASED_OUTPATIENT_CLINIC_OR_DEPARTMENT_OTHER): Payer: PPO | Admitting: Oncology

## 2018-03-16 ENCOUNTER — Encounter: Payer: Self-pay | Admitting: Oncology

## 2018-03-16 VITALS — BP 160/89 | HR 94 | Temp 97.8°F | Wt 289.0 lb

## 2018-03-16 DIAGNOSIS — K219 Gastro-esophageal reflux disease without esophagitis: Secondary | ICD-10-CM | POA: Diagnosis not present

## 2018-03-16 DIAGNOSIS — Z86010 Personal history of colonic polyps: Secondary | ICD-10-CM

## 2018-03-16 DIAGNOSIS — Z79899 Other long term (current) drug therapy: Secondary | ICD-10-CM | POA: Diagnosis not present

## 2018-03-16 DIAGNOSIS — Z8042 Family history of malignant neoplasm of prostate: Secondary | ICD-10-CM

## 2018-03-16 DIAGNOSIS — R918 Other nonspecific abnormal finding of lung field: Secondary | ICD-10-CM | POA: Diagnosis not present

## 2018-03-16 DIAGNOSIS — D72829 Elevated white blood cell count, unspecified: Secondary | ICD-10-CM | POA: Diagnosis not present

## 2018-03-16 DIAGNOSIS — K529 Noninfective gastroenteritis and colitis, unspecified: Secondary | ICD-10-CM | POA: Diagnosis not present

## 2018-03-16 DIAGNOSIS — R413 Other amnesia: Secondary | ICD-10-CM

## 2018-03-16 DIAGNOSIS — I10 Essential (primary) hypertension: Secondary | ICD-10-CM

## 2018-03-16 DIAGNOSIS — G4730 Sleep apnea, unspecified: Secondary | ICD-10-CM

## 2018-03-16 DIAGNOSIS — J45909 Unspecified asthma, uncomplicated: Secondary | ICD-10-CM | POA: Diagnosis not present

## 2018-03-16 DIAGNOSIS — F329 Major depressive disorder, single episode, unspecified: Secondary | ICD-10-CM | POA: Diagnosis not present

## 2018-03-16 DIAGNOSIS — Z8719 Personal history of other diseases of the digestive system: Secondary | ICD-10-CM

## 2018-03-16 MED ORDER — ROPINIROLE HCL 2 MG PO TABS: ORAL_TABLET | ORAL | 1 refills | Status: DC

## 2018-03-16 MED ORDER — TRAZODONE HCL 300 MG PO TABS: 300.0000 mg | ORAL_TABLET | Freq: Every day | ORAL | 1 refills | Status: DC

## 2018-03-16 MED ORDER — LISINOPRIL-HYDROCHLOROTHIAZIDE 20-25 MG PO TABS: 1.0000 | ORAL_TABLET | Freq: Every day | ORAL | 1 refills | Status: DC

## 2018-03-16 MED ORDER — RANITIDINE HCL 150 MG PO TABS: 150.0000 mg | ORAL_TABLET | Freq: Two times a day (BID) | ORAL | 1 refills | Status: DC

## 2018-03-16 MED ORDER — AMLODIPINE BESYLATE 5 MG PO TABS: 5.0000 mg | ORAL_TABLET | Freq: Every day | ORAL | 1 refills | Status: DC

## 2018-03-16 MED ORDER — GABAPENTIN 600 MG PO TABS: 600.0000 mg | ORAL_TABLET | Freq: Three times a day (TID) | ORAL | 3 refills | Status: DC

## 2018-03-16 MED ORDER — ZOLPIDEM TARTRATE 10 MG PO TABS: 10.0000 mg | ORAL_TABLET | Freq: Every evening | ORAL | 1 refills | Status: DC | PRN

## 2018-03-16 NOTE — Telephone Encounter (Signed)
Patient is switching to pill pack, last OV 03/06/18 Last filled Gabapentin 01/29/18 270 3rf Amlodipine 03/06/18 90 1rf Lorazepam 12/21/17 60 1rf Ranitidine 03/01/18 180 0rf Lisinopril 02/28/18 90 1rf Ropinirole 03/14/18 90 0rf Trazodone 01/13/18 90 1rf Ambien 07/14/17 90 1rf

## 2018-03-16 NOTE — Progress Notes (Signed)
Hematology/Oncology Consult note [facility] [phone] [phone]   Patient Care Team: [name], MD as PCP - General (Family Medicine)  REFERRING PROVIDER: [name], MD CHIEF COMPLAINTS/PURPOSE OF CONSULTATION:  Evaluation of leuckocytosis  HISTORY OF PRESENTING ILLNESS:  Paul Cook is a  67 y.o.  male with PMH listed below who was referred to me for evaluation of leukoctyosis.  Patient recently had lab work done which revealed mild leukocytosis, wbc counts 11.2  Reviewed patient's previous labs, his platelet count was 13.6 in 2017, 13.1 in 2016. Patient reports having "chronic intestine inflammation". Otherwise doing well .Patient denies fatigue, weight loss, easy bruising, hematochezia, hemoptysis Denies smoking or any steroid use, recent surgery, chronic wound.    Review of Systems  Constitutional: Negative for chills, fever, malaise/fatigue and weight loss.  HENT: Negative for congestion, ear discharge, ear pain, nosebleeds, sinus pain and sore throat.   Eyes: Negative for double vision, photophobia, pain, discharge and redness.  Respiratory: Negative for cough, hemoptysis, sputum production, shortness of breath and wheezing.   Cardiovascular: Negative for chest pain, palpitations, orthopnea, claudication and leg swelling.  Gastrointestinal: Negative for abdominal pain, blood in stool, constipation, diarrhea, heartburn, melena, nausea and vomiting.  Genitourinary: Negative for dysuria, flank pain, frequency and hematuria.  Musculoskeletal: Negative for back pain, myalgias and neck pain.  Skin: Negative for itching and rash.  Neurological: Negative for dizziness, tingling, tremors, focal weakness, weakness and headaches.  Endo/Heme/Allergies: Negative for environmental allergies. Does not bruise/bleed easily.  Psychiatric/Behavioral: Positive for memory loss. Negative for depression and hallucinations. The patient is  not nervous/anxious.     MEDICAL HISTORY:  Past Medical History:  Diagnosis Date  . Adenomatous colon polyp    tubular  . Adenomatous rectal polyp    tubular  . Asthma   . Depression   . Diverticulosis   . GERD (gastroesophageal reflux disease)   . History of chicken pox   . Hypertension   . Lung nodule    CT 10/2014  . Sleep apnea    wears cpap    SURGICAL HISTORY: Past Surgical History:  Procedure Laterality Date  . COLONOSCOPY    . COLONOSCOPY W/ POLYPECTOMY  2015   [facility], benign  . HERNIA REPAIR     inguinal  . POLYPECTOMY    . TONSILLECTOMY     age 89     SOCIAL HISTORY: Social History   Socioeconomic History  . Marital status: Married    Spouse name: Not on file  . Number of children: Not on file  . Years of education: Not on file  . Highest education level: Not on file  Occupational History  . Not on file  Social Needs  . Financial resource strain: Not on file  . Food insecurity:    Worry: Not on file    Inability: Not on file  . Transportation needs:    Medical: Not on file    Non-medical: Not on file  Tobacco Use  . Smoking status: Never Smoker  . Smokeless tobacco: Never Used  Substance and Sexual Activity  . Alcohol use: No    Alcohol/week: 0.0 oz    Comment: stopped drinking 20 years ago  . Drug use: No  . Sexual activity: Not on file  Lifestyle  . Physical activity:    Days per week: Not on file    Minutes per session: Not on file  . Stress: Not on file  Relationships  . Social connections:  Talks on phone: Not on file    Gets together: Not on file    Attends religious service: Not on file    Active member of club or organization: Not on file    Attends meetings of clubs or organizations: Not on file    Relationship status: Not on file  . Intimate partner violence:    Fear of current or ex partner: Not on file    Emotionally abused: Not on file    Physically abused: Not on file    Forced sexual activity: Not on file  Other  Topics Concern  . Not on file  Social History Narrative   Lives in [place] with wife, [name]. 1 daughter in [place].      Work - Optician, dispensing, now Animal nutritionist                FAMILY HISTORY: Family History  Problem Relation Age of Onset  . Depression Mother   . Hypertension Father   . Heart disease Father   . Stroke Father   . Cancer Maternal Grandmother        ovarian?  . Cancer Paternal Grandfather        prostate  . Prostate cancer Paternal Grandfather   . Colon cancer Neg Hx   . Colon polyps Neg Hx     ALLERGIES:  is allergic to nsaids.  MEDICATIONS:  Current Outpatient Medications  Medication Sig Dispense Refill  . albuterol (PROVENTIL HFA;VENTOLIN HFA) 108 (90 BASE) MCG/ACT inhaler Inhale 2 puffs into the lungs every 6 (six) hours as needed for wheezing or shortness of breath. 1 Inhaler 0  . dicyclomine (BENTYL) 10 MG capsule Take 1 capsule (10 mg total) by mouth 2 (two) times daily. 180 capsule 1  . hyoscyamine (LEVSIN SL) 0.125 MG SL tablet Take 0.125 mg by mouth every 4 (four) hours as needed.    Marland Kitchen LORazepam (ATIVAN) 0.5 MG tablet Take 1-2 tablets (0.5-1 mg total) by mouth at bedtime. Do not take with ambien. 60 tablet 1  . nystatin cream (MYCOSTATIN) Apply 1 application topically 2 (two) times daily. 90 g 4  . amLODipine (NORVASC) 5 MG tablet Take 1 tablet (5 mg total) by mouth daily. 90 tablet 1  . gabapentin (NEURONTIN) 600 MG tablet Take 1 tablet (600 mg total) by mouth 3 (three) times daily. 270 tablet 3  . lisinopril-hydrochlorothiazide (PRINZIDE,ZESTORETIC) 20-25 MG tablet Take 1 tablet by mouth daily. 90 tablet 1  . ranitidine (ZANTAC) 150 MG tablet Take 1 tablet (150 mg total) by mouth 2 (two) times daily. 180 tablet 1  . rOPINIRole (REQUIP) 2 MG tablet TAKE 3 TABLETS(6 MG) BY MOUTH AT BEDTIME 270 tablet 1  . trazodone (DESYREL) 300 MG tablet Take 1 tablet (300 mg total) by mouth at bedtime. 90 tablet 1  . zolpidem (AMBIEN) 10 MG tablet Take 1  tablet (10 mg total) by mouth at bedtime as needed. for sleep 90 tablet 1   No current facility-administered medications for this visit.      PHYSICAL EXAMINATION: ECOG PERFORMANCE STATUS: 0 - Asymptomatic Vitals:   03/16/18 1228  BP: (!) 160/89  Pulse: 94  Temp: 97.8 F (36.6 C)   Filed Weights   03/16/18 1228  Weight: 289 lb (131.1 kg)    Physical Exam  Constitutional: He is oriented to person, place, and time. He appears well-developed and well-nourished. No distress.  Morbidly obese.   HENT:  Head: Normocephalic and atraumatic.  Right Ear: External ear normal.  Left Ear: External ear normal.  Mouth/Throat: Oropharynx is clear and moist.  Eyes: Pupils are equal, round, and reactive to light. Conjunctivae and EOM are normal. No scleral icterus.  Neck: Normal range of motion. Neck supple.  Cardiovascular: Normal rate, regular rhythm and normal heart sounds.  Pulmonary/Chest: Effort normal and breath sounds normal. No respiratory distress. He has no wheezes. He has no rales. He exhibits no tenderness.  Abdominal: Soft. Bowel sounds are normal. He exhibits no distension and no mass. There is no tenderness.  Musculoskeletal: Normal range of motion. He exhibits no edema or deformity.  Lymphadenopathy:    He has no cervical adenopathy.  Neurological: He is alert and oriented to person, place, and time. No cranial nerve deficit. Coordination normal.  Skin: Skin is warm and dry. No rash noted.  Psychiatric: He has a normal mood and affect. His behavior is normal. Thought content normal.     LABORATORY DATA:  I have reviewed the data as listed Lab Results  Component Value Date   WBC 9.5 02/21/2018   HGB 15.6 02/21/2018   HCT 45.1 02/21/2018   MCV 87.4 02/21/2018   PLT 178 02/21/2018   Recent Labs    10/02/17 1558  NA 137  K 4.2  CL 100  CO2 28  GLUCOSE 124*  BUN 28*  CREATININE 1.23  CALCIUM 9.4  PROT 7.0  ALBUMIN 4.3  AST 23  ALT 31  ALKPHOS 49  BILITOT  0.5       ASSESSMENT & PLAN:  1. Leukocytosis, unspecified type   2. Memory loss   Repeat CBC showed normal total WBC and only slightly high neutrophils.  Peripheral blood flowcytometry also negative.  The mild neutrophilia is likely reactive. Recommend monitoring counts twice a year. Patient voices understanding. .    # Memory loss: normal Folate and B12.  All questions were answered. The patient knows to call the clinic with any problems questions or concerns.  Return of visit:6 months.  Thank you for this kind referral and the opportunity to participate in the care of this patient. A copy of today's note is routed to referring provider    Earlie Server, MD, PhD Hematology Oncology [facility] at [facility] Pager- [phone] 03/16/2018

## 2018-03-16 NOTE — Progress Notes (Signed)
Patient here today for follow up.   

## 2018-03-16 NOTE — Telephone Encounter (Signed)
Refill sent to pharmacy with the exception of the Lorazepam.  Please contact the patient and see what he is taking the Lorazepam for.  Please find out how often he is taking this as well.  Please make sure he is not taking this when he takes his Ambien.  Thanks.

## 2018-03-20 MED ORDER — LORAZEPAM 0.5 MG PO TABS: 0.5000 mg | ORAL_TABLET | Freq: Every day | ORAL | 1 refills | Status: DC

## 2018-03-20 NOTE — Telephone Encounter (Signed)
Patient takes lorazepam for anxiety as needed. He takes this about 3 times a week. Patient does not take this with Ambien.

## 2018-03-20 NOTE — Telephone Encounter (Signed)
Lorazepam refill sent to pharmacy.

## 2018-03-28 ENCOUNTER — Ambulatory Visit: Payer: Self-pay

## 2018-03-29 ENCOUNTER — Ambulatory Visit (INDEPENDENT_AMBULATORY_CARE_PROVIDER_SITE_OTHER): Payer: PPO | Admitting: *Deleted

## 2018-03-29 ENCOUNTER — Ambulatory Visit: Payer: Self-pay

## 2018-03-29 VITALS — BP 128/80 | HR 64 | Resp 18

## 2018-03-29 DIAGNOSIS — I10 Essential (primary) hypertension: Secondary | ICD-10-CM

## 2018-03-29 NOTE — Progress Notes (Addendum)
Patient came in for follow up 3 weeks after starting amlodipine 5 mg patient BP in left arm 128/80 pulse 64.  Reviewed.  Continue current medication regimen.    Dr Nicki Reaper

## 2018-03-30 NOTE — Progress Notes (Signed)
Blood pressure is now well controlled.  He should continue amlodipine 5 mg.  He should have follow-up at 3 months.

## 2018-03-30 NOTE — Progress Notes (Signed)
Patient notified and voiced understanding.  Appointment scheduled.

## 2018-04-03 ENCOUNTER — Telehealth: Payer: Self-pay

## 2018-04-03 ENCOUNTER — Telehealth: Payer: Self-pay | Admitting: Family Medicine

## 2018-04-03 ENCOUNTER — Ambulatory Visit: Payer: Self-pay | Admitting: Family Medicine

## 2018-04-03 NOTE — Telephone Encounter (Signed)
Per visit 03/06/18- patient is only supposed to be taking 3 pills at bedtime. Patient has increased to 10 mg at night- he states decreasing would not help him sleep. Patient states his legs tremble and he may need something else. Patient is using Ambien for sleep- which works if he is not trembling. He will need new Rx tomorrow because he will be out of medication. He states he does not have to switch doctors- he was upset this morning. Told patient his provider would review his request and let him know what to do about his medications- it may not be safe for him to be increasing that particular medication.

## 2018-04-03 NOTE — Telephone Encounter (Signed)
Ok to change

## 2018-04-03 NOTE — Telephone Encounter (Signed)
Ok to change  [facility]

## 2018-04-03 NOTE — Telephone Encounter (Signed)
Copied from [place] [id number]. Topic: General - Call Back - No Documentation >> Apr 03, 2018  1:25 PM Selinda Flavin B, NT wrote: Reason for CRM: Patient returning a phone call. Unsure what the call was about. No documentation. Please advise.

## 2018-04-03 NOTE — Telephone Encounter (Signed)
I am fine with this. He is very nice. I'm not sure what has prompted this request. It could be related to me advising him to decrease his requip dose after he increased it on his own.

## 2018-04-03 NOTE — Telephone Encounter (Signed)
Copied from [place] [phone]. Topic: Quick Communication - Rx Refill/Question >> Apr 03, 2018  8:34 AM Paul Cook wrote: Medication:   rOPINIRole (REQUIP) 2 MG tablet Pt. Stated the doctor increased this to 4 pills at bedtime.   He is almost out and wants sent to [facility]  Has the patient contacted their pharmacy? No. (Agent: If no, request that the patient contact the pharmacy for the refill.) (Agent: If yes, when and what did the pharmacy advise?)  Preferred Pharmacy (with phone number or street name):   [facility] [place], [place] - [address] [place] [address] Phone: [phone] Fax: [phone]    Agent: Please be advised that RX refills may take up to 3 business days. We ask that you follow-up with your pharmacy.

## 2018-04-03 NOTE — Telephone Encounter (Signed)
Copied from [place] [phone]. Topic: Appointment Scheduling - Scheduling Inquiry for Clinic >> Apr 03, 2018 11:31 AM Mylinda Latina, NT wrote: Reason for CRM:  Patient called and is requesting a transfer of Care from Dr. Caryl Bis to Dr. Aundra Dubin . Patient states it is due to medicine and his Health. Please call patient when approved by Dr. Aundra Dubin  to make an appt. CB# [phone]

## 2018-04-03 NOTE — Telephone Encounter (Signed)
I need to know from Dr. Caryl Bis why he would want to switch?  And if Dr. Caryl Bis is okay with this?    [place]

## 2018-04-03 NOTE — Telephone Encounter (Signed)
Patient spoke with Juliann Pulse on 03/30/18

## 2018-04-04 ENCOUNTER — Telehealth: Payer: Self-pay | Admitting: Family Medicine

## 2018-04-04 ENCOUNTER — Ambulatory Visit (INDEPENDENT_AMBULATORY_CARE_PROVIDER_SITE_OTHER): Payer: PPO

## 2018-04-04 VITALS — BP 132/72 | HR 71 | Temp 98.3°F | Resp 16 | Ht 66.0 in | Wt 287.8 lb

## 2018-04-04 DIAGNOSIS — Z23 Encounter for immunization: Secondary | ICD-10-CM | POA: Diagnosis not present

## 2018-04-04 DIAGNOSIS — Z0000 Encounter for general adult medical examination without abnormal findings: Secondary | ICD-10-CM

## 2018-04-04 MED ORDER — ROPINIROLE HCL 2 MG PO TABS: ORAL_TABLET | ORAL | 1 refills | Status: DC

## 2018-04-04 NOTE — Telephone Encounter (Signed)
I have discussed this with him previously. He is taking more than the recommended dose of this medication. He should be taking 3 tablets of requip nightly. If this is not beneficial we will need to refer him to neurology for a sleep evaluation. I am happy to refer him. I have sent a refill to his local pharmacy.

## 2018-04-04 NOTE — Telephone Encounter (Signed)
-----   Message from Dia Crawford, LPN sent at 8/36/7255  1:43 PM EDT ----- Regarding: REFERRAL AND/OR RX REFILL Dr. Caryl Bis- pt states he is taking 4-5 tabs of requip at bedtime EACH NIGHT for restless legs and would like a refill.  Rx is written for 3 tabs daily. I encouraged him to take as written and follow up.  States he has 5 pills left.  He transfers care to Dr. [name] in August.  How would you like to handle this?   Thanks, Denisa

## 2018-04-04 NOTE — Progress Notes (Signed)
Subjective:   Paul Cook is a 67 y.o. male who presents for an Initial Medicare Annual Wellness Visit.  Review of Systems  No ROS.  Medicare Wellness Visit. Additional risk factors are reflected in the social history.  Cardiac Risk Factors include: advanced age (>84men, >35 women);male gender;hypertension;obesity (BMI >30kg/m2)    Objective:    Today's Vitals   04/04/18 1015  BP: 132/72  Pulse: 71  Resp: 16  Temp: 98.3 F (36.8 C)  TempSrc: Oral  SpO2: 96%  Weight: 287 lb 12.8 oz (130.5 kg)  Height: 5\' 6"  (1.676 m)   Body mass index is 46.45 kg/m.  Advanced Directives 04/04/2018 03/16/2018 02/21/2018 03/30/2017 11/07/2016 02/29/2016  Does Patient Have a Medical Advance Directive? No No No No No No  Would patient like information on creating a medical advance directive? Yes (MAU/Ambulatory/Procedural Areas - Information given) No - Patient declined - No - Patient declined Yes (MAU/Ambulatory/Procedural Areas - Information given) -    Current Medications (verified) Outpatient Encounter Medications as of 04/04/2018  Medication Sig  . albuterol (PROVENTIL HFA;VENTOLIN HFA) 108 (90 BASE) MCG/ACT inhaler Inhale 2 puffs into the lungs every 6 (six) hours as needed for wheezing or shortness of breath.  Marland Kitchen amLODipine (NORVASC) 5 MG tablet Take 1 tablet (5 mg total) by mouth daily.  Marland Kitchen dicyclomine (BENTYL) 10 MG capsule Take 1 capsule (10 mg total) by mouth 2 (two) times daily.  Marland Kitchen gabapentin (NEURONTIN) 600 MG tablet Take 1 tablet (600 mg total) by mouth 3 (three) times daily.  . hyoscyamine (LEVSIN SL) 0.125 MG SL tablet Take 0.125 mg by mouth every 4 (four) hours as needed.  Marland Kitchen lisinopril-hydrochlorothiazide (PRINZIDE,ZESTORETIC) 20-25 MG tablet Take 1 tablet by mouth daily.  Marland Kitchen LORazepam (ATIVAN) 0.5 MG tablet Take 1-2 tablets (0.5-1 mg total) by mouth at bedtime. Do not take with ambien.  . nystatin cream (MYCOSTATIN) Apply 1 application topically 2 (two) times daily.  . ranitidine  (ZANTAC) 150 MG tablet Take 1 tablet (150 mg total) by mouth 2 (two) times daily.  Marland Kitchen rOPINIRole (REQUIP) 2 MG tablet TAKE 3 TABLETS(6 MG) BY MOUTH AT BEDTIME  . trazodone (DESYREL) 300 MG tablet Take 1 tablet (300 mg total) by mouth at bedtime.  Marland Kitchen zolpidem (AMBIEN) 10 MG tablet Take 1 tablet (10 mg total) by mouth at bedtime as needed. for sleep   No facility-administered encounter medications on file as of 04/04/2018.     Allergies (verified) Nsaids   History: Past Medical History:  Diagnosis Date  . Adenomatous colon polyp    tubular  . Adenomatous rectal polyp    tubular  . Asthma   . Depression   . Diverticulosis   . GERD (gastroesophageal reflux disease)   . History of chicken pox   . Hypertension   . Lung nodule    CT 10/2014  . Sleep apnea    wears cpap   Past Surgical History:  Procedure Laterality Date  . COLONOSCOPY    . COLONOSCOPY W/ POLYPECTOMY  2015   [facility], benign  . HERNIA REPAIR     inguinal  . POLYPECTOMY    . TONSILLECTOMY     age 45    Family History  Problem Relation Age of Onset  . Depression Mother   . Hypertension Father   . Heart disease Father   . Stroke Father   . Cancer Maternal Grandmother        ovarian?  . Cancer Paternal Grandfather  prostate  . Prostate cancer Paternal Grandfather   . Colon cancer Neg Hx   . Colon polyps Neg Hx    Social History   Socioeconomic History  . Marital status: Married    Spouse name: Not on file  . Number of children: Not on file  . Years of education: Not on file  . Highest education level: Not on file  Occupational History  . Not on file  Social Needs  . Financial resource strain: Not hard at all  . Food insecurity:    Worry: Never true    Inability: Never true  . Transportation needs:    Medical: No    Non-medical: No  Tobacco Use  . Smoking status: Never Smoker  . Smokeless tobacco: Never Used  Substance and Sexual Activity  . Alcohol use: No    Alcohol/week: 0.0 oz     Comment: stopped drinking 20 years ago  . Drug use: No  . Sexual activity: Never  Lifestyle  . Physical activity:    Days per week: Not on file    Minutes per session: Not on file  . Stress: Not at all  Relationships  . Social connections:    Talks on phone: Not on file    Gets together: Not on file    Attends religious service: Not on file    Active member of club or organization: Not on file    Attends meetings of clubs or organizations: Not on file    Relationship status: Not on file  Other Topics Concern  . Not on file  Social History Narrative   Lives in [place] with wife, Paul Cook. 1 daughter in [place].      Work - Optician, dispensing, now Animal nutritionist               Tobacco Counseling Counseling given: Not Answered   Clinical Intake:  Pre-visit preparation completed: Yes  Pain : No/denies pain     Nutritional Status: BMI > 30  Obese Diabetes: No  How often do you need to have someone help you when you read instructions, pamphlets, or other written materials from your doctor or pharmacy?: 1 - Never  Interpreter Needed?: No     Activities of Daily Living In your present state of health, do you have any difficulty performing the following activities: 04/04/2018  Hearing? N  Vision? N  Difficulty concentrating or making decisions? N  Walking or climbing stairs? N  Dressing or bathing? N  Doing errands, shopping? N  Preparing Food and eating ? N  Using the Toilet? N  In the past six months, have you accidently leaked urine? N  Do you have problems with loss of bowel control? N  Managing your Medications? N  Managing your Finances? N  Housekeeping or managing your Housekeeping? N  Some recent data might be hidden     Immunizations and Health Maintenance Immunization History  Administered Date(s) Administered  . Influenza Whole 06/19/2013  . Influenza,inj,Quad PF,6+ Mos 07/15/2014, 07/18/2016  . Influenza-Unspecified 06/18/2015  . Pneumococcal  Conjugate-13 03/30/2017  . Pneumococcal Polysaccharide-23 04/04/2018  . Tdap 04/17/2014  . Zoster 12/18/2013   There are no preventive care reminders to display for this patient.  Patient Care Team: McLean-Scocuzza, [name], MD as PCP - General (Internal Medicine)  Indicate any recent Medical Services you may have received from other than [facility] providers in the past year (date may be approximate).    Assessment:   This is a routine wellness  examination for Paul Cook.  The goal of the wellness visit is to assist the patient how to close the gaps in care and create a preventative care plan for the patient.   The roster of all physicians providing medical care to patient is listed in the Snapshot section of the chart.  Osteoporosis risk reviewed.    Safety issues reviewed; Smoke and carbon monoxide detectors in the home. No firearms in the home. Wears seatbelts when driving or riding with others. No violence in the home.  They do not have excessive sun exposure.  Discussed the need for sun protection: hats, long sleeves and the use of sunscreen if there is significant sun exposure.  Patient is alert, normal appearance, oriented to person/place/and time. Correctly identified the president of the [place] and recalls of 3/3 words.  Performs simple calculations and can read correct time from watch face. Displays appropriate judgement.  No new identified risk were noted.  No failures at ADL's or IADL's.    BMI- discussed the importance of a healthy diet, water intake and the benefits of aerobic exercise. Educational material provided.   24 hour diet recall: Keto diet  Dental- every 6 months.  Eye- Visual acuity not assessed per patient preference since they have regular follow up with the ophthalmologist.  Wears corrective lenses when driving or reading.   Sleep patterns- Sleeps 6-8 hours at night.  Wakes feeling rested. CPAP in use.  Taking medication as prescribed.   Pneumovax 23  administered L deltoid, tolerated well. Educational material provided.  Health maintenance gaps- closed.  Patient Concerns: Requests refill on Requip.  Written for 3 tabs daily, but he takes 4-5 tabs for restless legs.  I encouraged him to take as prescribed. He has an appointment to establish care with Dr.TMS scheduled.  Deferred to pcp for follow up.    Hearing/Vision screen Hearing Screening Comments: Patient is able to hear conversational tones without difficulty.  No issues reported.   Vision Screening Comments: Wears corrective lenses when reading or driving Last OV 20/9470 Visual acuity not assessed per patient preference since they have regular follow up with the ophthalmologist  Dietary issues and exercise activities discussed: Current Exercise Habits: The patient does not participate in regular exercise at present  Goals    . Lose weight      Healthy diet Water aerobics, walking, aerobic exercise 5 days, 30 minutes (150 minutes) Stay hydrated       Depression Screen PHQ 2/9 Scores 04/04/2018 03/29/2017 03/02/2017 01/30/2017  PHQ - 2 Score 0 0 0 0    Fall Risk Fall Risk  04/04/2018 03/29/2017 03/02/2017 01/30/2017 12/28/2016  Falls in the past year? No No No No No   Cognitive Function: MMSE - Mini Mental State Exam 04/04/2018  Orientation to time 5  Orientation to Place 5  Registration 3  Attention/ Calculation 5  Recall 3  Language- name 2 objects 2  Language- repeat 1  Language- follow 3 step command 3  Language- read & follow direction 1  Write a sentence 1  Copy design 1  Total score 30        Screening Tests Health Maintenance  Topic Date Due  . INFLUENZA VACCINE  05/17/2018  . COLONOSCOPY  04/12/2019  . TETANUS/TDAP  04/17/2024  . PNA vac Low Risk Adult  Completed      Plan:    End of life planning; Advance aging; Advanced directives discussed. Copy of current HCPOA/Living Will requested upon completion.    I  have personally reviewed and noted the  following in the patient's chart:   . Medical and social history . Use of alcohol, tobacco or illicit drugs  . Current medications and supplements . Functional ability and status . Nutritional status . Physical activity . Advanced directives . List of other physicians . Hospitalizations, surgeries, and ER visits in previous 12 months . Vitals . Screenings to include cognitive, depression, and falls . Referrals and appointments  In addition, I have reviewed and discussed with patient certain preventive protocols, quality metrics, and best practice recommendations. A written personalized care plan for preventive services as well as general preventive health recommendations were provided to patient.     Varney Biles, LPN   5/90/9311

## 2018-04-04 NOTE — Patient Instructions (Addendum)
  Paul Cook , Thank you for taking time to come for your Medicare Wellness Visit. I appreciate your ongoing commitment to your health goals. Please review the following plan we discussed and let me know if I can assist you in the future.   Follow up as needed.    Bring a copy of your [place] and/or Living Will to be scanned into chart once completed.   Have a great day!  These are the goals we discussed: Goals    . Lose weight      Healthy diet Water aerobics, walking, aerobic exercise 5 days, 30 minutes (150 minutes) Stay hydrated        This is a list of the screening recommended for you and due dates:  Health Maintenance  Topic Date Due  . Flu Shot  05/17/2018  . Colon Cancer Screening  04/12/2019  . Tetanus Vaccine  04/17/2024  . Pneumonia vaccines  Completed

## 2018-04-04 NOTE — Telephone Encounter (Signed)
FYI

## 2018-04-05 ENCOUNTER — Telehealth: Payer: Self-pay | Admitting: Family Medicine

## 2018-04-05 DIAGNOSIS — G2581 Restless legs syndrome: Secondary | ICD-10-CM

## 2018-04-05 NOTE — Telephone Encounter (Signed)
-----   Message from Dia Crawford, LPN sent at 0/02/1101  8:59 AM EDT ----- Regarding: RE: REFERRAL AND/OR RX REFILL Noted.  Patient informed and consents to neurology referral.   ----- Message ----- From: [name], MD Sent: 04/04/2018   8:06 PM To: Denisa L O'Brien-Blaney, LPN Subject: RE: REFERRAL AND/OR RX REFILL                  Please see the phone note I sent regarding this. Thanks. Randall Hiss.  ----- Message ----- From: Dia Crawford, LPN Sent: 10/27/7354   1:43 PM To: [name], MD Subject: REFERRAL AND/OR RX REFILL                      Dr. Caryl Bis- pt states he is taking 4-5 tabs of requip at bedtime [facility] NIGHT for restless legs and would like a refill.  Rx is written for 3 tabs daily. I encouraged him to take as written and follow up.  States he has 5 pills left.  He transfers care to Dr. [name] in August.  How would you like to handle this?   Thanks, Denisa

## 2018-04-05 NOTE — Telephone Encounter (Signed)
Neurology referral placed

## 2018-04-11 ENCOUNTER — Other Ambulatory Visit: Payer: Self-pay | Admitting: Family Medicine

## 2018-04-11 NOTE — Telephone Encounter (Signed)
Last Ov 03/06/18 with Dr.Sonnenberg last filled  Trazodone 03/16/18 90 1rf  lisinopril 02/16/18 90 1rf Requip 04/04/18 270 1rf Patient is transferring to [place]

## 2018-04-12 ENCOUNTER — Other Ambulatory Visit: Payer: Self-pay | Admitting: Family Medicine

## 2018-04-12 DIAGNOSIS — M546 Pain in thoracic spine: Secondary | ICD-10-CM | POA: Diagnosis not present

## 2018-04-12 DIAGNOSIS — M5136 Other intervertebral disc degeneration, lumbar region: Secondary | ICD-10-CM | POA: Diagnosis not present

## 2018-04-12 DIAGNOSIS — M9902 Segmental and somatic dysfunction of thoracic region: Secondary | ICD-10-CM | POA: Diagnosis not present

## 2018-04-12 DIAGNOSIS — M9903 Segmental and somatic dysfunction of lumbar region: Secondary | ICD-10-CM | POA: Diagnosis not present

## 2018-04-12 NOTE — Telephone Encounter (Signed)
Last Ov 03/06/18 last filled by Dr.Sonnenberg 03/20/18 60 1rf

## 2018-04-13 DIAGNOSIS — M9902 Segmental and somatic dysfunction of thoracic region: Secondary | ICD-10-CM | POA: Diagnosis not present

## 2018-04-13 DIAGNOSIS — M5136 Other intervertebral disc degeneration, lumbar region: Secondary | ICD-10-CM | POA: Diagnosis not present

## 2018-04-13 DIAGNOSIS — M546 Pain in thoracic spine: Secondary | ICD-10-CM | POA: Diagnosis not present

## 2018-04-13 DIAGNOSIS — M9903 Segmental and somatic dysfunction of lumbar region: Secondary | ICD-10-CM | POA: Diagnosis not present

## 2018-04-17 DIAGNOSIS — M9902 Segmental and somatic dysfunction of thoracic region: Secondary | ICD-10-CM | POA: Diagnosis not present

## 2018-04-17 DIAGNOSIS — M546 Pain in thoracic spine: Secondary | ICD-10-CM | POA: Diagnosis not present

## 2018-04-17 DIAGNOSIS — M5136 Other intervertebral disc degeneration, lumbar region: Secondary | ICD-10-CM | POA: Diagnosis not present

## 2018-04-17 DIAGNOSIS — M9903 Segmental and somatic dysfunction of lumbar region: Secondary | ICD-10-CM | POA: Diagnosis not present

## 2018-04-18 ENCOUNTER — Other Ambulatory Visit: Payer: Self-pay | Admitting: Internal Medicine

## 2018-04-18 DIAGNOSIS — G4700 Insomnia, unspecified: Secondary | ICD-10-CM

## 2018-04-18 MED ORDER — ZOLPIDEM TARTRATE 10 MG PO TABS: 10.0000 mg | ORAL_TABLET | Freq: Every evening | ORAL | 1 refills | Status: DC | PRN

## 2018-05-02 DIAGNOSIS — M9903 Segmental and somatic dysfunction of lumbar region: Secondary | ICD-10-CM | POA: Diagnosis not present

## 2018-05-02 DIAGNOSIS — M9902 Segmental and somatic dysfunction of thoracic region: Secondary | ICD-10-CM | POA: Diagnosis not present

## 2018-05-02 DIAGNOSIS — M546 Pain in thoracic spine: Secondary | ICD-10-CM | POA: Diagnosis not present

## 2018-05-02 DIAGNOSIS — M5136 Other intervertebral disc degeneration, lumbar region: Secondary | ICD-10-CM | POA: Diagnosis not present

## 2018-05-04 DIAGNOSIS — M546 Pain in thoracic spine: Secondary | ICD-10-CM | POA: Diagnosis not present

## 2018-05-04 DIAGNOSIS — M9903 Segmental and somatic dysfunction of lumbar region: Secondary | ICD-10-CM | POA: Diagnosis not present

## 2018-05-04 DIAGNOSIS — M9902 Segmental and somatic dysfunction of thoracic region: Secondary | ICD-10-CM | POA: Diagnosis not present

## 2018-05-04 DIAGNOSIS — M5136 Other intervertebral disc degeneration, lumbar region: Secondary | ICD-10-CM | POA: Diagnosis not present

## 2018-05-17 DIAGNOSIS — M9903 Segmental and somatic dysfunction of lumbar region: Secondary | ICD-10-CM | POA: Diagnosis not present

## 2018-05-17 DIAGNOSIS — M9902 Segmental and somatic dysfunction of thoracic region: Secondary | ICD-10-CM | POA: Diagnosis not present

## 2018-05-17 DIAGNOSIS — M5136 Other intervertebral disc degeneration, lumbar region: Secondary | ICD-10-CM | POA: Diagnosis not present

## 2018-05-17 DIAGNOSIS — M546 Pain in thoracic spine: Secondary | ICD-10-CM | POA: Diagnosis not present

## 2018-05-24 DIAGNOSIS — M5136 Other intervertebral disc degeneration, lumbar region: Secondary | ICD-10-CM | POA: Diagnosis not present

## 2018-05-24 DIAGNOSIS — M9903 Segmental and somatic dysfunction of lumbar region: Secondary | ICD-10-CM | POA: Diagnosis not present

## 2018-05-24 DIAGNOSIS — M546 Pain in thoracic spine: Secondary | ICD-10-CM | POA: Diagnosis not present

## 2018-05-24 DIAGNOSIS — M9902 Segmental and somatic dysfunction of thoracic region: Secondary | ICD-10-CM | POA: Diagnosis not present

## 2018-05-28 DIAGNOSIS — M5136 Other intervertebral disc degeneration, lumbar region: Secondary | ICD-10-CM | POA: Diagnosis not present

## 2018-05-28 DIAGNOSIS — M9902 Segmental and somatic dysfunction of thoracic region: Secondary | ICD-10-CM | POA: Diagnosis not present

## 2018-05-28 DIAGNOSIS — M546 Pain in thoracic spine: Secondary | ICD-10-CM | POA: Diagnosis not present

## 2018-05-28 DIAGNOSIS — M9903 Segmental and somatic dysfunction of lumbar region: Secondary | ICD-10-CM | POA: Diagnosis not present

## 2018-06-06 ENCOUNTER — Ambulatory Visit: Payer: Self-pay | Admitting: Family Medicine

## 2018-06-12 ENCOUNTER — Telehealth: Payer: Self-pay | Admitting: Internal Medicine

## 2018-06-12 NOTE — Telephone Encounter (Signed)
Patient has seen Dr Caryl Bis in the past. Please advise.

## 2018-06-12 NOTE — Telephone Encounter (Signed)
Pt states he would like a referral to see a psychiatrist. Pt was scheduled to come in on 06/14/18 but needed to resch appt I resch pt to 06/28/18. Pt has not yet seen Dr Aundra Dubin as of yet. Please advise?  Call pt @ [phone]. Thank you!

## 2018-06-13 ENCOUNTER — Other Ambulatory Visit: Payer: Self-pay | Admitting: Internal Medicine

## 2018-06-13 DIAGNOSIS — F419 Anxiety disorder, unspecified: Secondary | ICD-10-CM

## 2018-06-13 DIAGNOSIS — F32A Depression, unspecified: Secondary | ICD-10-CM

## 2018-06-13 DIAGNOSIS — G4700 Insomnia, unspecified: Secondary | ICD-10-CM

## 2018-06-13 DIAGNOSIS — F329 Major depressive disorder, single episode, unspecified: Secondary | ICD-10-CM

## 2018-06-13 NOTE — Telephone Encounter (Signed)
Refer to [facility] vs Dr. Nicolasa Ducking for anxiety/depression and insomnia  [place]

## 2018-06-14 ENCOUNTER — Ambulatory Visit: Payer: Self-pay | Admitting: Internal Medicine

## 2018-06-28 ENCOUNTER — Encounter: Payer: Self-pay | Admitting: Internal Medicine

## 2018-06-28 ENCOUNTER — Ambulatory Visit (INDEPENDENT_AMBULATORY_CARE_PROVIDER_SITE_OTHER): Payer: PPO | Admitting: Internal Medicine

## 2018-06-28 VITALS — BP 156/80 | HR 75 | Temp 98.3°F | Ht 66.0 in | Wt 290.8 lb

## 2018-06-28 DIAGNOSIS — Z1329 Encounter for screening for other suspected endocrine disorder: Secondary | ICD-10-CM

## 2018-06-28 DIAGNOSIS — Z630 Problems in relationship with spouse or partner: Secondary | ICD-10-CM | POA: Diagnosis not present

## 2018-06-28 DIAGNOSIS — Z125 Encounter for screening for malignant neoplasm of prostate: Secondary | ICD-10-CM

## 2018-06-28 DIAGNOSIS — R7303 Prediabetes: Secondary | ICD-10-CM | POA: Diagnosis not present

## 2018-06-28 DIAGNOSIS — E559 Vitamin D deficiency, unspecified: Secondary | ICD-10-CM

## 2018-06-28 DIAGNOSIS — Z13818 Encounter for screening for other digestive system disorders: Secondary | ICD-10-CM

## 2018-06-28 DIAGNOSIS — F419 Anxiety disorder, unspecified: Secondary | ICD-10-CM

## 2018-06-28 DIAGNOSIS — I10 Essential (primary) hypertension: Secondary | ICD-10-CM | POA: Diagnosis not present

## 2018-06-28 DIAGNOSIS — R319 Hematuria, unspecified: Secondary | ICD-10-CM | POA: Diagnosis not present

## 2018-06-28 DIAGNOSIS — Z23 Encounter for immunization: Secondary | ICD-10-CM | POA: Diagnosis not present

## 2018-06-28 DIAGNOSIS — Z6841 Body Mass Index (BMI) 40.0 and over, adult: Secondary | ICD-10-CM

## 2018-06-28 DIAGNOSIS — F32A Depression, unspecified: Secondary | ICD-10-CM

## 2018-06-28 DIAGNOSIS — G4700 Insomnia, unspecified: Secondary | ICD-10-CM | POA: Diagnosis not present

## 2018-06-28 DIAGNOSIS — Z1159 Encounter for screening for other viral diseases: Secondary | ICD-10-CM | POA: Diagnosis not present

## 2018-06-28 DIAGNOSIS — F329 Major depressive disorder, single episode, unspecified: Secondary | ICD-10-CM | POA: Diagnosis not present

## 2018-06-28 DIAGNOSIS — G2581 Restless legs syndrome: Secondary | ICD-10-CM

## 2018-06-28 DIAGNOSIS — G4733 Obstructive sleep apnea (adult) (pediatric): Secondary | ICD-10-CM

## 2018-06-28 DIAGNOSIS — Z0184 Encounter for antibody response examination: Secondary | ICD-10-CM | POA: Diagnosis not present

## 2018-06-28 LAB — COMPREHENSIVE METABOLIC PANEL
ALT: 38 U/L (ref 0–53)
AST: 23 U/L (ref 0–37)
Albumin: 4.5 g/dL (ref 3.5–5.2)
Alkaline Phosphatase: 56 U/L (ref 39–117)
BUN: 21 mg/dL (ref 6–23)
CO2: 32 mEq/L (ref 19–32)
Calcium: 9.7 mg/dL (ref 8.4–10.5)
Chloride: 98 mEq/L (ref 96–112)
Creatinine, Ser: 0.99 mg/dL (ref 0.40–1.50)
GFR: 80.19 mL/min (ref >60.00)
Glucose, Bld: 95 mg/dL (ref 70–99)
Potassium: 3.8 mEq/L (ref 3.5–5.1)
Sodium: 140 mEq/L (ref 135–145)
Total Bilirubin: 0.5 mg/dL (ref 0.2–1.2)
Total Protein: 6.9 g/dL (ref 6.0–8.3)

## 2018-06-28 LAB — TSH: TSH: 2.88 u[IU]/mL (ref 0.35–4.50)

## 2018-06-28 LAB — PSA: PSA: 0.74 ng/mL (ref 0.10–4.00)

## 2018-06-28 LAB — URINALYSIS, ROUTINE W REFLEX MICROSCOPIC
Bilirubin Urine: NEGATIVE
Ketones, ur: NEGATIVE
Leukocytes, UA: NEGATIVE
Nitrite: NEGATIVE
RBC / HPF: NONE SEEN (ref 0–?)
Specific Gravity, Urine: 1.015 (ref 1.000–1.030)
Total Protein, Urine: NEGATIVE
Urine Glucose: NEGATIVE
Urobilinogen, UA: 0.2 (ref 0.0–1.0)
WBC, UA: NONE SEEN (ref 0–?)
pH: 6 (ref 5.0–8.0)

## 2018-06-28 LAB — LIPID PANEL
Cholesterol: 197 mg/dL (ref 0–200)
HDL: 54.4 mg/dL (ref >39.00)
LDL Cholesterol: 110 mg/dL — ABNORMAL HIGH (ref 0–99)
NonHDL: 142.46
Total CHOL/HDL Ratio: 4
Triglycerides: 163 mg/dL — ABNORMAL HIGH (ref 0.0–149.0)
VLDL: 32.6 mg/dL (ref 0.0–40.0)

## 2018-06-28 LAB — VITAMIN D 25 HYDROXY (VIT D DEFICIENCY, FRACTURES): VITD: 22.92 ng/mL — ABNORMAL LOW (ref 30.00–100.00)

## 2018-06-28 LAB — HEMOGLOBIN A1C: Hgb A1c MFr Bld: 5.8 % (ref 4.6–6.5)

## 2018-06-28 MED ORDER — LISINOPRIL-HYDROCHLOROTHIAZIDE 20-12.5 MG PO TABS: 2.0000 | ORAL_TABLET | Freq: Every day | ORAL | 3 refills | Status: DC

## 2018-06-28 MED ORDER — LISINOPRIL-HYDROCHLOROTHIAZIDE 20-12.5 MG PO TABS: 2.0000 | ORAL_TABLET | Freq: Every day | ORAL | 0 refills | Status: DC

## 2018-06-28 NOTE — Patient Instructions (Addendum)
F/u in 3 months    Cholesterol Cholesterol is a white, waxy, fat-like substance that is needed by the human body in small amounts. The liver makes all the cholesterol we need. Cholesterol is carried from the liver by the blood through the blood vessels. Deposits of cholesterol (plaques) may build up on blood vessel (artery) walls. Plaques make the arteries narrower and stiffer. Cholesterol plaques increase the risk for heart attack and stroke. You cannot feel your cholesterol level even if it is very high. The only way to know that it is high is to have a blood test. Once you know your cholesterol levels, you should keep a record of the test results. Work with your health care provider to keep your levels in the desired range. What do the results mean?  Total cholesterol is a rough measure of all the cholesterol in your blood.  LDL (low-density lipoprotein) is the "bad" cholesterol. This is the type that causes plaque to build up on the artery walls. You want this level to be low.  HDL (high-density lipoprotein) is the "good" cholesterol because it cleans the arteries and carries the LDL away. You want this level to be high.  Triglycerides are fat that the body can either burn for energy or store. High levels are closely linked to heart disease. What are the desired levels of cholesterol?  Total cholesterol below 200.  LDL below 100 for people who are at risk, below 70 for people at very high risk.  HDL above 40 is good. A level of 60 or higher is considered to be protective against heart disease.  Triglycerides below 150. How can I lower my cholesterol? Diet Follow your diet program as told by your health care provider.  Choose fish or white meat chicken and [place], roasted or baked. Limit fatty cuts of red meat, fried foods, and processed meats, such as sausage and lunch meats.  Eat lots of fresh fruits and vegetables.  Choose whole grains, beans, pasta, potatoes, and  cereals.  Choose olive oil, corn oil, or canola oil, and use only small amounts.  Avoid butter, mayonnaise, shortening, or palm kernel oils.  Avoid foods with trans fats.  Drink skim or nonfat milk and eat low-fat or nonfat yogurt and cheeses. Avoid whole milk, cream, ice cream, egg yolks, and full-fat cheeses.  Healthier desserts include angel food cake, ginger snaps, animal crackers, hard candy, popsicles, and low-fat or nonfat frozen yogurt. Avoid pastries, cakes, pies, and cookies.  Exercise  Follow your exercise program as told by your health care provider. A regular program: ? Helps to decrease LDL and raise HDL. ? Helps with weight control.  Do things that increase your activity level, such as gardening, walking, and taking the stairs.  Ask your health care provider about ways that you can be more active in your daily life.  Medicine  Take over-the-counter and prescription medicines only as told by your health care provider. ? Medicine may be prescribed by your health care provider to help lower cholesterol and decrease the risk for heart disease. This is usually done if diet and exercise have failed to bring down cholesterol levels. ? If you have several risk factors, you may need medicine even if your levels are normal.  This information is not intended to replace advice given to you by your health care provider. Make sure you discuss any questions you have with your health care provider. Document Released: 06/28/2001 Document Revised: 04/30/2016 Document Reviewed: 04/02/2016 Elsevier Interactive Patient  Education  2018 [employer].  Hypertension Hypertension, commonly called high blood pressure, is when the force of blood pumping through the arteries is too strong. The arteries are the blood vessels that carry blood from the heart throughout the body. Hypertension forces the heart to work harder to pump blood and may cause arteries to become narrow or stiff. Having  untreated or uncontrolled hypertension can cause heart attacks, strokes, kidney disease, and other problems. A blood pressure reading consists of a higher number over a lower number. Ideally, your blood pressure should be below 120/80. The first ("top") number is called the systolic pressure. It is a measure of the pressure in your arteries as your heart beats. The second ("bottom") number is called the diastolic pressure. It is a measure of the pressure in your arteries as the heart relaxes. What are the causes? The cause of this condition is not known. What increases the risk? Some risk factors for high blood pressure are under your control. Others are not. Factors you can change  Smoking.  Having type 2 diabetes mellitus, high cholesterol, or both.  Not getting enough exercise or physical activity.  Being overweight.  Having too much fat, sugar, calories, or salt (sodium) in your diet.  Drinking too much alcohol. Factors that are difficult or impossible to change  Having chronic kidney disease.  Having a family history of high blood pressure.  Age. Risk increases with age.  Race. You may be at higher risk if you are African-American.  Gender. Men are at higher risk than women before age 22. After age 61, women are at higher risk than men.  Having obstructive sleep apnea.  Stress. What are the signs or symptoms? Extremely high blood pressure (hypertensive crisis) may cause:  Headache.  Anxiety.  Shortness of breath.  Nosebleed.  Nausea and vomiting.  Severe chest pain.  Jerky movements you cannot control (seizures).  How is this diagnosed? This condition is diagnosed by measuring your blood pressure while you are seated, with your arm resting on a surface. The cuff of the blood pressure monitor will be placed directly against the skin of your upper arm at the level of your heart. It should be measured at least twice using the same arm. Certain conditions can  cause a difference in blood pressure between your right and left arms. Certain factors can cause blood pressure readings to be lower or higher than normal (elevated) for a short period of time:  When your blood pressure is higher when you are in a health care provider's office than when you are at home, this is called white coat hypertension. Most people with this condition do not need medicines.  When your blood pressure is higher at home than when you are in a health care provider's office, this is called masked hypertension. Most people with this condition may need medicines to control blood pressure.  If you have a high blood pressure reading during one visit or you have normal blood pressure with other risk factors:  You may be asked to return on a different day to have your blood pressure checked again.  You may be asked to monitor your blood pressure at home for 1 week or longer.  If you are diagnosed with hypertension, you may have other blood or imaging tests to help your health care provider understand your overall risk for other conditions. How is this treated? This condition is treated by making healthy lifestyle changes, such as eating healthy foods, exercising  more, and reducing your alcohol intake. Your health care provider may prescribe medicine if lifestyle changes are not enough to get your blood pressure under control, and if:  Your systolic blood pressure is above 130.  Your diastolic blood pressure is above 80.  Your personal target blood pressure may vary depending on your medical conditions, your age, and other factors. Follow these instructions at home: Eating and drinking  Eat a diet that is high in fiber and potassium, and low in sodium, added sugar, and fat. An example eating plan is called the DASH (Dietary Approaches to Stop Hypertension) diet. To eat this way: ? Eat plenty of fresh fruits and vegetables. Try to fill half of your plate at each meal with fruits  and vegetables. ? Eat whole grains, such as whole wheat pasta, brown rice, or whole grain bread. Fill about one quarter of your plate with whole grains. ? Eat or drink low-fat dairy products, such as skim milk or low-fat yogurt. ? Avoid fatty cuts of meat, processed or cured meats, and poultry with skin. Fill about one quarter of your plate with lean proteins, such as fish, chicken without skin, beans, eggs, and tofu. ? Avoid premade and processed foods. These tend to be higher in sodium, added sugar, and fat.  Reduce your daily sodium intake. Most people with hypertension should eat less than 1,500 mg of sodium a day.  Limit alcohol intake to no more than 1 drink a day for nonpregnant women and 2 drinks a day for men. One drink equals 12 oz of beer, 5 oz of wine, or 1 oz of hard liquor. Lifestyle  Work with your health care provider to maintain a healthy body weight or to lose weight. Ask what an ideal weight is for you.  Get at least 30 minutes of exercise that causes your heart to beat faster (aerobic exercise) most days of the week. Activities may include walking, swimming, or biking.  Include exercise to strengthen your muscles (resistance exercise), such as pilates or lifting weights, as part of your weekly exercise routine. Try to do these types of exercises for 30 minutes at least 3 days a week.  Do not use any products that contain nicotine or tobacco, such as cigarettes and e-cigarettes. If you need help quitting, ask your health care provider.  Monitor your blood pressure at home as told by your health care provider.  Keep all follow-up visits as told by your health care provider. This is important. Medicines  Take over-the-counter and prescription medicines only as told by your health care provider. Follow directions carefully. Blood pressure medicines must be taken as prescribed.  Do not skip doses of blood pressure medicine. Doing this puts you at risk for problems and can  make the medicine less effective.  Ask your health care provider about side effects or reactions to medicines that you should watch for. Contact a health care provider if:  You think you are having a reaction to a medicine you are taking.  You have headaches that keep coming back (recurring).  You feel dizzy.  You have swelling in your ankles.  You have trouble with your vision. Get help right away if:  You develop a severe headache or confusion.  You have unusual weakness or numbness.  You feel faint.  You have severe pain in your chest or abdomen.  You vomit repeatedly.  You have trouble breathing. Summary  Hypertension is when the force of blood pumping through your arteries is  too strong. If this condition is not controlled, it may put you at risk for serious complications.  Your personal target blood pressure may vary depending on your medical conditions, your age, and other factors. For most people, a normal blood pressure is less than 120/80.  Hypertension is treated with lifestyle changes, medicines, or a combination of both. Lifestyle changes include weight loss, eating a healthy, low-sodium diet, exercising more, and limiting alcohol. This information is not intended to replace advice given to you by your health care provider. Make sure you discuss any questions you have with your health care provider. Document Released: 10/03/2005 Document Revised: 08/31/2016 Document Reviewed: 08/31/2016 Elsevier Interactive Patient Education  [employer].

## 2018-06-28 NOTE — Progress Notes (Signed)
Pre visit review using our clinic review tool, if applicable. No additional management support is needed unless otherwise documented below in the visit note. 

## 2018-06-28 NOTE — Progress Notes (Signed)
Chief Complaint  Patient presents with  . Transitions Of Care   TOC 1. HTN elevated takes BP meds qhs norvasc 5 mg and lis-hct 20-25 qhs  2. RLS controlled on requip 6 mg qhs  3. Anxiety/depression/insomnia/marital problems wife of 20+ years having affair but they are trying to stay together she is away not visiting the other person-will check on referral to psychiatry today and also refer therapy here. Denies SI but has little interest in doing things. He is on trazadone 300 mg qhs ambien 10 mg qhs and ativan 0.5 mg which he takes 1-2 x per month  4. Prediabetes and obesity    Review of Systems  Constitutional: Negative for weight loss.  HENT: Negative for hearing loss.   Eyes: Negative for blurred vision.  Respiratory: Negative for shortness of breath.   Cardiovascular: Negative for chest pain.  Gastrointestinal: Negative for abdominal pain.  Skin: Negative for rash.  Neurological: Negative for headaches.  Psychiatric/Behavioral: Positive for depression. Negative for suicidal ideas. The patient is nervous/anxious and has insomnia.    Past Medical History:  Diagnosis Date  . Adenomatous colon polyp    tubular  . Adenomatous rectal polyp    tubular  . Asthma   . Depression   . Diverticulosis   . GERD (gastroesophageal reflux disease)   . History of chicken pox   . Hypertension   . Lung nodule    CT 10/2014  . Sleep apnea    wears cpap   Past Surgical History:  Procedure Laterality Date  . COLONOSCOPY    . COLONOSCOPY W/ POLYPECTOMY  2015   [facility], benign  . HERNIA REPAIR     inguinal  . POLYPECTOMY    . TONSILLECTOMY     age 40    Family History  Problem Relation Age of Onset  . Depression Mother   . Hypertension Father   . Heart disease Father   . Stroke Father   . Cancer Maternal Grandmother        ovarian?  . Cancer Paternal Grandfather        prostate  . Prostate cancer Paternal Grandfather   . Colon cancer Neg Hx   . Colon polyps Neg Hx    Social  History   Socioeconomic History  . Marital status: Married    Spouse name: Not on file  . Number of children: Not on file  . Years of education: Not on file  . Highest education level: Not on file  Occupational History  . Not on file  Social Needs  . Financial resource strain: Not hard at all  . Food insecurity:    Worry: Never true    Inability: Never true  . Transportation needs:    Medical: No    Non-medical: No  Tobacco Use  . Smoking status: Never Smoker  . Smokeless tobacco: Never Used  Substance and Sexual Activity  . Alcohol use: No    Alcohol/week: 0.0 standard drinks    Comment: stopped drinking 20 years ago  . Drug use: No  . Sexual activity: Never  Lifestyle  . Physical activity:    Days per week: Not on file    Minutes per session: Not on file  . Stress: Not at all  Relationships  . Social connections:    Talks on phone: Not on file    Gets together: Not on file    Attends religious service: Not on file    Active member of club or organization:  Not on file    Attends meetings of clubs or organizations: Not on file    Relationship status: Not on file  . Intimate partner violence:    Fear of current or ex partner: No    Emotionally abused: No    Physically abused: No    Forced sexual activity: No  Other Topics Concern  . Not on file  Social History Narrative   Lives in [place] with wife, [name]. 1 daughter in [place].      Work - Optician, dispensing, now Animal nutritionist               Current Meds  Medication Sig  . albuterol (PROVENTIL HFA;VENTOLIN HFA) 108 (90 BASE) MCG/ACT inhaler Inhale 2 puffs into the lungs every 6 (six) hours as needed for wheezing or shortness of breath.  Marland Kitchen amLODipine (NORVASC) 5 MG tablet Take 1 tablet (5 mg total) by mouth daily.  Marland Kitchen dicyclomine (BENTYL) 10 MG capsule Take 1 capsule (10 mg total) by mouth 2 (two) times daily.  Marland Kitchen gabapentin (NEURONTIN) 600 MG tablet Take 1 tablet by mouth 3 times a day  . hyoscyamine  (LEVSIN SL) 0.125 MG SL tablet Take 0.125 mg by mouth every 4 (four) hours as needed.  Marland Kitchen lisinopril-hydrochlorothiazide (PRINZIDE,ZESTORETIC) 20-25 MG tablet Take 1 tablet by mouth every day  . LORazepam (ATIVAN) 0.5 MG tablet TAKE 1 TO 2 TABLETS BY MOUTH AT BEDTIME, DO NOT TAKE WITH AMBIEN  . nystatin cream (MYCOSTATIN) Apply 1 application topically 2 (two) times daily.  . ranitidine (ZANTAC) 150 MG tablet Take 1 tablet (150 mg total) by mouth 2 (two) times daily.  Marland Kitchen rOPINIRole (REQUIP) 2 MG tablet TAKE 3 TABLETS(6 MG) BY MOUTH AT BEDTIME  . rOPINIRole (REQUIP) 4 MG tablet Take 1 tablet by mouth at bedtime. NOTE Tablet increased from 1 mg to 4 mg so only take 1 pill qhs  . trazodone (DESYREL) 300 MG tablet Take 1 tablet by mouth at bedtime  . zolpidem (AMBIEN) 10 MG tablet Take 1 tablet (10 mg total) by mouth at bedtime as needed. for sleep   Allergies  Allergen Reactions  . Nsaids Other (See Comments)    GI Issues   No results found for this or any previous visit (from the past 2160 hour(s)). Objective  Body mass index is 46.94 kg/m. Wt Readings from Last 3 Encounters:  06/28/18 290 lb 12.8 oz (131.9 kg)  04/04/18 287 lb 12.8 oz (130.5 kg)  03/16/18 289 lb (131.1 kg)   Temp Readings from Last 3 Encounters:  06/28/18 98.3 F (36.8 C) (Oral)  04/04/18 98.3 F (36.8 C) (Oral)  03/16/18 97.8 F (36.6 C) (Tympanic)   BP Readings from Last 3 Encounters:  06/28/18 (!) 156/80  04/04/18 132/72  03/30/18 128/80   Pulse Readings from Last 3 Encounters:  06/28/18 75  04/04/18 71  03/30/18 64    Physical Exam  Constitutional: He is oriented to person, place, and time. He appears well-developed and well-nourished. He is cooperative.  HENT:  Head: Normocephalic and atraumatic.  Mouth/Throat: Oropharynx is clear and moist and mucous membranes are normal.  Eyes: Pupils are equal, round, and reactive to light. Conjunctivae are normal.  Cardiovascular: Normal rate, regular rhythm and  normal heart sounds.  Pulmonary/Chest: Effort normal and breath sounds normal.  Neurological: He is alert and oriented to person, place, and time. Gait normal.  Skin: Skin is warm, dry and intact.  Psychiatric: He has a normal mood and affect. His speech  is normal and behavior is normal. Judgment and thought content normal. Cognition and memory are normal.  Nursing note and vitals reviewed.   Assessment   1. HTN 2 RLS 3. Anxiety/depression/insomnia/marital issues  4. Prediabetes and obesity  5. HM 6. OSA on cpap  Plan   1. Increase lis 20-25 to 40-25 and do norvasc 5 mg taking qhs  2. On requip 6 mg qhs  3. Referred therapy today and check on psychiatry referral appt 06/28/18 for med management and tx Check labs today CMET, lipid, TSH, UA, hep C, vitamin D, anemia labs, PSA, A1C, MMR 4. Check labs rec healthy diet and exercise  5.  Flu given today  tdap utd, prevnar, pna 23  Consider shingrix in future  Never smoker  Check PSA consider DRE in future  Colonoscopy h/o multiple polyps due 04/12/2019 [facility]  Consider derm in future no needed now  Check hep C   6. Compliant with cpap     Provider: Dr. [name] McLean-Scocuzza-[facility]

## 2018-06-29 ENCOUNTER — Other Ambulatory Visit: Payer: Self-pay | Admitting: Internal Medicine

## 2018-06-29 DIAGNOSIS — E559 Vitamin D deficiency, unspecified: Secondary | ICD-10-CM | POA: Insufficient documentation

## 2018-06-29 MED ORDER — CHOLECALCIFEROL 1.25 MG (50000 UT) PO CAPS: 50000.0000 [IU] | ORAL_CAPSULE | ORAL | 1 refills | Status: DC

## 2018-06-30 ENCOUNTER — Other Ambulatory Visit: Payer: Self-pay | Admitting: Family Medicine

## 2018-07-02 ENCOUNTER — Telehealth: Payer: Self-pay | Admitting: Internal Medicine

## 2018-07-02 LAB — TEST AUTHORIZATION

## 2018-07-02 LAB — MEASLES/MUMPS/RUBELLA IMMUNITY
Mumps IgG: >300 AU/mL
Rubella: 26.2 index
Rubeola IgG: >300 AU/mL

## 2018-07-02 LAB — HEPATITIS C ANTIBODY
Hepatitis C Ab: NONREACTIVE
SIGNAL TO CUT-OFF: 0.21 (ref <1.00)

## 2018-07-02 LAB — IRON,TIBC AND FERRITIN PANEL
%SAT: 46 % (calc) (ref 20–48)
Ferritin: 355 ng/mL (ref 24–380)
Iron: 149 ug/dL (ref 50–180)
TIBC: 322 mcg/dL (calc) (ref 250–425)

## 2018-07-02 NOTE — Telephone Encounter (Signed)
Referral request

## 2018-07-02 NOTE — Telephone Encounter (Signed)
Looks like he has been contacted. Please advise. I see one for both types in chart

## 2018-07-02 NOTE — Telephone Encounter (Signed)
Copied from [place] [phone]. Topic: Referral - Question >> Jul 02, 2018  8:52 AM Gardiner Ramus wrote: Reason for CRM: pt called and stated that him and Dr Jacklynn Lewis discussed a referral to a physiatrist. Did not see a referral in.  Pt is checking status. Please advise

## 2018-07-03 ENCOUNTER — Telehealth: Payer: Self-pay

## 2018-07-03 ENCOUNTER — Ambulatory Visit (INDEPENDENT_AMBULATORY_CARE_PROVIDER_SITE_OTHER): Payer: PPO | Admitting: Psychology

## 2018-07-03 DIAGNOSIS — F4323 Adjustment disorder with mixed anxiety and depressed mood: Secondary | ICD-10-CM | POA: Diagnosis not present

## 2018-07-03 NOTE — Telephone Encounter (Signed)
Sent Requip 2 mg take 3 at night for RLS #90 with refills Inform pt via mychart    [place]

## 2018-07-03 NOTE — Telephone Encounter (Signed)
[facility] has reached out to him and left message to rtc.

## 2018-07-03 NOTE — Telephone Encounter (Signed)
Refill request for Ropinirole 2 MG tablet   Last OV 03/06/2018   Last refilled

## 2018-07-03 NOTE — Telephone Encounter (Signed)
Refilled request Ropinirole 2 MG   Last OV 03/06/2018   Last refilled 04/04/2018 disp 270 with 1 refill   Pt has no more refills but enough medication to last them until December   Sent to PCP to advise

## 2018-07-03 NOTE — Telephone Encounter (Signed)
PCP is Aundra Dubin   Last OV wit Caryl Bis was 03/06/2018   Last OV with PCP 06/28/2018   Last refilled 04/04/2018 disp 270 with 1 refill   Sent to PCP to advise

## 2018-07-04 DIAGNOSIS — M5136 Other intervertebral disc degeneration, lumbar region: Secondary | ICD-10-CM | POA: Diagnosis not present

## 2018-07-04 DIAGNOSIS — M546 Pain in thoracic spine: Secondary | ICD-10-CM | POA: Diagnosis not present

## 2018-07-04 DIAGNOSIS — M9902 Segmental and somatic dysfunction of thoracic region: Secondary | ICD-10-CM | POA: Diagnosis not present

## 2018-07-04 DIAGNOSIS — M9903 Segmental and somatic dysfunction of lumbar region: Secondary | ICD-10-CM | POA: Diagnosis not present

## 2018-07-06 NOTE — Telephone Encounter (Signed)
Patient was informed.  Patient understood and no questions, comments, or concerns at this time.  

## 2018-07-09 ENCOUNTER — Other Ambulatory Visit: Payer: Self-pay | Admitting: Internal Medicine

## 2018-07-09 MED ORDER — TRAZODONE HCL 300 MG PO TABS: 300.0000 mg | ORAL_TABLET | Freq: Every day | ORAL | 0 refills | Status: DC

## 2018-07-09 NOTE — Telephone Encounter (Signed)
Desyrel 300 mg refill Last Refill:04/12/18 # 90  0 refills Last OV: 06/28/18 PCP: Aundra Dubin Pharmacy:[facility] [phone]

## 2018-07-09 NOTE — Telephone Encounter (Signed)
Copied from [place]. Topic: Quick Communication - Rx Refill/Question >> Jul 09, 2018 11:45 AM Selinda Flavin B, NT wrote: Medication: trazodone (DESYREL) 300 MG tablet   Has the patient contacted their pharmacy? Yes.   (Agent: If no, request that the patient contact the pharmacy for the refill.) (Agent: If yes, when and what did the pharmacy advise?)  Preferred Pharmacy (with phone number or street name): [id number] - [place], [place]: Please be advised that RX refills may take up to 3 business days. We ask that you follow-up with your pharmacy.

## 2018-07-10 ENCOUNTER — Ambulatory Visit: Payer: PPO | Admitting: Psychology

## 2018-07-10 ENCOUNTER — Telehealth: Payer: Self-pay | Admitting: Internal Medicine

## 2018-07-10 ENCOUNTER — Other Ambulatory Visit: Payer: Self-pay | Admitting: Internal Medicine

## 2018-07-10 DIAGNOSIS — J9801 Acute bronchospasm: Secondary | ICD-10-CM

## 2018-07-10 DIAGNOSIS — F4323 Adjustment disorder with mixed anxiety and depressed mood: Secondary | ICD-10-CM | POA: Diagnosis not present

## 2018-07-10 DIAGNOSIS — J45909 Unspecified asthma, uncomplicated: Secondary | ICD-10-CM

## 2018-07-10 MED ORDER — ALBUTEROL SULFATE HFA 108 (90 BASE) MCG/ACT IN AERS: 1.0000 | INHALATION_SPRAY | Freq: Four times a day (QID) | RESPIRATORY_TRACT | 11 refills | Status: DC | PRN

## 2018-07-10 NOTE — Telephone Encounter (Signed)
I am not seeing this on his current medication list.

## 2018-07-10 NOTE — Telephone Encounter (Signed)
Pt is requesting a refill on his inhaler.

## 2018-07-11 ENCOUNTER — Other Ambulatory Visit: Payer: Self-pay | Admitting: Internal Medicine

## 2018-07-11 DIAGNOSIS — E785 Hyperlipidemia, unspecified: Secondary | ICD-10-CM

## 2018-07-11 MED ORDER — ATORVASTATIN CALCIUM 10 MG PO TABS: 10.0000 mg | ORAL_TABLET | Freq: Every day | ORAL | 3 refills | Status: DC

## 2018-07-24 ENCOUNTER — Ambulatory Visit (INDEPENDENT_AMBULATORY_CARE_PROVIDER_SITE_OTHER): Payer: PPO | Admitting: Psychology

## 2018-07-24 DIAGNOSIS — F4323 Adjustment disorder with mixed anxiety and depressed mood: Secondary | ICD-10-CM

## 2018-07-26 ENCOUNTER — Other Ambulatory Visit: Payer: Self-pay | Admitting: Family Medicine

## 2018-08-01 ENCOUNTER — Telehealth: Payer: Self-pay | Admitting: Internal Medicine

## 2018-08-01 NOTE — Telephone Encounter (Signed)
OK to do one tablet daily Ranitidine 150 mg last ov 9/19

## 2018-08-01 NOTE — Telephone Encounter (Signed)
Copied from [place] [id number]. Topic: General - Other >> Aug 01, 2018 11:11 AM Lennox Solders wrote: Reason for CRM:pt is calling and needs a refill on ranitidine 150 mg #90. Pt would like to take one pill a day instead of 2 pills daily. Pt saw dr Aundra Dubin sept 2019. [place] in [place]

## 2018-08-02 ENCOUNTER — Other Ambulatory Visit: Payer: Self-pay | Admitting: Internal Medicine

## 2018-08-02 DIAGNOSIS — K219 Gastro-esophageal reflux disease without esophagitis: Secondary | ICD-10-CM

## 2018-08-02 MED ORDER — FAMOTIDINE 20 MG PO TABS: 20.0000 mg | ORAL_TABLET | Freq: Every day | ORAL | 3 refills | Status: DC

## 2018-08-02 NOTE — Telephone Encounter (Signed)
Advise pt will change to pepcid [facility] due to recent recall on zantac   [place]

## 2018-08-03 NOTE — Telephone Encounter (Signed)
Patient notified and voiced understanding.

## 2018-08-07 ENCOUNTER — Ambulatory Visit: Payer: PPO | Admitting: Psychology

## 2018-08-17 ENCOUNTER — Other Ambulatory Visit: Payer: Self-pay | Admitting: Internal Medicine

## 2018-08-17 NOTE — Telephone Encounter (Signed)
Copied from [place] [id number]. Topic: Quick Communication - Rx Refill/Question >> Aug 17, 2018 11:28 AM Scherrie Gerlach wrote: Medication: dicyclomine (BENTYL) 10 MG capsule LORazepam (ATIVAN) 0.5 MG tablet Pt states the pharmacy advised him to call the doctor for refills [place], [place] [phone] (Phone) [phone] (Fax)

## 2018-08-17 NOTE — Telephone Encounter (Signed)
Requested medication (s) are due for refill today: yes to both dicyclomine and lorazepam  Requested medication (s) are on the active medication list: yes   Last refill:  Dicyclomine:03/21/17 #180 1 RF                   Lorazepam: 04/12/18  Future visit scheduled: yes  Notes to clinic:  Dicyclomine last filled 2018                           Lorazepam controlled substance   Requested Prescriptions  Pending Prescriptions Disp Refills   dicyclomine (BENTYL) 10 MG capsule 180 capsule 1    Sig: Take 1 capsule (10 mg total) by mouth 2 (two) times daily.     Gastroenterology:  Antispasmodic Agents Passed - 08/17/2018 11:34 AM      Passed - Last Heart Rate in normal range    Pulse Readings from Last 1 Encounters:  06/28/18 75         Passed - Valid encounter within last 12 months    Recent Outpatient Visits          1 month ago Essential hypertension   [place] McLean-Scocuzza, [name], MD   5 months ago Essential hypertension   [place], Eric G, MD   10 months ago RLS (restless legs syndrome)   [facility] [name], MD   1 year ago Encounter for [employer] annual wellness exam   [place], [place], DO   1 year ago Morbid obesity with BMI of 45.0-49.9, adult [place])   [place], Barnie Del, DO      Future Appointments            In 1 month McLean-Scocuzza, [name], MD [place], [place]   In 7 months O'Brien-Blaney, Bryson Corona, LPN [place], [place]   In 7 months McLean-Scocuzza, [name], MD [place], [facility]          LORazepam (ATIVAN) 0.5 MG tablet 60 tablet 1     Not Delegated - Psychiatry:  Anxiolytics/Hypnotics Failed - 08/17/2018 11:34 AM      Failed - This refill cannot be delegated      Failed - Urine Drug Screen completed in last 360 days.      Passed - Valid encounter within last  6 months    Recent Outpatient Visits          1 month ago Essential hypertension   [place] McLean-Scocuzza, [name], MD   5 months ago Essential hypertension   [place], Eric G, MD   10 months ago RLS (restless legs syndrome)   [facility] [name], MD   1 year ago Encounter for Medicare annual wellness exam   [place], [place], DO   1 year ago Morbid obesity with BMI of 45.0-49.9, adult [facility])   [place], [place], [place]      Future Appointments            In 1 month McLean-Scocuzza, [name], MD [place], [place]   In 7 months O'Brien-Blaney, [place], [place], [place]   In 7 months McLean-Scocuzza, [name], MD [facility], [facility]

## 2018-08-20 ENCOUNTER — Other Ambulatory Visit: Payer: Self-pay | Admitting: Internal Medicine

## 2018-08-20 DIAGNOSIS — K589 Irritable bowel syndrome without diarrhea: Secondary | ICD-10-CM

## 2018-08-20 DIAGNOSIS — F419 Anxiety disorder, unspecified: Secondary | ICD-10-CM

## 2018-08-20 MED ORDER — LORAZEPAM 0.5 MG PO TABS: ORAL_TABLET | ORAL | 2 refills | Status: DC

## 2018-08-20 MED ORDER — DICYCLOMINE HCL 10 MG PO CAPS: 10.0000 mg | ORAL_CAPSULE | Freq: Two times a day (BID) | ORAL | 3 refills | Status: DC

## 2018-08-21 ENCOUNTER — Ambulatory Visit (INDEPENDENT_AMBULATORY_CARE_PROVIDER_SITE_OTHER): Payer: PPO | Admitting: Psychology

## 2018-08-21 DIAGNOSIS — F4323 Adjustment disorder with mixed anxiety and depressed mood: Secondary | ICD-10-CM | POA: Diagnosis not present

## 2018-08-24 ENCOUNTER — Telehealth: Payer: Self-pay | Admitting: Internal Medicine

## 2018-08-24 ENCOUNTER — Other Ambulatory Visit: Payer: Self-pay | Admitting: Internal Medicine

## 2018-08-24 DIAGNOSIS — I10 Essential (primary) hypertension: Secondary | ICD-10-CM

## 2018-08-24 NOTE — Telephone Encounter (Signed)
Copied from [place] [id number]. Topic: Quick Communication - Rx Refill/Question >> Aug 24, 2018  1:37 PM Selinda Flavin B, [place] wrote: **Patient requesting this medication be sent to the [facility]. States it was last sent to mail order and this is one he does not want to go to mail order. Please advise. ** Medication: amLODipine (NORVASC) 5 MG tablet   Has the patient contacted their pharmacy? Yes.   (Agent: If no, request that the patient contact the pharmacy for the refill.) (Agent: If yes, when and what did the pharmacy advise?)  Preferred Pharmacy (with phone number or street name): [facility] #[id number] - [address], [place]: Please be advised that RX refills may take up to 3 business days. We ask that you follow-up with your pharmacy.

## 2018-09-03 ENCOUNTER — Other Ambulatory Visit: Payer: Self-pay | Admitting: Internal Medicine

## 2018-09-03 ENCOUNTER — Ambulatory Visit (INDEPENDENT_AMBULATORY_CARE_PROVIDER_SITE_OTHER): Payer: PPO | Admitting: Psychology

## 2018-09-03 ENCOUNTER — Telehealth: Payer: Self-pay | Admitting: Internal Medicine

## 2018-09-03 DIAGNOSIS — F4323 Adjustment disorder with mixed anxiety and depressed mood: Secondary | ICD-10-CM

## 2018-09-03 DIAGNOSIS — I10 Essential (primary) hypertension: Secondary | ICD-10-CM

## 2018-09-03 DIAGNOSIS — G2581 Restless legs syndrome: Secondary | ICD-10-CM

## 2018-09-03 MED ORDER — ROPINIROLE HCL 2 MG PO TABS: 6.0000 mg | ORAL_TABLET | Freq: Every day | ORAL | 0 refills | Status: DC

## 2018-09-03 MED ORDER — AMLODIPINE BESYLATE 5 MG PO TABS: 5.0000 mg | ORAL_TABLET | Freq: Every day | ORAL | 1 refills | Status: DC

## 2018-09-03 NOTE — Telephone Encounter (Signed)
He will need to see neurology and psychiatry I am not agreeable to this increase so I will refill for how I had him take the medications until he can seek further specialist help   Thanks [place]

## 2018-09-03 NOTE — Telephone Encounter (Signed)
Please set up with neurology Dr. Manuella Ghazi or [facility] neurology who ever has earliest appt for legs shaking, RLS uncontrolled on Requip 6 mg and increased to 10 mg w/o rec. Of doctor   Please set up appt with psychiatry [facility] or Dr. Aleen Campi   anxiety/depression/insomnia uncontrolled   Referrals placed in 03/2018 and 06/2018 and pt needs help asap  Thanks [place]

## 2018-09-04 NOTE — Telephone Encounter (Signed)
Left voice mail for patient to call back ok for [facility] to speak to patient ,ok for patient regarding below message

## 2018-09-05 ENCOUNTER — Ambulatory Visit: Payer: Self-pay | Admitting: Internal Medicine

## 2018-09-06 ENCOUNTER — Ambulatory Visit (INDEPENDENT_AMBULATORY_CARE_PROVIDER_SITE_OTHER): Payer: PPO | Admitting: Internal Medicine

## 2018-09-06 ENCOUNTER — Ambulatory Visit (INDEPENDENT_AMBULATORY_CARE_PROVIDER_SITE_OTHER): Payer: PPO

## 2018-09-06 ENCOUNTER — Encounter: Payer: Self-pay | Admitting: Internal Medicine

## 2018-09-06 VITALS — BP 140/82 | HR 100 | Temp 98.3°F | Ht 66.0 in | Wt 290.4 lb

## 2018-09-06 DIAGNOSIS — G4700 Insomnia, unspecified: Secondary | ICD-10-CM | POA: Diagnosis not present

## 2018-09-06 DIAGNOSIS — F32A Depression, unspecified: Secondary | ICD-10-CM

## 2018-09-06 DIAGNOSIS — M25571 Pain in right ankle and joints of right foot: Secondary | ICD-10-CM | POA: Diagnosis not present

## 2018-09-06 DIAGNOSIS — I10 Essential (primary) hypertension: Secondary | ICD-10-CM

## 2018-09-06 DIAGNOSIS — F329 Major depressive disorder, single episode, unspecified: Secondary | ICD-10-CM

## 2018-09-06 DIAGNOSIS — G8929 Other chronic pain: Secondary | ICD-10-CM

## 2018-09-06 DIAGNOSIS — F419 Anxiety disorder, unspecified: Secondary | ICD-10-CM

## 2018-09-06 DIAGNOSIS — E559 Vitamin D deficiency, unspecified: Secondary | ICD-10-CM | POA: Diagnosis not present

## 2018-09-06 DIAGNOSIS — E785 Hyperlipidemia, unspecified: Secondary | ICD-10-CM | POA: Diagnosis not present

## 2018-09-06 DIAGNOSIS — F1011 Alcohol abuse, in remission: Secondary | ICD-10-CM

## 2018-09-06 HISTORY — DX: Other chronic pain: G89.29

## 2018-09-06 MED ORDER — CHOLECALCIFEROL 1.25 MG (50000 UT) PO CAPS: 50000.0000 [IU] | ORAL_CAPSULE | ORAL | 1 refills | Status: DC

## 2018-09-06 MED ORDER — EZETIMIBE 10 MG PO TABS: 10.0000 mg | ORAL_TABLET | Freq: Every day | ORAL | 3 refills | Status: DC

## 2018-09-06 NOTE — Patient Instructions (Addendum)
Dr. Aleen Campi psychiatry  Dr. Laymond Purser podiatry  D3 5000 IU daily starting Month 7    Cholesterol Cholesterol is a white, waxy, fat-like substance that is needed by the human body in small amounts. The liver makes all the cholesterol we need. Cholesterol is carried from the liver by the blood through the blood vessels. Deposits of cholesterol (plaques) may build up on blood vessel (artery) walls. Plaques make the arteries narrower and stiffer. Cholesterol plaques increase the risk for heart attack and stroke. You cannot feel your cholesterol level even if it is very high. The only way to know that it is high is to have a blood test. Once you know your cholesterol levels, you should keep a record of the test results. Work with your health care provider to keep your levels in the desired range. What do the results mean?  Total cholesterol is a rough measure of all the cholesterol in your blood.  LDL (low-density lipoprotein) is the "bad" cholesterol. This is the type that causes plaque to build up on the artery walls. You want this level to be low.  HDL (high-density lipoprotein) is the "good" cholesterol because it cleans the arteries and carries the LDL away. You want this level to be high.  Triglycerides are fat that the body can either burn for energy or store. High levels are closely linked to heart disease. What are the desired levels of cholesterol?  Total cholesterol below 200.  LDL below 100 for people who are at risk, below 70 for people at very high risk.  HDL above 40 is good. A level of 60 or higher is considered to be protective against heart disease.  Triglycerides below 150. How can I lower my cholesterol? Diet Follow your diet program as told by your health care provider.  Choose fish or white meat chicken and [place], roasted or baked. Limit fatty cuts of red meat, fried foods, and processed meats, such as sausage and lunch meats.  Eat lots of fresh fruits and  vegetables.  Choose whole grains, beans, pasta, potatoes, and cereals.  Choose olive oil, corn oil, or canola oil, and use only small amounts.  Avoid butter, mayonnaise, shortening, or palm kernel oils.  Avoid foods with trans fats.  Drink skim or nonfat milk and eat low-fat or nonfat yogurt and cheeses. Avoid whole milk, cream, ice cream, egg yolks, and full-fat cheeses.  Healthier desserts include angel food cake, ginger snaps, animal crackers, hard candy, popsicles, and low-fat or nonfat frozen yogurt. Avoid pastries, cakes, pies, and cookies.  Exercise  Follow your exercise program as told by your health care provider. A regular program: ? Helps to decrease LDL and raise HDL. ? Helps with weight control.  Do things that increase your activity level, such as gardening, walking, and taking the stairs.  Ask your health care provider about ways that you can be more active in your daily life.  Medicine  Take over-the-counter and prescription medicines only as told by your health care provider. ? Medicine may be prescribed by your health care provider to help lower cholesterol and decrease the risk for heart disease. This is usually done if diet and exercise have failed to bring down cholesterol levels. ? If you have several risk factors, you may need medicine even if your levels are normal.  This information is not intended to replace advice given to you by your health care provider. Make sure you discuss any questions you have with your health care  provider. Document Released: 06/28/2001 Document Revised: 04/30/2016 Document Reviewed: 04/02/2016 Elsevier Interactive Patient Education  2018 [place] refers to food and lifestyle choices that are based on the traditions of countries located on the [employer]. This way of eating has been shown to help prevent certain conditions and improve outcomes for people who have chronic  diseases, like kidney disease and heart disease. What are tips for following this plan? Lifestyle  Cook and eat meals together with your family, when possible.  Drink enough fluid to keep your urine clear or pale yellow.  Be physically active every day. This includes: ? Aerobic exercise like running or swimming. ? Leisure activities like gardening, walking, or housework.  Get 7-8 hours of sleep each night.  If recommended by your health care provider, drink red wine in moderation. This means 1 glass a day for nonpregnant women and 2 glasses a day for men. A glass of wine equals 5 oz (150 mL). Reading food labels  Check the serving size of packaged foods. For foods such as rice and pasta, the serving size refers to the amount of cooked product, not dry.  Check the total fat in packaged foods. Avoid foods that have saturated fat or trans fats.  Check the ingredients list for added sugars, such as corn syrup. Shopping  At the grocery store, buy most of your food from the areas near the walls of the store. This includes: ? Fresh fruits and vegetables (produce). ? Grains, beans, nuts, and seeds. Some of these may be available in unpackaged forms or large amounts (in bulk). ? Fresh seafood. ? Poultry and eggs. ? Low-fat dairy products.  Buy whole ingredients instead of prepackaged foods.  Buy fresh fruits and vegetables in-season from local farmers markets.  Buy frozen fruits and vegetables in resealable bags.  If you do not have access to quality fresh seafood, buy precooked frozen shrimp or canned fish, such as tuna, salmon, or sardines.  Buy small amounts of raw or cooked vegetables, salads, or olives from the deli or salad bar at your store.  Stock your pantry so you always have certain foods on hand, such as olive oil, canned tuna, canned tomatoes, rice, pasta, and beans. Cooking  Cook foods with extra-virgin olive oil instead of using butter or other vegetable  oils.  Have meat as a side dish, and have vegetables or grains as your main dish. This means having meat in small portions or adding small amounts of meat to foods like pasta or stew.  Use beans or vegetables instead of meat in common dishes like chili or lasagna.  Experiment with different cooking methods. Try roasting or broiling vegetables instead of steaming or sauteing them.  Add frozen vegetables to soups, stews, pasta, or rice.  Add nuts or seeds for added healthy fat at each meal. You can add these to yogurt, salads, or vegetable dishes.  Marinate fish or vegetables using olive oil, lemon juice, garlic, and fresh herbs. Meal planning  Plan to eat 1 vegetarian meal one day each week. Try to work up to 2 vegetarian meals, if possible.  Eat seafood 2 or more times a week.  Have healthy snacks readily available, such as: ? Vegetable sticks with hummus. ? [place] yogurt. ? Fruit and nut trail mix.  Eat balanced meals throughout the week. This includes: ? Fruit: 2-3 servings a day ? Vegetables: 4-5 servings a day ? Low-fat dairy: 2 servings a day ? Fish,  poultry, or lean meat: 1 serving a day ? Beans and legumes: 2 or more servings a week ? Nuts and seeds: 1-2 servings a day ? Whole grains: 6-8 servings a day ? Extra-virgin olive oil: 3-4 servings a day  Limit red meat and sweets to only a few servings a month What are my food choices?  Mediterranean diet ? Recommended ? Grains: Whole-grain pasta. Brown rice. Bulgar wheat. Polenta. Couscous. Whole-wheat bread. [place]. ? Vegetables: Artichokes. Beets. Broccoli. Cabbage. Carrots. Eggplant. Green beans. Chard. Kale. Spinach. Onions. Leeks. Peas. Squash. Tomatoes. Peppers. Radishes. ? Fruits: Apples. Apricots. Avocado. Berries. Bananas. Cherries. Dates. Figs. Grapes. Lemons. Melon. Oranges. Peaches. Plums. Pomegranate. ? Meats and other protein foods: Beans. Almonds. Sunflower seeds. Pine nuts. Peanuts. [place]. Salmon.  Scallops. Shrimp. [place]. Tilapia. Clams. Oysters. Eggs. ? Dairy: Low-fat milk. Cheese. Greek yogurt. ? Beverages: Water. Red wine. Herbal tea. ? Fats and oils: Extra virgin olive oil. Avocado oil. Grape seed oil. ? Sweets and desserts: [place] yogurt with honey. Baked apples. Poached pears. Trail mix. ? Seasoning and other foods: Basil. Cilantro. Coriander. Cumin. Mint. Parsley. Sage. Rosemary. Tarragon. Garlic. Oregano. Thyme. Pepper. Balsalmic vinegar. Tahini. Hummus. Tomato sauce. Olives. Mushrooms. ? Limit these ? Grains: Prepackaged pasta or rice dishes. Prepackaged cereal with added sugar. ? Vegetables: Deep fried potatoes (french fries). ? Fruits: Fruit canned in syrup. ? Meats and other protein foods: Beef. Pork. Lamb. Poultry with skin. Hot dogs. [place] Salines. ? Dairy: Ice cream. Sour cream. Whole milk. ? Beverages: Juice. Sugar-sweetened soft drinks. Beer. Liquor and spirits. ? Fats and oils: Butter. Canola oil. Vegetable oil. Beef fat (tallow). Lard. ? Sweets and desserts: Cookies. Cakes. Pies. Candy. ? Seasoning and other foods: Mayonnaise. Premade sauces and marinades. ? The items listed may not be a complete list. Talk with your dietitian about what dietary choices are right for you. Summary  The Mediterranean diet includes both food and lifestyle choices.  Eat a variety of fresh fruits and vegetables, beans, nuts, seeds, and whole grains.  Limit the amount of red meat and sweets that you eat.  Talk with your health care provider about whether it is safe for you to drink red wine in moderation. This means 1 glass a day for nonpregnant women and 2 glasses a day for men. A glass of wine equals 5 oz (150 mL). This information is not intended to replace advice given to you by your health care provider. Make sure you discuss any questions you have with your health care provider. Document Released: 05/26/2016 Document Revised: 06/28/2016 Document Reviewed: 05/26/2016 Elsevier Interactive  Patient Education  [employer].

## 2018-09-06 NOTE — Progress Notes (Addendum)
Chief Complaint  Patient presents with  . Follow-up   F/u with wife  1. HTN improved, HLD did not want to take lipitor 2. Anxiety/depression/insomnia/h/o etoh abuse he was sleep walking on ambien 10 mg qhs he took Trazadone 300 mg qhs and another dose =600 mg qhs one night and wife states was hallucinating and confused, and wife states h/o etoh abuse 20 years ago but had 1 can of beer w/in the last month and went to AA after argument with his daughter. He c/o marital issues with wife.  3. Reviewed labs  4. Right ankle pain x 20 years with brace on today limits walking    Review of Systems  Constitutional: Negative for weight loss.  HENT: Negative for hearing loss.   Eyes: Negative for blurred vision.  Respiratory: Negative for cough.   Cardiovascular: Negative for chest pain.  Gastrointestinal: Negative for vomiting.  Musculoskeletal: Positive for joint pain.  Skin: Negative for rash.  Neurological: Negative for headaches.  Psychiatric/Behavioral: Positive for depression and substance abuse. The patient is nervous/anxious and has insomnia.    Past Medical History:  Diagnosis Date  . Adenomatous colon polyp    tubular  . Adenomatous rectal polyp    tubular  . Asthma   . Depression   . Diverticulosis   . GERD (gastroesophageal reflux disease)   . History of chicken pox   . Hypertension   . Lung nodule    CT 10/2014  . Sleep apnea    wears cpap   Past Surgical History:  Procedure Laterality Date  . COLONOSCOPY    . COLONOSCOPY W/ POLYPECTOMY  2015   [facility], benign  . HERNIA REPAIR     inguinal  . POLYPECTOMY    . TONSILLECTOMY     age 84    Family History  Problem Relation Age of Onset  . Depression Mother   . Hypertension Father   . Heart disease Father   . Stroke Father   . Cancer Maternal Grandmother        ovarian?  . Cancer Paternal Grandfather        prostate  . Prostate cancer Paternal Grandfather   . Colon cancer Neg Hx   . Colon polyps Neg Hx     Social History   Socioeconomic History  . Marital status: Married    Spouse name: Not on file  . Number of children: Not on file  . Years of education: Not on file  . Highest education level: Not on file  Occupational History  . Not on file  Social Needs  . Financial resource strain: Not hard at all  . Food insecurity:    Worry: Never true    Inability: Never true  . Transportation needs:    Medical: No    Non-medical: No  Tobacco Use  . Smoking status: Never Smoker  . Smokeless tobacco: Never Used  Substance and Sexual Activity  . Alcohol use: No    Alcohol/week: 0.0 standard drinks    Comment: stopped drinking 20 years ago  . Drug use: No  . Sexual activity: Never  Lifestyle  . Physical activity:    Days per week: Not on file    Minutes per session: Not on file  . Stress: Not at all  Relationships  . Social connections:    Talks on phone: Not on file    Gets together: Not on file    Attends religious service: Not on file    Active member  of club or organization: Not on file    Attends meetings of clubs or organizations: Not on file    Relationship status: Not on file  . Intimate partner violence:    Fear of current or ex partner: No    Emotionally abused: No    Physically abused: No    Forced sexual activity: No  Other Topics Concern  . Not on file  Social History Narrative   Lives in [place] with wife, [name]. 1 daughter in [place].      Work - Optician, dispensing, now Animal nutritionist      From [employer]         Current Meds  Medication Sig  . albuterol (PROVENTIL HFA;VENTOLIN HFA) 108 (90 Base) MCG/ACT inhaler Inhale 1-2 puffs into the lungs every 6 (six) hours as needed for wheezing or shortness of breath.  Marland Kitchen amLODipine (NORVASC) 5 MG tablet Take 1 tablet (5 mg total) by mouth daily.  . Cholecalciferol 1.25 MG (50000 UT) capsule Take 1 capsule (50,000 Units total) by mouth once a week.  . dicyclomine (BENTYL) 10 MG capsule Take 1 capsule (10 mg  total) by mouth 2 (two) times daily.  . famotidine (PEPCID) 20 MG tablet Take 1 tablet (20 mg total) by mouth daily.  Marland Kitchen gabapentin (NEURONTIN) 600 MG tablet Take 1 tablet by mouth 3 times a day  . hyoscyamine (LEVSIN SL) 0.125 MG SL tablet Take 0.125 mg by mouth every 4 (four) hours as needed.  Marland Kitchen lisinopril-hydrochlorothiazide (PRINZIDE,ZESTORETIC) 20-12.5 MG tablet TAKE 2 TABLETS BY MOUTH DAILY  . lisinopril-hydrochlorothiazide (ZESTORETIC) 20-12.5 MG tablet Take 2 tablets by mouth daily.  Marland Kitchen LORazepam (ATIVAN) 0.5 MG tablet TAKE 1 TO 2 TABLETS BY MOUTH AT BEDTIME, DO NOT TAKE WITH AMBIEN  . nystatin cream (MYCOSTATIN) Apply 1 application topically 2 (two) times daily.  Marland Kitchen rOPINIRole (REQUIP) 2 MG tablet Take 3 tablets (6 mg total) by mouth at bedtime.  . trazodone (DESYREL) 300 MG tablet Take 1 tablet (300 mg total) by mouth at bedtime.  . [DISCONTINUED] atorvastatin (LIPITOR) 10 MG tablet Take 1 tablet (10 mg total) by mouth daily at 6 PM.  . [DISCONTINUED] Cholecalciferol 50000 units capsule Take 1 capsule (50,000 Units total) by mouth once a week.  . [DISCONTINUED] zolpidem (AMBIEN) 10 MG tablet Take 1 tablet (10 mg total) by mouth at bedtime as needed. for sleep   Allergies  Allergen Reactions  . Nsaids Other (See Comments)    GI Issues   Recent Results (from the past 2160 hour(s))  Comprehensive metabolic panel     Status: None   Collection Time: 06/28/18 10:48 AM  Result Value Ref Range   Sodium 140 135 - 145 mEq/L   Potassium 3.8 3.5 - 5.1 mEq/L   Chloride 98 96 - 112 mEq/L   CO2 32 19 - 32 mEq/L   Glucose, Bld 95 70 - 99 mg/dL   BUN 21 6 - 23 mg/dL   Creatinine, Ser 0.99 0.40 - 1.50 mg/dL   Total Bilirubin 0.5 0.2 - 1.2 mg/dL   Alkaline Phosphatase 56 39 - 117 U/L   AST 23 0 - 37 U/L   ALT 38 0 - 53 U/L   Total Protein 6.9 6.0 - 8.3 g/dL   Albumin 4.5 3.5 - 5.2 g/dL   Calcium 9.7 8.4 - 10.5 mg/dL   GFR 80.19 >60.00 mL/min  TSH     Status: None   Collection Time:  06/28/18 10:48 AM  Result Value Ref Range  TSH 2.88 0.35 - 4.50 uIU/mL  Lipid panel     Status: Abnormal   Collection Time: 06/28/18 10:48 AM  Result Value Ref Range   Cholesterol 197 0 - 200 mg/dL    Comment: ATP III Classification       Desirable:  < 200 mg/dL               Borderline High:  200 - 239 mg/dL          High:  > = 240 mg/dL   Triglycerides 163.0 (H) 0.0 - 149.0 mg/dL    Comment: Normal:  <150 mg/dLBorderline High:  150 - 199 mg/dL   HDL 54.40 >39.00 mg/dL   VLDL 32.6 0.0 - 40.0 mg/dL   LDL Cholesterol 110 (H) 0 - 99 mg/dL   Total CHOL/HDL Ratio 4     Comment:                Men          Women1/2 Average Risk     3.4          3.3Average Risk          5.0          4.42X Average Risk          9.6          7.13X Average Risk          15.0          11.0                       NonHDL 142.46     Comment: NOTE:  Non-HDL goal should be 30 mg/dL higher than patient's LDL goal (i.e. LDL goal of < 70 mg/dL, would have non-HDL goal of < 100 mg/dL)  Hepatitis C antibody     Status: None   Collection Time: 06/28/18 10:48 AM  Result Value Ref Range   Hepatitis C Ab NON-REACTIVE NON-REACTI   SIGNAL TO CUT-OFF 0.21 <1.00    Comment: . HCV antibody was non-reactive. There is no laboratory  evidence of HCV infection. . In most cases, no further action is required. However, if recent HCV exposure is suspected, a test for HCV RNA (test code [phone]) is suggested. . For additional information please refer to http://education.questdiagnostics.com/faq/FAQ22v1 (This link is being provided for informational/ educational purposes only.) .   Vitamin D (25 hydroxy)     Status: Abnormal   Collection Time: 06/28/18 10:48 AM  Result Value Ref Range   VITD 22.92 (L) 30.00 - 100.00 ng/mL  Iron, TIBC and Ferritin Panel     Status: None   Collection Time: 06/28/18 10:48 AM  Result Value Ref Range   Iron 149 50 - 180 mcg/dL   TIBC 322 250 - 425 mcg/dL (calc)   %SAT 46 20 - 48 % (calc)   Ferritin  355 24 - 380 ng/mL  PSA     Status: None   Collection Time: 06/28/18 10:48 AM  Result Value Ref Range   PSA 0.74 0.10 - 4.00 ng/mL    Comment: Test performed using Access Hybritech PSA Assay, a parmagnetic partical, chemiluminecent immunoassay.  Hemoglobin A1c     Status: None   Collection Time: 06/28/18 10:48 AM  Result Value Ref Range   Hgb A1c MFr Bld 5.8 4.6 - 6.5 %    Comment: Glycemic Control Guidelines for People with Diabetes:Non Diabetic:  <6%Goal of Therapy: <7%Additional Action Suggested:  >8%  Urinalysis, Routine w reflex microscopic     Status: Abnormal   Collection Time: 06/28/18 10:48 AM  Result Value Ref Range   Color, Urine YELLOW Yellow;Lt. Yellow   APPearance CLEAR Clear   Specific Gravity, Urine 1.015 1.000 - 1.030   pH 6.0 5.0 - 8.0   Total Protein, Urine NEGATIVE Negative   Urine Glucose NEGATIVE Negative   Ketones, ur NEGATIVE Negative   Bilirubin Urine NEGATIVE Negative   Hgb urine dipstick TRACE-LYSED (A) Negative   Urobilinogen, UA 0.2 0.0 - 1.0   Leukocytes, UA NEGATIVE Negative   Nitrite NEGATIVE Negative   WBC, UA none seen 0-2/hpf   RBC / HPF none seen 0-2/hpf  Measles/Mumps/Rubella Immunity     Status: None   Collection Time: 06/28/18 10:48 AM  Result Value Ref Range   Rubeola IgG >300.00 AU/mL    Comment: AU/mL            Interpretation -----            -------------- <25.00           Negative 25.00-29.99      Equivocal >29.99           Positive . A positive result indicates that the patient has antibody to measles virus. It does not differentiate  between an active or past infection. The clinical  diagnosis must be interpreted in conjunction with  clinical signs and symptoms of the patient.    Mumps IgG >300.00 AU/mL    Comment:  AU/mL           Interpretation -------         ---------------- <9.00             Negative 9.00-10.99        Equivocal >10.99            Positive A positive result indicates that the patient has  antibody  to mumps virus. It does not differentiate between an  active or past infection. The clinical diagnosis must be interpreted in conjunction with clinical signs and symptoms of the patient. .    Rubella 26.20 index    Comment:     Index            Interpretation     -----            --------------       <0.90            Not consistent with Immunity     0.90-0.99        Equivocal     > or = 1.00      Consistent with Immunity  . The presence of rubella IgG antibody suggests  immunization or past or current infection with rubella virus.   TEST AUTHORIZATION     Status: None   Collection Time: 06/28/18 10:48 AM  Result Value Ref Range   TEST NAME: MMR (IGG) PANEL (MEASLES, MUM    TEST CODE: [id number]    CLIENT CONTACT: Cannon Kettle    REPORT ALWAYS MESSAGE SIGNATURE      Comment: . The laboratory testing on this patient was verbally requested or confirmed by the ordering physician or his or her authorized representative after contact with an employee of [employer]. Federal regulations require that we maintain on file written authorization for all laboratory testing.  Accordingly we are asking that the ordering physician or his or her authorized representative sign a copy of this report and promptly return it  to the client service representative. . . Signature:____________________________________________________ . Please fax this signed page to [phone] or return it via your [employer] courier.    Objective  Body mass index is 46.87 kg/m. Wt Readings from Last 3 Encounters:  09/06/18 290 lb 6.4 oz (131.7 kg)  06/28/18 290 lb 12.8 oz (131.9 kg)  04/04/18 287 lb 12.8 oz (130.5 kg)   Temp Readings from Last 3 Encounters:  09/06/18 98.3 F (36.8 C) (Oral)  06/28/18 98.3 F (36.8 C) (Oral)  04/04/18 98.3 F (36.8 C) (Oral)   BP Readings from Last 3 Encounters:  09/06/18 140/82  06/28/18 (!) 156/80  04/04/18 132/72   Pulse Readings from Last 3  Encounters:  09/06/18 100  06/28/18 75  04/04/18 71    Physical Exam  Constitutional: He is oriented to person, place, and time. Vital signs are normal. He appears well-developed and well-nourished. He is cooperative.  HENT:  Head: Normocephalic and atraumatic.  Mouth/Throat: Oropharynx is clear and moist and mucous membranes are normal.  Eyes: Pupils are equal, round, and reactive to light. Conjunctivae are normal.  Cardiovascular: Normal rate, regular rhythm and normal heart sounds.  Pulmonary/Chest: Effort normal and breath sounds normal.  Neurological: He is alert and oriented to person, place, and time. Gait normal.  Skin: Skin is warm, dry and intact.  Psychiatric: He has a normal mood and affect. His speech is normal and behavior is normal. Judgment and thought content normal. Cognition and memory are normal.  Nursing note and vitals reviewed.   Assessment   1. HTN/HLD 2. Anxiety/depression/inosmnia h/o alcohol abuse 3. Right ankle pain  4. HM Plan   1. Cont meds, d/c lipitor add zetia  2. Refer to psychiatry  Stop ambien 10 sleep walking  trazadone 300 mg qhs  Ativan 0.5 using sparingly  3. Xray  Refer to Dr. Laymond Purser  4. Flu utd tdap utd, prevnar, pna 23  Consider shingrix in future  Never smoker  Nl PSA consider DRE in future  Colonoscopy h/o multiple polyps due 04/12/2019 [facility]  Consider derm in future not needed now  MMR immune and hep C neg   Provider: Dr. [name] McLean-Scocuzza-Internal Medicine

## 2018-09-06 NOTE — Progress Notes (Signed)
Pre visit review using our clinic review tool, if applicable. No additional management support is needed unless otherwise documented below in the visit note. 

## 2018-09-07 NOTE — Telephone Encounter (Signed)
Both were sent and neurology was resent to Dr. Trena Platt office. I have also sent the referral to [facility].  Soledad noted in his last referral that the patient never returned their multiple phone calls so they closed it. I have resent it over to them since Dr. Nicolasa Ducking is out of network with his insurance. Thanks [employer]

## 2018-09-07 NOTE — Telephone Encounter (Signed)
Cancel neurology appt  Keep psych appt   Thanks tMS

## 2018-09-07 NOTE — Telephone Encounter (Signed)
Patient informed at appointment.

## 2018-09-11 ENCOUNTER — Inpatient Hospital Stay: Payer: PPO | Attending: Oncology

## 2018-09-17 ENCOUNTER — Telehealth: Payer: Self-pay | Admitting: Internal Medicine

## 2018-09-17 ENCOUNTER — Other Ambulatory Visit: Payer: Self-pay | Admitting: Pharmacist

## 2018-09-17 ENCOUNTER — Other Ambulatory Visit: Payer: Self-pay

## 2018-09-17 DIAGNOSIS — I10 Essential (primary) hypertension: Secondary | ICD-10-CM

## 2018-09-17 MED ORDER — LISINOPRIL-HYDROCHLOROTHIAZIDE 20-12.5 MG PO TABS: 2.0000 | ORAL_TABLET | Freq: Every day | ORAL | 2 refills | Status: DC

## 2018-09-17 NOTE — Patient Outreach (Signed)
[facility]) Care Management  09/17/2018  Paul Cook [birth year] [id number]   Incoming call from [name] in response to the [facility] Medication Adherence Campaign. Speak with patient. HIPAA identifiers verified and verbal consent received.  [name] reports that he takes his lisinopril-hydrochlorothiazide daily. States that he is currently at work so he is unable to check his bottle. Counsel patient on the importance of medication adherence and blood pressure control. Note that per dispensing history in chart, patient has been receiving only a 10 day supply of his medication for each of the past 3 refills. It appears that it was sent over to the pharmacy this way by the patient's PCP.  Will call to follow up with patient's PCP office to request that a 90 day supply prescription for the patient's lisinopril-hydrochlorothiazide be sent to the pharmacy.  Harlow Asa, PharmD, [place] Management [phone]

## 2018-09-17 NOTE — Telephone Encounter (Signed)
Copied from [place] [phone]. Topic: Quick Communication - See Telephone Encounter >> Sep 17, 2018  9:33 AM Ahmed Prima L wrote: CRM for notification. See Telephone encounter for: 09/17/18.  Elizabeth with [facility] called and said that his lisinopril-hydrochlorothiazide (PRINZIDE,ZESTORETIC) 20-12.5 MG tablet needs to be for 180 tablets instead of 20 tablets. She said that it looks like it was written for 20 tablets and then the other script lisinopril-hydrochlorothiazide (ZESTORETIC) 20-12.5 MG tablet was written for 180 tablets and that was sent to [facility]. Patient wants that sent to [place], [address] [place] [address]   Call back @ [phone] so she can follow up with the patient.

## 2018-09-19 ENCOUNTER — Other Ambulatory Visit: Payer: Self-pay | Admitting: Pharmacist

## 2018-09-19 NOTE — Patient Outreach (Signed)
[facility]) Care Management  09/19/2018  Kahlil Cowans [birth year] [id number]   Perform chart review and note that provider's office did send in the 90 day supply prescription for the patient'slisinopril-hydrochlorothiazide to his [place] on 09/17/18.  Call to follow up with patient regarding medication adherence. HIPAA identifiers verified.  Patient confirms that he is aware that he is to take the lisinopril-hydrochlorothiazide 20-12.5mg  - 2 tablet daily as directed. Reports that he may have been taking only 1 tablet daily at times due to forgetting that he was instructed to take the 2 tablets. Patient reports that he used to use a weekly pillbox, but that he has not recently been using it due to getting out of the habit of filling it. Counsel patient on the importance of medication adherence. Patient verbalizes understanding.   Counsel patient about the option of having his medications pill packaged to aid with ease of adherence. Patient expresses interest in this option. Provide patient with the names and phone numbers of three pharmacies in his area that perform this service. Patient reports that he will follow up directly with these pharmacies.  Patient denies further medication questions/concerns at this time. Provide patient with my phone number.  PLAN  1) Will call to follow up with patient regarding medication adherence/ pill packaging on 09/21/18 as requested.  Harlow Asa, PharmD, [place] Management [phone]

## 2018-09-19 NOTE — Patient Outreach (Signed)
[facility]) Care Management  09/19/2018  Paul Cook 02-Nov-1950 [id number]   Call to follow up with patient's PCP office to request that a 90 day supply prescription for the patient's lisinopril-hydrochlorothiazide be sent to the pharmacy. Leave a message with [name] in the office.  Harlow Asa, PharmD, [place] Management [phone]

## 2018-09-20 ENCOUNTER — Inpatient Hospital Stay: Payer: PPO | Attending: Oncology | Admitting: Oncology

## 2018-09-21 ENCOUNTER — Other Ambulatory Visit: Payer: Self-pay | Admitting: Pharmacist

## 2018-09-21 NOTE — Patient Outreach (Signed)
[name]) Care Management  09/21/2018  Paul Cook 1951/03/15 [id number]   Call to follow up with patient as requested regarding pill packaging. HIPAA identifiers verified and verbal consent received.  Patient reports that he has decided to start back filling his weekly pillbox himself rather than moving to a pill packaging pharmacy at this time.   Patient denies any further medication questions/concerns at this time. Patient confirms that he has my phone number.  PLAN  Will close pharmacy episode.  Harlow Asa, PharmD, [place] [facility] [phone]

## 2018-09-27 ENCOUNTER — Ambulatory Visit: Payer: Self-pay | Admitting: Internal Medicine

## 2018-10-01 ENCOUNTER — Other Ambulatory Visit: Payer: Self-pay | Admitting: Internal Medicine

## 2018-10-01 MED ORDER — TRAZODONE HCL 300 MG PO TABS: 300.0000 mg | ORAL_TABLET | Freq: Every day | ORAL | 2 refills | Status: DC

## 2018-10-02 ENCOUNTER — Ambulatory Visit: Payer: Self-pay | Admitting: Psychology

## 2018-10-15 ENCOUNTER — Ambulatory Visit (INDEPENDENT_AMBULATORY_CARE_PROVIDER_SITE_OTHER): Payer: PPO | Admitting: Psychology

## 2018-10-15 DIAGNOSIS — F4323 Adjustment disorder with mixed anxiety and depressed mood: Secondary | ICD-10-CM

## 2018-11-09 ENCOUNTER — Ambulatory Visit (INDEPENDENT_AMBULATORY_CARE_PROVIDER_SITE_OTHER): Payer: PPO | Admitting: Internal Medicine

## 2018-11-09 ENCOUNTER — Encounter: Payer: Self-pay | Admitting: Internal Medicine

## 2018-11-09 VITALS — BP 158/90 | HR 89 | Temp 98.6°F | Resp 16 | Ht 66.0 in | Wt 298.0 lb

## 2018-11-09 DIAGNOSIS — E785 Hyperlipidemia, unspecified: Secondary | ICD-10-CM | POA: Diagnosis not present

## 2018-11-09 DIAGNOSIS — F419 Anxiety disorder, unspecified: Secondary | ICD-10-CM

## 2018-11-09 DIAGNOSIS — G4733 Obstructive sleep apnea (adult) (pediatric): Secondary | ICD-10-CM | POA: Diagnosis not present

## 2018-11-09 DIAGNOSIS — F329 Major depressive disorder, single episode, unspecified: Secondary | ICD-10-CM | POA: Diagnosis not present

## 2018-11-09 DIAGNOSIS — F439 Reaction to severe stress, unspecified: Secondary | ICD-10-CM | POA: Diagnosis not present

## 2018-11-09 DIAGNOSIS — I10 Essential (primary) hypertension: Secondary | ICD-10-CM

## 2018-11-09 DIAGNOSIS — G4700 Insomnia, unspecified: Secondary | ICD-10-CM

## 2018-11-09 DIAGNOSIS — F32A Depression, unspecified: Secondary | ICD-10-CM

## 2018-11-09 MED ORDER — SILDENAFIL CITRATE 20 MG PO TABS: ORAL_TABLET | ORAL | 11 refills | Status: DC

## 2018-11-09 MED ORDER — LORAZEPAM 0.5 MG PO TABS: ORAL_TABLET | ORAL | 2 refills | Status: DC

## 2018-11-09 NOTE — Progress Notes (Addendum)
Chief Complaint  Patient presents with  . Follow-up    Discuss new medicine zolpidem   F/u  1. HTN uncontrolled [facility] with meds did not take today norvasc 5 mg qd and zestoretic 20-12.5 taking 1 instead of 2 and not using cpap  2. Anxiety/stress/depression/ and marital issues and insomnia -has not seen psych yet and sleep is interrupted and less  3. Chronic ankle pain did not see podiatry 2/2 cost  4. OSA on cpap not really using as he should  5. C/o Ed wants viagra  Review of Systems  Constitutional: Negative for weight loss.  HENT: Negative for hearing loss.   Eyes: Negative for blurred vision.  Respiratory: Negative for shortness of breath.   Cardiovascular: Negative for chest pain.  Musculoskeletal: Negative for falls and joint pain.  Skin: Negative for rash.  Neurological: Negative for headaches.  Psychiatric/Behavioral: Positive for depression. The patient is nervous/anxious and has insomnia.    Past Medical History:  Diagnosis Date  . Adenomatous colon polyp    tubular  . Adenomatous rectal polyp    tubular  . Asthma   . Depression   . Diverticulosis   . GERD (gastroesophageal reflux disease)   . History of chicken pox   . Hypertension   . Lung nodule    CT 10/2014  . Sleep apnea    wears cpap   Past Surgical History:  Procedure Laterality Date  . COLONOSCOPY    . COLONOSCOPY W/ POLYPECTOMY  2015   [facility], benign  . HERNIA REPAIR     inguinal  . POLYPECTOMY    . TONSILLECTOMY     age 89    Family History  Problem Relation Age of Onset  . Depression Mother   . Hypertension Father   . Heart disease Father   . Stroke Father   . Cancer Maternal Grandmother        ovarian?  . Cancer Paternal Grandfather        prostate  . Prostate cancer Paternal Grandfather   . Colon cancer Neg Hx   . Colon polyps Neg Hx    Social History   Socioeconomic History  . Marital status: Married    Spouse name: Not on file  . Number of children: Not on file  . Years of  education: Not on file  . Highest education level: Not on file  Occupational History  . Not on file  Social Needs  . Financial resource strain: Not hard at all  . Food insecurity:    Worry: Never true    Inability: Never true  . Transportation needs:    Medical: No    Non-medical: No  Tobacco Use  . Smoking status: Never Smoker  . Smokeless tobacco: Never Used  Substance and Sexual Activity  . Alcohol use: No    Alcohol/week: 0.0 standard drinks    Comment: stopped drinking 20 years ago  . Drug use: No  . Sexual activity: Never  Lifestyle  . Physical activity:    Days per week: Not on file    Minutes per session: Not on file  . Stress: Not at all  Relationships  . Social connections:    Talks on phone: Not on file    Gets together: Not on file    Attends religious service: Not on file    Active member of club or organization: Not on file    Attends meetings of clubs or organizations: Not on file    Relationship status:  Not on file  . Intimate partner violence:    Fear of current or ex partner: No    Emotionally abused: No    Physically abused: No    Forced sexual activity: No  Other Topics Concern  . Not on file  Social History Narrative   Lives in [place] with wife, [name]. 1 daughter in [place].      Work - Optician, dispensing, now Animal nutritionist      From [employer]         Current Meds  Medication Sig  . albuterol (PROVENTIL HFA;VENTOLIN HFA) 108 (90 Base) MCG/ACT inhaler Inhale 1-2 puffs into the lungs every 6 (six) hours as needed for wheezing or shortness of breath.  Marland Kitchen amLODipine (NORVASC) 5 MG tablet Take 1 tablet (5 mg total) by mouth daily.  . Cholecalciferol 1.25 MG (50000 UT) capsule Take 1 capsule (50,000 Units total) by mouth once a week.  . dicyclomine (BENTYL) 10 MG capsule Take 1 capsule (10 mg total) by mouth 2 (two) times daily.  Marland Kitchen ezetimibe (ZETIA) 10 MG tablet Take 1 tablet (10 mg total) by mouth daily.  . famotidine (PEPCID) 20 MG tablet  Take 1 tablet (20 mg total) by mouth daily.  Marland Kitchen gabapentin (NEURONTIN) 600 MG tablet Take 1 tablet by mouth 3 times a day  . hyoscyamine (LEVSIN SL) 0.125 MG SL tablet Take 0.125 mg by mouth every 4 (four) hours as needed.  Marland Kitchen lisinopril-hydrochlorothiazide (ZESTORETIC) 20-12.5 MG tablet Take 2 tablets by mouth daily.  Marland Kitchen LORazepam (ATIVAN) 0.5 MG tablet TAKE 1 TO 2 TABLETS BY MOUTH AT BEDTIME, DO NOT TAKE WITH AMBIEN  . nystatin cream (MYCOSTATIN) Apply 1 application topically 2 (two) times daily.  Marland Kitchen rOPINIRole (REQUIP) 2 MG tablet Take 3 tablets (6 mg total) by mouth at bedtime.  . trazodone (DESYREL) 300 MG tablet Take 1 tablet (300 mg total) by mouth at bedtime.   Allergies  Allergen Reactions  . Nsaids Other (See Comments)    GI Issues   No results found for this or any previous visit (from the past 2160 hour(s)). Objective  Body mass index is 48.1 kg/m. Wt Readings from Last 3 Encounters:  11/09/18 298 lb (135.2 kg)  09/06/18 290 lb 6.4 oz (131.7 kg)  06/28/18 290 lb 12.8 oz (131.9 kg)   Temp Readings from Last 3 Encounters:  11/09/18 98.6 F (37 C) (Oral)  09/06/18 98.3 F (36.8 C) (Oral)  06/28/18 98.3 F (36.8 C) (Oral)   BP Readings from Last 3 Encounters:  11/09/18 (!) 158/90  09/06/18 140/82  06/28/18 (!) 156/80   Pulse Readings from Last 3 Encounters:  11/09/18 89  09/06/18 100  06/28/18 75    Physical Exam Vitals signs and nursing note reviewed.  Constitutional:      Appearance: Normal appearance. He is well-developed and well-groomed.  HENT:     Head: Normocephalic and atraumatic.     Nose: Nose normal.     Mouth/Throat:     Mouth: Mucous membranes are moist.     Pharynx: Oropharynx is clear.  Eyes:     Conjunctiva/sclera: Conjunctivae normal.     Pupils: Pupils are equal, round, and reactive to light.  Cardiovascular:     Rate and Rhythm: Normal rate and regular rhythm.     Heart sounds: Normal heart sounds.  Pulmonary:     Effort: Pulmonary  effort is normal.     Breath sounds: Normal breath sounds.  Skin:    General: Skin is  warm and dry.  Neurological:     General: No focal deficit present.     Mental Status: He is alert and oriented to person, place, and time.     Gait: Gait normal.  Psychiatric:        Attention and Perception: Attention and perception normal.        Mood and Affect: Mood and affect normal.        Speech: Speech normal.        Behavior: Behavior normal. Behavior is cooperative.        Thought Content: Thought content normal.        Cognition and Memory: Cognition and memory normal.        Judgment: Judgment normal.     Assessment   1. HTN/HLD 2. Anxiety/depression/insomnia/sleep/memory issues Saw Dr. Nicolasa Ducking 12/02/2018 zoloft 50 mg, trazadone 300 mg qhs stop ativan  3. OSA 4. HM 5. ed Plan   1. Monitor rec take meds cont meds  Recheck at f/u  rec use cpap will need repeat sleep study 11/2018 pt wants to wait  2. Referred urgently to [facility] hold ambien and will await psych recs  3. rec use cpap intermittently using repeat sleep study due 11/2018  Hold referral for now  4.  Flu utd tdap utd, prevnar, pna 23  Consider shingrix in future  Never smoker  Nl PSA consider DRE in future psa 0.74 06/28/18  Colonoscopy h/o multiple polyps due 04/12/2019 [facility]  Consider derm in future not needed now  MMR immune and hep C neg   5. Prn viagra Provider: Dr. [name] [facility]

## 2018-11-09 NOTE — Addendum Note (Signed)
Addended by: Orland Mustard on: 11/09/2018 01:40 PM   Modules accepted: Orders

## 2018-11-09 NOTE — Patient Instructions (Addendum)
After 02/2019 take D3 5000 IU daily  You can try melatonin 5 mg at night for sleep  Please f/u psychiatry call [phone]    [place] stands for "Dietary Approaches to Stop Hypertension." The DASH eating plan is a healthy eating plan that has been shown to reduce high blood pressure (hypertension). It may also reduce your risk for type 2 diabetes, heart disease, and stroke. The DASH eating plan may also help with weight loss. What are tips for following this plan?  General guidelines  Avoid eating more than 2,300 mg (milligrams) of salt (sodium) a day. If you have hypertension, you may need to reduce your sodium intake to 1,500 mg a day.  Limit alcohol intake to no more than 1 drink a day for nonpregnant women and 2 drinks a day for men. One drink equals 12 oz of beer, 5 oz of wine, or 1 oz of hard liquor.  Work with your health care provider to maintain a healthy body weight or to lose weight. Ask what an ideal weight is for you.  Get at least 30 minutes of exercise that causes your heart to beat faster (aerobic exercise) most days of the week. Activities may include walking, swimming, or biking.  Work with your health care provider or diet and nutrition specialist (dietitian) to adjust your eating plan to your individual calorie needs. Reading food labels   Check food labels for the amount of sodium per serving. Choose foods with less than 5 percent of the Daily Value of sodium. Generally, foods with less than 300 mg of sodium per serving fit into this eating plan.  To find whole grains, look for the word "whole" as the first word in the ingredient list. Shopping  Buy products labeled as "low-sodium" or "no salt added."  Buy fresh foods. Avoid canned foods and premade or frozen meals. Cooking  Avoid adding salt when cooking. Use salt-free seasonings or herbs instead of table salt or sea salt. Check with your health care provider or pharmacist before using salt  substitutes.  Do not fry foods. Cook foods using healthy methods such as baking, boiling, grilling, and broiling instead.  Cook with heart-healthy oils, such as olive, canola, soybean, or sunflower oil. Meal planning  Eat a balanced diet that includes: ? 5 or more servings of fruits and vegetables each day. At each meal, try to fill half of your plate with fruits and vegetables. ? Up to 6-8 servings of whole grains each day. ? Less than 6 oz of lean meat, poultry, or fish each day. A 3-oz serving of meat is about the same size as a deck of cards. One egg equals 1 oz. ? 2 servings of low-fat dairy each day. ? A serving of nuts, seeds, or beans 5 times each week. ? Heart-healthy fats. Healthy fats called Omega-3 fatty acids are found in foods such as flaxseeds and coldwater fish, like sardines, salmon, and mackerel.  Limit how much you eat of the following: ? Canned or prepackaged foods. ? Food that is high in trans fat, such as fried foods. ? Food that is high in saturated fat, such as fatty meat. ? Sweets, desserts, sugary drinks, and other foods with added sugar. ? Full-fat dairy products.  Do not salt foods before eating.  Try to eat at least 2 vegetarian meals each week.  Eat more home-cooked food and less restaurant, buffet, and fast food.  When eating at a restaurant, ask that your  food be prepared with less salt or no salt, if possible. What foods are recommended? The items listed may not be a complete list. Talk with your dietitian about what dietary choices are best for you. Grains Whole-grain or whole-wheat bread. Whole-grain or whole-wheat pasta. Brown rice. [place]ena Morrow. Bulgur. Whole-grain and low-sodium cereals. Pita bread. Low-fat, low-sodium crackers. Whole-wheat flour tortillas. Vegetables Fresh or frozen vegetables (raw, steamed, roasted, or grilled). Low-sodium or reduced-sodium tomato and vegetable juice. Low-sodium or reduced-sodium tomato sauce and tomato  paste. Low-sodium or reduced-sodium canned vegetables. Fruits All fresh, dried, or frozen fruit. Canned fruit in natural juice (without added sugar). Meat and other protein foods Skinless chicken or [place]. Ground chicken or [place]. Pork with fat trimmed off. Fish and seafood. Egg whites. Dried beans, peas, or lentils. Unsalted nuts, nut butters, and seeds. Unsalted canned beans. Lean cuts of beef with fat trimmed off. Low-sodium, lean deli meat. Dairy Low-fat (1%) or fat-free (skim) milk. Fat-free, low-fat, or reduced-fat cheeses. Nonfat, low-sodium ricotta or cottage cheese. Low-fat or nonfat yogurt. Low-fat, low-sodium cheese. Fats and oils Soft margarine without trans fats. Vegetable oil. Low-fat, reduced-fat, or light mayonnaise and salad dressings (reduced-sodium). Canola, safflower, olive, soybean, and sunflower oils. Avocado. Seasoning and other foods Herbs. Spices. Seasoning mixes without salt. Unsalted popcorn and pretzels. Fat-free sweets. What foods are not recommended? The items listed may not be a complete list. Talk with your dietitian about what dietary choices are best for you. Grains Baked goods made with fat, such as croissants, muffins, or some breads. Dry pasta or rice meal packs. Vegetables Creamed or fried vegetables. Vegetables in a cheese sauce. Regular canned vegetables (not low-sodium or reduced-sodium). Regular canned tomato sauce and paste (not low-sodium or reduced-sodium). Regular tomato and vegetable juice (not low-sodium or reduced-sodium). Angie Fava. Olives. Fruits Canned fruit in a light or heavy syrup. Fried fruit. Fruit in cream or butter sauce. Meat and other protein foods Fatty cuts of meat. Ribs. Fried meat. Berniece Salines. Sausage. Bologna and other processed lunch meats. Salami. Fatback. Hotdogs. Bratwurst. Salted nuts and seeds. Canned beans with added salt. Canned or smoked fish. Whole eggs or egg yolks. Chicken or [place] with skin. Dairy Whole or 2% milk,  cream, and half-and-half. Whole or full-fat cream cheese. Whole-fat or sweetened yogurt. Full-fat cheese. Nondairy creamers. Whipped toppings. Processed cheese and cheese spreads. Fats and oils Butter. Stick margarine. Lard. Shortening. Ghee. Bacon fat. Tropical oils, such as coconut, palm kernel, or palm oil. Seasoning and other foods Salted popcorn and pretzels. Onion salt, garlic salt, seasoned salt, table salt, and sea salt. Worcestershire sauce. Tartar sauce. Barbecue sauce. Teriyaki sauce. Soy sauce, including reduced-sodium. Steak sauce. Canned and packaged gravies. Fish sauce. Oyster sauce. Cocktail sauce. Horseradish that you find on the shelf. Ketchup. Mustard. Meat flavorings and tenderizers. Bouillon cubes. Hot sauce and Tabasco sauce. Premade or packaged marinades. Premade or packaged taco seasonings. Relishes. Regular salad dressings. Where to find more information:  National Heart, Lung, and [place]: https://wilson-eaton.com/  American Heart Association: www.heart.org Summary  The DASH eating plan is a healthy eating plan that has been shown to reduce high blood pressure (hypertension). It may also reduce your risk for type 2 diabetes, heart disease, and stroke.  With the DASH eating plan, you should limit salt (sodium) intake to 2,300 mg a day. If you have hypertension, you may need to reduce your sodium intake to 1,500 mg a day.  When on the DASH eating plan, aim to eat more fresh fruits and vegetables,  whole grains, lean proteins, low-fat dairy, and heart-healthy fats.  Work with your health care provider or diet and nutrition specialist (dietitian) to adjust your eating plan to your individual calorie needs. This information is not intended to replace advice given to you by your health care provider. Make sure you discuss any questions you have with your health care provider. Document Released: 09/22/2011 Document Revised: 09/26/2016 Document Reviewed: 09/26/2016 Elsevier  Interactive Patient Education  2019 Elsevier Inc.  Sleep Apnea Sleep apnea is a condition in which breathing pauses or becomes shallow during sleep. Episodes of sleep apnea usually last 10 seconds or longer, and they may occur as many as 20 times an hour. Sleep apnea disrupts your sleep and keeps your body from getting the rest that it needs. This condition can increase your risk of certain health problems, including:  Heart attack.  Stroke.  Obesity.  Diabetes.  Heart failure.  Irregular heartbeat. There are three kinds of sleep apnea:  Obstructive sleep apnea. This kind is caused by a blocked or collapsed airway.  Central sleep apnea. This kind happens when the part of the brain that controls breathing does not send the correct signals to the muscles that control breathing.  Mixed sleep apnea. This is a combination of obstructive and central sleep apnea. What are the causes? The most common cause of this condition is a collapsed or blocked airway. An airway can collapse or become blocked if:  Your throat muscles are abnormally relaxed.  Your tongue and tonsils are larger than normal.  You are overweight.  Your airway is smaller than normal. What increases the risk? This condition is more likely to develop in people who:  Are overweight.  Smoke.  Have a smaller than normal airway.  Are elderly.  Are male.  Drink alcohol.  Take sedatives or tranquilizers.  Have a family history of sleep apnea. What are the signs or symptoms? Symptoms of this condition include:  Trouble staying asleep.  Daytime sleepiness and tiredness.  Irritability.  Loud snoring.  Morning headaches.  Trouble concentrating.  Forgetfulness.  Decreased interest in sex.  Unexplained sleepiness.  Mood swings.  Personality changes.  Feelings of depression.  Waking up often during the night to urinate.  Dry mouth.  Sore throat. How is this diagnosed? This condition may  be diagnosed with:  A medical history.  A physical exam.  A series of tests that are done while you are sleeping (sleep study). These tests are usually done in a sleep lab, but they may also be done at home. How is this treated? Treatment for this condition aims to restore normal breathing and to ease symptoms during sleep. It may involve managing health issues that can affect breathing, such as high blood pressure or obesity. Treatment may include:  Sleeping on your side.  Using a decongestant if you have nasal congestion.  Avoiding the use of depressants, including alcohol, sedatives, and narcotics.  Losing weight if you are overweight.  Making changes to your diet.  Quitting smoking.  Using a device to open your airway while you sleep, such as: ? An oral appliance. This is a custom-made mouthpiece that shifts your lower jaw forward. ? A continuous positive airway pressure (CPAP) device. This device delivers oxygen to your airway through a mask. ? A nasal expiratory positive airway pressure (EPAP) device. This device has valves that you put into each nostril. ? A bi-level positive airway pressure (BPAP) device. This device delivers oxygen to your airway through a  mask.  Surgery if other treatments do not work. During surgery, excess tissue is removed to create a wider airway. It is important to get treatment for sleep apnea. Without treatment, this condition can lead to:  High blood pressure.  Coronary artery disease.  (Men) An inability to achieve or maintain an erection (impotence).  Reduced thinking abilities. Follow these instructions at home:  Make any lifestyle changes that your health care provider recommends.  Eat a healthy, well-balanced diet.  Take over-the-counter and prescription medicines only as told by your health care provider.  Avoid using depressants, including alcohol, sedatives, and narcotics.  Take steps to lose weight if you are  overweight.  If you were given a device to open your airway while you sleep, use it only as told by your health care provider.  Do not use any tobacco products, such as cigarettes, chewing tobacco, and e-cigarettes. If you need help quitting, ask your health care provider.  Keep all follow-up visits as told by your health care provider. This is important. Contact a health care provider if:  The device that you received to open your airway during sleep is uncomfortable or does not seem to be working.  Your symptoms do not improve.  Your symptoms get worse. Get help right away if:  You develop chest pain.  You develop shortness of breath.  You develop discomfort in your back, arms, or stomach.  You have trouble speaking.  You have weakness on one side of your body.  You have drooping in your face. These symptoms may represent a serious problem that is an emergency. Do not wait to see if the symptoms will go away. Get medical help right away. Call your local emergency services (911 in the [place] Do not drive yourself to the hospital. This information is not intended to replace advice given to you by your health care provider. Make sure you discuss any questions you have with your health care provider. Document Released: 09/23/2002 Document Revised: 05/01/2017 Document Reviewed: 07/13/2015 Elsevier Interactive Patient Education  2019 [employer].

## 2018-11-12 ENCOUNTER — Ambulatory Visit: Payer: PPO | Admitting: Psychology

## 2018-11-12 NOTE — Progress Notes (Signed)
Contacted patient and informed him of the email that we received. He states that he does not understand why they can never reach him since we reach him every time. He was given the number to call to schedule and said that he would call them asap. I have re-opened the referral that was placed. Thanks, [employer]

## 2018-11-20 ENCOUNTER — Other Ambulatory Visit: Payer: Self-pay

## 2018-11-20 ENCOUNTER — Other Ambulatory Visit: Payer: Self-pay | Admitting: Internal Medicine

## 2018-11-20 DIAGNOSIS — G4700 Insomnia, unspecified: Secondary | ICD-10-CM

## 2018-11-20 MED ORDER — TRAZODONE HCL 300 MG PO TABS: 300.0000 mg | ORAL_TABLET | Freq: Every day | ORAL | 2 refills | Status: DC

## 2018-11-20 NOTE — Patient Outreach (Signed)
[facility]) Care Management  11/20/2018  Paul Cook 29-Aug-1951 [id number]   Medication Adherence call to [name] left a message for patient to call back patient is due on Lisisnopril/HCTZ 20/12.5 mg under [employer].   [facility] [facility] Management Direct Dial [phone]  Fax [phone] [email or web address]

## 2018-11-26 ENCOUNTER — Ambulatory Visit: Payer: PPO | Admitting: Psychology

## 2018-11-29 ENCOUNTER — Other Ambulatory Visit: Payer: Self-pay

## 2018-11-29 NOTE — Patient Outreach (Signed)
[facility]) Care Management  11/29/2018  Paul Cook October 03, 1951 [id number]   Medication Adherence call to [name] Telephone call to Patient regarding Medication Adherence unable to reach patient HIPPA Compliant Voice message left with a call back number.    [facility] Management Direct Dial [phone]  Fax [phone] [email or web address]

## 2018-11-30 ENCOUNTER — Telehealth: Payer: Self-pay

## 2018-11-30 DIAGNOSIS — F321 Major depressive disorder, single episode, moderate: Secondary | ICD-10-CM | POA: Diagnosis not present

## 2018-11-30 DIAGNOSIS — F5105 Insomnia due to other mental disorder: Secondary | ICD-10-CM | POA: Diagnosis not present

## 2018-11-30 NOTE — Telephone Encounter (Signed)
Dr. Waylan Boga office called requesting last office notes and most recent labs.  FAXED as requested to Dr. Waylan Boga office @ [phone].

## 2018-12-04 ENCOUNTER — Ambulatory Visit (INDEPENDENT_AMBULATORY_CARE_PROVIDER_SITE_OTHER): Payer: PPO | Admitting: Psychology

## 2018-12-04 DIAGNOSIS — F4323 Adjustment disorder with mixed anxiety and depressed mood: Secondary | ICD-10-CM

## 2018-12-04 MED ORDER — SERTRALINE HCL 50 MG PO TABS: 50.0000 mg | ORAL_TABLET | Freq: Every day | ORAL | Status: DC

## 2018-12-04 NOTE — Addendum Note (Signed)
Addended by: Orland Mustard on: 12/04/2018 12:44 PM   Modules accepted: Orders

## 2019-01-01 ENCOUNTER — Ambulatory Visit: Payer: Self-pay | Admitting: Psychology

## 2019-02-03 ENCOUNTER — Other Ambulatory Visit: Payer: Self-pay | Admitting: Family Medicine

## 2019-02-15 ENCOUNTER — Telehealth: Payer: Self-pay | Admitting: Internal Medicine

## 2019-02-15 NOTE — Telephone Encounter (Signed)
Patient called back and I informed him that per the provide he is to stop taking Vitamin D weekly by Rx and change to 5000 units OVC daily. Pt understood.  Nina,cma

## 2019-02-15 NOTE — Telephone Encounter (Signed)
Call pt can d/c D3 50K weekly and take 5000 IU D3 daily over the counter   [place]

## 2019-02-15 NOTE — Telephone Encounter (Signed)
Lmtcb.  ,cma

## 2019-02-19 ENCOUNTER — Other Ambulatory Visit: Payer: Self-pay | Admitting: Internal Medicine

## 2019-02-19 DIAGNOSIS — I10 Essential (primary) hypertension: Secondary | ICD-10-CM

## 2019-02-19 MED ORDER — AMLODIPINE BESYLATE 5 MG PO TABS: 5.0000 mg | ORAL_TABLET | Freq: Every day | ORAL | 3 refills | Status: DC

## 2019-03-12 ENCOUNTER — Other Ambulatory Visit: Payer: Self-pay | Admitting: Internal Medicine

## 2019-03-12 DIAGNOSIS — G4700 Insomnia, unspecified: Secondary | ICD-10-CM

## 2019-03-12 DIAGNOSIS — F32A Depression, unspecified: Secondary | ICD-10-CM

## 2019-03-12 DIAGNOSIS — F419 Anxiety disorder, unspecified: Secondary | ICD-10-CM

## 2019-03-12 DIAGNOSIS — F439 Reaction to severe stress, unspecified: Secondary | ICD-10-CM

## 2019-03-12 DIAGNOSIS — F329 Major depressive disorder, single episode, unspecified: Secondary | ICD-10-CM

## 2019-03-12 MED ORDER — TRAZODONE HCL 300 MG PO TABS: 300.0000 mg | ORAL_TABLET | Freq: Every day | ORAL | 3 refills | Status: DC

## 2019-03-20 ENCOUNTER — Ambulatory Visit (HOSPITAL_COMMUNITY): Payer: PPO | Admitting: Licensed Clinical Social Worker

## 2019-03-27 ENCOUNTER — Ambulatory Visit (INDEPENDENT_AMBULATORY_CARE_PROVIDER_SITE_OTHER): Payer: PPO | Admitting: Licensed Clinical Social Worker

## 2019-03-27 DIAGNOSIS — F411 Generalized anxiety disorder: Secondary | ICD-10-CM | POA: Diagnosis not present

## 2019-03-27 NOTE — Progress Notes (Signed)
Comprehensive Clinical Assessment (CCA) Note  03/27/2019 Paul Cook [id number]    Virtual Visit via Video Note  I connected with Paul Cook on 03/27/19 at  2:00 PM EDT by a video enabled telemedicine application and verified that I am speaking with the correct person using two identifiers.   I discussed the limitations of evaluation and management by telemedicine and the availability of in person appointments. The patient expressed understanding and agreed to proceed.  I discussed the assessment and treatment plan with the patient. The patient was provided an opportunity to ask questions and all were answered. The patient agreed with the plan and demonstrated an understanding of the instructions.   The patient was advised to call back or seek an in-person evaluation if the symptoms worsen or if the condition fails to improve as anticipated.  I provided 56 minutes of non-face-to-face time during this encounter.   Paul Fragmin, LCSW   Visit Diagnosis:      ICD-10-CM   1. Generalized anxiety disorder F41.1       CCA Part One  Part One has been completed on paper by the patient.  (See scanned document in Chart Review)  CCA Part Two A  Intake/Chief Complaint:  CCA Intake With Chief Complaint CCA Part Two Date: 03/27/19 CCA Part Two Time: 1404 Chief Complaint/Presenting Problem: anxiety, separating from Paul Cook but still living with her Patients Currently Reported Symptoms/Problems: gets anxious when has to make decisions, cannot concentrate, cannot stop thinking about how hard it will be to leave relationship behind Collateral Involvement: brother in [place], brother in [place] are main supports, Paul Cook is now like a sister to him and is a support Individual's Strengths: intelligent, resilient Type of Services Patient Feels Are Needed: counseling  Initial Clinical Notes/Concerns: Paul Cook says she is no longer in love with him and has a boyfriend in [place] she has gone to visit  him twice, the first time he was depressed and saw a therapist to work through it   Patient Report: Patient states that he has worked through much of his depression over separation with Paul Cook, but is now dealing with anxiety and worry over rebuilding his life without that relationship.He reports that he also has some anxiety over upcoming trip to [place], his daughter in [place], and family relationships with his brothers. He denies any physical sensations of anxiety, but states he worries a lot and cannot get things off his mid. Patient shares that he would like to able to motivate himself better and not feel so stuck in anxiety every time a stressful decision or situation arises.  Mental Health Symptoms Depression:  Depression: Difficulty Concentrating, Change in energy/activity, Sleep (too much or little), Irritability(struggles with motivation but feels good once he starts doing things)  Mania:  Mania: N/A  Anxiety:   Anxiety: Worrying, Restlessness, Difficulty concentrating  Psychosis:  Psychosis: N/A  Trauma:  Trauma: N/A  Obsessions:  Obsessions: N/A  Compulsions:  Compulsions: N/A  Inattention:  Inattention: N/A  Hyperactivity/Impulsivity:  Hyperactivity/Impulsivity: N/A  Oppositional/Defiant Behaviors:  Oppositional/Defiant Behaviors: N/A  Borderline Personality:  Emotional Irregularity: N/A  Other Mood/Personality Symptoms:      Mental Status Exam Appearance and self-care  Stature:  Stature: (unknown - telephone session)  Weight:  Weight: (unknown - telephone session)  Clothing:  Clothing: (unknown - telephone session)  Grooming:  Grooming: (unknown - telephone session)  Cosmetic use:  Cosmetic Use: (unknown - telephone session)  Posture/gait:  Posture/Gait: (unknown - telephone session)  Motor activity:  Motor Activity: (  unknown - telephone session)  Sensorium  Attention:  Attention: Normal  Concentration:  Concentration: Anxiety interferes  Orientation:  Orientation: X5   Recall/memory:  Recall/Memory: Normal  Affect and Mood  Affect:  Affect: (unknown - telephone session)  Mood:   Appropritae  Relating  Eye contact:   Unknown - telephone session  Facial expression:    Unknown - telephone session  Attitude toward examiner:   Cooperative  Thought and Language  Speech flow:  Normal  Thought content:    Normal  Preoccupation:   None  Hallucinations:   None  Organization:   Normal  Transport planner of Knowledge:   Average  Intelligence:   Average  Abstraction:   Normal  Judgement:   Fair  Art therapist:   Intact  Insight:   Fair  Decision Making:   Fair  Social Functioning  Social Maturity:  Social Maturity: Isolates  Social Judgement:  Social Judgement: Normal  Stress  Stressors:  Stressors: Family conflict  Coping Ability:  Coping Ability: Deficient supports  Skill Deficits:   decision making  Supports:   brothers, but they are in [place] and [place], no one local   Family and Psychosocial History: Family history Marital status: Separated Separated, when?: 8-9 months What types of issues is patient dealing with in the relationship?: feels like he has moved on from being in love with Paul Cook, but he has a lot of stress and anxiety about the relationship ending Additional relationship information: still living with Paul Cook, she gets angry easily and "verbally aggressive" Are you sexually active?: No(stopped having sex with Paul Cook 6 years ago) What is your sexual orientation?: Straight Does patient have children?: Yes How many children?: 1 How is patient's relationship with their children?: daughter lives in [place], age 10, she has Schizophrenia,   Childhood History:  Childhood History By whom was/is the patient raised?: Both parents Additional childhood history information: grew up in [place], moved here in 2001, good childhood, went to [place] at age 87 for boarding school, moved to [place] in 1975 Description of patient's  relationship with caregiver when they were a child: good relationship with both, closer with mother because they had a lot in common, but spent a lot of time boating and fishing with father as well Patient's description of current relationship with people who raised him/her: both are deceased How were you disciplined when you got in trouble as a child/adolescent?: spankings, but never was too rough Does patient have siblings?: Yes Number of Siblings: 3 Description of patient's current relationship with siblings: brothers, good relationships with 2, when parents died one closest to him in age asked him & another brother to take his side in an argument and cut contact with the 3rd brother and when they did not he cut ties with them all Did patient suffer any verbal/emotional/physical/sexual abuse as a child?: No Did patient suffer from severe childhood neglect?: No Has patient ever been sexually abused/assaulted/raped as an adolescent or adult?: No Was the patient ever a victim of a crime or a disaster?: No Witnessed domestic violence?: No Has patient been effected by domestic violence as an adult?: No  CCA Part Two B  Employment/Work Situation: Employment / Work Situation Employment situation: Employed 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 Did You Receive Any Psychiatric Treatment/Services While in Passenger transport manager?: No Are There Guns or Other Weapons in Your Home?: No  **Has worked as a Presenter, broadcasting for 18 years, has a degree in political studies and  wanted to work in that field but had problems getting nationality paperwork and was unable to secure a job, then moved to [place]**  Education: Education Last Grade Completed: 16 Did Teacher, adult education From [employer]?: Yes Did Physicist, medical?: Yes What Type of College Degree Do you Have?: Bachelor's degree Did You Attend Graduate School?: No What Was Your Major?: Political Studies in [place] Did You Have An Individualized  Education Program (IIEP): No Did You Have Any Difficulty At [employer]?: No  Religion: Religion/Spirituality Are You A Religious Person?: No  Leisure/Recreation:    Exercise/Diet: Exercise/Diet Do You Exercise?: No Have You Gained or Lost A Significant Amount of Weight in the Past Six Months?: No Do You Follow a Special Diet?: No Do You Have Any Trouble Sleeping?: Yes Explanation of Sleeping Difficulties: can't get to sleep - takes Trazadone  CCA Part Two C  Alcohol/Drug Use: Alcohol / Drug Use History of alcohol / drug use?: Yes Substance #1 Name of Substance 1: alcohol 1 - Age of First Use: adolescence 1 - Duration: many years 1 - Last Use / Amount: 20 years ago Does not go to AA regularly, but was active when he stopped drinking & sometimes still attends meetings  CCA Part Three  ASAM's:  Six Dimensions of Multidimensional Assessment  Dimension 1:  Acute Intoxication and/or Withdrawal Potential:     Dimension 2:  Biomedical Conditions and Complications:     Dimension 3:  Emotional, Behavioral, or Cognitive Conditions and Complications:  Dimension 3:  Comments: anxiety and depression  Dimension 4:  Readiness to Change:     Dimension 5:  Relapse, Continued use, or Continued Problem Potential:     Dimension 6:  Recovery/Living Environment:  Dimension 6:  Recovery/Living Environment Comments: no local support   Social Function:  Social Functioning Social Maturity: Isolates Social Judgement: Normal  Stress:  Stress Stressors: Family conflict Coping Ability: Deficient supports Patient Takes Medications The Way The Doctor Instructed?: Yes Priority Risk: Low Acuity  Risk Assessment- Self-Harm Potential: Risk Assessment For Self-Harm Potential Thoughts of Self-Harm: No current thoughts Method: No plan Availability of Means: No access/NA  Risk Assessment -Dangerous to Others Potential: Risk Assessment For Dangerous to Others Potential Method: No Plan Availability of  Means: No access or NA Intent: Vague intent or NA Notification Required: No need or identified person  DSM5 Diagnoses: Patient Active Problem List   Diagnosis Date Noted  . HLD (hyperlipidemia) 11/09/2018  . Stress 11/09/2018  . History of alcohol abuse 09/06/2018  . Acute right ankle pain 09/06/2018  . Vitamin D deficiency 06/29/2018  . Prediabetes 06/28/2018  . Plantar fasciitis 08/29/2016  . GERD (gastroesophageal reflux disease) 12/18/2015  . Restless legs 03/19/2015  . Essential hypertension, benign 01/14/2014  . Obstructive sleep apnea 12/17/2013  . IBS (irritable bowel syndrome) 07/30/2013  . Insomnia 06/12/2013  . Cervical disc disorder with radiculopathy of cervical region 06/12/2013  . Morbid obesity with BMI of 45.0-49.9, adult ([place]) 06/12/2013  . Seasonal allergies 06/12/2013  . Anxiety and depression 06/12/2013    Patient Centered Plan: Patient is on the following Treatment Plan(s):  Anxiety  Recommendations for Services/Supports/Treatments: Recommendations for Services/Supports/Treatments Recommendations For Services/Supports/Treatments: Individual Therapy  Treatment Plan Summary:  Stabilize level of anxiety while increasing ability to function on a daily basis   Paul Cook

## 2019-04-04 ENCOUNTER — Encounter: Payer: Self-pay | Admitting: Gastroenterology

## 2019-04-08 ENCOUNTER — Ambulatory Visit: Payer: Self-pay

## 2019-04-08 ENCOUNTER — Ambulatory Visit: Payer: Self-pay | Admitting: Internal Medicine

## 2019-04-09 ENCOUNTER — Ambulatory Visit: Payer: Self-pay

## 2019-04-09 ENCOUNTER — Other Ambulatory Visit: Payer: Self-pay

## 2019-04-09 ENCOUNTER — Ambulatory Visit (INDEPENDENT_AMBULATORY_CARE_PROVIDER_SITE_OTHER): Payer: PPO | Admitting: Internal Medicine

## 2019-04-09 DIAGNOSIS — R319 Hematuria, unspecified: Secondary | ICD-10-CM

## 2019-04-09 DIAGNOSIS — I10 Essential (primary) hypertension: Secondary | ICD-10-CM

## 2019-04-09 DIAGNOSIS — Z1329 Encounter for screening for other suspected endocrine disorder: Secondary | ICD-10-CM | POA: Diagnosis not present

## 2019-04-09 DIAGNOSIS — Z125 Encounter for screening for malignant neoplasm of prostate: Secondary | ICD-10-CM | POA: Diagnosis not present

## 2019-04-09 DIAGNOSIS — G4700 Insomnia, unspecified: Secondary | ICD-10-CM | POA: Diagnosis not present

## 2019-04-09 DIAGNOSIS — R7303 Prediabetes: Secondary | ICD-10-CM | POA: Diagnosis not present

## 2019-04-09 DIAGNOSIS — G2581 Restless legs syndrome: Secondary | ICD-10-CM | POA: Diagnosis not present

## 2019-04-09 DIAGNOSIS — F419 Anxiety disorder, unspecified: Secondary | ICD-10-CM

## 2019-04-09 DIAGNOSIS — F329 Major depressive disorder, single episode, unspecified: Secondary | ICD-10-CM | POA: Diagnosis not present

## 2019-04-09 DIAGNOSIS — E559 Vitamin D deficiency, unspecified: Secondary | ICD-10-CM | POA: Diagnosis not present

## 2019-04-09 DIAGNOSIS — G4733 Obstructive sleep apnea (adult) (pediatric): Secondary | ICD-10-CM | POA: Diagnosis not present

## 2019-04-09 DIAGNOSIS — F32A Depression, unspecified: Secondary | ICD-10-CM

## 2019-04-09 MED ORDER — ROPINIROLE HCL 2 MG PO TABS: 6.0000 mg | ORAL_TABLET | Freq: Every day | ORAL | 3 refills | Status: DC

## 2019-04-09 NOTE — Progress Notes (Signed)
Telephone Note  I connected with Paul Cook   on 04/09/19 at 11:05 AM EDT by telephone and verified that I am speaking with the correct person using two identifiers.  Location patient: home Location provider:work  Persons participating in the virtual visit: patient, provider  I discussed the limitations of evaluation and management by telemedicine and the availability of in person appointments. The patient expressed understanding and agreed to proceed.   HPI: 1. HTN on zestoretic 20-12.5 2 pills qd and norvasc 5 mg qd BP 138/90 still  2. OSA on cpap wants to hold on retesting fo rnow 3. Anxiety/depression/insomnia sleep is better sleeping 5 hrs at a time then 3 hours so 8 hrs wife is at home due to [place]  19 which helps  He still has not had appt with psychiatry currently semi retired and now working. He is a little disappointed will not be able to see family in [place] 06/2019 due to [place] 19. He is doing Tibetan/Buddist meditation which helps. Mood is ok  4. RLS doing well on requip 6 mg qhs    ROS: See pertinent positives and negatives per HPI.  Past Medical History:  Diagnosis Date  . Adenomatous colon polyp    tubular  . Adenomatous rectal polyp    tubular  . Asthma   . Depression   . Diverticulosis   . GERD (gastroesophageal reflux disease)   . History of chicken pox   . Hyperlipidemia   . Hypertension   . Lung nodule    CT 10/2014  . OSA (obstructive sleep apnea)   . Sleep apnea    wears cpap  . Vitamin D deficiency     Past Surgical History:  Procedure Laterality Date  . COLONOSCOPY    . COLONOSCOPY W/ POLYPECTOMY  2015   [facility], benign  . HERNIA REPAIR     inguinal  . POLYPECTOMY    . TONSILLECTOMY     age 27     Family History  Problem Relation Age of Onset  . Depression Mother   . Hypertension Father   . Heart disease Father   . Stroke Father   . Cancer Maternal Grandmother        ovarian?  . Cancer Paternal Grandfather        prostate  .  Prostate cancer Paternal Grandfather   . Colon cancer Neg Hx   . Colon polyps Neg Hx     SOCIAL HX: married family lives in [place]    Current Outpatient Medications:  .  albuterol (PROVENTIL HFA;VENTOLIN HFA) 108 (90 Base) MCG/ACT inhaler, Inhale 1-2 puffs into the lungs every 6 (six) hours as needed for wheezing or shortness of breath., Disp: 1 Inhaler, Rfl: 11 .  amLODipine (NORVASC) 5 MG tablet, Take 1 tablet (5 mg total) by mouth daily., Disp: 90 tablet, Rfl: 3 .  Cholecalciferol 1.25 MG (50000 UT) capsule, Take 1 capsule (50,000 Units total) by mouth once a week., Disp: 13 capsule, Rfl: 1 .  dicyclomine (BENTYL) 10 MG capsule, Take 1 capsule (10 mg total) by mouth 2 (two) times daily., Disp: 180 capsule, Rfl: 3 .  ezetimibe (ZETIA) 10 MG tablet, Take 1 tablet (10 mg total) by mouth daily., Disp: 90 tablet, Rfl: 3 .  famotidine (PEPCID) 20 MG tablet, Take 1 tablet (20 mg total) by mouth daily., Disp: 90 tablet, Rfl: 3 .  gabapentin (NEURONTIN) 600 MG tablet, TAKE 1 TABLET(600 MG) BY MOUTH THREE TIMES DAILY, Disp: 270 tablet, Rfl: 2 .  lisinopril-hydrochlorothiazide (ZESTORETIC) 20-12.5 MG tablet, Take 2 tablets by mouth daily., Disp: 180 tablet, Rfl: 2 .  nystatin cream (MYCOSTATIN), Apply 1 application topically 2 (two) times daily., Disp: 90 g, Rfl: 4 .  rOPINIRole (REQUIP) 2 MG tablet, Take 3 tablets (6 mg total) by mouth at bedtime., Disp: 270 tablet, Rfl: 3 .  sertraline (ZOLOFT) 50 MG tablet, Take 1 tablet (50 mg total) by mouth daily., Disp: , Rfl:  .  sildenafil (REVATIO) 20 MG tablet, 1-5 tablets before intercourse 30 minutes to 4 hrs, Disp: 50 tablet, Rfl: 11 .  trazodone (DESYREL) 300 MG tablet, Take 1 tablet (300 mg total) by mouth at bedtime., Disp: 31 tablet, Rfl: 3  EXAM: before failed Doxy   VITALS per patient if applicable: wt per pt 177 lbs  GENERAL: alert, oriented, appears well and in no acute distress  HEENT: atraumatic, conjunttiva clear, no obvious  abnormalities on inspection of external nose and ears  NECK: normal movements of the head and neck  LUNGS: on inspection no signs of respiratory distress, breathing rate appears normal, no obvious gross SOB, gasping or wheezing  CV: no obvious cyanosis  MS: moves all visible extremities without noticeable abnormality  PSYCH/NEURO: pleasant and cooperative, no obvious depression or anxiety, speech and thought processing grossly intact  ASSESSMENT AND PLAN:  Discussed the following assessment and plan:  Insomnia, unspecified type - Plan: cont trazadone try to avoid meds I.e ambien due to h/o sleep walking/eating  RLS (restless legs syndrome) - Plan: rOPINIRole (REQUIP) 2 MG tablet taking 6 mg qhs   Essential hypertension, benign - Plan: Comprehensive metabolic panel, CBC with Differential/Platelet, Lipid panel Cont meds  rec check BP at home  Anxiety and depression - Plan: prev referral [facility] has not gone given # today   Obstructive sleep apnea - Plan: pt wants to hold on repeat cpap referral  Prediabetes - Plan: Hemoglobin A1c  Hematuria, unspecified type - Plan: Urinalysis, Routine w reflex microscopic, Urine Culture  Vitamin D deficiency - Plan: Vitamin D (25 hydroxy)  Prostate cancer screening - Plan: PSA, Medicare ( [facility] only), due 06/29/19   HM Fluutd tdap utd, prevnar, pna 23  Consider shingrix in future  Never smoker Nl PSAconsider DRE in future psa 0.74 06/28/18  Colonoscopy h/o multiple polyps due 04/12/2019 [facility]  -pt wants to hold  Consider derm in future notneeded now  MMR immune and hep C neg rec healthy diet and exercise    I discussed the assessment and treatment plan with the patient. The patient was provided an opportunity to ask questions and all were answered. The patient agreed with the plan and demonstrated an understanding of the instructions.   The patient was advised to call back or seek an in-person evaluation if the symptoms  worsen or if the condition fails to improve as anticipated.  Time spent 25 minutes  Delorise Jackson, MD

## 2019-04-09 NOTE — Progress Notes (Signed)
Pre visit review using our clinic review tool, if applicable. No additional management support is needed unless otherwise documented below in the visit note. 

## 2019-04-22 ENCOUNTER — Other Ambulatory Visit: Payer: Self-pay

## 2019-04-22 ENCOUNTER — Ambulatory Visit: Payer: PPO | Admitting: Licensed Clinical Social Worker

## 2019-04-23 ENCOUNTER — Ambulatory Visit (INDEPENDENT_AMBULATORY_CARE_PROVIDER_SITE_OTHER): Payer: PPO

## 2019-04-23 ENCOUNTER — Telehealth: Payer: Self-pay

## 2019-04-23 DIAGNOSIS — Z0000 Encounter for general adult medical examination without abnormal findings: Secondary | ICD-10-CM

## 2019-04-23 NOTE — Telephone Encounter (Signed)
Called pt at scheduled time to complete health maintenance and wellness. Please call me back at direct dial [phone].

## 2019-04-23 NOTE — Patient Instructions (Addendum)
  Paul Cook , Thank you for taking time to come for your Medicare Wellness Visit. I appreciate your ongoing commitment to your health goals. Please review the following plan we discussed and let me know if I can assist you in the future.   These are the goals we discussed: Goals      Patient Stated   . Blood Pressure < 140/90 (pt-stated)     Buy a new blood pressure machine with reading at 130/80       This is a list of the screening recommended for you and due dates:  Health Maintenance  Topic Date Due  . Colon Cancer Screening  04/12/2019  . Flu Shot  05/18/2019  . Tetanus Vaccine  04/17/2024  . Pneumonia vaccines  Completed

## 2019-04-23 NOTE — Progress Notes (Signed)
Subjective:   Paul Cook is a 68 y.o. male who presents for Medicare Annual/Subsequent preventive examination.  Review of Systems:  No ROS.  Medicare Wellness Virtual Visit.  Visual/audio telehealth visit, UTA vital signs.   See social history for additional risk factors.   Cardiac Risk Factors include: advanced age (>35men, >5 women);male gender;hypertension     Objective:    Vitals: There were no vitals taken for this visit.  There is no height or weight on file to calculate BMI.  Advanced Directives 04/23/2019 04/04/2018 03/16/2018 02/21/2018 03/30/2017 11/07/2016 02/29/2016  Does Patient Have a Medical Advance Directive? Yes No No No No No No  Type of Advance Directive Living will;Healthcare Power of Attorney - - - - - -  Does patient want to make changes to medical advance directive? No - Patient declined - - - - - -  Copy of [place] in Chart? No - copy requested - - - - - -  Would patient like information on creating a medical advance directive? - Yes (MAU/Ambulatory/Procedural Areas - Information given) No - Patient declined - No - Patient declined Yes (MAU/Ambulatory/Procedural Areas - Information given) -    Tobacco Social History   Tobacco Use  Smoking Status Never Smoker  Smokeless Tobacco Never Used     Counseling given: Not Answered   Clinical Intake:  Pre-visit preparation completed: Yes        Diabetes: No  How often do you need to have someone help you when you read instructions, pamphlets, or other written materials from your doctor or pharmacy?: 1 - Never  Interpreter Needed?: No     Past Medical History:  Diagnosis Date  . Adenomatous colon polyp    tubular  . Adenomatous rectal polyp    tubular  . Asthma   . Depression   . Diverticulosis   . GERD (gastroesophageal reflux disease)   . History of chicken pox   . Hyperlipidemia   . Hypertension   . Lung nodule    CT 10/2014  . OSA (obstructive sleep apnea)   . Sleep  apnea    wears cpap  . Vitamin D deficiency    Past Surgical History:  Procedure Laterality Date  . COLONOSCOPY    . COLONOSCOPY W/ POLYPECTOMY  2015   [facility], benign  . HERNIA REPAIR     inguinal  . POLYPECTOMY    . TONSILLECTOMY     age 26    Family History  Problem Relation Age of Onset  . Depression Mother   . Hypertension Father   . Heart disease Father   . Stroke Father   . Cancer Maternal Grandmother        ovarian?  . Cancer Paternal Grandfather        prostate  . Prostate cancer Paternal Grandfather   . Colon cancer Neg Hx   . Colon polyps Neg Hx    Social History   Socioeconomic History  . Marital status: Married    Spouse name: Not on file  . Number of children: Not on file  . Years of education: Not on file  . Highest education level: Not on file  Occupational History  . Not on file  Social Needs  . Financial resource strain: Not hard at all  . Food insecurity    Worry: Never true    Inability: Never true  . Transportation needs    Medical: No    Non-medical: No  Tobacco Use  .  Smoking status: Never Smoker  . Smokeless tobacco: Never Used  Substance and Sexual Activity  . Alcohol use: No    Alcohol/week: 0.0 standard drinks    Comment: stopped drinking 20 years ago  . Drug use: No  . Sexual activity: Never  Lifestyle  . Physical activity    Days per week: Not on file    Minutes per session: Not on file  . Stress: Not at all  Relationships  . Social Herbalist on phone: Not on file    Gets together: Not on file    Attends religious service: Not on file    Active member of club or organization: Not on file    Attends meetings of clubs or organizations: Not on file    Relationship status: Not on file  Other Topics Concern  . Not on file  Social History Narrative   Lives in [place] with wife, [name] 23 years as of 10/2018. 1 daughter in [place].      Work - Optician, dispensing, now Animal nutritionist      From [employer]           Outpatient Encounter Medications as of 04/23/2019  Medication Sig  . albuterol (PROVENTIL HFA;VENTOLIN HFA) 108 (90 Base) MCG/ACT inhaler Inhale 1-2 puffs into the lungs every 6 (six) hours as needed for wheezing or shortness of breath.  Marland Kitchen amLODipine (NORVASC) 5 MG tablet Take 1 tablet (5 mg total) by mouth daily.  . Cholecalciferol 1.25 MG (50000 UT) capsule Take 1 capsule (50,000 Units total) by mouth once a week.  . dicyclomine (BENTYL) 10 MG capsule Take 1 capsule (10 mg total) by mouth 2 (two) times daily.  Marland Kitchen ezetimibe (ZETIA) 10 MG tablet Take 1 tablet (10 mg total) by mouth daily.  . famotidine (PEPCID) 20 MG tablet Take 1 tablet (20 mg total) by mouth daily.  Marland Kitchen gabapentin (NEURONTIN) 600 MG tablet TAKE 1 TABLET(600 MG) BY MOUTH THREE TIMES DAILY  . lisinopril-hydrochlorothiazide (ZESTORETIC) 20-12.5 MG tablet Take 2 tablets by mouth daily.  Marland Kitchen nystatin cream (MYCOSTATIN) Apply 1 application topically 2 (two) times daily.  Marland Kitchen rOPINIRole (REQUIP) 2 MG tablet Take 3 tablets (6 mg total) by mouth at bedtime.  . sertraline (ZOLOFT) 50 MG tablet Take 1 tablet (50 mg total) by mouth daily.  . sildenafil (REVATIO) 20 MG tablet 1-5 tablets before intercourse 30 minutes to 4 hrs  . trazodone (DESYREL) 300 MG tablet Take 1 tablet (300 mg total) by mouth at bedtime.   No facility-administered encounter medications on file as of 04/23/2019.     Activities of Daily Living In your present state of health, do you have any difficulty performing the following activities: 04/23/2019  Hearing? N  Vision? N  Difficulty concentrating or making decisions? N  Walking or climbing stairs? N  Dressing or bathing? N  Doing errands, shopping? N  Preparing Food and eating ? N  Using the Toilet? N  In the past six months, have you accidently leaked urine? N  Do you have problems with loss of bowel control? N  Managing your Medications? N  Managing your Finances? N  Housekeeping or managing your Housekeeping? N   Some recent data might be hidden    Patient Care Team: McLean-Scocuzza, [name], MD as PCP - General (Internal Medicine)   Assessment:   This is a routine wellness examination for Damante.  I connected with patient 04/23/19 at 11:00 AM EDT by a video/audio enabled telemedicine application  and verified that I am speaking with the correct person using two identifiers. Patient stated full name and DOB. Patient gave permission to continue with virtual visit. Patient's location was at home and Nurse's location was at [place] office.   Health Screenings  Colonoscopy - 03/2016. Discussed and plans to schedule.  Glaucoma -none Hearing -demonstrates normal hearing during visit. Hemoglobin A1C - 06/2018 Cholesterol - 06/2018 Dental- visits every 6 months Vision- visits within the last 12 months.  Social  Alcohol intake - no         Smoking history- never    Smokers in home? none Illicit drug use? none Exercise - walking Diet - regular Sexually Active -not currently BMI- discussed the importance of a healthy diet, water intake and the benefits of aerobic exercise.  Educational material provided.   Safety  Patient feels safe at home- yes Patient does have smoke detectors at home- yes Patient does wear sunscreen or protective clothing when in direct sunlight -yes Patient does wear seat belt when in a moving vehicle -yes Patient drives- yes  [id number] precautions and sickness symptoms discussed.   Activities of Daily Living Patient denies needing assistance with: driving, household chores, feeding themselves, getting from bed to chair, getting to the toilet, bathing/showering, dressing, managing money, or preparing meals.  No new identified risk were noted.    Depression Screen Patient denies losing interest in daily life, feeling hopeless, or crying easily over simple problems.   Medication-taking as directed and without issues.   Fall Screen Patient denies being afraid of falling or  falling in the last year.   Memory Screen Patient is alert.  Patient denies difficulty focusing, concentrating or misplacing items. Correctly identified the president of the [place], season and recall.  Immunizations The following Immunizations were discussed: Influenza, shingles, pneumonia, and tetanus.   Other Providers Patient Care Team: McLean-Scocuzza, [name], MD as PCP - General (Internal Medicine)  Exercise Activities and Dietary recommendations Current Exercise Habits: Home exercise routine, Type of exercise: walking, Time (Minutes): 20, Intensity: Mild  Goals      Patient Stated   . Blood Pressure < 140/90 (pt-stated)     Buy a new blood pressure machine with reading at 130/80       Fall Risk Fall Risk  04/09/2019 04/04/2018 03/29/2017 03/02/2017 01/30/2017  Falls in the past year? 1 No No No No  Number falls in past yr: 1 - - - -   Depression Screen PHQ 2/9 Scores 04/23/2019 04/09/2019 04/04/2018 03/29/2017  PHQ - 2 Score 0 0 0 0    Cognitive Function MMSE - Mini Mental State Exam 04/04/2018  Orientation to time 5  Orientation to Place 5  Registration 3  Attention/ Calculation 5  Recall 3  Language- name 2 objects 2  Language- repeat 1  Language- follow 3 step command 3  Language- read & follow direction 1  Write a sentence 1  Copy design 1  Total score 30     6CIT Screen 04/23/2019  What Year? 0 points  What month? 0 points  What time? 0 points  Count back from 20 0 points  Months in reverse 0 points  Repeat phrase 0 points  Total Score 0    Immunization History  Administered Date(s) Administered  . Influenza Whole 06/19/2013  . Influenza, High Dose Seasonal PF 06/28/2018  . Influenza,inj,Quad PF,6+ Mos 07/15/2014, 07/18/2016  . Influenza-Unspecified 06/18/2015  . Pneumococcal Conjugate-13 03/30/2017  . Pneumococcal Polysaccharide-23 04/04/2018  . Tdap 04/17/2014  .  Zoster 12/18/2013   Screening Tests Health Maintenance  Topic Date Due  .  COLONOSCOPY  04/12/2019  . INFLUENZA VACCINE  05/18/2019  . TETANUS/TDAP  04/17/2024  . PNA vac Low Risk Adult  Completed       Plan:    End of life planning; Advance aging; Advanced directives discussed.  Copy of current HCPOA/Living Will requested upon completion.    I have personally reviewed and noted the following in the patient's chart:   . Medical and social history . Use of alcohol, tobacco or illicit drugs  . Current medications and supplements . Functional ability and status . Nutritional status . Physical activity . Advanced directives . List of other physicians . Hospitalizations, surgeries, and ER visits in previous 12 months . Vitals . Screenings to include cognitive, depression, and falls . Referrals and appointments  In addition, I have reviewed and discussed with patient certain preventive protocols, quality metrics, and best practice recommendations. A written personalized care plan for preventive services as well as general preventive health recommendations were provided to patient.     Varney Biles, LPN  02/17/6269

## 2019-04-24 ENCOUNTER — Other Ambulatory Visit: Payer: Self-pay | Admitting: Internal Medicine

## 2019-04-24 DIAGNOSIS — F419 Anxiety disorder, unspecified: Secondary | ICD-10-CM

## 2019-04-24 DIAGNOSIS — G4700 Insomnia, unspecified: Secondary | ICD-10-CM

## 2019-04-24 MED ORDER — LORAZEPAM 0.5 MG PO TABS: 0.5000 mg | ORAL_TABLET | Freq: Every evening | ORAL | 2 refills | Status: DC | PRN

## 2019-05-17 ENCOUNTER — Ambulatory Visit: Payer: PPO | Admitting: Licensed Clinical Social Worker

## 2019-05-21 ENCOUNTER — Ambulatory Visit: Payer: PPO | Admitting: Licensed Clinical Social Worker

## 2019-05-29 ENCOUNTER — Other Ambulatory Visit: Payer: Self-pay | Admitting: Internal Medicine

## 2019-05-29 DIAGNOSIS — E785 Hyperlipidemia, unspecified: Secondary | ICD-10-CM

## 2019-05-29 MED ORDER — EZETIMIBE 10 MG PO TABS: 10.0000 mg | ORAL_TABLET | Freq: Every day | ORAL | 3 refills | Status: DC

## 2019-06-20 DIAGNOSIS — M5136 Other intervertebral disc degeneration, lumbar region: Secondary | ICD-10-CM | POA: Diagnosis not present

## 2019-06-20 DIAGNOSIS — M9903 Segmental and somatic dysfunction of lumbar region: Secondary | ICD-10-CM | POA: Diagnosis not present

## 2019-06-20 DIAGNOSIS — M546 Pain in thoracic spine: Secondary | ICD-10-CM | POA: Diagnosis not present

## 2019-06-20 DIAGNOSIS — M9902 Segmental and somatic dysfunction of thoracic region: Secondary | ICD-10-CM | POA: Diagnosis not present

## 2019-06-26 ENCOUNTER — Ambulatory Visit (INDEPENDENT_AMBULATORY_CARE_PROVIDER_SITE_OTHER): Payer: PPO | Admitting: Family Medicine

## 2019-06-26 ENCOUNTER — Other Ambulatory Visit: Payer: Self-pay

## 2019-06-26 DIAGNOSIS — F1011 Alcohol abuse, in remission: Secondary | ICD-10-CM

## 2019-06-26 DIAGNOSIS — R6889 Other general symptoms and signs: Secondary | ICD-10-CM

## 2019-06-26 DIAGNOSIS — R1010 Upper abdominal pain, unspecified: Secondary | ICD-10-CM | POA: Diagnosis not present

## 2019-06-26 NOTE — Progress Notes (Signed)
Patient ID: Paul Cook, male   DOB: 06/18/1951, 68 y.o.   MRN: [id number]    Virtual Visit via video Note  This visit type was conducted due to national recommendations for restrictions regarding the COVID-19 pandemic (e.g. social distancing).  This format is felt to be most appropriate for this patient at this time.  All issues noted in this document were discussed and addressed.  No physical exam was performed (except for noted visual exam findings with Video Visits).   I connected with Paul Cook today at 10:40 AM EDT by a video enabled telemedicine application or telephone and verified that I am speaking with the correct person using two identifiers. Location patient: home Location provider: work or home office Persons participating in the virtual visit: patient, provider  I discussed the limitations, risks, security and privacy concerns of performing an evaluation and management service by video and the availability of in person appointments. I also discussed with the patient that there may be a patient responsible charge related to this service. The patient expressed understanding and agreed to proceed.   HPI:  Patient and I connected via video due to complaints of some upper abdominal pain and cramping off and on for the past couple of days.  Patient states he believes him drinking beer is causing this.  States for the past 2 or 3 weeks he has been drinking 4-5 beers per day.  Wife has been encouraging him to cut back on the beer drinking.  Denies any vomiting or diarrhea.  Appetite has been normal.  Denies any urinary problems.  Denies heartburn.  Takes Pepcid & bentyl regularly. No cough, CP, SOB or wheezes.  Also states his wife has mentioned that his memory seems to be getting worse.  He wonders if this could be related to the medicines he takes or if something is abnormal on his blood work.     ROS: See pertinent positives and negatives per HPI.  Past Medical History:   Diagnosis Date  . Adenomatous colon polyp    tubular  . Adenomatous rectal polyp    tubular  . Asthma   . Depression   . Diverticulosis   . GERD (gastroesophageal reflux disease)   . History of chicken pox   . Hyperlipidemia   . Hypertension   . Lung nodule    CT 10/2014  . OSA (obstructive sleep apnea)   . Sleep apnea    wears cpap  . Vitamin D deficiency     Past Surgical History:  Procedure Laterality Date  . COLONOSCOPY    . COLONOSCOPY W/ POLYPECTOMY  2015   [facility], benign  . HERNIA REPAIR     inguinal  . POLYPECTOMY    . TONSILLECTOMY     age 39     Family History  Problem Relation Age of Onset  . Depression Mother   . Hypertension Father   . Heart disease Father   . Stroke Father   . Cancer Maternal Grandmother        ovarian?  . Cancer Paternal Grandfather        prostate  . Prostate cancer Paternal Grandfather   . Colon cancer Neg Hx   . Colon polyps Neg Hx    Social History   Tobacco Use  . Smoking status: Never Smoker  . Smokeless tobacco: Never Used  Substance Use Topics  . Alcohol use: No    Alcohol/week: 0.0 standard drinks    Comment: stopped drinking 20 years ago  Current Outpatient Medications:  .  albuterol (PROVENTIL HFA;VENTOLIN HFA) 108 (90 Base) MCG/ACT inhaler, Inhale 1-2 puffs into the lungs every 6 (six) hours as needed for wheezing or shortness of breath., Disp: 1 Inhaler, Rfl: 11 .  amLODipine (NORVASC) 5 MG tablet, Take 1 tablet (5 mg total) by mouth daily., Disp: 90 tablet, Rfl: 3 .  Cholecalciferol 1.25 MG (50000 UT) capsule, Take 1 capsule (50,000 Units total) by mouth once a week., Disp: 13 capsule, Rfl: 1 .  dicyclomine (BENTYL) 10 MG capsule, Take 1 capsule (10 mg total) by mouth 2 (two) times daily., Disp: 180 capsule, Rfl: 3 .  ezetimibe (ZETIA) 10 MG tablet, Take 1 tablet (10 mg total) by mouth daily., Disp: 90 tablet, Rfl: 3 .  famotidine (PEPCID) 20 MG tablet, Take 1 tablet (20 mg total) by mouth daily., Disp: 90  tablet, Rfl: 3 .  gabapentin (NEURONTIN) 600 MG tablet, TAKE 1 TABLET(600 MG) BY MOUTH THREE TIMES DAILY, Disp: 270 tablet, Rfl: 2 .  lisinopril-hydrochlorothiazide (ZESTORETIC) 20-12.5 MG tablet, Take 2 tablets by mouth daily., Disp: 180 tablet, Rfl: 2 .  LORazepam (ATIVAN) 0.5 MG tablet, Take 1-2 tablets (0.5-1 mg total) by mouth at bedtime as needed for anxiety., Disp: 60 tablet, Rfl: 2 .  nystatin cream (MYCOSTATIN), Apply 1 application topically 2 (two) times daily., Disp: 90 g, Rfl: 4 .  rOPINIRole (REQUIP) 2 MG tablet, Take 3 tablets (6 mg total) by mouth at bedtime., Disp: 270 tablet, Rfl: 3 .  sertraline (ZOLOFT) 50 MG tablet, Take 1 tablet (50 mg total) by mouth daily., Disp: , Rfl:  .  sildenafil (REVATIO) 20 MG tablet, 1-5 tablets before intercourse 30 minutes to 4 hrs, Disp: 50 tablet, Rfl: 11 .  trazodone (DESYREL) 300 MG tablet, Take 1 tablet (300 mg total) by mouth at bedtime., Disp: 31 tablet, Rfl: 3  EXAM:  GENERAL: alert, oriented, appears well and in no acute distress  HEENT: atraumatic, conjunttiva clear, no obvious abnormalities on inspection of external nose and ears  NECK: normal movements of the head and neck  LUNGS: on inspection no signs of respiratory distress, breathing rate appears normal, no obvious gross SOB, gasping or wheezing  CV: no obvious cyanosis  MS: moves all visible extremities without noticeable abnormality  PSYCH/NEURO: pleasant and cooperative, no obvious depression or anxiety, speech and thought processing grossly intact  ASSESSMENT AND PLAN:  Discussed the following assessment and plan:   Upper abdominal pain/history of alcohol abuse/current alcohol abuse- it is possible patient's beer drinking could contribute to upper abdominal pain and also could be flaring up his acid encouraged to continue to take Pepcid & Bentyl daily.  Also advised to slowly cut back on beer intake go from 4 to 5 cans/day to 3 cans/day for a period of time, then 2  cans per day, then 1 and then stopping.  Advised abruptly stopping alcohol could cause him to go into withdrawal and could be very dangerous.  Patient verbalized understanding of the dangers of abruptly stopping alcohol intake.  Patient does not want to do any sort of imaging at this time however his pain progresses and let us know if it is not getting better.  Advised if his pain becomes severe, he develops any other symptoms like fever chills, vomiting, diarrhea to call office right away and let us know.  Forgetfulness-advised patient that his forgetfulness could potentially be related to medications, alcohol intake. Advised he does have upcoming lab work appointment on 16th September and follow-up  appointment PCP on 30th September.  Advised patient that I am hopeful that the blood work will help her navigate causes of forgetfulness also follow-up with his PCP would be a good time to look at his medicine list and to see if anything needs to be altered.  Discussed some strategies to help improve memory like goals, reading, making lists, setting reminders    I discussed the assessment and treatment plan with the patient. The patient was provided an opportunity to ask questions and all were answered. The patient agreed with the plan and demonstrated an understanding of the instructions.   The patient was advised to call back or seek an in-person evaluation if the symptoms worsen or if the condition fails to improve as anticipated.  I provided 15 minutes of video-face-to-face time during this encounter.   Jodelle Green, FNP

## 2019-07-01 ENCOUNTER — Other Ambulatory Visit: Payer: PPO

## 2019-07-02 DIAGNOSIS — M9902 Segmental and somatic dysfunction of thoracic region: Secondary | ICD-10-CM | POA: Diagnosis not present

## 2019-07-02 DIAGNOSIS — M5136 Other intervertebral disc degeneration, lumbar region: Secondary | ICD-10-CM | POA: Diagnosis not present

## 2019-07-02 DIAGNOSIS — M546 Pain in thoracic spine: Secondary | ICD-10-CM | POA: Diagnosis not present

## 2019-07-02 DIAGNOSIS — M9903 Segmental and somatic dysfunction of lumbar region: Secondary | ICD-10-CM | POA: Diagnosis not present

## 2019-07-03 ENCOUNTER — Other Ambulatory Visit: Payer: PPO

## 2019-07-05 DIAGNOSIS — M5136 Other intervertebral disc degeneration, lumbar region: Secondary | ICD-10-CM | POA: Diagnosis not present

## 2019-07-05 DIAGNOSIS — M9902 Segmental and somatic dysfunction of thoracic region: Secondary | ICD-10-CM | POA: Diagnosis not present

## 2019-07-05 DIAGNOSIS — M9903 Segmental and somatic dysfunction of lumbar region: Secondary | ICD-10-CM | POA: Diagnosis not present

## 2019-07-05 DIAGNOSIS — M546 Pain in thoracic spine: Secondary | ICD-10-CM | POA: Diagnosis not present

## 2019-07-08 DIAGNOSIS — M9902 Segmental and somatic dysfunction of thoracic region: Secondary | ICD-10-CM | POA: Diagnosis not present

## 2019-07-08 DIAGNOSIS — M9903 Segmental and somatic dysfunction of lumbar region: Secondary | ICD-10-CM | POA: Diagnosis not present

## 2019-07-08 DIAGNOSIS — M5136 Other intervertebral disc degeneration, lumbar region: Secondary | ICD-10-CM | POA: Diagnosis not present

## 2019-07-08 DIAGNOSIS — M546 Pain in thoracic spine: Secondary | ICD-10-CM | POA: Diagnosis not present

## 2019-07-09 ENCOUNTER — Other Ambulatory Visit (INDEPENDENT_AMBULATORY_CARE_PROVIDER_SITE_OTHER): Payer: PPO

## 2019-07-09 ENCOUNTER — Other Ambulatory Visit: Payer: Self-pay

## 2019-07-09 DIAGNOSIS — I10 Essential (primary) hypertension: Secondary | ICD-10-CM | POA: Diagnosis not present

## 2019-07-09 DIAGNOSIS — E559 Vitamin D deficiency, unspecified: Secondary | ICD-10-CM | POA: Diagnosis not present

## 2019-07-09 DIAGNOSIS — R7303 Prediabetes: Secondary | ICD-10-CM | POA: Diagnosis not present

## 2019-07-09 DIAGNOSIS — R319 Hematuria, unspecified: Secondary | ICD-10-CM

## 2019-07-09 DIAGNOSIS — Z125 Encounter for screening for malignant neoplasm of prostate: Secondary | ICD-10-CM | POA: Diagnosis not present

## 2019-07-09 DIAGNOSIS — Z1329 Encounter for screening for other suspected endocrine disorder: Secondary | ICD-10-CM

## 2019-07-09 LAB — CBC WITH DIFFERENTIAL/PLATELET
Basophils Absolute: 0 10*3/uL (ref 0.0–0.1)
Basophils Relative: 0.5 % (ref 0.0–3.0)
Eosinophils Absolute: 0.1 10*3/uL (ref 0.0–0.7)
Eosinophils Relative: 1.2 % (ref 0.0–5.0)
HCT: 44 % (ref 39.0–52.0)
Hemoglobin: 14.7 g/dL (ref 13.0–17.0)
Lymphocytes Relative: 21.6 % (ref 12.0–46.0)
Lymphs Abs: 2 10*3/uL (ref 0.7–4.0)
MCHC: 33.5 g/dL (ref 30.0–36.0)
MCV: 91.9 fl (ref 78.0–100.0)
Monocytes Absolute: 0.6 10*3/uL (ref 0.1–1.0)
Monocytes Relative: 6 % (ref 3.0–12.0)
Neutro Abs: 6.6 10*3/uL (ref 1.4–7.7)
Neutrophils Relative %: 70.7 % (ref 43.0–77.0)
Platelets: 176 10*3/uL (ref 150.0–400.0)
RBC: 4.79 Mil/uL (ref 4.22–5.81)
RDW: 13.9 % (ref 11.5–15.5)
WBC: 9.3 10*3/uL (ref 4.0–10.5)

## 2019-07-09 LAB — LIPID PANEL
Cholesterol: 123 mg/dL (ref 0–200)
HDL: 52.2 mg/dL (ref >39.00)
LDL Cholesterol: 53 mg/dL (ref 0–99)
NonHDL: 71.02
Total CHOL/HDL Ratio: 2
Triglycerides: 89 mg/dL (ref 0.0–149.0)
VLDL: 17.8 mg/dL (ref 0.0–40.0)

## 2019-07-09 LAB — VITAMIN D 25 HYDROXY (VIT D DEFICIENCY, FRACTURES): VITD: 49.99 ng/mL (ref 30.00–100.00)

## 2019-07-09 LAB — COMPREHENSIVE METABOLIC PANEL
ALT: 44 U/L (ref 0–53)
AST: 32 U/L (ref 0–37)
Albumin: 4.4 g/dL (ref 3.5–5.2)
Alkaline Phosphatase: 83 U/L (ref 39–117)
BUN: 14 mg/dL (ref 6–23)
CO2: 33 mEq/L — ABNORMAL HIGH (ref 19–32)
Calcium: 9.2 mg/dL (ref 8.4–10.5)
Chloride: 100 mEq/L (ref 96–112)
Creatinine, Ser: 0.93 mg/dL (ref 0.40–1.50)
GFR: 80.84 mL/min (ref >60.00)
Glucose, Bld: 86 mg/dL (ref 70–99)
Potassium: 3.9 mEq/L (ref 3.5–5.1)
Sodium: 138 mEq/L (ref 135–145)
Total Bilirubin: 0.6 mg/dL (ref 0.2–1.2)
Total Protein: 6.7 g/dL (ref 6.0–8.3)

## 2019-07-09 LAB — TSH: TSH: 2.21 u[IU]/mL (ref 0.35–4.50)

## 2019-07-09 LAB — PSA, MEDICARE: PSA: 1.04 ng/ml (ref 0.10–4.00)

## 2019-07-09 LAB — HEMOGLOBIN A1C: Hgb A1c MFr Bld: 5.8 % (ref 4.6–6.5)

## 2019-07-10 DIAGNOSIS — M9903 Segmental and somatic dysfunction of lumbar region: Secondary | ICD-10-CM | POA: Diagnosis not present

## 2019-07-10 DIAGNOSIS — M9902 Segmental and somatic dysfunction of thoracic region: Secondary | ICD-10-CM | POA: Diagnosis not present

## 2019-07-10 DIAGNOSIS — M546 Pain in thoracic spine: Secondary | ICD-10-CM | POA: Diagnosis not present

## 2019-07-10 DIAGNOSIS — M5136 Other intervertebral disc degeneration, lumbar region: Secondary | ICD-10-CM | POA: Diagnosis not present

## 2019-07-11 LAB — URINALYSIS, ROUTINE W REFLEX MICROSCOPIC
Bilirubin Urine: NEGATIVE
Glucose, UA: NEGATIVE
Hgb urine dipstick: NEGATIVE
Ketones, ur: NEGATIVE
Leukocytes,Ua: NEGATIVE
Nitrite: NEGATIVE
Protein, ur: NEGATIVE
Specific Gravity, Urine: 1.017 (ref 1.001–1.03)
pH: <=5 (ref 5.0–8.0)

## 2019-07-11 LAB — URINE CULTURE
MICRO NUMBER:: 908457
SPECIMEN QUALITY:: ADEQUATE

## 2019-07-12 ENCOUNTER — Other Ambulatory Visit: Payer: Self-pay | Admitting: Internal Medicine

## 2019-07-12 DIAGNOSIS — F329 Major depressive disorder, single episode, unspecified: Secondary | ICD-10-CM

## 2019-07-12 DIAGNOSIS — F32A Depression, unspecified: Secondary | ICD-10-CM

## 2019-07-12 DIAGNOSIS — G4700 Insomnia, unspecified: Secondary | ICD-10-CM

## 2019-07-12 MED ORDER — TRAZODONE HCL 300 MG PO TABS: 300.0000 mg | ORAL_TABLET | Freq: Every day | ORAL | 5 refills | Status: DC

## 2019-07-16 DIAGNOSIS — M546 Pain in thoracic spine: Secondary | ICD-10-CM | POA: Diagnosis not present

## 2019-07-16 DIAGNOSIS — M5136 Other intervertebral disc degeneration, lumbar region: Secondary | ICD-10-CM | POA: Diagnosis not present

## 2019-07-16 DIAGNOSIS — M9902 Segmental and somatic dysfunction of thoracic region: Secondary | ICD-10-CM | POA: Diagnosis not present

## 2019-07-16 DIAGNOSIS — M9903 Segmental and somatic dysfunction of lumbar region: Secondary | ICD-10-CM | POA: Diagnosis not present

## 2019-07-17 ENCOUNTER — Telehealth: Payer: Self-pay

## 2019-07-17 ENCOUNTER — Other Ambulatory Visit: Payer: Self-pay

## 2019-07-17 ENCOUNTER — Ambulatory Visit (INDEPENDENT_AMBULATORY_CARE_PROVIDER_SITE_OTHER): Payer: PPO | Admitting: Internal Medicine

## 2019-07-17 DIAGNOSIS — F1011 Alcohol abuse, in remission: Secondary | ICD-10-CM

## 2019-07-17 DIAGNOSIS — G4700 Insomnia, unspecified: Secondary | ICD-10-CM | POA: Diagnosis not present

## 2019-07-17 DIAGNOSIS — G2581 Restless legs syndrome: Secondary | ICD-10-CM | POA: Diagnosis not present

## 2019-07-17 DIAGNOSIS — R413 Other amnesia: Secondary | ICD-10-CM | POA: Diagnosis not present

## 2019-07-17 DIAGNOSIS — E785 Hyperlipidemia, unspecified: Secondary | ICD-10-CM | POA: Diagnosis not present

## 2019-07-17 DIAGNOSIS — F419 Anxiety disorder, unspecified: Secondary | ICD-10-CM | POA: Diagnosis not present

## 2019-07-17 DIAGNOSIS — R7303 Prediabetes: Secondary | ICD-10-CM

## 2019-07-17 DIAGNOSIS — I10 Essential (primary) hypertension: Secondary | ICD-10-CM

## 2019-07-17 DIAGNOSIS — F329 Major depressive disorder, single episode, unspecified: Secondary | ICD-10-CM | POA: Diagnosis not present

## 2019-07-17 DIAGNOSIS — F32A Depression, unspecified: Secondary | ICD-10-CM

## 2019-07-17 NOTE — Telephone Encounter (Signed)
Copied from [place] [id number]. Topic: Quick Communication - See Telephone Encounter >> Jul 17, 2019  1:22 PM Loma Boston wrote: CRM for notification. See Telephone encounter for: 07/17/19. Pt sent an updated phone no for Appt with [facility] at 1:30. Update on appt notes

## 2019-07-17 NOTE — Telephone Encounter (Signed)
Patient had appt at 1:30 pm.

## 2019-07-17 NOTE — Progress Notes (Signed)
Telephone Note  I connected with Paul Cook   on 07/17/19 at  1:30 PM EDT telephone and verified that I am speaking with the correct person using two identifiers.  Location patient: home Location provider:work or home office Persons participating in the virtual visit: patient, provider, pts wife   I discussed the limitations of evaluation and management by telemedicine and the availability of in person appointments. The patient expressed understanding and agreed to proceed.   HPI: 1. Left buttock discomfort intermittent sees chiropractor and helps no pain today CT scan with deg facet disease years ago in 2016/17  2. HTN rec get BP cuff on norvasc 5 mg qd lis hct 2 tablets qd  3. Anxiety/depression/insomnia/memory loss/RLS/etoh abuse drinking 4-12 oz beers daily with h/o alcohol abuse -will refer to psych  -does not wish to see Paul Cook   ROS: See pertinent positives and negatives per HPI.  Past Medical History:  Diagnosis Date  . Adenomatous colon polyp    tubular  . Adenomatous rectal polyp    tubular  . Asthma   . Depression   . Diverticulosis   . GERD (gastroesophageal reflux disease)   . History of chicken pox   . Hyperlipidemia   . Hypertension   . Lung nodule    CT 10/2014  . OSA (obstructive sleep apnea)   . Sleep apnea    wears cpap  . Vitamin D deficiency     Past Surgical History:  Procedure Laterality Date  . COLONOSCOPY    . COLONOSCOPY W/ POLYPECTOMY  2015   [facility], benign  . HERNIA REPAIR     inguinal  . POLYPECTOMY    . TONSILLECTOMY     age 58     Family History  Problem Relation Age of Onset  . Depression Mother   . Hypertension Father   . Heart disease Father   . Stroke Father   . Cancer Maternal Grandmother        ovarian?  . Cancer Paternal Grandfather        prostate  . Prostate cancer Paternal Grandfather   . Colon cancer Neg Hx   . Colon polyps Neg Hx     SOCIAL HX:  Lives in [place] with wife, Paul Cook 23 years as of  10/2018. 1 daughter in [place].  Work - Optician, dispensing, now Animal nutritionist  From [employer]    Current Outpatient Medications:  .  albuterol (PROVENTIL HFA;VENTOLIN HFA) 108 (90 Base) MCG/ACT inhaler, Inhale 1-2 puffs into the lungs every 6 (six) hours as needed for wheezing or shortness of breath., Disp: 1 Inhaler, Rfl: 11 .  amLODipine (NORVASC) 5 MG tablet, Take 1 tablet (5 mg total) by mouth daily., Disp: 90 tablet, Rfl: 3 .  Cholecalciferol 50 MCG (2000 UT) CAPS, Take 2,000 Units by mouth., Disp: , Rfl:  .  dicyclomine (BENTYL) 10 MG capsule, Take 1 capsule (10 mg total) by mouth 2 (two) times daily., Disp: 180 capsule, Rfl: 3 .  ezetimibe (ZETIA) 10 MG tablet, Take 1 tablet (10 mg total) by mouth daily., Disp: 90 tablet, Rfl: 3 .  famotidine (PEPCID) 20 MG tablet, Take 1 tablet (20 mg total) by mouth daily., Disp: 90 tablet, Rfl: 3 .  gabapentin (NEURONTIN) 600 MG tablet, TAKE 1 TABLET(600 MG) BY MOUTH THREE TIMES DAILY, Disp: 270 tablet, Rfl: 2 .  lisinopril-hydrochlorothiazide (ZESTORETIC) 20-12.5 MG tablet, Take 2 tablets by mouth daily., Disp: 180 tablet, Rfl: 2 .  LORazepam (ATIVAN) 0.5 MG tablet, Take 1-2  tablets (0.5-1 mg total) by mouth at bedtime as needed for anxiety., Disp: 60 tablet, Rfl: 2 .  nystatin cream (MYCOSTATIN), Apply 1 application topically 2 (two) times daily., Disp: 90 g, Rfl: 4 .  rOPINIRole (REQUIP) 2 MG tablet, Take 3 tablets (6 mg total) by mouth at bedtime., Disp: 270 tablet, Rfl: 3 .  sertraline (ZOLOFT) 50 MG tablet, Take 1 tablet (50 mg total) by mouth daily., Disp: , Rfl:  .  sildenafil (REVATIO) 20 MG tablet, 1-5 tablets before intercourse 30 minutes to 4 hrs, Disp: 50 tablet, Rfl: 11 .  trazodone (DESYREL) 300 MG tablet, Take 1 tablet (300 mg total) by mouth at bedtime., Disp: 31 tablet, Rfl: 5  EXAM:  VITALS per patient if applicable:  GENERAL: alert, oriented, appears well and in no acute distress  HEENT: atraumatic, conjunttiva clear, no  obvious abnormalities on inspection of external nose and ears  NECK: normal movements of the head and neck  LUNGS: on inspection no signs of respiratory distress, breathing rate appears normal, no obvious gross SOB, gasping or wheezing  CV: no obvious cyanosis  MS: moves all visible extremities without noticeable abnormality  PSYCH/NEURO: pleasant and cooperative, no obvious depression or anxiety, speech and thought processing grossly intact  ASSESSMENT AND PLAN:  Discussed the following assessment and plan:  Memory loss - Plan: Ambulatory referral to Psychiatry needs help below and etoh use h/o abuse   Insomnia, unspecified type - Plan: Ambulatory referral to Psychiatry  Anxiety and depression - Plan: Ambulatory referral to Psychiatry  History of alcohol abuse - Plan: Ambulatory referral to Psychiatry  Restless legs - Plan: Ambulatory referral to Psychiatry  Essential hypertension, benign -cont meds   Prediabetes rec healthy diet and exercise   Hyperlipidemia, unspecified hyperlipidemia type Improved on zetia   HM Flucall to sch appt or pharm 2020  tdap utd, prevnar, pna 23  Consider shingrix in future  Never smoker Nl PSA1.04 07/09/19  Colonoscopy h/o multiple polyps due 04/12/2019 [facility]  -pt wants to hold  Consider derm in future notneeded now  MMR immune and hep C neg rec healthy diet and exercise   -we discussed possible serious and likely etiologies, options for evaluation and workup, limitations of telemedicine visit vs in person visit, treatment, treatment risks and precautions. Pt prefers to treat via telemedicine empirically rather then risking or undertaking an in person visit at this moment. Patient agrees to seek prompt in person care if worsening, new symptoms arise, or if is not improving with treatment.   I discussed the assessment and treatment plan with the patient. The patient was provided an opportunity to ask questions and all were answered.  The patient agreed with the plan and demonstrated an understanding of the instructions.   The patient was advised to call back or seek an in-person evaluation if the symptoms worsen or if the condition fails to improve as anticipated.  Time spent 20 minutes   Delorise Jackson, MD

## 2019-07-18 DIAGNOSIS — M546 Pain in thoracic spine: Secondary | ICD-10-CM | POA: Diagnosis not present

## 2019-07-18 DIAGNOSIS — M9902 Segmental and somatic dysfunction of thoracic region: Secondary | ICD-10-CM | POA: Diagnosis not present

## 2019-07-18 DIAGNOSIS — M9903 Segmental and somatic dysfunction of lumbar region: Secondary | ICD-10-CM | POA: Diagnosis not present

## 2019-07-18 DIAGNOSIS — M5136 Other intervertebral disc degeneration, lumbar region: Secondary | ICD-10-CM | POA: Diagnosis not present

## 2019-07-22 ENCOUNTER — Other Ambulatory Visit: Payer: Self-pay

## 2019-07-22 ENCOUNTER — Ambulatory Visit (INDEPENDENT_AMBULATORY_CARE_PROVIDER_SITE_OTHER): Payer: PPO

## 2019-07-22 DIAGNOSIS — M9902 Segmental and somatic dysfunction of thoracic region: Secondary | ICD-10-CM | POA: Diagnosis not present

## 2019-07-22 DIAGNOSIS — M9903 Segmental and somatic dysfunction of lumbar region: Secondary | ICD-10-CM | POA: Diagnosis not present

## 2019-07-22 DIAGNOSIS — Z23 Encounter for immunization: Secondary | ICD-10-CM

## 2019-07-22 DIAGNOSIS — M5136 Other intervertebral disc degeneration, lumbar region: Secondary | ICD-10-CM | POA: Diagnosis not present

## 2019-07-22 DIAGNOSIS — M546 Pain in thoracic spine: Secondary | ICD-10-CM | POA: Diagnosis not present

## 2019-07-24 DIAGNOSIS — M9902 Segmental and somatic dysfunction of thoracic region: Secondary | ICD-10-CM | POA: Diagnosis not present

## 2019-07-24 DIAGNOSIS — M5136 Other intervertebral disc degeneration, lumbar region: Secondary | ICD-10-CM | POA: Diagnosis not present

## 2019-07-24 DIAGNOSIS — M546 Pain in thoracic spine: Secondary | ICD-10-CM | POA: Diagnosis not present

## 2019-07-24 DIAGNOSIS — M9903 Segmental and somatic dysfunction of lumbar region: Secondary | ICD-10-CM | POA: Diagnosis not present

## 2019-07-26 DIAGNOSIS — M9902 Segmental and somatic dysfunction of thoracic region: Secondary | ICD-10-CM | POA: Diagnosis not present

## 2019-07-26 DIAGNOSIS — M9903 Segmental and somatic dysfunction of lumbar region: Secondary | ICD-10-CM | POA: Diagnosis not present

## 2019-07-26 DIAGNOSIS — M5136 Other intervertebral disc degeneration, lumbar region: Secondary | ICD-10-CM | POA: Diagnosis not present

## 2019-07-26 DIAGNOSIS — M546 Pain in thoracic spine: Secondary | ICD-10-CM | POA: Diagnosis not present

## 2019-08-05 DIAGNOSIS — M9902 Segmental and somatic dysfunction of thoracic region: Secondary | ICD-10-CM | POA: Diagnosis not present

## 2019-08-05 DIAGNOSIS — M5136 Other intervertebral disc degeneration, lumbar region: Secondary | ICD-10-CM | POA: Diagnosis not present

## 2019-08-05 DIAGNOSIS — M9903 Segmental and somatic dysfunction of lumbar region: Secondary | ICD-10-CM | POA: Diagnosis not present

## 2019-08-05 DIAGNOSIS — M546 Pain in thoracic spine: Secondary | ICD-10-CM | POA: Diagnosis not present

## 2019-08-07 DIAGNOSIS — M9902 Segmental and somatic dysfunction of thoracic region: Secondary | ICD-10-CM | POA: Diagnosis not present

## 2019-08-07 DIAGNOSIS — M9903 Segmental and somatic dysfunction of lumbar region: Secondary | ICD-10-CM | POA: Diagnosis not present

## 2019-08-07 DIAGNOSIS — M5136 Other intervertebral disc degeneration, lumbar region: Secondary | ICD-10-CM | POA: Diagnosis not present

## 2019-08-07 DIAGNOSIS — M546 Pain in thoracic spine: Secondary | ICD-10-CM | POA: Diagnosis not present

## 2019-08-09 DIAGNOSIS — M9903 Segmental and somatic dysfunction of lumbar region: Secondary | ICD-10-CM | POA: Diagnosis not present

## 2019-08-09 DIAGNOSIS — M5136 Other intervertebral disc degeneration, lumbar region: Secondary | ICD-10-CM | POA: Diagnosis not present

## 2019-08-09 DIAGNOSIS — M9902 Segmental and somatic dysfunction of thoracic region: Secondary | ICD-10-CM | POA: Diagnosis not present

## 2019-08-09 DIAGNOSIS — M546 Pain in thoracic spine: Secondary | ICD-10-CM | POA: Diagnosis not present

## 2019-08-13 DIAGNOSIS — M9903 Segmental and somatic dysfunction of lumbar region: Secondary | ICD-10-CM | POA: Diagnosis not present

## 2019-08-13 DIAGNOSIS — M9902 Segmental and somatic dysfunction of thoracic region: Secondary | ICD-10-CM | POA: Diagnosis not present

## 2019-08-13 DIAGNOSIS — M546 Pain in thoracic spine: Secondary | ICD-10-CM | POA: Diagnosis not present

## 2019-08-13 DIAGNOSIS — M5136 Other intervertebral disc degeneration, lumbar region: Secondary | ICD-10-CM | POA: Diagnosis not present

## 2019-08-15 DIAGNOSIS — M5136 Other intervertebral disc degeneration, lumbar region: Secondary | ICD-10-CM | POA: Diagnosis not present

## 2019-08-15 DIAGNOSIS — M9902 Segmental and somatic dysfunction of thoracic region: Secondary | ICD-10-CM | POA: Diagnosis not present

## 2019-08-15 DIAGNOSIS — M546 Pain in thoracic spine: Secondary | ICD-10-CM | POA: Diagnosis not present

## 2019-08-15 DIAGNOSIS — M9903 Segmental and somatic dysfunction of lumbar region: Secondary | ICD-10-CM | POA: Diagnosis not present

## 2019-08-19 DIAGNOSIS — M5136 Other intervertebral disc degeneration, lumbar region: Secondary | ICD-10-CM | POA: Diagnosis not present

## 2019-08-19 DIAGNOSIS — M546 Pain in thoracic spine: Secondary | ICD-10-CM | POA: Diagnosis not present

## 2019-08-19 DIAGNOSIS — M9902 Segmental and somatic dysfunction of thoracic region: Secondary | ICD-10-CM | POA: Diagnosis not present

## 2019-08-19 DIAGNOSIS — M9903 Segmental and somatic dysfunction of lumbar region: Secondary | ICD-10-CM | POA: Diagnosis not present

## 2019-08-21 ENCOUNTER — Telehealth: Payer: Self-pay | Admitting: Internal Medicine

## 2019-08-21 NOTE — Telephone Encounter (Signed)
He told me he did not want to take lipitor for cholesterol months ago so we stopped this and he started zetia  Has he been taking lipitor?   Call pt to discuss refill  Is he confused and wanting another medication which starts with the letter A?  Paul Cook

## 2019-08-21 NOTE — Telephone Encounter (Signed)
Patient is requesting refill on medication not on current med list .See encounter below

## 2019-08-21 NOTE — Telephone Encounter (Signed)
Copied from [place] [id number]. Topic: Quick Communication - Rx Refill/Question >> Aug 21, 2019  1:02 PM Izola Price, [place] A wrote: Medication: atorvastatin (LIPITOR) 10 MG tablet (Patient only has 2 pills left.)  Has the patient contacted their pharmacy? {Yes (Agent: If no, request that the patient contact the pharmacy for the refill.) (Agent: If yes, when and what did the pharmacy advise?)Contact PCP  Preferred Pharmacy (with phone number or street name): [facility] [id number], [place] [phone] (Phone) [phone] (Fax)    Agent: Please be advised that RX refills may take up to 3 business days. We ask that you follow-up with your pharmacy.

## 2019-08-22 ENCOUNTER — Ambulatory Visit: Payer: PPO | Admitting: Psychiatry

## 2019-08-22 ENCOUNTER — Other Ambulatory Visit: Payer: Self-pay

## 2019-08-23 ENCOUNTER — Telehealth: Payer: Self-pay | Admitting: Internal Medicine

## 2019-08-23 NOTE — Telephone Encounter (Signed)
He told me he did not want to take lipitor for cholesterol months ago so we stopped this and he started zetia  Has he been taking lipitor?   Call pt to discuss refill  Is he confused and wanting another medication which starts with the letter A?  [place]

## 2019-08-23 NOTE — Telephone Encounter (Signed)
Left message for patient to return call back. [facility] may give and obtain information.  

## 2019-09-11 DIAGNOSIS — M9903 Segmental and somatic dysfunction of lumbar region: Secondary | ICD-10-CM | POA: Diagnosis not present

## 2019-09-11 DIAGNOSIS — M9902 Segmental and somatic dysfunction of thoracic region: Secondary | ICD-10-CM | POA: Diagnosis not present

## 2019-09-11 DIAGNOSIS — M546 Pain in thoracic spine: Secondary | ICD-10-CM | POA: Diagnosis not present

## 2019-09-11 DIAGNOSIS — M5136 Other intervertebral disc degeneration, lumbar region: Secondary | ICD-10-CM | POA: Diagnosis not present

## 2019-09-18 DIAGNOSIS — M9902 Segmental and somatic dysfunction of thoracic region: Secondary | ICD-10-CM | POA: Diagnosis not present

## 2019-09-18 DIAGNOSIS — M5136 Other intervertebral disc degeneration, lumbar region: Secondary | ICD-10-CM | POA: Diagnosis not present

## 2019-09-18 DIAGNOSIS — M546 Pain in thoracic spine: Secondary | ICD-10-CM | POA: Diagnosis not present

## 2019-09-18 DIAGNOSIS — M9903 Segmental and somatic dysfunction of lumbar region: Secondary | ICD-10-CM | POA: Diagnosis not present

## 2019-09-19 DIAGNOSIS — M9902 Segmental and somatic dysfunction of thoracic region: Secondary | ICD-10-CM | POA: Diagnosis not present

## 2019-09-19 DIAGNOSIS — M5136 Other intervertebral disc degeneration, lumbar region: Secondary | ICD-10-CM | POA: Diagnosis not present

## 2019-09-19 DIAGNOSIS — M546 Pain in thoracic spine: Secondary | ICD-10-CM | POA: Diagnosis not present

## 2019-09-19 DIAGNOSIS — M9903 Segmental and somatic dysfunction of lumbar region: Secondary | ICD-10-CM | POA: Diagnosis not present

## 2019-10-17 ENCOUNTER — Ambulatory Visit (INDEPENDENT_AMBULATORY_CARE_PROVIDER_SITE_OTHER): Payer: PPO | Admitting: Internal Medicine

## 2019-10-17 ENCOUNTER — Encounter: Payer: Self-pay | Admitting: Internal Medicine

## 2019-10-17 ENCOUNTER — Other Ambulatory Visit: Payer: Self-pay

## 2019-10-17 VITALS — Ht 66.0 in | Wt 300.0 lb

## 2019-10-17 DIAGNOSIS — F419 Anxiety disorder, unspecified: Secondary | ICD-10-CM | POA: Diagnosis not present

## 2019-10-17 DIAGNOSIS — K589 Irritable bowel syndrome without diarrhea: Secondary | ICD-10-CM | POA: Diagnosis not present

## 2019-10-17 DIAGNOSIS — F32A Depression, unspecified: Secondary | ICD-10-CM

## 2019-10-17 DIAGNOSIS — G4700 Insomnia, unspecified: Secondary | ICD-10-CM

## 2019-10-17 DIAGNOSIS — B372 Candidiasis of skin and nail: Secondary | ICD-10-CM | POA: Diagnosis not present

## 2019-10-17 DIAGNOSIS — F329 Major depressive disorder, single episode, unspecified: Secondary | ICD-10-CM

## 2019-10-17 DIAGNOSIS — K219 Gastro-esophageal reflux disease without esophagitis: Secondary | ICD-10-CM | POA: Diagnosis not present

## 2019-10-17 DIAGNOSIS — I10 Essential (primary) hypertension: Secondary | ICD-10-CM

## 2019-10-17 DIAGNOSIS — J45909 Unspecified asthma, uncomplicated: Secondary | ICD-10-CM | POA: Diagnosis not present

## 2019-10-17 MED ORDER — DICYCLOMINE HCL 10 MG PO CAPS: 10.0000 mg | ORAL_CAPSULE | Freq: Two times a day (BID) | ORAL | 3 refills | Status: DC

## 2019-10-17 MED ORDER — GABAPENTIN 600 MG PO TABS: ORAL_TABLET | ORAL | 3 refills | Status: DC

## 2019-10-17 MED ORDER — LORAZEPAM 0.5 MG PO TABS: 0.5000 mg | ORAL_TABLET | Freq: Every evening | ORAL | 2 refills | Status: DC | PRN

## 2019-10-17 MED ORDER — LISINOPRIL-HYDROCHLOROTHIAZIDE 20-12.5 MG PO TABS: 2.0000 | ORAL_TABLET | Freq: Every day | ORAL | 3 refills | Status: DC

## 2019-10-17 MED ORDER — NYSTATIN 100000 UNIT/GM EX CREA: 1.0000 "application " | TOPICAL_CREAM | Freq: Two times a day (BID) | CUTANEOUS | 11 refills | Status: DC

## 2019-10-17 MED ORDER — ALBUTEROL SULFATE HFA 108 (90 BASE) MCG/ACT IN AERS: 1.0000 | INHALATION_SPRAY | Freq: Four times a day (QID) | RESPIRATORY_TRACT | 11 refills | Status: DC | PRN

## 2019-10-17 MED ORDER — SERTRALINE HCL 50 MG PO TABS: 50.0000 mg | ORAL_TABLET | Freq: Every day | ORAL | 3 refills | Status: DC

## 2019-10-17 MED ORDER — FAMOTIDINE 20 MG PO TABS: 20.0000 mg | ORAL_TABLET | Freq: Every day | ORAL | 3 refills | Status: DC

## 2019-10-17 NOTE — Progress Notes (Signed)
Virtual Visit via Video Note  I connected with Paul Cook   on 10/17/19 at  8:50 AM EST by a video enabled telemedicine application and verified that I am speaking with the correct person using two identifiers.  Location patient: home Location provider:work or home office Persons participating in the virtual visit: patient, provider  I discussed the limitations of evaluation and management by telemedicine and the availability of in person appointments. The patient expressed understanding and agreed to proceed.   HPI: 1. HTN on lis hct 20-12.5 2 tabs qd and norvasc 5 mg qd not checked BP today  2. Anxiety/depression phq 9 score 5 today with insomnia but improved sleeping 8 hrs at night has not f/u [facility] psych but given # sleep improved due to no I pad use at night and urinating less 5 hrs into sleep instead of every 2 hours  3. [facility]trigo wants refill of nystatin   ROS: See pertinent positives and negatives per HPI.  Past Medical History:  Diagnosis Date  . Adenomatous colon polyp    tubular  . Adenomatous rectal polyp    tubular  . Asthma   . Depression   . Diverticulosis   . GERD (gastroesophageal reflux disease)   . History of chicken pox   . Hyperlipidemia   . Hypertension   . Lung nodule    CT 10/2014  . OSA (obstructive sleep apnea)   . Sleep apnea    wears cpap  . Vitamin D deficiency     Past Surgical History:  Procedure Laterality Date  . COLONOSCOPY    . COLONOSCOPY W/ POLYPECTOMY  2015   [facility], benign  . HERNIA REPAIR     inguinal  . POLYPECTOMY    . TONSILLECTOMY     age 52     Family History  Problem Relation Age of Onset  . Depression Mother   . Hypertension Father   . Heart disease Father   . Stroke Father   . Cancer Maternal Grandmother        ovarian?  . Cancer Paternal Grandfather        prostate  . Prostate cancer Paternal Grandfather   . Colon cancer Neg Hx   . Colon polyps Neg Hx     SOCIAL HX: married lives with wife    Current  Outpatient Medications:  .  amLODipine (NORVASC) 5 MG tablet, Take 1 tablet (5 mg total) by mouth daily., Disp: 90 tablet, Rfl: 3 .  Cholecalciferol 50 MCG (2000 UT) CAPS, Take 2,000 Units by mouth., Disp: , Rfl:  .  ezetimibe (ZETIA) 10 MG tablet, Take 1 tablet (10 mg total) by mouth daily., Disp: 90 tablet, Rfl: 3 .  famotidine (PEPCID) 20 MG tablet, Take 1 tablet (20 mg total) by mouth daily., Disp: 90 tablet, Rfl: 3 .  gabapentin (NEURONTIN) 600 MG tablet, TAKE 1 TABLET(600 MG) BY MOUTH THREE TIMES DAILY, Disp: 270 tablet, Rfl: 3 .  lisinopril-hydrochlorothiazide (ZESTORETIC) 20-12.5 MG tablet, Take 2 tablets by mouth daily., Disp: 180 tablet, Rfl: 3 .  LORazepam (ATIVAN) 0.5 MG tablet, Take 1-2 tablets (0.5-1 mg total) by mouth at bedtime as needed for anxiety., Disp: 60 tablet, Rfl: 2 .  rOPINIRole (REQUIP) 2 MG tablet, Take 3 tablets (6 mg total) by mouth at bedtime., Disp: 270 tablet, Rfl: 3 .  sertraline (ZOLOFT) 50 MG tablet, Take 1 tablet (50 mg total) by mouth daily., Disp: 90 tablet, Rfl: 3 .  sildenafil (REVATIO) 20 MG tablet, 1-5 tablets  before intercourse 30 minutes to 4 hrs, Disp: 50 tablet, Rfl: 11 .  trazodone (DESYREL) 300 MG tablet, Take 1 tablet (300 mg total) by mouth at bedtime., Disp: 31 tablet, Rfl: 5 .  albuterol (VENTOLIN HFA) 108 (90 Base) MCG/ACT inhaler, Inhale 1-2 puffs into the lungs every 6 (six) hours as needed for wheezing or shortness of breath., Disp: 18 g, Rfl: 11 .  dicyclomine (BENTYL) 10 MG capsule, Take 1 capsule (10 mg total) by mouth 2 (two) times daily., Disp: 180 capsule, Rfl: 3 .  nystatin cream (MYCOSTATIN), Apply 1 application topically 2 (two) times daily., Disp: 90 g, Rfl: 11  EXAM:  VITALS per patient if applicable:  GENERAL: alert, oriented, appears well and in no acute distress  HEENT: atraumatic, conjunttiva clear, no obvious abnormalities on inspection of external nose and ears  NECK: normal movements of the head and neck  LUNGS: on  inspection no signs of respiratory distress, breathing rate appears normal, no obvious gross SOB, gasping or wheezing  CV: no obvious cyanosis  MS: moves all visible extremities without noticeable abnormality  PSYCH/NEURO: pleasant and cooperative, no obvious depression or anxiety, speech and thought processing grossly intact  ASSESSMENT AND PLAN:  Discussed the following assessment and plan:  Essential hypertension - Plan: lisinopril-hydrochlorothiazide (ZESTORETIC) 20-12.5 MG tablet, norvasc 5 cont  Monitor bp   Mild asthma without complication, unspecified whether persistent - Plan: albuterol (VENTOLIN HFA) 108 (90 Base) MCG/ACT inhaler  Irritable bowel syndrome, unspecified type - Plan: dicyclomine (BENTYL) 10 MG capsule  Gastroesophageal reflux disease - Plan: famotidine (PEPCID) 20 MG tablet  Anxiety - Plan: LORazepam (ATIVAN) 0.5 MG tablet  Insomnia, unspecified type - Plan: LORazepam (ATIVAN) 0.5 MG tablet Anxiety and depression - Plan: sertraline (ZOLOFT) 50 MG tablet  Candidal dermatitis - Plan: nystatin cream (MYCOSTATIN)  HM Flu shot utd tdap utd, prevnar, pna 23  Consider shingrix in future  Never smoker Nl PSA1.04 07/09/19  Colonoscopy h/o multiple polyps due 04/12/2019 [facility] -pt wants to holdas of 10/17/2019 Consider derm in future notneeded now  MMR immune and hep C neg rec healthy diet and exercise  -we discussed possible serious and likely etiologies, options for evaluation and workup, limitations of telemedicine visit vs in person visit, treatment, treatment risks and precautions. Pt prefers to treat via telemedicine empirically rather then risking or undertaking an in person visit at this moment. Patient agrees to seek prompt in person care if worsening, new symptoms arise, or if is not improving with treatment.   I discussed the assessment and treatment plan with the patient. The patient was provided an opportunity to ask questions and all were  answered. The patient agreed with the plan and demonstrated an understanding of the instructions.   The patient was advised to call back or seek an in-person evaluation if the symptoms worsen or if the condition fails to improve as anticipated.  Time spent 25 minutes  Delorise Jackson, MD

## 2019-10-23 ENCOUNTER — Ambulatory Visit: Payer: PPO | Admitting: Internal Medicine

## 2019-11-18 ENCOUNTER — Ambulatory Visit (INDEPENDENT_AMBULATORY_CARE_PROVIDER_SITE_OTHER): Payer: PPO | Admitting: Family Medicine

## 2019-11-18 ENCOUNTER — Other Ambulatory Visit: Payer: Self-pay

## 2019-11-18 ENCOUNTER — Other Ambulatory Visit: Payer: Self-pay | Admitting: Family Medicine

## 2019-11-18 ENCOUNTER — Encounter: Payer: Self-pay | Admitting: Family Medicine

## 2019-11-18 VITALS — BP 130/78 | HR 68 | Temp 97.8°F | Ht 66.0 in | Wt 313.5 lb

## 2019-11-18 DIAGNOSIS — M25571 Pain in right ankle and joints of right foot: Secondary | ICD-10-CM

## 2019-11-18 NOTE — Progress Notes (Signed)
Subjective:     Paul Cook is a 69 y.o. male presenting for Foot Swelling (x 2 days. right foot.)     HPI   #Foot pain issues - right foot - under the ankle - towards the back is getting a slight mild sharp pain - denies pins and needles - constant pressure - no injury - has been dangling his feet more - no swelling in the legs - does have swelling in the right foot - which is new - ankle symptom worse with walking and bearing weight - ambulating with a cane x 3 days - seems to be getting worse - Treatment: wrapping in Ace bandage    Review of Systems  Respiratory: Negative for chest tightness and shortness of breath.   Cardiovascular: Negative for chest pain and palpitations.  Neurological: Negative for weakness and numbness.     Social History   Tobacco Use  Smoking Status Never Smoker  Smokeless Tobacco Never Used        Objective:    BP Readings from Last 3 Encounters:  11/18/19 130/78  11/09/18 (!) 158/90  09/06/18 140/82   Wt Readings from Last 3 Encounters:  11/18/19 (!) 313 lb 8 oz (142.2 kg)  10/17/19 300 lb (136.1 kg)  11/09/18 298 lb (135.2 kg)    BP 130/78   Pulse 68   Temp 97.8 F (36.6 C)   Ht 5\' 6"  (1.676 m)   Wt (!) 313 lb 8 oz (142.2 kg)   SpO2 95%   BMI 50.60 kg/m    Physical Exam Constitutional:      Appearance: Normal appearance. He is obese. He is not ill-appearing or diaphoretic.  HENT:     Right Ear: External ear normal.     Left Ear: External ear normal.     Nose: Nose normal.  Eyes:     General: No scleral icterus.    Extraocular Movements: Extraocular movements intact.     Conjunctiva/sclera: Conjunctivae normal.  Cardiovascular:     Rate and Rhythm: Normal rate.  Pulmonary:     Effort: Pulmonary effort is normal.  Musculoskeletal:     Cervical back: Neck supple.     Right lower leg: Edema (1+ to mid calf) present.     Left lower leg: Edema (trace to mid calf) present.     Comments: Right ankle:    Inspection: 1-2+ edema, no erythema Palpation: TTP below the lateral malleolus, otherwise no bone TTP ROM: normal Strength: normal Ligaments: normal    Skin:    General: Skin is warm and dry.  Neurological:     Mental Status: He is alert. Mental status is at baseline.  Psychiatric:        Mood and Affect: Mood normal.      XR Right ankle: 09/06/2018 -- medial joint degeneration     Assessment & Plan:   Problem List Items Addressed This Visit      Other   Acute right ankle pain - Primary    Pt with hx of arthritis in this ankle and now with onset of pain. No injury and no boney tenderness. Discussed with swelling cannot completely rule out clot. Pt will elevated x 1-2 days and if swelling not improving will get [place]. Offered [place] today and will plan for watch and wait given predominately ankle/foot pain clot less likely. ER precautions discussed for CP, SOB, tachycardia. If improvement in swelling but ankle pain not improving return in 1-2 weeks.  Return in about 1 week (around 11/25/2019) for if not improved.  Lesleigh Noe, MD

## 2019-11-18 NOTE — Assessment & Plan Note (Signed)
Pt with hx of arthritis in this ankle and now with onset of pain. No injury and no boney tenderness. Discussed with swelling cannot completely rule out clot. Pt will elevated x 1-2 days and if swelling not improving will get [place]. Offered [place] today and will plan for watch and wait given predominately ankle/foot pain clot less likely. ER precautions discussed for CP, SOB, tachycardia. If improvement in swelling but ankle pain not improving return in 1-2 weeks.

## 2019-11-18 NOTE — Patient Instructions (Signed)
#  Right Ankle Pain - Use an ankle brace for support - elevate your leg -- this means using a pillow to prop your leg above your heart when you are laying down - avoid a seated position where your legs are dangling down - if the swelling on the right side is worse than the left side in 2 days call back > we would want to get an ultrasound to look for a blood clot  Go to the ER if you develop sudden onset chest pain, breathing difficulty, extremely fast heart rate

## 2019-11-25 ENCOUNTER — Telehealth: Payer: Self-pay | Admitting: Family Medicine

## 2019-11-25 NOTE — Telephone Encounter (Signed)
I left message for patient to return phone call.   

## 2019-11-25 NOTE — Telephone Encounter (Signed)
Pt called with update on his foot. He saw Dr. Einar Pheasant on 2/1 for this. He said it started to improve but it's starting to swell up again. He is requesting a call to discuss this.

## 2019-11-26 NOTE — Telephone Encounter (Signed)
Would probably recommend an Ultrasound to rule out clot. Can order if patient is interested. Otherwise, would recommend office visit to evaluate.

## 2019-11-26 NOTE — Telephone Encounter (Signed)
Called today no answer. Went straight to [facility]. Per epic patient has an appointment with PCP tomorrow-11/27/19

## 2019-11-27 ENCOUNTER — Encounter: Payer: Self-pay | Admitting: Internal Medicine

## 2019-11-27 ENCOUNTER — Other Ambulatory Visit: Payer: Self-pay

## 2019-11-27 ENCOUNTER — Ambulatory Visit (INDEPENDENT_AMBULATORY_CARE_PROVIDER_SITE_OTHER): Payer: PPO | Admitting: Internal Medicine

## 2019-11-27 ENCOUNTER — Telehealth: Payer: Self-pay | Admitting: Internal Medicine

## 2019-11-27 VITALS — Ht 60.0 in | Wt 313.0 lb

## 2019-11-27 DIAGNOSIS — R413 Other amnesia: Secondary | ICD-10-CM | POA: Diagnosis not present

## 2019-11-27 DIAGNOSIS — Z1329 Encounter for screening for other suspected endocrine disorder: Secondary | ICD-10-CM | POA: Diagnosis not present

## 2019-11-27 DIAGNOSIS — I10 Essential (primary) hypertension: Secondary | ICD-10-CM

## 2019-11-27 DIAGNOSIS — R7303 Prediabetes: Secondary | ICD-10-CM

## 2019-11-27 DIAGNOSIS — M19071 Primary osteoarthritis, right ankle and foot: Secondary | ICD-10-CM | POA: Diagnosis not present

## 2019-11-27 MED ORDER — DICLOFENAC SODIUM 1 % EX GEL: 2.0000 g | Freq: Four times a day (QID) | CUTANEOUS | 11 refills | Status: DC

## 2019-11-27 NOTE — Progress Notes (Addendum)
Virtual Visit via Video Note  I connected with Paul Cook  on 11/27/19 at 10:15 AM EST by a video enabled telemedicine application and verified that I am speaking with the correct person using two identifiers.  Location patient: home Location provider:work or home office Persons participating in the virtual visit: patient, provider  I discussed the limitations of evaluation and management by telemedicine and the availability of in person appointments. The patient expressed understanding and agreed to proceed.   HPI: 1. C/o right inner ankle pain chronically Xray 08/2018 +arthritis and right heel spur denies heal pain pain 2/10 inner pain no pain with ROM but hurt with walking and bearing weight tried ACE wrap now is helping but ankle support too tight and did not fit Declines poditary for now  Swelling to anke improved   2. HTN 130/78 last visit hasnt been able to check BP needs to buy meter  3. C/o memory loss short term wants to see neurology referral will be placed   ROS: See pertinent positives and negatives per HPI.  Past Medical History:  Diagnosis Date  . Adenomatous colon polyp    tubular  . Adenomatous rectal polyp    tubular  . Asthma   . Depression   . Diverticulosis   . GERD (gastroesophageal reflux disease)   . History of chicken pox   . Hyperlipidemia   . Hypertension   . Lung nodule    CT 10/2014  . OSA (obstructive sleep apnea)   . Sleep apnea    wears cpap  . Vitamin D deficiency     Past Surgical History:  Procedure Laterality Date  . COLONOSCOPY    . COLONOSCOPY W/ POLYPECTOMY  2015   [facility], benign  . HERNIA REPAIR     inguinal  . POLYPECTOMY    . TONSILLECTOMY     age 50     Family History  Problem Relation Age of Onset  . Depression Mother   . Hypertension Father   . Heart disease Father   . Stroke Father   . Cancer Maternal Grandmother        ovarian?  . Cancer Paternal Grandfather        prostate  . Prostate cancer Paternal  Grandfather   . Colon cancer Neg Hx   . Colon polyps Neg Hx     SOCIAL HX: Lives in [place] with wife, [name] 23 years as of 10/2018. 1 daughter in [place].  Work - Optician, dispensing, now Animal nutritionist  From [employer]   Current Outpatient Medications:  .  albuterol (VENTOLIN HFA) 108 (90 Base) MCG/ACT inhaler, Inhale 1-2 puffs into the lungs every 6 (six) hours as needed for wheezing or shortness of breath., Disp: 18 g, Rfl: 11 .  amLODipine (NORVASC) 5 MG tablet, Take 1 tablet (5 mg total) by mouth daily., Disp: 90 tablet, Rfl: 3 .  Cholecalciferol 50 MCG (2000 UT) CAPS, Take 2,000 Units by mouth., Disp: , Rfl:  .  dicyclomine (BENTYL) 10 MG capsule, Take 1 capsule (10 mg total) by mouth 2 (two) times daily., Disp: 180 capsule, Rfl: 3 .  ezetimibe (ZETIA) 10 MG tablet, Take 1 tablet (10 mg total) by mouth daily., Disp: 90 tablet, Rfl: 3 .  famotidine (PEPCID) 20 MG tablet, Take 1 tablet (20 mg total) by mouth daily., Disp: 90 tablet, Rfl: 3 .  gabapentin (NEURONTIN) 600 MG tablet, TAKE 1 TABLET(600 MG) BY MOUTH THREE TIMES DAILY, Disp: 270 tablet, Rfl: 3 .  lisinopril-hydrochlorothiazide (ZESTORETIC)  20-12.5 MG tablet, Take 2 tablets by mouth daily., Disp: 180 tablet, Rfl: 3 .  LORazepam (ATIVAN) 0.5 MG tablet, Take 1-2 tablets (0.5-1 mg total) by mouth at bedtime as needed for anxiety., Disp: 60 tablet, Rfl: 2 .  nystatin cream (MYCOSTATIN), Apply 1 application topically 2 (two) times daily., Disp: 90 g, Rfl: 11 .  rOPINIRole (REQUIP) 2 MG tablet, Take 3 tablets (6 mg total) by mouth at bedtime., Disp: 270 tablet, Rfl: 3 .  sertraline (ZOLOFT) 50 MG tablet, Take 1 tablet (50 mg total) by mouth daily., Disp: 90 tablet, Rfl: 3 .  sildenafil (REVATIO) 20 MG tablet, 1-5 tablets before intercourse 30 minutes to 4 hrs, Disp: 50 tablet, Rfl: 11 .  trazodone (DESYREL) 300 MG tablet, Take 1 tablet (300 mg total) by mouth at bedtime., Disp: 31 tablet, Rfl: 5 .  diclofenac Sodium (VOLTAREN) 1 %  GEL, Apply 2-4 g topically 4 (four) times daily., Disp: 150 g, Rfl: 11  EXAM:  VITALS per patient if applicable:  GENERAL: alert, oriented, appears well and in no acute distress  HEENT: atraumatic, conjunttiva clear, no obvious abnormalities on inspection of external nose and ears  NECK: normal movements of the head and neck  LUNGS: on inspection no signs of respiratory distress, breathing rate appears normal, no obvious gross SOB, gasping or wheezing  CV: no obvious cyanosis  MS: moves all visible extremities without noticeable abnormality  PSYCH/NEURO: pleasant and cooperative, no obvious depression or anxiety, speech and thought processing grossly intact  ASSESSMENT AND PLAN:  Discussed the following assessment and plan:  Osteoarthritis of right ankle, unspecified osteoarthritis type - Plan: diclofenac Sodium (VOLTAREN) 1 % GEL Tylenol  Warm soaks  Tumeric   Essential hypertension - Plan: Comprehensive metabolic panel, Lipid panel, CBC with Differential/Platelet Cont meds  Buy BP cuff   Prediabetes - Plan: Hemoglobin A1c  HM Flu shot utd tdap utd, prevnar, pna 23  Consider shingrix in future  Never smoker Nl PSA1.04 07/09/19 Colonoscopy h/o multiple polyps due 04/12/2019 [facility] -pt wants to holdas of 10/17/2019 Consider derm in future notneeded now  MMR immune and hep C neg rec healthy diet and exercise  -we discussed possible serious and likely etiologies, options for evaluation and workup, limitations of telemedicine visit vs in person visit, treatment, treatment risks and precautions. Pt prefers to treat via telemedicine empirically rather then risking or undertaking an in person visit at this moment. Patient agrees to seek prompt in person care if worsening, new symptoms arise, or if is not improving with treatment.   I discussed the assessment and treatment plan with the patient. The patient was provided an opportunity to ask questions and all were  answered. The patient agreed with the plan and demonstrated an understanding of the instructions.   The patient was advised to call back or seek an in-person evaluation if the symptoms worsen or if the condition fails to improve as anticipated.  Time spent 20 minutes  Delorise Jackson, MD

## 2019-11-27 NOTE — Telephone Encounter (Signed)
Please advise 

## 2019-11-27 NOTE — Addendum Note (Signed)
Addended by: Orland Mustard on: 11/27/2019 11:23 PM   Modules accepted: Orders

## 2019-11-27 NOTE — Telephone Encounter (Signed)
Pt said he forgot to mention in the phone call with Dr. [name] that he is experiencing memory loss when he has conversations and everyday occurrences. I told him I would send the message.

## 2019-11-27 NOTE — Telephone Encounter (Signed)
Does he want to see psychiatry or neurology?   [place]

## 2019-11-27 NOTE — Telephone Encounter (Signed)
Patient would like to see Neurologist with [facility]. Prefer [facility] if possible

## 2019-11-27 NOTE — Patient Instructions (Signed)
Warm soaks for your foot in epsolm salt  Tylenol as needed  Petra Kuba wise tumeric with ginger and curcumin  voltaren gel up to 4x per day   Arthritis Arthritis is a term that is commonly used to refer to joint pain or joint disease. There are more than 100 types of arthritis. What are the causes? The most common cause of this condition is wear and tear of a joint. Other causes include:  Gout.  Inflammation of a joint.  An infection of a joint.  Sprains and other injuries near the joint.  A reaction to medicines or drugs, or an allergic reaction. In some cases, the cause may not be known. What are the signs or symptoms? The main symptom of this condition is pain in the joint during movement. Other symptoms include:  Redness, swelling, or stiffness at a joint.  Warmth coming from the joint.  Fever.  Overall feeling of illness. How is this diagnosed? This condition may be diagnosed with a physical exam and tests, including:  Blood tests.  Urine tests.  Imaging tests, such as X-rays, an MRI, or a CT scan. Sometimes, fluid is removed from a joint for testing. How is this treated? This condition may be treated with:  Treatment of the cause, if it is known.  Rest.  Raising (elevating) the joint.  Applying cold or hot packs to the joint.  Medicines to improve symptoms and reduce inflammation.  Injections of a steroid such as cortisone into the joint to help reduce pain and inflammation. Depending on the cause of your arthritis, you may need to make lifestyle changes to reduce stress on your joint. Changes may include:  Exercising more.  Losing weight. Follow these instructions at home: Medicines  Take over-the-counter and prescription medicines only as told by your health care provider.  Do not take aspirin to relieve pain if your health care provider thinks that gout may be causing your pain. Activity  Rest your joint if told by your health care provider. Rest  is important when your disease is active and your joint feels painful, swollen, or stiff.  Avoid activities that make the pain worse. It is important to balance activity with rest.  Exercise your joint regularly with range-of-motion exercises as told by your health care provider. Try doing low-impact exercise, such as: ? Swimming. ? Water aerobics. ? Biking. ? Walking. Managing pain, stiffness, and swelling      If directed, put ice on the joint. ? Put ice in a plastic bag. ? Place a towel between your skin and the bag. ? Leave the ice on for 20 minutes, 2-3 times per day.  If your joint is swollen, raise (elevate) it above the level of your heart if directed by your health care provider.  If your joint feels stiff in the morning, try taking a warm shower.  If directed, apply heat to the affected area as often as told by your health care provider. Use the heat source that your health care provider recommends, such as a moist heat pack or a heating pad. If you have diabetes, do not apply heat without permission from your health care provider. To apply heat: ? Place a towel between your skin and the heat source. ? Leave the heat on for 20-30 minutes. ? Remove the heat if your skin turns bright red. This is especially important if you are unable to feel pain, heat, or cold. You may have a greater risk of getting burned. General instructions  Do not use any products that contain nicotine or tobacco, such as cigarettes, e-cigarettes, and chewing tobacco. If you need help quitting, ask your health care provider.  Keep all follow-up visits as told by your health care provider. This is important. Contact a health care provider if:  The pain gets worse.  You have a fever. Get help right away if:  You develop severe joint pain, swelling, or redness.  Many joints become painful and swollen.  You develop severe back pain.  You develop severe weakness in your leg.  You cannot  control your bladder or bowels. Summary  Arthritis is a term that is commonly used to refer to joint pain or joint disease. There are more than 100 types of arthritis.  The most common cause of this condition is wear and tear of a joint. Other causes include gout, inflammation or infection of the joint, sprains, or allergies.  Symptoms of this condition include redness, swelling, or stiffness of the joint. Other symptoms include warmth, fever, or feeling ill.  This condition is treated with rest, elevation, medicines, and applying cold or hot packs.  Follow your health care provider's instructions about medicines, activity, exercises, and other home care treatments. This information is not intended to replace advice given to you by your health care provider. Make sure you discuss any questions you have with your health care provider. Document Revised: 09/10/2018 Document Reviewed: 09/10/2018 Elsevier Patient Education  2020 [employer].

## 2019-12-02 ENCOUNTER — Ambulatory Visit (INDEPENDENT_AMBULATORY_CARE_PROVIDER_SITE_OTHER): Payer: PPO | Admitting: Family Medicine

## 2019-12-02 ENCOUNTER — Encounter: Payer: Self-pay | Admitting: Family Medicine

## 2019-12-02 ENCOUNTER — Other Ambulatory Visit: Payer: Self-pay

## 2019-12-02 VITALS — BP 140/84 | HR 78 | Temp 98.7°F | Ht 66.0 in | Wt 313.8 lb

## 2019-12-02 DIAGNOSIS — M76821 Posterior tibial tendinitis, right leg: Secondary | ICD-10-CM | POA: Diagnosis not present

## 2019-12-02 DIAGNOSIS — M7671 Peroneal tendinitis, right leg: Secondary | ICD-10-CM

## 2019-12-02 MED ORDER — PREDNISONE 20 MG PO TABS: ORAL_TABLET | ORAL | 0 refills | Status: DC

## 2019-12-02 NOTE — Progress Notes (Signed)
T. , MD Primary Care and Sports Medicine [facility] at [facility] [place], [address] Phone: [phone]  FAX: [phone]  Paul Cook - 69 y.o. male  MRN [id number]  Date of Birth: 1951-04-01  Visit Date: 12/02/2019  PCP: Paul Cook, Paul Glow, MD  Referred by: Paul Cook *  Chief Complaint  Patient presents with  . Ankle Pain    Right    This visit occurred during the SARS-CoV-2 public health emergency.  Safety protocols were in place, including screening questions prior to the visit, additional usage of staff PPE, and extensive cleaning of exam room while observing appropriate contact time as indicated for disinfecting solutions.   Subjective:   Paul Cook is a 69 y.o. very pleasant male patient with Body mass index is 50.64 kg/m. who presents with the following:  This is the first time I have evaluated the patient.  I have been asked to see the patient in consultation with some ongoing right-sided ankle pain.  He has had some left-sided ankle pain ongoing for greater than 1 year.  He has tried an Ace bandage, but this has persisted.  Today, he is more worried about some medial aspect of his ankle which also displays some swelling.  He has not had any specific trauma or injury to his ankle.  On the medial aspect of his ankle this is been present for approximately 2 weeks.  He has had to use a cane for 2 weeks, which is different for him.  He also does have a BMI of 51, and a weight of 313 pounds.  He has tried some Tylenol, turmeric, as well as some Voltaren gel.  11/18/2019, ov or ankle pain and the saw PCP.  New onset medial anlke for about two weeks.   Has improved some, but in the end it will swell up some. In the house, but   Has been using a cane for 2 weeks.   Motrin is OK  The patient's plain films from November 2019 were reviewed by me independently.  There is not an adequate mortise  view.  The mortise does appear intact with no significant joint space narrowing.  There is no significant osteoarthritic change at the talar dome.  There is no acute fracture or dislocation.  There is a very small calcaneal spur.  Globally, there is minimal to no osteoarthritic change at the ankle on this plain film.  There is a slight medial malleoli are irregularity which could represent prior injury.Electronically Signed  By: Paul Loffler, MD On: 12/02/2019 11:40 AM EST   Review of Systems is noted in the HPI, as appropriate   Objective:   BP 140/84   Pulse 78   Temp 98.7 F (37.1 C) (Temporal)   Ht 5\' 6"  (1.676 m)   Wt (!) 313 lb 12 oz (142.3 kg)   SpO2 95%   BMI 50.64 kg/m   GEN: No acute distress; alert,appropriate. PULM: Breathing comfortably in no respiratory distress PSYCH: Normally interactive.    He does have a mild limp.  The forefoot is entirely nontender.  Nontender at the talus.  Nontender at the fifth metatarsal, nontender at the navicular and the cuboid.  All cuneiforms are nontender.  Nontender at the malleolus medially as well as laterally.  Nontender around the entirety of the tibia and fibula.  Specifically tender along the course of the peroneal tendon.  He also has some significant tenderness along the posterior tibialis  tendon as well going into the tibialis musculature medially.  Nontender at the Achilles and nontender at the calcaneus as well as the plantar aspect of the calcaneus.    Radiology: No results found.  Assessment and Plan:     ICD-10-CM   1. Tibialis tendinitis of right lower extremity  M76.821   2. Peroneal tendinitis of lower leg, right  M76.71     Classical posterior tibialis tendinopathy as well as chronic peroneal tendinopathy.  I will treat as such.  Radiographically there is no significant osteoarthritis in the patient's ankle.   I placed the patient in an ASO ankle brace, and he is going to wear this for 2 weeks and then titrate  out of it.  This may be a good fit for him long-term when he is up on his feet for extended periods of time.  I am also going to pulse him with some oral steroids to hope fully calm down both of these tendons.  Continue with Voltaren gel.  Patient Instructions  Wear brace for 2 weeks, then only when you are on your feet a lot.  Posterior Tib and arch rehab  Towel "Scrunch Ups" Use a hand towel or a moderate size towel Foot flat down on the towel Use toes to "scrunch up the towel" straight up and down, and going to the right and left.  3 sets of 20 * Can be done watching TV, reading, or sitting and relaxing.  Also can practice picking up a small ball with your toes - marble    Follow-up: Return in about 1 month (around 12/30/2019).  Meds ordered this encounter  Medications  . predniSONE (DELTASONE) 20 MG tablet    Sig: 2 tabs po daily for 5 days, then 1 tab po daily for 5 days    Dispense:  15 tablet    Refill:  0   There are no discontinued medications. No orders of the defined types were placed in this encounter.   Signed,  Paul Cook. , MD   Outpatient Encounter Medications as of 12/02/2019  Medication Sig  . albuterol (VENTOLIN HFA) 108 (90 Base) MCG/ACT inhaler Inhale 1-2 puffs into the lungs every 6 (six) hours as needed for wheezing or shortness of breath.  Marland Kitchen amLODipine (NORVASC) 5 MG tablet Take 1 tablet (5 mg total) by mouth daily.  . Cholecalciferol 50 MCG (2000 UT) CAPS Take 2,000 Units by mouth.  . diclofenac Sodium (VOLTAREN) 1 % GEL Apply 2-4 g topically 4 (four) times daily.  Marland Kitchen dicyclomine (BENTYL) 10 MG capsule Take 1 capsule (10 mg total) by mouth 2 (two) times daily.  Marland Kitchen ezetimibe (ZETIA) 10 MG tablet Take 1 tablet (10 mg total) by mouth daily.  . famotidine (PEPCID) 20 MG tablet Take 1 tablet (20 mg total) by mouth daily.  Marland Kitchen gabapentin (NEURONTIN) 600 MG tablet TAKE 1 TABLET(600 MG) BY MOUTH THREE TIMES DAILY  . lisinopril-hydrochlorothiazide  (ZESTORETIC) 20-12.5 MG tablet Take 2 tablets by mouth daily.  Marland Kitchen LORazepam (ATIVAN) 0.5 MG tablet Take 1-2 tablets (0.5-1 mg total) by mouth at bedtime as needed for anxiety.  Marland Kitchen nystatin cream (MYCOSTATIN) Apply 1 application topically 2 (two) times daily.  Marland Kitchen rOPINIRole (REQUIP) 2 MG tablet Take 3 tablets (6 mg total) by mouth at bedtime.  . sertraline (ZOLOFT) 50 MG tablet Take 1 tablet (50 mg total) by mouth daily.  . sildenafil (REVATIO) 20 MG tablet 1-5 tablets before intercourse 30 minutes to 4 hrs  . trazodone (DESYREL)  300 MG tablet Take 1 tablet (300 mg total) by mouth at bedtime.  . predniSONE (DELTASONE) 20 MG tablet 2 tabs po daily for 5 days, then 1 tab po daily for 5 days   No facility-administered encounter medications on file as of 12/02/2019.

## 2019-12-02 NOTE — Patient Instructions (Signed)
Wear brace for 2 weeks, then only when you are on your feet a lot.  Posterior Tib and arch rehab  Towel "Scrunch Ups" Use a hand towel or a moderate size towel Foot flat down on the towel Use toes to "scrunch up the towel" straight up and down, and going to the right and left.  3 sets of 20 * Can be done watching TV, reading, or sitting and relaxing.  Also can practice picking up a small ball with your toes - marble

## 2019-12-16 ENCOUNTER — Telehealth: Payer: Self-pay | Admitting: Internal Medicine

## 2019-12-16 NOTE — Telephone Encounter (Signed)
Patient's appointment was scheduled for 12/17/2019 @ 11:30 am.  ,cma

## 2019-12-16 NOTE — Telephone Encounter (Signed)
Pt called in and said he is having swelling in his ankle for 3 weeks. No availability here or [facility]. Sent to [facility] for Triage.

## 2019-12-16 NOTE — Telephone Encounter (Signed)
This patient has seen 3 providers for this. Most recently he saw Dr Lorelei Pont (sports medicine) who treated him for tendinitis. Has he been wearing the brace he provided? Did he complete the steroids? Has he been doing the exercises? I am happy to see him to evaluate and see if anything else needs to be done.

## 2019-12-16 NOTE — Telephone Encounter (Signed)
Patient seen for right ankle pain and swelling on 12/02/19. Patient was told that if this did not get better that he needed to be seen again and have an ultrasound done. \  Patient states the ankle is still swollen but there is no pain.   You have an 11:30 slot open tomorrow morning that has been placed on hold for this patient. Okay to have patient scheduled to be seen?

## 2019-12-16 NOTE — Telephone Encounter (Signed)
Spoke with patient and he states he wears the brace, finished the steroid, and does the exercises. Patient states this helped for a while and the swelling started to go down but then flared back up.   Patient screened and is agreeable to come in at 11:30 am tomorrow. Slot has been placed on hold for the patient however it is restricted and I can not schedule this myself. Please schedule this patient, thank you.

## 2019-12-17 ENCOUNTER — Encounter: Payer: Self-pay | Admitting: Family Medicine

## 2019-12-17 ENCOUNTER — Ambulatory Visit (INDEPENDENT_AMBULATORY_CARE_PROVIDER_SITE_OTHER): Payer: PPO | Admitting: Family Medicine

## 2019-12-17 ENCOUNTER — Other Ambulatory Visit: Payer: Self-pay

## 2019-12-17 VITALS — BP 140/80 | HR 92 | Temp 97.3°F | Ht 66.0 in | Wt 318.0 lb

## 2019-12-17 DIAGNOSIS — M25471 Effusion, right ankle: Secondary | ICD-10-CM | POA: Diagnosis not present

## 2019-12-17 NOTE — Assessment & Plan Note (Signed)
This has been an ongoing issue for about a month.  I have a pretty low suspicion for DVT given it is isolated to his ankle.  Patient is interested in ruling this out.  Will obtain a D-dimer.  If it is elevated we will get an ultrasound.  If it is negative it is very unlikely to be blood clot related.  Suspect related to arthritis or tendinitis.  He will keep his appointment with Dr. Lorelei Pont later this month.

## 2019-12-17 NOTE — Progress Notes (Signed)
  Tommi Rumps, MD Phone: [phone]  Paul Cook is a 69 y.o. male who presents today for follow-up.  Right ankle swelling: Patient notes this has been swollen for about a month or so.  Its in the medial aspect of his ankle.  The initial physician that he saw mentioned that they could not fully rule out a blood clot without further evaluation and did recommend that he be evaluated for this if this did not improve.  He subsequently saw his PCP and and sports medicine.  Sports medicine felt it was related to tendinitis.  Patient notes when he has been elevating his foot and ankle and doing the exercises provided by sports medicine the swelling has gone down some.  He notes no pain.  He notes no foot swelling or proximal leg swelling.  Social History   Tobacco Use  Smoking Status Never Smoker  Smokeless Tobacco Never Used     ROS see history of present illness  Objective  Physical Exam Vitals:   12/17/19 1155  BP: 140/80  Pulse: 92  Temp: (!) 97.3 F (36.3 C)  SpO2: 95%    BP Readings from Last 3 Encounters:  12/17/19 140/80  12/02/19 140/84  11/18/19 130/78   Wt Readings from Last 3 Encounters:  12/17/19 (!) 318 lb (144.2 kg)  12/02/19 (!) 313 lb 12 oz (142.3 kg)  11/27/19 (!) 313 lb (142 kg)    Physical Exam Constitutional:      General: He is not in acute distress.    Appearance: He is not diaphoretic.  Cardiovascular:     Rate and Rhythm: Normal rate and regular rhythm.     Heart sounds: Normal heart sounds.  Pulmonary:     Effort: Pulmonary effort is normal.     Breath sounds: Normal breath sounds.  Musculoskeletal:     Comments: Mild puffiness to his right ankle particularly medially and some laterally, no bony tenderness of the ankle, no apparent calf or foot swelling  Skin:    General: Skin is warm and dry.  Neurological:     Mental Status: He is alert.      Assessment/Plan: Please see individual problem list.  Right ankle swelling This has  been an ongoing issue for about a month.  I have a pretty low suspicion for DVT given it is isolated to his ankle.  Patient is interested in ruling this out.  Will obtain a D-dimer.  If it is elevated we will get an ultrasound.  If it is negative it is very unlikely to be blood clot related.  Suspect related to arthritis or tendinitis.  He will keep his appointment with Dr. Lorelei Pont later this month.   Orders Placed This Encounter  Procedures  . D-Dimer, Quantitative    No orders of the defined types were placed in this encounter.   This visit occurred during the SARS-CoV-2 public health emergency.  Safety protocols were in place, including screening questions prior to the visit, additional usage of staff PPE, and extensive cleaning of exam room while observing appropriate contact time as indicated for disinfecting solutions.    Tommi Rumps, MD [place]

## 2019-12-17 NOTE — Patient Instructions (Signed)
Nice to see you. We will get a D-dimer to help rule out a blood clot though I do have a pretty low suspicion for that. Please keep your appointment with Dr. Lorelei Pont.

## 2019-12-18 LAB — D-DIMER, QUANTITATIVE: D-Dimer, Quant: 0.33 mcg/mL FEU (ref <0.50)

## 2019-12-19 DIAGNOSIS — M5136 Other intervertebral disc degeneration, lumbar region: Secondary | ICD-10-CM | POA: Diagnosis not present

## 2019-12-19 DIAGNOSIS — M9903 Segmental and somatic dysfunction of lumbar region: Secondary | ICD-10-CM | POA: Diagnosis not present

## 2019-12-19 DIAGNOSIS — M9902 Segmental and somatic dysfunction of thoracic region: Secondary | ICD-10-CM | POA: Diagnosis not present

## 2019-12-19 DIAGNOSIS — M546 Pain in thoracic spine: Secondary | ICD-10-CM | POA: Diagnosis not present

## 2019-12-26 DIAGNOSIS — F1020 Alcohol dependence, uncomplicated: Secondary | ICD-10-CM | POA: Diagnosis not present

## 2019-12-26 DIAGNOSIS — R413 Other amnesia: Secondary | ICD-10-CM | POA: Diagnosis not present

## 2019-12-26 DIAGNOSIS — G4733 Obstructive sleep apnea (adult) (pediatric): Secondary | ICD-10-CM | POA: Diagnosis not present

## 2019-12-26 DIAGNOSIS — Z9989 Dependence on other enabling machines and devices: Secondary | ICD-10-CM | POA: Diagnosis not present

## 2019-12-26 DIAGNOSIS — E519 Thiamine deficiency, unspecified: Secondary | ICD-10-CM | POA: Diagnosis not present

## 2019-12-26 DIAGNOSIS — E538 Deficiency of other specified B group vitamins: Secondary | ICD-10-CM | POA: Diagnosis not present

## 2019-12-30 ENCOUNTER — Ambulatory Visit (INDEPENDENT_AMBULATORY_CARE_PROVIDER_SITE_OTHER): Payer: PPO | Admitting: Family Medicine

## 2019-12-30 ENCOUNTER — Other Ambulatory Visit: Payer: Self-pay

## 2019-12-30 ENCOUNTER — Encounter: Payer: Self-pay | Admitting: Family Medicine

## 2019-12-30 VITALS — BP 142/84 | HR 78 | Temp 98.7°F | Ht 66.0 in | Wt 311.2 lb

## 2019-12-30 DIAGNOSIS — M7671 Peroneal tendinitis, right leg: Secondary | ICD-10-CM

## 2019-12-30 DIAGNOSIS — M76821 Posterior tibial tendinitis, right leg: Secondary | ICD-10-CM

## 2019-12-30 NOTE — Progress Notes (Signed)
T. , MD Primary Care and Sports Medicine [facility] at [facility] [place], [address] Phone: [phone]  FAX: [phone]  Paul Cook - 69 y.o. male  MRN [id number]  Date of Birth: 1950/11/30  Visit Date: 12/30/2019  PCP: McLean-Scocuzza, [name], MD  Referred by: Orland Mustard *  Chief Complaint  Patient presents with  . Follow-up    Right Ankle    This visit occurred during the SARS-CoV-2 public health emergency.  Safety protocols were in place, including screening questions prior to the visit, additional usage of staff PPE, and extensive cleaning of exam room while observing appropriate contact time as indicated for disinfecting solutions.   Subjective:   Paul Cook is a 69 y.o. very pleasant male patient with Body mass index is 50.24 kg/m. who presents with the following:  1 month f/u R ankle.  He is here in follow-up regarding his peroneal and posterior tibialis tendinopathy.  I had placed him in an ASO ankle brace, and had him titrate out of this.  He wore this for about 3 weeks and now is wearing it only when he is out and about.  I did give him 10 days of oral steroids as well.  He feels quite a bit better.  Peroneal and PT tendinopathy is doing a lot better.   Review of Systems is noted in the HPI, as appropriate   Objective:   BP (!) 142/84   Pulse 78   Temp 98.7 F (37.1 C) (Temporal)   Ht 5\' 6"  (1.676 m)   Wt (!) 311 lb 4 oz (141.2 kg)   SpO2 92%   BMI 50.24 kg/m   GEN: No acute distress; alert,appropriate. PULM: Breathing comfortably in no respiratory distress PSYCH: Normally interactive.   The entirety of the bilateral foot and ankle exam is nontender.  At this point his peroneal and posterior tib tendons are nontender.  All of his ligamentous structures are nontender.  All bony anatomy in the forefoot, midfoot, hindfoot and ankle are nontender.  Radiology: No results found.   Assessment and Plan:     ICD-10-CM   1. Tibialis tendinitis of right lower extremity  M76.821   2. Peroneal tendinitis of lower leg, right  M76.71    He is done well.  Titrate out of ASO brace, and when he is on his feet for extended periods of time, recommended use this for prophylactic.  Follow-up: No follow-ups on file.  No orders of the defined types were placed in this encounter.  Medications Discontinued During This Encounter  Medication Reason  . predniSONE (DELTASONE) 20 MG tablet Completed Course   No orders of the defined types were placed in this encounter.   Signed,  Maud Deed. , MD   Outpatient Encounter Medications as of 12/30/2019  Medication Sig  . albuterol (VENTOLIN HFA) 108 (90 Base) MCG/ACT inhaler Inhale 1-2 puffs into the lungs every 6 (six) hours as needed for wheezing or shortness of breath.  Marland Kitchen amLODipine (NORVASC) 5 MG tablet Take 1 tablet (5 mg total) by mouth daily.  . Cholecalciferol 50 MCG (2000 UT) CAPS Take 2,000 Units by mouth.  . diclofenac Sodium (VOLTAREN) 1 % GEL Apply 2-4 g topically 4 (four) times daily.  Marland Kitchen dicyclomine (BENTYL) 10 MG capsule Take 1 capsule (10 mg total) by mouth 2 (two) times daily.  Marland Kitchen ezetimibe (ZETIA) 10 MG tablet Take 1 tablet (10 mg total) by mouth daily.  Marland Kitchen  famotidine (PEPCID) 20 MG tablet Take 1 tablet (20 mg total) by mouth daily.  Marland Kitchen gabapentin (NEURONTIN) 600 MG tablet TAKE 1 TABLET(600 MG) BY MOUTH THREE TIMES DAILY  . lisinopril-hydrochlorothiazide (ZESTORETIC) 20-12.5 MG tablet Take 2 tablets by mouth daily.  Marland Kitchen LORazepam (ATIVAN) 0.5 MG tablet Take 1-2 tablets (0.5-1 mg total) by mouth at bedtime as needed for anxiety.  Marland Kitchen nystatin cream (MYCOSTATIN) Apply 1 application topically 2 (two) times daily.  Marland Kitchen rOPINIRole (REQUIP) 2 MG tablet Take 3 tablets (6 mg total) by mouth at bedtime.  . sertraline (ZOLOFT) 50 MG tablet Take 1 tablet (50 mg total) by mouth daily.  . sildenafil (REVATIO) 20 MG tablet 1-5  tablets before intercourse 30 minutes to 4 hrs  . trazodone (DESYREL) 300 MG tablet Take 1 tablet (300 mg total) by mouth at bedtime.  . [DISCONTINUED] predniSONE (DELTASONE) 20 MG tablet 2 tabs po daily for 5 days, then 1 tab po daily for 5 days   No facility-administered encounter medications on file as of 12/30/2019.

## 2020-01-16 ENCOUNTER — Other Ambulatory Visit: Payer: PPO

## 2020-01-20 ENCOUNTER — Other Ambulatory Visit: Payer: Self-pay

## 2020-01-20 ENCOUNTER — Telehealth: Payer: Self-pay | Admitting: *Deleted

## 2020-01-20 ENCOUNTER — Other Ambulatory Visit (INDEPENDENT_AMBULATORY_CARE_PROVIDER_SITE_OTHER): Payer: PPO

## 2020-01-20 DIAGNOSIS — I10 Essential (primary) hypertension: Secondary | ICD-10-CM | POA: Diagnosis not present

## 2020-01-20 DIAGNOSIS — Z1329 Encounter for screening for other suspected endocrine disorder: Secondary | ICD-10-CM

## 2020-01-20 DIAGNOSIS — R7303 Prediabetes: Secondary | ICD-10-CM

## 2020-01-20 LAB — LIPID PANEL
Cholesterol: 180 mg/dL (ref 0–200)
HDL: 57.3 mg/dL (ref >39.00)
LDL Cholesterol: 100 mg/dL — ABNORMAL HIGH (ref 0–99)
NonHDL: 122.74
Total CHOL/HDL Ratio: 3
Triglycerides: 114 mg/dL (ref 0.0–149.0)
VLDL: 22.8 mg/dL (ref 0.0–40.0)

## 2020-01-20 LAB — HEMOGLOBIN A1C: Hgb A1c MFr Bld: 5.7 % (ref 4.6–6.5)

## 2020-01-20 NOTE — Telephone Encounter (Signed)
Pt should have informed us before today  If not too late can cancel cmet, cbc, tsh had 12/26/19 with neurology   Keep lipid and A1C please   [place]

## 2020-01-20 NOTE — Telephone Encounter (Signed)
Orders have been updated and cancelled.

## 2020-01-20 NOTE — Telephone Encounter (Signed)
Pt came in for labs this morning & wanted to let you know that he labs at a specialist office recently also & wanted to make sure his insurance will cover them both. HE thinks he saw neurology at [facility]. I gave him our fax number & asked him to call & have them fax over the labs. But I drew all labs ordered from our office today too.

## 2020-01-20 NOTE — Addendum Note (Signed)
Addended by: Tor Netters I on: 01/20/2020 12:57 PM   Modules accepted: Orders

## 2020-01-21 ENCOUNTER — Ambulatory Visit (INDEPENDENT_AMBULATORY_CARE_PROVIDER_SITE_OTHER): Payer: PPO | Admitting: Internal Medicine

## 2020-01-21 ENCOUNTER — Encounter: Payer: Self-pay | Admitting: Internal Medicine

## 2020-01-21 ENCOUNTER — Other Ambulatory Visit: Payer: Self-pay

## 2020-01-21 VITALS — BP 140/86 | HR 88 | Temp 98.6°F | Ht 66.0 in | Wt 312.6 lb

## 2020-01-21 DIAGNOSIS — G8929 Other chronic pain: Secondary | ICD-10-CM

## 2020-01-21 DIAGNOSIS — M25571 Pain in right ankle and joints of right foot: Secondary | ICD-10-CM

## 2020-01-21 DIAGNOSIS — I10 Essential (primary) hypertension: Secondary | ICD-10-CM | POA: Diagnosis not present

## 2020-01-21 DIAGNOSIS — Z6841 Body Mass Index (BMI) 40.0 and over, adult: Secondary | ICD-10-CM | POA: Diagnosis not present

## 2020-01-21 DIAGNOSIS — F1021 Alcohol dependence, in remission: Secondary | ICD-10-CM | POA: Diagnosis not present

## 2020-01-21 NOTE — Patient Instructions (Addendum)
[facility]   Lisinopril hctz 2 pill in the am  norvasc 5 mg at night   Goal blood pressure <130/<80   Premier protein shake chocholate blend with banana  Cut back on breads/pastas/sweets/sweet drinks  califlower rice    Consider dermatology referral   Seborrheic Keratosis A seborrheic keratosis is a common, noncancerous (benign) skin growth. These growths are velvety, waxy, rough, tan, brown, or black spots that appear on the skin. These skin growths can be flat or raised, and scaly. What are the causes? The cause of this condition is not known. What increases the risk? You are more likely to develop this condition if you:  Have a family history of seborrheic keratosis.  Are 50 or older.  Are pregnant.  Have had estrogen replacement therapy. What are the signs or symptoms? Symptoms of this condition include growths on the face, chest, shoulders, back, or other areas. These growths:  Are usually painless, but may become irritated and itchy.  Can be yellow, brown, black, or other colors.  Are slightly raised or have a flat surface.  Are sometimes rough or wart-like in texture.  Are often velvety or waxy on the surface.  Are round or oval-shaped.  Often occur in groups, but may occur as a single growth. How is this diagnosed? This condition is diagnosed with a medical history and physical exam.  A sample of the growth may be tested (skin biopsy).  You may need to see a skin specialist (dermatologist). How is this treated? Treatment is not usually needed for this condition, unless the growths are irritated or bleed often.  You may also choose to have the growths removed if you do not like their appearance. ? Most commonly, these growths are treated with a procedure in which liquid nitrogen is applied to "freeze" off the growth (cryosurgery). ? They may also be burned off with electricity (electrocautery) or removed by scraping (curettage). Follow these  instructions at home:  Watch your growth for any changes.  Keep all follow-up visits as told by your health care provider. This is important.  Do not scratch or pick at the growth or growths. This can cause them to become irritated or infected. Contact a health care provider if:  You suddenly have many new growths.  Your growth bleeds, itches, or hurts.  Your growth suddenly becomes larger or changes color. Summary  A seborrheic keratosis is a common, noncancerous (benign) skin growth.  Treatment is not usually needed for this condition, unless the growths are irritated or bleed often.  Watch your growth for any changes.  Contact a health care provider if you suddenly have many new growths or your growth suddenly becomes larger or changes color.  Keep all follow-up visits as told by your health care provider. This is important. This information is not intended to replace advice given to you by your health care provider. Make sure you discuss any questions you have with your health care provider. Document Revised: 02/15/2018 Document Reviewed: 02/15/2018 Elsevier Patient Education  2020 [place] is a feeling of pain, discomfort, burning, or fullness in the upper part of your abdomen. It can come and go. It may occur frequently or rarely. Indigestion tends to occur while you are eating or right after you have finished eating. It may be worse at night and while bending over or lying down. Indigestion may be a symptom of an underlying digestive condition. Follow these instructions at home: Eating and drinking  Follow an eating plan as recommended by your health care provider.  Avoid certain foods and drinks as told by your health care provider. This may include: ? Chocolate and cocoa. ? Peppermint and mint flavorings. ? Garlic and onions. ? Horseradish. ? Spicy and acidic foods, including peppers, chili powder, curry powder, vinegar, hot sauces,  and barbecue sauce. ? Citrus fruits, such as oranges, lemons, and limes. ? Tomato-based foods, such as red sauce, chili, salsa, and pizza with red sauce. ? Fried and fatty foods, such as donuts, french fries, potato chips, and high-fat dressings. ? High-fat meats, such as hot dogs and fatty cuts of red and white meats, such as rib eye steak, sausage, ham, and bacon. ? High-fat dairy items, such as whole milk, butter, and cream cheese. ? Coffee and tea (with or without caffeine). ? Drinks that contain alcohol. ? Energy drinks and sports drinks. ? Carbonated drinks or sodas. ? Citrus fruit juices.  Eat small, frequent meals instead of large meals.  Avoid drinking large amounts of liquid with your meals.  Avoid eating meals during the 2-3 hours before bedtime.  Avoid lying down right after you eat.  Avoid exercise for 2 hours after you eat. Lifestyle      Maintain a healthy weight. Ask your health care provider what weight is healthy for you. If you need to lose weight, work with your health care provider to do so safely.  Exercise for at least 30 minutes on 5 or more days each week, or as told by your health care provider. Avoid exercises that include bending forward. This can make your symptoms worse.  Wear loose-fitting clothing. Do not wear anything tight around your waist that causes pressure on your abdomen.  Do not use any products that contain nicotine or tobacco, such as cigarettes, e-cigarettes, and chewing tobacco. These can make symptoms worse. If you need help quitting, ask your health care provider.  Raise (elevate) the head of your bed about 6 inches (15 cm) when you sleep.  Try to reduce your stress, such as with yoga or meditation. If you need help reducing stress, ask your health care provider. General instructions  Take over-the-counter and prescription medicines only as told by your health care provider. Do not take aspirin, ibuprofen, or other NSAIDs unless  your health care provider told you to do so.  Pay attention to any changes in your symptoms.  Keep all follow-up visits as told by your health care provider. This is important. Contact a health care provider if:  You have new symptoms.  You have unexplained weight loss.  You have difficulty swallowing, or it hurts to swallow.  Your symptoms do not improve with treatment.  Your symptoms last for more than 2 days.  You have a fever.  You vomit. Get help right away if:  You have pain in your arms, neck, jaw, teeth, or back.  You feel sweaty, dizzy, or light-headed.  You faint.  You have chest pain or shortness of breath.  You cannot stop vomiting, or you vomit blood.  Your stool is bloody or black.  You have severe pain in your abdomen. These symptoms may represent a serious problem that is an emergency. Do not wait to see if the symptoms will go away. Get medical help right away. Call your local emergency services (911 in the [place] Do not drive yourself to the hospital. Summary  Indigestion is a feeling of pain, discomfort, burning, or fullness in the upper part of  your abdomen. It tends to occur while you are eating or right after you have finished eating.  Follow an eating plan and other lifestyle changes as told by your health care provider.  Take over-the-counter and prescription medicines only as told by your health care provider. Do not take aspirin, ibuprofen, or other NSAIDs unless your health care provider told you to do so.  Contact your health care provider if your symptoms do not get better or they get worse. Some symptoms may represent a serious problem that is an emergency. Do not wait to see if the symptoms will go away. Get medical help right away. This information is not intended to replace advice given to you by your health care provider. Make sure you discuss any questions you have with your health care provider. Document Revised: 03/05/2018 Document  Reviewed: 03/05/2018 Elsevier Patient Education  [place] DASH stands for "Dietary Approaches to Stop Hypertension." The DASH eating plan is a healthy eating plan that has been shown to reduce high blood pressure (hypertension). It may also reduce your risk for type 2 diabetes, heart disease, and stroke. The DASH eating plan may also help with weight loss. What are tips for following this plan?  General guidelines  Avoid eating more than 2,300 mg (milligrams) of salt (sodium) a day. If you have hypertension, you may need to reduce your sodium intake to 1,500 mg a day.  Limit alcohol intake to no more than 1 drink a day for nonpregnant women and 2 drinks a day for men. One drink equals 12 oz of beer, 5 oz of wine, or 1 oz of hard liquor.  Work with your health care provider to maintain a healthy body weight or to lose weight. Ask what an ideal weight is for you.  Get at least 30 minutes of exercise that causes your heart to beat faster (aerobic exercise) most days of the week. Activities may include walking, swimming, or biking.  Work with your health care provider or diet and nutrition specialist (dietitian) to adjust your eating plan to your individual calorie needs. Reading food labels   Check food labels for the amount of sodium per serving. Choose foods with less than 5 percent of the Daily Value of sodium. Generally, foods with less than 300 mg of sodium per serving fit into this eating plan.  To find whole grains, look for the word "whole" as the first word in the ingredient list. Shopping  Buy products labeled as "low-sodium" or "no salt added."  Buy fresh foods. Avoid canned foods and premade or frozen meals. Cooking  Avoid adding salt when cooking. Use salt-free seasonings or herbs instead of table salt or sea salt. Check with your health care provider or pharmacist before using salt substitutes.  Do not fry foods. Cook foods using healthy  methods such as baking, boiling, grilling, and broiling instead.  Cook with heart-healthy oils, such as olive, canola, soybean, or sunflower oil. Meal planning  Eat a balanced diet that includes: ? 5 or more servings of fruits and vegetables each day. At each meal, try to fill half of your plate with fruits and vegetables. ? Up to 6-8 servings of whole grains each day. ? Less than 6 oz of lean meat, poultry, or fish each day. A 3-oz serving of meat is about the same size as a deck of cards. One egg equals 1 oz. ? 2 servings of low-fat dairy each day. ? A serving  of nuts, seeds, or beans 5 times each week. ? Heart-healthy fats. Healthy fats called Omega-3 fatty acids are found in foods such as flaxseeds and coldwater fish, like sardines, salmon, and mackerel.  Limit how much you eat of the following: ? Canned or prepackaged foods. ? Food that is high in trans fat, such as fried foods. ? Food that is high in saturated fat, such as fatty meat. ? Sweets, desserts, sugary drinks, and other foods with added sugar. ? Full-fat dairy products.  Do not salt foods before eating.  Try to eat at least 2 vegetarian meals each week.  Eat more home-cooked food and less restaurant, buffet, and fast food.  When eating at a restaurant, ask that your food be prepared with less salt or no salt, if possible. What foods are recommended? The items listed may not be a complete list. Talk with your dietitian about what dietary choices are best for you. Grains Whole-grain or whole-wheat bread. Whole-grain or whole-wheat pasta. Brown rice. [place]. Bulgur. Whole-grain and low-sodium cereals. Pita bread. Low-fat, low-sodium crackers. Whole-wheat flour tortillas. Vegetables Fresh or frozen vegetables (raw, steamed, roasted, or grilled). Low-sodium or reduced-sodium tomato and vegetable juice. Low-sodium or reduced-sodium tomato sauce and tomato paste. Low-sodium or reduced-sodium canned  vegetables. Fruits All fresh, dried, or frozen fruit. Canned fruit in natural juice (without added sugar). Meat and other protein foods Skinless chicken or [place]. Ground chicken or [place]. Pork with fat trimmed off. Fish and seafood. Egg whites. Dried beans, peas, or lentils. Unsalted nuts, nut butters, and seeds. Unsalted canned beans. Lean cuts of beef with fat trimmed off. Low-sodium, lean deli meat. Dairy Low-fat (1%) or fat-free (skim) milk. Fat-free, low-fat, or reduced-fat cheeses. Nonfat, low-sodium ricotta or cottage cheese. Low-fat or nonfat yogurt. Low-fat, low-sodium cheese. Fats and oils Soft margarine without trans fats. Vegetable oil. Low-fat, reduced-fat, or light mayonnaise and salad dressings (reduced-sodium). Canola, safflower, olive, soybean, and sunflower oils. Avocado. Seasoning and other foods Herbs. Spices. Seasoning mixes without salt. Unsalted popcorn and pretzels. Fat-free sweets. What foods are not recommended? The items listed may not be a complete list. Talk with your dietitian about what dietary choices are best for you. Grains Baked goods made with fat, such as croissants, muffins, or some breads. Dry pasta or rice meal packs. Vegetables Creamed or fried vegetables. Vegetables in a cheese sauce. Regular canned vegetables (not low-sodium or reduced-sodium). Regular canned tomato sauce and paste (not low-sodium or reduced-sodium). Regular tomato and vegetable juice (not low-sodium or reduced-sodium). Angie Fava. Olives. Fruits Canned fruit in a light or heavy syrup. Fried fruit. Fruit in cream or butter sauce. Meat and other protein foods Fatty cuts of meat. Ribs. Fried meat. [place] Salines. Sausage. Bologna and other processed lunch meats. Salami. Fatback. Hotdogs. Bratwurst. Salted nuts and seeds. Canned beans with added salt. Canned or smoked fish. Whole eggs or egg yolks. Chicken or [place] with skin. Dairy Whole or 2% milk, cream, and half-and-half. Whole or full-fat  cream cheese. Whole-fat or sweetened yogurt. Full-fat cheese. Nondairy creamers. Whipped toppings. Processed cheese and cheese spreads. Fats and oils Butter. Stick margarine. Lard. Shortening. Ghee. Bacon fat. Tropical oils, such as coconut, palm kernel, or palm oil. Seasoning and other foods Salted popcorn and pretzels. Onion salt, garlic salt, seasoned salt, table salt, and sea salt. Worcestershire sauce. Tartar sauce. Barbecue sauce. Teriyaki sauce. Soy sauce, including reduced-sodium. Steak sauce. Canned and packaged gravies. Fish sauce. Oyster sauce. Cocktail sauce. Horseradish that you find on the shelf. Ketchup. Mustard. Meat  flavorings and tenderizers. Bouillon cubes. Hot sauce and Tabasco sauce. Premade or packaged marinades. Premade or packaged taco seasonings. Relishes. Regular salad dressings. Where to find more information:  National Heart, Lung, and [place]: https://wilson-eaton.com/  American Heart Association: www.heart.org Summary  The DASH eating plan is a healthy eating plan that has been shown to reduce high blood pressure (hypertension). It may also reduce your risk for type 2 diabetes, heart disease, and stroke.  With the DASH eating plan, you should limit salt (sodium) intake to 2,300 mg a day. If you have hypertension, you may need to reduce your sodium intake to 1,500 mg a day.  When on the DASH eating plan, aim to eat more fresh fruits and vegetables, whole grains, lean proteins, low-fat dairy, and heart-healthy fats.  Work with your health care provider or diet and nutrition specialist (dietitian) to adjust your eating plan to your individual calorie needs. This information is not intended to replace advice given to you by your health care provider. Make sure you discuss any questions you have with your health care provider. Document Revised: 09/15/2017 Document Reviewed: 09/26/2016 Elsevier Patient Education  2020 [employer].

## 2020-01-21 NOTE — Progress Notes (Signed)
Chief Complaint  Patient presents with  . Follow-up  . Foot Swelling    right foot and ankle swelling for the past 2 weeks on and off   F/u  1. HTN BP elevated no meds takes meds in the pm on norvasc 5 mg qd lis 20-12.5 2 pills daily  2. C/o right inner ankle pain x 20 years and swelling pain 3/10 today steroids orally by Dr. Donella Stade helps  3. H/o alcoholism with memory loss he is still drinking 2 beers daily saw Dr. Manuella Ghazi who rec MRI brain but he is claustrophobic he is agreeable to do CT  4. Morbid obesity and prediabetes A1C 5.7 sl improved wants to know how to lose weight rec exercise, reduce calories and healthy diet choices   Review of Systems  Constitutional: Negative for weight loss.  HENT: Negative for hearing loss.   Eyes: Negative for blurred vision.  Respiratory: Negative for shortness of breath.   Cardiovascular: Negative for chest pain.  Gastrointestinal: Negative for abdominal pain.  Musculoskeletal: Positive for joint pain.  Skin: Negative for rash.  Psychiatric/Behavioral: Positive for memory loss.   Past Medical History:  Diagnosis Date  . Adenomatous colon polyp    tubular  . Adenomatous rectal polyp    tubular  . Asthma   . Depression   . Diverticulosis   . GERD (gastroesophageal reflux disease)   . History of chicken pox   . Hyperlipidemia   . Hypertension   . Lung nodule    CT 10/2014  . OSA (obstructive sleep apnea)   . Sleep apnea    wears cpap  . Vitamin D deficiency    Past Surgical History:  Procedure Laterality Date  . COLONOSCOPY    . COLONOSCOPY W/ POLYPECTOMY  2015   [facility], benign  . HERNIA REPAIR     inguinal  . POLYPECTOMY    . TONSILLECTOMY     age 73    Family History  Problem Relation Age of Onset  . Depression Mother   . Hypertension Father   . Heart disease Father   . Stroke Father   . Cancer Maternal Grandmother        ovarian?  . Cancer Paternal Grandfather        prostate  . Prostate cancer Paternal  Grandfather   . Colon cancer Neg Hx   . Colon polyps Neg Hx    Social History   Socioeconomic History  . Marital status: Married    Spouse name: Not on file  . Number of children: Not on file  . Years of education: Not on file  . Highest education level: Not on file  Occupational History  . Not on file  Tobacco Use  . Smoking status: Never Smoker  . Smokeless tobacco: Never Used  Substance and Sexual Activity  . Alcohol use: No    Alcohol/week: 0.0 standard drinks    Comment: stopped drinking 20 years ago  . Drug use: No  . Sexual activity: Never  Other Topics Concern  . Not on file  Social History Narrative   Lives in [place] with wife, [name] 23 years as of 10/2018. 1 daughter in [place].      Work - Optician, dispensing, now Animal nutritionist      From [place] Strain:   . Difficulty of Paying Living Expenses:   Food Insecurity:   . Worried About Running  Out of Food in the Last Year:   . [place] in the Last Year:   Transportation Needs:   . Lack of Transportation (Medical):   Marland Kitchen Lack of Transportation (Non-Medical):   Physical Activity:   . Days of Exercise per Week:   . Minutes of Exercise per Session:   Stress:   . Feeling of Stress :   Social Connections:   . Frequency of Communication with Friends and Family:   . Frequency of Social Gatherings with Friends and Family:   . Attends Religious Services:   . Active Member of Clubs or Organizations:   . Attends Archivist Meetings:   Marland Kitchen Marital Status:   Intimate Partner Violence:   . Fear of Current or Ex-Partner:   . Emotionally Abused:   Marland Kitchen Physically Abused:   . Sexually Abused:    Current Meds  Medication Sig  . albuterol (VENTOLIN HFA) 108 (90 Base) MCG/ACT inhaler Inhale 1-2 puffs into the lungs every 6 (six) hours as needed for wheezing or shortness of breath.  Marland Kitchen amLODipine (NORVASC) 5 MG tablet Take 1 tablet (5 mg total)  by mouth daily.  . diclofenac Sodium (VOLTAREN) 1 % GEL Apply 2-4 g topically 4 (four) times daily.  Marland Kitchen dicyclomine (BENTYL) 10 MG capsule Take 1 capsule (10 mg total) by mouth 2 (two) times daily.  Marland Kitchen ezetimibe (ZETIA) 10 MG tablet Take 1 tablet (10 mg total) by mouth daily.  . famotidine (PEPCID) 20 MG tablet Take 1 tablet (20 mg total) by mouth daily.  Marland Kitchen gabapentin (NEURONTIN) 600 MG tablet TAKE 1 TABLET(600 MG) BY MOUTH THREE TIMES DAILY  . lisinopril-hydrochlorothiazide (ZESTORETIC) 20-12.5 MG tablet Take 2 tablets by mouth daily.  Marland Kitchen LORazepam (ATIVAN) 0.5 MG tablet Take 1-2 tablets (0.5-1 mg total) by mouth at bedtime as needed for anxiety.  Marland Kitchen nystatin cream (MYCOSTATIN) Apply 1 application topically 2 (two) times daily.  Marland Kitchen rOPINIRole (REQUIP) 2 MG tablet Take 3 tablets (6 mg total) by mouth at bedtime.  . sertraline (ZOLOFT) 50 MG tablet Take 1 tablet (50 mg total) by mouth daily.  . sildenafil (REVATIO) 20 MG tablet 1-5 tablets before intercourse 30 minutes to 4 hrs  . trazodone (DESYREL) 300 MG tablet Take 1 tablet (300 mg total) by mouth at bedtime.   Allergies  Allergen Reactions  . Nsaids Other (See Comments)    GI Issues   Recent Results (from the past 2160 hour(s))  D-Dimer, Quantitative     Status: None   Collection Time: 12/17/19 12:04 PM  Result Value Ref Range   D-Dimer, Quant 0.33 <0.50 mcg/mL FEU    Comment: . The D-Dimer test is used frequently to exclude an acute PE or DVT. In patients with a low to moderate clinical risk assessment and a D-Dimer result <0.50 mcg/mL FEU, the likelihood of a PE or DVT is very low. However, a thromboembolic event should not be excluded solely on the basis of the D-Dimer level. Increased levels of D-Dimer are associated with a PE, DVT, DIC, malignancies, inflammation, sepsis, surgery, trauma, pregnancy, and advancing patient age. [Jama 2006 11:295(2):199-207] . For additional information, please refer  to: http://education.questdiagnostics.com/faq/FAQ149 (This link is being provided for informational/ educational purposes only) .   Hemoglobin A1c     Status: None   Collection Time: 01/20/20  8:40 AM  Result Value Ref Range   Hgb A1c MFr Bld 5.7 4.6 - 6.5 %    Comment: Glycemic Control Guidelines for People with  Diabetes:Non Diabetic:  <6%Goal of Therapy: <7%Additional Action Suggested:  >8%   Lipid panel     Status: Abnormal   Collection Time: 01/20/20  8:40 AM  Result Value Ref Range   Cholesterol 180 0 - 200 mg/dL    Comment: ATP III Classification       Desirable:  < 200 mg/dL               Borderline High:  200 - 239 mg/dL          High:  > = 240 mg/dL   Triglycerides 114.0 0.0 - 149.0 mg/dL    Comment: Normal:  <150 mg/dLBorderline High:  150 - 199 mg/dL   HDL 57.30 >39.00 mg/dL   VLDL 22.8 0.0 - 40.0 mg/dL   LDL Cholesterol 100 (H) 0 - 99 mg/dL   Total CHOL/HDL Ratio 3     Comment:                Men          Women1/2 Average Risk     3.4          3.3Average Risk          5.0          4.42X Average Risk          9.6          7.13X Average Risk          15.0          11.0                       NonHDL 122.74     Comment: NOTE:  Non-HDL goal should be 30 mg/dL higher than patient's LDL goal (i.e. LDL goal of < 70 mg/dL, would have non-HDL goal of < 100 mg/dL)   Objective  Body mass index is 50.45 kg/m. Wt Readings from Last 3 Encounters:  01/21/20 (!) 312 lb 9.6 oz (141.8 kg)  12/30/19 (!) 311 lb 4 oz (141.2 kg)  12/17/19 (!) 318 lb (144.2 kg)   Temp Readings from Last 3 Encounters:  01/21/20 98.6 F (37 C) (Temporal)  12/30/19 98.7 F (37.1 C) (Temporal)  12/17/19 (!) 97.3 F (36.3 C) (Temporal)   BP Readings from Last 3 Encounters:  01/21/20 140/86  12/30/19 (!) 142/84  12/17/19 140/80   Pulse Readings from Last 3 Encounters:  01/21/20 88  12/30/19 78  12/17/19 92    Physical Exam Vitals and nursing note reviewed.  Constitutional:      Appearance: Normal  appearance. He is well-developed and well-groomed. He is morbidly obese.  HENT:     Head: Normocephalic and atraumatic.  Eyes:     Conjunctiva/sclera: Conjunctivae normal.     Pupils: Pupils are equal, round, and reactive to light.  Cardiovascular:     Rate and Rhythm: Normal rate and regular rhythm.     Heart sounds: Normal heart sounds. No murmur.  Pulmonary:     Effort: Pulmonary effort is normal.     Breath sounds: Normal breath sounds.  Skin:    General: Skin is warm and dry.  Neurological:     General: No focal deficit present.     Mental Status: He is alert and oriented to person, place, and time. Mental status is at baseline.     Gait: Gait normal.  Psychiatric:        Attention and Perception: Attention and perception normal.  Mood and Affect: Mood and affect normal.        Speech: Speech normal.        Behavior: Behavior normal. Behavior is cooperative.        Thought Content: Thought content normal.        Cognition and Memory: Cognition and memory normal.        Judgment: Judgment normal.     Assessment  Plan  Chronic pain of right ankle - Plan: Ambulatory referral to Podiatry voltaren gel prn   Essential hypertension Cont meds  Take lis hctz 40-25 in am and norvasc 5 mg qhs   History of alcoholism ([place]) rec cessation currently drinking 2 beers   Morbid obesity with BMI of 50.0-59.9, adult (HCC)  Disc wt loss surgery consider in future   HM Flu shot utd tdap utd, prevnar, pna 23  Consider shingrix in future covid 19 vx had 2/2 per pt unknown date   Never smoker Nl PSA1.04 07/09/19 Colonoscopy h/o multiple polyps due 04/12/2019 [facility] -pt wants to holdas of 10/17/2019 and 01/21/20  Consider derm in future notneeded now  MMR immune and hep C neg rec healthy diet and exercise Consider dermatology referral for isks trunk and Sks and skin cancer screening in future pt will let me know if wants in future   Provider: Dr. [name]  [facility]

## 2020-01-23 ENCOUNTER — Ambulatory Visit (INDEPENDENT_AMBULATORY_CARE_PROVIDER_SITE_OTHER): Payer: PPO | Admitting: Family Medicine

## 2020-01-23 ENCOUNTER — Other Ambulatory Visit: Payer: Self-pay

## 2020-01-23 ENCOUNTER — Encounter: Payer: Self-pay | Admitting: Family Medicine

## 2020-01-23 VITALS — BP 140/76 | HR 68 | Temp 99.0°F | Ht 66.0 in | Wt 315.8 lb

## 2020-01-23 DIAGNOSIS — R0600 Dyspnea, unspecified: Secondary | ICD-10-CM

## 2020-01-23 DIAGNOSIS — I878 Other specified disorders of veins: Secondary | ICD-10-CM

## 2020-01-23 DIAGNOSIS — R600 Localized edema: Secondary | ICD-10-CM | POA: Diagnosis not present

## 2020-01-23 DIAGNOSIS — R0609 Other forms of dyspnea: Secondary | ICD-10-CM

## 2020-01-23 LAB — BASIC METABOLIC PANEL
BUN: 13 mg/dL (ref 6–23)
CO2: 30 mEq/L (ref 19–32)
Calcium: 8.6 mg/dL (ref 8.4–10.5)
Chloride: 103 mEq/L (ref 96–112)
Creatinine, Ser: 0.92 mg/dL (ref 0.40–1.50)
GFR: 81.72 mL/min (ref >60.00)
Glucose, Bld: 105 mg/dL — ABNORMAL HIGH (ref 70–99)
Potassium: 3.9 mEq/L (ref 3.5–5.1)
Sodium: 139 mEq/L (ref 135–145)

## 2020-01-23 LAB — HEPATIC FUNCTION PANEL
ALT: 84 U/L — ABNORMAL HIGH (ref 0–53)
AST: 59 U/L — ABNORMAL HIGH (ref 0–37)
Albumin: 4.2 g/dL (ref 3.5–5.2)
Alkaline Phosphatase: 81 U/L (ref 39–117)
Bilirubin, Direct: 0.1 mg/dL (ref 0.0–0.3)
Total Bilirubin: 0.6 mg/dL (ref 0.2–1.2)
Total Protein: 6.6 g/dL (ref 6.0–8.3)

## 2020-01-23 LAB — BRAIN NATRIURETIC PEPTIDE: Pro B Natriuretic peptide (BNP): 18 pg/mL (ref 0.0–100.0)

## 2020-01-23 NOTE — Progress Notes (Signed)
T. , MD, Paul Cook  Primary Care and Sports Medicine [facility] [place], [address]  Phone: [phone]  FAX: [phone]  Ell Bamberger - 69 y.o. male  MRN [id number]  Date of Birth: 1950-12-26  Date: 01/23/2020  PCP: [facility], [name], MD  Referral: [facility], [name] *  Chief Complaint  Patient presents with  . Follow-up    Right Ankle    This visit occurred during the SARS-CoV-2 public health emergency.  Safety protocols were in place, including screening questions prior to the visit, additional usage of staff PPE, and extensive cleaning of exam room while observing appropriate contact time as indicated for disinfecting solutions.   Subjective:   Paul Cook is a 69 y.o. very pleasant male patient with Body mass index is 50.96 kg/m. who presents with the following:  Here today for a recheck: The issue is really his concern about foot swelling.   I saw him in February and then later again in March for some posterior tibialis and peroneal tendinopathy, and this is essentially better now.  He is not having any pain with pushoff.  He feels better in this regard and he has for a very long time.  He presents today primarily with a question of some swelling in his lower extremity and ankles.  He appears that he is seen one of the doctors in his primary care office recently for this.  It looks as if he saw his primary care doctor 2 days ago, and he also saw one of her partners 1 month prior to this.  He has some questions about his foot being swollen.  On the R hand side, his pain with pushoff and the pain medially and laterally have resolved.  His BMI is 51.  He tells me that his feet have been swollen in the past and when he elevates them this helps.  12/30/2019 Last OV with Owens Loffler, MD  1 month f/u R ankle.  He is here in follow-up regarding his peroneal and posterior tibialis  tendinopathy.  I had placed him in an ASO ankle brace, and had him titrate out of this.  He wore this for about 3 weeks and now is wearing it only when he is out and about.  I did give him 10 days of oral steroids as well.  He feels quite a bit better.   Peroneal and PT tendinopathy is doing a lot better.   Review of Systems is noted in the HPI, as appropriate   Objective:   BP 140/76   Pulse 68   Temp 99 F (37.2 C) (Temporal)   Ht 5\' 6"  (1.676 m)   Wt (!) 315 lb 12 oz (143.2 kg)   SpO2 93%   BMI 50.96 kg/m   GEN: No acute distress; alert,appropriate. PULM: Breathing comfortably in no respiratory distress PSYCH: Normally interactive.   On exam, he does have 2+ pitting edema on the right and 1+ pitting edema on the left. The entirety of the ankle, midfoot, forefoot, and hindfoot are grossly nontender to palpate.  All bony anatomy is nontender to palpate.  His talus is nontender, distal fibula, distal tibia, fifth metatarsal, cuboid, navicular, and all cuneiforms are nontender to examination.  The peroneal and posterior tibialis tendons are grossly unremarkable and not tender now.  His strength has returned and is at full function.  He does have some what appears to be chronic venous stasis changes  in his lower extremities.  Radiology:   Assessment and Plan:     ICD-10-CM   1. Bilateral lower extremity edema  [id number] Basic metabolic panel    Hepatic function panel    Brain natriuretic peptide  2. Dyspnea on exertion  [id number] Basic metabolic panel    Hepatic function panel    Brain natriuretic peptide  3. Venous stasis of both lower extremities  [id number] Basic metabolic panel    Hepatic function panel    Brain natriuretic peptide   His peroneal and posterior tibialis tendinopathy are effectively resolved.  There is minimal arthropathy on his ankle plain film.  Minimal arthropathy in the medial aspect of the talus.  At BMI 51, he is likely to have issues off and on with his  entire lower legs, hips, and back.  These current issues with swelling are not sports medicine or orthopedic related.  Bilateral lower extremity that per his report is worsened compared to baseline.  He also tells me he is having some dyspnea that worsened compared to his baseline.  It does not appear he has had a cardiac BNP or an echocardiogram.  I think that it is probably reasonable to check a cardiac BNP, BMP, and hepatic function in the setting.  If these are abnormal or normal, I will defer the management entirely to his primary care team.  My suspicion is this is all venous stasis disease.  Classical appearance.  Follow-up: As needed only with me  No orders of the defined types were placed in this encounter.  There are no discontinued medications. Orders Placed This Encounter  Procedures  . Basic metabolic panel  . Hepatic function panel  . Brain natriuretic peptide    Signed,   T. , MD   Outpatient Encounter Medications as of 01/23/2020  Medication Sig  . albuterol (VENTOLIN HFA) 108 (90 Base) MCG/ACT inhaler Inhale 1-2 puffs into the lungs every 6 (six) hours as needed for wheezing or shortness of breath.  Marland Kitchen amLODipine (NORVASC) 5 MG tablet Take 1 tablet (5 mg total) by mouth daily.  . Cholecalciferol 50 MCG (2000 UT) CAPS Take 2,000 Units by mouth.  . diclofenac Sodium (VOLTAREN) 1 % GEL Apply 2-4 g topically 4 (four) times daily.  Marland Kitchen dicyclomine (BENTYL) 10 MG capsule Take 1 capsule (10 mg total) by mouth 2 (two) times daily.  Marland Kitchen ezetimibe (ZETIA) 10 MG tablet Take 1 tablet (10 mg total) by mouth daily.  . famotidine (PEPCID) 20 MG tablet Take 1 tablet (20 mg total) by mouth daily.  Marland Kitchen gabapentin (NEURONTIN) 600 MG tablet TAKE 1 TABLET(600 MG) BY MOUTH THREE TIMES DAILY  . lisinopril-hydrochlorothiazide (ZESTORETIC) 20-12.5 MG tablet Take 2 tablets by mouth daily.  Marland Kitchen LORazepam (ATIVAN) 0.5 MG tablet Take 1-2 tablets (0.5-1 mg total) by mouth at bedtime as  needed for anxiety.  Marland Kitchen nystatin cream (MYCOSTATIN) Apply 1 application topically 2 (two) times daily.  Marland Kitchen rOPINIRole (REQUIP) 2 MG tablet Take 3 tablets (6 mg total) by mouth at bedtime.  . sertraline (ZOLOFT) 50 MG tablet Take 1 tablet (50 mg total) by mouth daily.  . sildenafil (REVATIO) 20 MG tablet 1-5 tablets before intercourse 30 minutes to 4 hrs  . trazodone (DESYREL) 300 MG tablet Take 1 tablet (300 mg total) by mouth at bedtime.   No facility-administered encounter medications on file as of 01/23/2020.

## 2020-01-24 ENCOUNTER — Other Ambulatory Visit: Payer: Self-pay | Admitting: Internal Medicine

## 2020-01-24 ENCOUNTER — Telehealth: Payer: Self-pay | Admitting: Family Medicine

## 2020-01-24 DIAGNOSIS — F419 Anxiety disorder, unspecified: Secondary | ICD-10-CM

## 2020-01-24 DIAGNOSIS — F329 Major depressive disorder, single episode, unspecified: Secondary | ICD-10-CM

## 2020-01-24 DIAGNOSIS — F32A Depression, unspecified: Secondary | ICD-10-CM

## 2020-01-24 DIAGNOSIS — G4700 Insomnia, unspecified: Secondary | ICD-10-CM

## 2020-01-24 MED ORDER — TRAZODONE HCL 300 MG PO TABS: 300.0000 mg | ORAL_TABLET | Freq: Every day | ORAL | 5 refills | Status: DC

## 2020-01-24 NOTE — Telephone Encounter (Signed)
I referred for chronic ankle pain to podiatry he needs to f/u with then 1st  If needed will refer to vascular after podiatry f/u   Inform pt

## 2020-01-24 NOTE — Telephone Encounter (Signed)
Left message to return call 

## 2020-01-24 NOTE — Telephone Encounter (Signed)
Patient informed and verbalized understanding.  Given the number to call podiatry and be scheduled. Phone: [phone]

## 2020-01-24 NOTE — Telephone Encounter (Signed)
Pt returned your call.  

## 2020-01-24 NOTE — Telephone Encounter (Signed)
From a Sports Medicine / Orthopedic standpoint, he is doing much better.   I think it may be confusing if multiple people try to manage his swelling and venous stasis disease.  I would rather involve his PCP office.  If they have a strong preference, then I can address edema acutely.

## 2020-01-24 NOTE — Telephone Encounter (Signed)
Patient requesting something be called into the pharmacy for his ankle swelling.  Ankle is swelling and pain (2/10 pain).   Pharmacy: [facility]

## 2020-01-28 ENCOUNTER — Ambulatory Visit: Payer: PPO | Admitting: Podiatry

## 2020-01-28 ENCOUNTER — Ambulatory Visit (INDEPENDENT_AMBULATORY_CARE_PROVIDER_SITE_OTHER): Payer: PPO

## 2020-01-28 ENCOUNTER — Other Ambulatory Visit: Payer: Self-pay

## 2020-01-28 DIAGNOSIS — M659 Synovitis and tenosynovitis, unspecified: Secondary | ICD-10-CM

## 2020-01-28 DIAGNOSIS — M25571 Pain in right ankle and joints of right foot: Secondary | ICD-10-CM

## 2020-01-30 ENCOUNTER — Other Ambulatory Visit: Payer: Self-pay | Admitting: Podiatry

## 2020-01-30 DIAGNOSIS — M659 Synovitis and tenosynovitis, unspecified: Secondary | ICD-10-CM

## 2020-02-02 NOTE — Progress Notes (Signed)
   Subjective:  69 y.o. male presenting today as a new patient with a chief complaint of intermittent soreness of the right ankle that began 4 weeks ago. He reports associated swelling. He has taken a Prednisone Pak prescribed by Dr. Edilia Bo about three weeks ago. Being on the foot and ankle increases the symptoms. He denies any known trauma or injury. He reports being diagnosed with arthritis of the ankle in 2019. Patient is here for further evaluation and treatment.    Past Medical History:  Diagnosis Date  . Adenomatous colon polyp    tubular  . Adenomatous rectal polyp    tubular  . Asthma   . Depression   . Diverticulosis   . GERD (gastroesophageal reflux disease)   . History of chicken pox   . Hyperlipidemia   . Hypertension   . Lung nodule    CT 10/2014  . OSA (obstructive sleep apnea)   . Sleep apnea    wears cpap  . Vitamin D deficiency      Objective / Physical Exam:  General:  The patient is alert and oriented x3 in no acute distress. Dermatology:  Skin is warm, dry and supple bilateral lower extremities. Negative for open lesions or macerations. Vascular:  Palpable pedal pulses bilaterally. No edema or erythema noted. Capillary refill within normal limits. Neurological:  Epicritic and protective threshold grossly intact bilaterally.  Musculoskeletal Exam:  Pain on palpation to the anterior lateral medial aspects of the patient's right ankle. Mild edema noted. Range of motion within normal limits to all pedal and ankle joints bilateral. Muscle strength 5/5 in all groups bilateral.   Radiographic Exam:  Normal osseous mineralization. Joint spaces preserved. No fracture/dislocation/boney destruction.    Assessment: 1. Right ankle synovitis / DJD   Plan of Care:  1. Patient was evaluated. X-Rays reviewed.  2. Injection of 0.5 mL Celestone Soluspan injected in the patient's right ankle. 3. Ankle brace dispensed.  4. Continue taking OTC Motrin as needed.     Edrick Kins, DPM [facility]  Dr. Edrick Kins, [address]                                        [place], [place] [address]                Office [phone]  Fax [phone]

## 2020-02-06 ENCOUNTER — Other Ambulatory Visit: Payer: Self-pay

## 2020-02-06 ENCOUNTER — Ambulatory Visit: Payer: PPO | Admitting: Podiatry

## 2020-02-06 ENCOUNTER — Other Ambulatory Visit: Payer: Self-pay | Admitting: Internal Medicine

## 2020-02-06 DIAGNOSIS — I10 Essential (primary) hypertension: Secondary | ICD-10-CM

## 2020-02-06 DIAGNOSIS — M659 Synovitis and tenosynovitis, unspecified: Secondary | ICD-10-CM | POA: Diagnosis not present

## 2020-02-06 DIAGNOSIS — M76821 Posterior tibial tendinitis, right leg: Secondary | ICD-10-CM

## 2020-02-06 DIAGNOSIS — M25571 Pain in right ankle and joints of right foot: Secondary | ICD-10-CM | POA: Diagnosis not present

## 2020-02-06 MED ORDER — METHYLPREDNISOLONE 4 MG PO TBPK: ORAL_TABLET | ORAL | 0 refills | Status: DC

## 2020-02-06 MED ORDER — AMLODIPINE BESYLATE 5 MG PO TABS: 5.0000 mg | ORAL_TABLET | Freq: Every day | ORAL | 3 refills | Status: DC

## 2020-02-07 ENCOUNTER — Encounter: Payer: Self-pay | Admitting: Podiatry

## 2020-02-07 NOTE — Progress Notes (Signed)
Subjective:  Patient ID: Paul Cook, male    DOB: Dec 12, 1950,  MRN: [id number]  Chief Complaint  Patient presents with  . Foot Pain    pt is here for bil foot pain f/u, pt states that he is feeling a little better but states that he is still feeling some pain. Pt also states that the injection, as well as the trilock brace has been helping as well.    69 y.o. male presents with the above complaint.  Patient presents with pain/soreness to the right medial aspect of the foot.  Patient is well-known to Dr. Amalia Hailey who did an injection and diagnosed him for right ankle synovitis/DJD.  Patient states he is doing a little bit better on the ankle joint.  However he started to have pain on the medial side of the foot away from the ankle.  He denies any other acute treatment options for this.  He has been wearing his brace which has been helping him a lot.  He denies any other acute complaints.   Review of Systems: Negative except as noted in the HPI. Denies N/V/F/Ch.  Past Medical History:  Diagnosis Date  . Adenomatous colon polyp    tubular  . Adenomatous rectal polyp    tubular  . Asthma   . Depression   . Diverticulosis   . GERD (gastroesophageal reflux disease)   . History of chicken pox   . Hyperlipidemia   . Hypertension   . Lung nodule    CT 10/2014  . OSA (obstructive sleep apnea)   . Sleep apnea    wears cpap  . Vitamin D deficiency     Current Outpatient Medications:  .  albuterol (VENTOLIN HFA) 108 (90 Base) MCG/ACT inhaler, Inhale 1-2 puffs into the lungs every 6 (six) hours as needed for wheezing or shortness of breath., Disp: 18 g, Rfl: 11 .  amLODipine (NORVASC) 5 MG tablet, Take 1 tablet (5 mg total) by mouth daily., Disp: 90 tablet, Rfl: 3 .  Cholecalciferol 50 MCG (2000 UT) CAPS, Take 2,000 Units by mouth., Disp: , Rfl:  .  diclofenac Sodium (VOLTAREN) 1 % GEL, Apply 2-4 g topically 4 (four) times daily., Disp: 150 g, Rfl: 11 .  dicyclomine (BENTYL) 10 MG capsule,  Take 1 capsule (10 mg total) by mouth 2 (two) times daily., Disp: 180 capsule, Rfl: 3 .  ezetimibe (ZETIA) 10 MG tablet, Take 1 tablet (10 mg total) by mouth daily., Disp: 90 tablet, Rfl: 3 .  famotidine (PEPCID) 20 MG tablet, Take 1 tablet (20 mg total) by mouth daily., Disp: 90 tablet, Rfl: 3 .  gabapentin (NEURONTIN) 600 MG tablet, TAKE 1 TABLET(600 MG) BY MOUTH THREE TIMES DAILY, Disp: 270 tablet, Rfl: 3 .  lisinopril-hydrochlorothiazide (ZESTORETIC) 20-12.5 MG tablet, Take 2 tablets by mouth daily., Disp: 180 tablet, Rfl: 3 .  LORazepam (ATIVAN) 0.5 MG tablet, Take 1-2 tablets (0.5-1 mg total) by mouth at bedtime as needed for anxiety., Disp: 60 tablet, Rfl: 2 .  nystatin cream (MYCOSTATIN), Apply 1 application topically 2 (two) times daily., Disp: 90 g, Rfl: 11 .  rOPINIRole (REQUIP) 2 MG tablet, Take 3 tablets (6 mg total) by mouth at bedtime., Disp: 270 tablet, Rfl: 3 .  sertraline (ZOLOFT) 50 MG tablet, Take 1 tablet (50 mg total) by mouth daily., Disp: 90 tablet, Rfl: 3 .  sildenafil (REVATIO) 20 MG tablet, 1-5 tablets before intercourse 30 minutes to 4 hrs, Disp: 50 tablet, Rfl: 11 .  trazodone (DESYREL) 300 MG  tablet, Take 1 tablet (300 mg total) by mouth at bedtime., Disp: 31 tablet, Rfl: 5 .  methylPREDNISolone (MEDROL DOSEPAK) 4 MG TBPK tablet, Use as directed, Disp: 1 each, Rfl: 0  Social History   Tobacco Use  Smoking Status Never Smoker  Smokeless Tobacco Never Used    Allergies  Allergen Reactions  . Nsaids Other (See Comments)    GI Issues   Objective:  There were no vitals filed for this visit. There is no height or weight on file to calculate BMI. Constitutional Well developed. Well nourished.  Vascular Dorsalis pedis pulses palpable bilaterally. Posterior tibial pulses palpable bilaterally. Capillary refill normal to all digits.  No cyanosis or clubbing noted. Pedal hair growth normal.  Neurologic Normal speech. Oriented to person, place, and  time. Epicritic sensation to light touch grossly present bilaterally.  Dermatologic Nails well groomed and normal in appearance. No open wounds. No skin lesions.  Orthopedic:  Mild pain with range of motion active and passive of the ankle joint.  Mild pain with the medial gutter of the ankle joint.  Pain along the course of the posterior tibial tendon.  Mild swelling associated with the posterior tibial tendon.  Pain with eversion and inversion of the foot active and passive.  No pain at the Achilles tendon, peroneal tendon, ATFL ligament.   Radiographs: None Assessment:   1. Synovitis of right ankle   2. Right ankle pain, unspecified chronicity   3. Posterior tibial tendinitis, right    Plan:  Patient was evaluated and treated and all questions answered.  Right posterior tibial tendinitis -I explained to the patient the etiology of posterior tibial tendinitis and various treatment options were extensively discussed.  I explained to the patient the Tri-Lock ankle brace will help with this as well.  Given the amount of inflammation as well as swelling there is associated with it.  I believe he will benefit from a steroid injection in the around the tendon but not within it.  I also explained to the patient that there is high risk of rupture associated with steroid near the tendon.  Patient understands the risk and would like to proceed with a steroid injection. -A steroid injection was performed at right medial foot at point of maximal tenderness using 1% plain Lidocaine and 10 mg of Kenalog. This was well tolerated. -He will also benefit from Medrol Dosepak to decrease inflammation that he is having to the right foot.  Prescription was sent to the pharmacy  Right ankle joint degenerative joint disease -Clinically patient's pain has improved with 1 steroid injection that was given by Dr. Amalia Hailey. -Tri-Lock brace appears to be helping really well. -He will continue follow-up with Dr. Amalia Hailey again  in 4 weeks.   No follow-ups on file.

## 2020-03-05 ENCOUNTER — Telehealth: Payer: Self-pay | Admitting: Internal Medicine

## 2020-03-05 DIAGNOSIS — I10 Essential (primary) hypertension: Secondary | ICD-10-CM

## 2020-03-05 MED ORDER — AMLODIPINE BESYLATE 5 MG PO TABS: 5.0000 mg | ORAL_TABLET | Freq: Every day | ORAL | 3 refills | Status: DC

## 2020-03-05 NOTE — Telephone Encounter (Signed)
Pt needs a refill on amLODipine (NORVASC) 5 MG tablet sent to [facility] on [facility]

## 2020-03-12 ENCOUNTER — Ambulatory Visit: Payer: PPO | Admitting: Podiatry

## 2020-03-12 ENCOUNTER — Other Ambulatory Visit: Payer: Self-pay

## 2020-03-12 DIAGNOSIS — Q666 Other congenital valgus deformities of feet: Secondary | ICD-10-CM | POA: Diagnosis not present

## 2020-03-12 DIAGNOSIS — M76821 Posterior tibial tendinitis, right leg: Secondary | ICD-10-CM | POA: Diagnosis not present

## 2020-03-18 NOTE — Progress Notes (Signed)
Subjective:  Patient ID: Paul Cook, male    DOB: 06-Oct-1951,  MRN: [id number]  Chief Complaint  Patient presents with  . Foot Pain    pt is here for a f/u of the right ankle, pt states that he is feeling  alot better since the last time he was here, pt also states that he no longer has any swelling or pain at all.    69 y.o. male presents with the above complaint.  Patient presents with a follow-up of right posterior tibial tendinitis.  Patient states the injection helped a lot.  The injection brought his pain down to more manageable.  It is doing a lot better.  He has been wearing his Tri-Lock ankle brace which has also been giving him some stability.  He would like to discuss further treatment options as well as possible another injection he can make the pain go away completely.   Review of Systems: Negative except as noted in the HPI. Denies N/V/F/Ch.  Past Medical History:  Diagnosis Date  . Adenomatous colon polyp    tubular  . Adenomatous rectal polyp    tubular  . Asthma   . Depression   . Diverticulosis   . GERD (gastroesophageal reflux disease)   . History of chicken pox   . Hyperlipidemia   . Hypertension   . Lung nodule    CT 10/2014  . OSA (obstructive sleep apnea)   . Sleep apnea    wears cpap  . Vitamin D deficiency     Current Outpatient Medications:  .  albuterol (VENTOLIN HFA) 108 (90 Base) MCG/ACT inhaler, Inhale 1-2 puffs into the lungs every 6 (six) hours as needed for wheezing or shortness of breath., Disp: 18 g, Rfl: 11 .  amLODipine (NORVASC) 5 MG tablet, Take 1 tablet (5 mg total) by mouth daily., Disp: 90 tablet, Rfl: 3 .  Cholecalciferol 50 MCG (2000 UT) CAPS, Take 2,000 Units by mouth., Disp: , Rfl:  .  diclofenac Sodium (VOLTAREN) 1 % GEL, Apply 2-4 g topically 4 (four) times daily., Disp: 150 g, Rfl: 11 .  dicyclomine (BENTYL) 10 MG capsule, Take 1 capsule (10 mg total) by mouth 2 (two) times daily., Disp: 180 capsule, Rfl: 3 .  ezetimibe  (ZETIA) 10 MG tablet, Take 1 tablet (10 mg total) by mouth daily., Disp: 90 tablet, Rfl: 3 .  famotidine (PEPCID) 20 MG tablet, Take 1 tablet (20 mg total) by mouth daily., Disp: 90 tablet, Rfl: 3 .  gabapentin (NEURONTIN) 600 MG tablet, TAKE 1 TABLET(600 MG) BY MOUTH THREE TIMES DAILY, Disp: 270 tablet, Rfl: 3 .  lisinopril-hydrochlorothiazide (ZESTORETIC) 20-12.5 MG tablet, Take 2 tablets by mouth daily., Disp: 180 tablet, Rfl: 3 .  LORazepam (ATIVAN) 0.5 MG tablet, Take 1-2 tablets (0.5-1 mg total) by mouth at bedtime as needed for anxiety., Disp: 60 tablet, Rfl: 2 .  methylPREDNISolone (MEDROL DOSEPAK) 4 MG TBPK tablet, Use as directed, Disp: 1 each, Rfl: 0 .  nystatin cream (MYCOSTATIN), Apply 1 application topically 2 (two) times daily., Disp: 90 g, Rfl: 11 .  rOPINIRole (REQUIP) 2 MG tablet, Take 3 tablets (6 mg total) by mouth at bedtime., Disp: 270 tablet, Rfl: 3 .  sertraline (ZOLOFT) 50 MG tablet, Take 1 tablet (50 mg total) by mouth daily., Disp: 90 tablet, Rfl: 3 .  sildenafil (REVATIO) 20 MG tablet, 1-5 tablets before intercourse 30 minutes to 4 hrs, Disp: 50 tablet, Rfl: 11 .  trazodone (DESYREL) 300 MG tablet, Take 1 tablet (  300 mg total) by mouth at bedtime., Disp: 31 tablet, Rfl: 5  Social History   Tobacco Use  Smoking Status Never Smoker  Smokeless Tobacco Never Used    Allergies  Allergen Reactions  . Nsaids Other (See Comments)    GI Issues   Objective:  There were no vitals filed for this visit. There is no height or weight on file to calculate BMI. Constitutional Well developed. Well nourished.  Vascular Dorsalis pedis pulses palpable bilaterally. Posterior tibial pulses palpable bilaterally. Capillary refill normal to all digits.  No cyanosis or clubbing noted. Pedal hair growth normal.  Neurologic Normal speech. Oriented to person, place, and time. Epicritic sensation to light touch grossly present bilaterally.  Dermatologic Nails well groomed and normal  in appearance. No open wounds. No skin lesions.  Orthopedic:  Mild pain with range of motion active and passive of the ankle joint.  Mild pain with the medial gutter of the ankle joint.  Moderate pain along the course of the posterior tibial tendon.  Mild swelling associated with the posterior tibial tendon.  Mild pain with eversion and inversion of the foot active and passive.  No pain at the Achilles tendon, peroneal tendon, ATFL ligament.   Radiographs: None Assessment:   No diagnosis found. Plan:  Patient was evaluated and treated and all questions answered.  Right posterior tibial tendinitis~improving -Given the patient's pain has considerably improved after for steroid injection I believe patient will benefit from a second steroid injection to get him closer to 100%.  I discussed with the patient that given that this is near the tendon there is a high risk of rupture associated with it.  Patient would like to proceed despite the risks.  Second steroid injection was given in standard technique. -A steroid injection was performed at right medial ankle along the course of posterior tibial tendon using 1% plain Lidocaine and 10 mg of Kenalog. This was well tolerated.   Semiflexible pes planus deformity -I explained the patient the etiology of pes planovalgus and various treatment options were extensively discussed.  Given that patient has posterior tibial tendinitis I explained to the patient the relationship of posterior tibial tendinitis to pes planus.  I believe that patient would benefit from taking the stress off of the tendon with orthotics.  He will be scheduled to see [facility] for custom-made orthotics.  Right ankle joint degenerative joint disease -Clinically patient's pain has improved with 1 steroid injection that was given by Dr. Amalia Hailey. -Tri-Lock brace appears to be helping really well. -He will continue follow-up with Dr. Amalia Hailey again in 4 weeks.   Return in about 6 weeks (around  04/23/2020) for See Liliane Channel for orthotics ASAP.

## 2020-03-19 ENCOUNTER — Encounter: Payer: Self-pay | Admitting: Podiatry

## 2020-03-25 ENCOUNTER — Other Ambulatory Visit: Payer: PPO | Admitting: Orthotics

## 2020-04-07 ENCOUNTER — Ambulatory Visit: Payer: PPO | Admitting: Podiatry

## 2020-04-07 ENCOUNTER — Other Ambulatory Visit: Payer: Self-pay

## 2020-04-07 DIAGNOSIS — M76821 Posterior tibial tendinitis, right leg: Secondary | ICD-10-CM | POA: Diagnosis not present

## 2020-04-08 ENCOUNTER — Encounter: Payer: Self-pay | Admitting: Podiatry

## 2020-04-08 NOTE — Progress Notes (Signed)
Subjective:  Patient ID: Paul Cook, male    DOB: [birth year],  MRN: [id number]  Chief Complaint  Patient presents with  . Foot Pain    pt is here for right foot pain, pt states that the right foot pain is doing a lot better, but states that there still some swelling to his right ankle.    69 y.o. male presents with the above complaint.  Patient presents with a follow-up of right posterior tibial tendinitis.  Patient states the injection helped a lot.  The injection brought his pain down to more manageable.  It is doing a lot better.  He has been wearing his Tri-Lock ankle brace which has also been giving him some stability.  He would like to discuss further treatment options as well as possible another injection he can make the pain go away completely.   Review of Systems: Negative except as noted in the HPI. Denies N/V/F/Ch.  Past Medical History:  Diagnosis Date  . Adenomatous colon polyp    tubular  . Adenomatous rectal polyp    tubular  . Asthma   . Depression   . Diverticulosis   . GERD (gastroesophageal reflux disease)   . History of chicken pox   . Hyperlipidemia   . Hypertension   . Lung nodule    CT 10/2014  . OSA (obstructive sleep apnea)   . Sleep apnea    wears cpap  . Vitamin D deficiency     Current Outpatient Medications:  .  albuterol (VENTOLIN HFA) 108 (90 Base) MCG/ACT inhaler, Inhale 1-2 puffs into the lungs every 6 (six) hours as needed for wheezing or shortness of breath., Disp: 18 g, Rfl: 11 .  amLODipine (NORVASC) 5 MG tablet, Take 1 tablet (5 mg total) by mouth daily., Disp: 90 tablet, Rfl: 3 .  Cholecalciferol 50 MCG (2000 UT) CAPS, Take 2,000 Units by mouth., Disp: , Rfl:  .  diclofenac Sodium (VOLTAREN) 1 % GEL, Apply 2-4 g topically 4 (four) times daily., Disp: 150 g, Rfl: 11 .  dicyclomine (BENTYL) 10 MG capsule, Take 1 capsule (10 mg total) by mouth 2 (two) times daily., Disp: 180 capsule, Rfl: 3 .  ezetimibe (ZETIA) 10 MG tablet, Take 1 tablet  (10 mg total) by mouth daily., Disp: 90 tablet, Rfl: 3 .  famotidine (PEPCID) 20 MG tablet, Take 1 tablet (20 mg total) by mouth daily., Disp: 90 tablet, Rfl: 3 .  gabapentin (NEURONTIN) 600 MG tablet, TAKE 1 TABLET(600 MG) BY MOUTH THREE TIMES DAILY, Disp: 270 tablet, Rfl: 3 .  lisinopril-hydrochlorothiazide (ZESTORETIC) 20-12.5 MG tablet, Take 2 tablets by mouth daily., Disp: 180 tablet, Rfl: 3 .  LORazepam (ATIVAN) 0.5 MG tablet, Take 1-2 tablets (0.5-1 mg total) by mouth at bedtime as needed for anxiety., Disp: 60 tablet, Rfl: 2 .  methylPREDNISolone (MEDROL DOSEPAK) 4 MG TBPK tablet, Use as directed, Disp: 1 each, Rfl: 0 .  nystatin cream (MYCOSTATIN), Apply 1 application topically 2 (two) times daily., Disp: 90 g, Rfl: 11 .  rOPINIRole (REQUIP) 2 MG tablet, Take 3 tablets (6 mg total) by mouth at bedtime., Disp: 270 tablet, Rfl: 3 .  sertraline (ZOLOFT) 50 MG tablet, Take 1 tablet (50 mg total) by mouth daily., Disp: 90 tablet, Rfl: 3 .  sildenafil (REVATIO) 20 MG tablet, 1-5 tablets before intercourse 30 minutes to 4 hrs, Disp: 50 tablet, Rfl: 11 .  trazodone (DESYREL) 300 MG tablet, Take 1 tablet (300 mg total) by mouth at bedtime., Disp: 31 tablet,  Rfl: 5  Social History   Tobacco Use  Smoking Status Never Smoker  Smokeless Tobacco Never Used    Allergies  Allergen Reactions  . Nsaids Other (See Comments)    GI Issues   Objective:  There were no vitals filed for this visit. There is no height or weight on file to calculate BMI. Constitutional Well developed. Well nourished.  Vascular Dorsalis pedis pulses palpable bilaterally. Posterior tibial pulses palpable bilaterally. Capillary refill normal to all digits.  No cyanosis or clubbing noted. Pedal hair growth normal.  Neurologic Normal speech. Oriented to person, place, and time. Epicritic sensation to light touch grossly present bilaterally.  Dermatologic Nails well groomed and normal in appearance. No open wounds. No  skin lesions.  Orthopedic:  Mild pain with range of motion active and passive of the ankle joint.  Mild pain with the medial gutter of the ankle joint.  Moderate pain along the course of the posterior tibial tendon.  Mild swelling associated with the posterior tibial tendon.  Mild pain with eversion and inversion of the foot active and passive.  No pain at the Achilles tendon, peroneal tendon, ATFL ligament.   Radiographs: None Assessment:   1. Posterior tibial tendinitis, right    Plan:  Patient was evaluated and treated and all questions answered.  Right posterior tibial tendinitis~improving -Given the patient's pain has considerably improved after for steroid injection I believe patient will benefit from a second steroid injection to get him closer to 100%.  I discussed with the patient that given that this is near the tendon there is a high risk of rupture associated with it.  Patient would like to proceed despite the risks.  Third steroid injection was given in standard technique. -A steroid injection was performed at right medial ankle along the course of posterior tibial tendon using 1% plain Lidocaine and 10 mg of Kenalog. This was well tolerated.   Semiflexible pes planus deformity -I explained the patient the etiology of pes planovalgus and various treatment options were extensively discussed.  Given that patient has posterior tibial tendinitis I explained to the patient the relationship of posterior tibial tendinitis to pes planus.  I believe that patient would benefit from taking the stress off of the tendon with orthotics.  He will be scheduled to see [facility] for custom-made orthotics.  Right ankle joint degenerative joint disease -Clinically patient's pain has improved with 1 steroid injection that was given by Dr. Amalia Hailey. -Tri-Lock brace appears to be helping really well.   No follow-ups on file.

## 2020-04-15 ENCOUNTER — Other Ambulatory Visit: Payer: Self-pay | Admitting: Neurology

## 2020-04-15 DIAGNOSIS — R413 Other amnesia: Secondary | ICD-10-CM

## 2020-04-17 ENCOUNTER — Ambulatory Visit: Payer: PPO | Admitting: Family Medicine

## 2020-04-21 ENCOUNTER — Ambulatory Visit: Payer: PPO | Admitting: Family Medicine

## 2020-04-21 NOTE — Progress Notes (Deleted)
    Subjective:    CC: R ankle pain and swelling  I,  , LAT, ATC, am serving as scribe for Dr. Lynne Leader.  HPI: Pt is a 69 y/o male presenting w/ c/o R ankle pain and swelling since April 2021 w/ no known MOI.  He has been seen by his PCP and by podiatry.  He locates his pain to .  Radiating pain: R ankle/foot swelling: Aggravating factors: R LE weight bearing: Treatments tried: prednisone dose pack; R ankle injection- 01/28/20 and 04/07/20  Diagnostic imaging: R ankle XR- 01/28/20, 09/06/18  Pertinent review of Systems: ***  Relevant historical information: ***   Objective:   There were no vitals filed for this visit. General: Well Developed, well nourished, and in no acute distress.   MSK: ***  Lab and Radiology Results No results found for this or any previous visit (from the past 72 hour(s)). No results found.    Impression and Recommendations:    Assessment and Plan: 69 y.o. male with ***.  PDMP not reviewed this encounter. No orders of the defined types were placed in this encounter.  No orders of the defined types were placed in this encounter.   Discussed warning signs or symptoms. Please see discharge instructions. Patient expresses understanding.   ***

## 2020-04-23 ENCOUNTER — Ambulatory Visit
Admission: RE | Admit: 2020-04-23 | Discharge: 2020-04-23 | Disposition: A | Discharge status: Home / Self Care | Payer: PPO | Source: Ambulatory Visit | Attending: Neurology | Admitting: Neurology

## 2020-04-23 ENCOUNTER — Ambulatory Visit (INDEPENDENT_AMBULATORY_CARE_PROVIDER_SITE_OTHER): Payer: PPO

## 2020-04-23 ENCOUNTER — Ambulatory Visit: Payer: PPO | Admitting: Podiatry

## 2020-04-23 ENCOUNTER — Other Ambulatory Visit: Payer: Self-pay

## 2020-04-23 VITALS — Ht 66.0 in | Wt 315.0 lb

## 2020-04-23 DIAGNOSIS — Z0000 Encounter for general adult medical examination without abnormal findings: Secondary | ICD-10-CM

## 2020-04-23 DIAGNOSIS — R413 Other amnesia: Secondary | ICD-10-CM

## 2020-04-23 DIAGNOSIS — I672 Cerebral atherosclerosis: Secondary | ICD-10-CM | POA: Diagnosis not present

## 2020-04-23 DIAGNOSIS — R9082 White matter disease, unspecified: Secondary | ICD-10-CM | POA: Diagnosis not present

## 2020-04-23 NOTE — Patient Instructions (Addendum)
[name] , Thank you for taking time to come for your Medicare Wellness Visit. I appreciate your ongoing commitment to your health goals. Please review the following plan we discussed and let me know if I can assist you in the future.   These are the goals we discussed: Goals      Patient Stated   .  Blood Pressure < 140/90 (pt-stated)      Buy a new blood pressure machine with reading at 130/80       This is a list of the screening recommended for you and due dates:  Health Maintenance  Topic Date Due  . COVID-19 Vaccine (1) 05/09/2020*  . Colon Cancer Screening  04/23/2021*  . Flu Shot  05/17/2020  . Tetanus Vaccine  04/17/2024  . Pneumonia vaccines  Completed  *Topic was postponed. The date shown is not the original due date.    Immunizations Immunization History  Administered Date(s) Administered  . Fluad Quad(high Dose 65+) 07/22/2019  . Influenza Whole 06/19/2013  . Influenza, High Dose Seasonal PF 06/28/2018  . Influenza,inj,Quad PF,6+ Mos 07/15/2014, 07/18/2016  . Influenza-Unspecified 06/18/2015  . Pneumococcal Conjugate-13 03/30/2017  . Pneumococcal Polysaccharide-23 04/04/2018  . Tdap 04/17/2014  . Zoster 12/18/2013   Covid vaccine- completed Pfizer 2 months ago. Agrees to provide a copy of the vaccination record.   Advanced directives: End of life planning; Advance aging; Advanced directives discussed.  Copy of current HCPOA/Living Will requested.    Conditions/risks identified: none new.  Follow up in one year for your annual wellness visit.   Preventive Care 52 Years and Older, Male Preventive care refers to lifestyle choices and visits with your health care provider that can promote health and wellness. What does preventive care include?  A yearly physical exam. This is also called an annual well check.  Dental exams once or twice a year.  Routine eye exams. Ask your health care provider how often you should have your eyes checked.  Personal  lifestyle choices, including:  Daily care of your teeth and gums.  Regular physical activity.  Eating a healthy diet.  Avoiding tobacco and drug use.  Limiting alcohol use.  Practicing safe sex.  Taking low doses of aspirin every day.  Taking vitamin and mineral supplements as recommended by your health care provider. What happens during an annual well check? The services and screenings done by your health care provider during your annual well check will depend on your age, overall health, lifestyle risk factors, and family history of disease. Counseling  Your health care provider may ask you questions about your:  Alcohol use.  Tobacco use.  Drug use.  Emotional well-being.  Home and relationship well-being.  Sexual activity.  Eating habits.  History of falls.  Memory and ability to understand (cognition).  Work and work Statistician. Screening  You may have the following tests or measurements:  Height, weight, and BMI.  Blood pressure.  Lipid and cholesterol levels. These may be checked every 5 years, or more frequently if you are over 73 years old.  Skin check.  Lung cancer screening. You may have this screening every year starting at age 61 if you have a 30-pack-year history of smoking and currently smoke or have quit within the past 15 years.  Fecal occult blood test (FOBT) of the stool. You may have this test every year starting at age 6.  Flexible sigmoidoscopy or colonoscopy. You may have a sigmoidoscopy every 5 years or a colonoscopy every 10  years starting at age 12.  Prostate cancer screening. Recommendations will vary depending on your family history and other risks.  Hepatitis C blood test.  Hepatitis B blood test.  Sexually transmitted disease (STD) testing.  Diabetes screening. This is done by checking your blood sugar (glucose) after you have not eaten for a while (fasting). You may have this done every 1-3 years.  Abdominal aortic  aneurysm (AAA) screening. You may need this if you are a current or former smoker.  Osteoporosis. You may be screened starting at age 72 if you are at high risk. Talk with your health care provider about your test results, treatment options, and if necessary, the need for more tests. Vaccines  Your health care provider may recommend certain vaccines, such as:  Influenza vaccine. This is recommended every year.  Tetanus, diphtheria, and acellular pertussis (Tdap, Td) vaccine. You may need a Td booster every 10 years.  Zoster vaccine. You may need this after age 74.  Pneumococcal 13-valent conjugate (PCV13) vaccine. One dose is recommended after age 4.  Pneumococcal polysaccharide (PPSV23) vaccine. One dose is recommended after age 62. Talk to your health care provider about which screenings and vaccines you need and how often you need them. This information is not intended to replace advice given to you by your health care provider. Make sure you discuss any questions you have with your health care provider. Document Released: 10/30/2015 Document Revised: 06/22/2016 Document Reviewed: 08/04/2015 Elsevier Interactive Patient Education  2017 [place] Prevention in the Home Falls can cause injuries. They can happen to people of all ages. There are many things you can do to make your home safe and to help prevent falls. What can I do on the outside of my home?  Regularly fix the edges of walkways and driveways and fix any cracks.  Remove anything that might make you trip as you walk through a door, such as a raised step or threshold.  Trim any bushes or trees on the path to your home.  Use bright outdoor lighting.  Clear any walking paths of anything that might make someone trip, such as rocks or tools.  Regularly check to see if handrails are loose or broken. Make sure that both sides of any steps have handrails.  Any raised decks and porches should have guardrails on  the edges.  Have any leaves, snow, or ice cleared regularly.  Use sand or salt on walking paths during winter.  Clean up any spills in your garage right away. This includes oil or grease spills. What can I do in the bathroom?  Use night lights.  Install grab bars by the toilet and in the tub and shower. Do not use towel bars as grab bars.  Use non-skid mats or decals in the tub or shower.  If you need to sit down in the shower, use a plastic, non-slip stool.  Keep the floor dry. Clean up any water that spills on the floor as soon as it happens.  Remove soap buildup in the tub or shower regularly.  Attach bath mats securely with double-sided non-slip rug tape.  Do not have throw rugs and other things on the floor that can make you trip. What can I do in the bedroom?  Use night lights.  Make sure that you have a light by your bed that is easy to reach.  Do not use any sheets or blankets that are too big for your bed. They should not hang  down onto the floor.  Have a firm chair that has side arms. You can use this for support while you get dressed.  Do not have throw rugs and other things on the floor that can make you trip. What can I do in the kitchen?  Clean up any spills right away.  Avoid walking on wet floors.  Keep items that you use a lot in easy-to-reach places.  If you need to reach something above you, use a strong step stool that has a grab bar.  Keep electrical cords out of the way.  Do not use floor polish or wax that makes floors slippery. If you must use wax, use non-skid floor wax.  Do not have throw rugs and other things on the floor that can make you trip. What can I do with my stairs?  Do not leave any items on the stairs.  Make sure that there are handrails on both sides of the stairs and use them. Fix handrails that are broken or loose. Make sure that handrails are as long as the stairways.  Check any carpeting to make sure that it is firmly  attached to the stairs. Fix any carpet that is loose or worn.  Avoid having throw rugs at the top or bottom of the stairs. If you do have throw rugs, attach them to the floor with carpet tape.  Make sure that you have a light switch at the top of the stairs and the bottom of the stairs. If you do not have them, ask someone to add them for you. What else can I do to help prevent falls?  Wear shoes that:  Do not have high heels.  Have rubber bottoms.  Are comfortable and fit you well.  Are closed at the toe. Do not wear sandals.  If you use a stepladder:  Make sure that it is fully opened. Do not climb a closed stepladder.  Make sure that both sides of the stepladder are locked into place.  Ask someone to hold it for you, if possible.  Clearly mark and make sure that you can see:  Any grab bars or handrails.  First and last steps.  Where the edge of each step is.  Use tools that help you move around (mobility aids) if they are needed. These include:  Canes.  Walkers.  Scooters.  Crutches.  Turn on the lights when you go into a dark area. Replace any light bulbs as soon as they burn out.  Set up your furniture so you have a clear path. Avoid moving your furniture around.  If any of your floors are uneven, fix them.  If there are any pets around you, be aware of where they are.  Review your medicines with your doctor. Some medicines can make you feel dizzy. This can increase your chance of falling. Ask your doctor what other things that you can do to help prevent falls. This information is not intended to replace advice given to you by your health care provider. Make sure you discuss any questions you have with your health care provider. Document Released: 07/30/2009 Document Revised: 03/10/2016 Document Reviewed: 11/07/2014 Elsevier Interactive Patient Education  2017 [employer].

## 2020-04-23 NOTE — Progress Notes (Signed)
Subjective:   Paul Cook is a 69 y.o. male who presents for Medicare Annual/Subsequent preventive examination.  Review of Systems    No ROS.  Medicare Wellness Virtual Visit.   Cardiac Risk Factors include: advanced age (>65men, >56 women);hypertension;male gender     Objective:    Today's Vitals   04/23/20 1110  Weight: (!) 315 lb (142.9 kg)  Height: 5\' 6"  (1.676 m)   Body mass index is 50.84 kg/m.  Advanced Directives 04/23/2020 04/23/2019 04/04/2018 03/16/2018 02/21/2018 03/30/2017 11/07/2016  Does Patient Have a Medical Advance Directive? Yes Yes No No No No No  Type of Advance Directive Healthcare Power of [place]  Does patient want to make changes to medical advance directive? No - Patient declined No - Patient declined - - - - -  Copy of [place] in Chart? No - copy requested No - copy requested - - - - -  Would patient like information on creating a medical advance directive? - - Yes (MAU/Ambulatory/Procedural Areas - Information given) No - Patient declined - No - Patient declined Yes (MAU/Ambulatory/Procedural Areas - Information given)    Current Medications (verified) Outpatient Encounter Medications as of 04/23/2020  Medication Sig  . albuterol (VENTOLIN HFA) 108 (90 Base) MCG/ACT inhaler Inhale 1-2 puffs into the lungs every 6 (six) hours as needed for wheezing or shortness of breath.  Marland Kitchen amLODipine (NORVASC) 5 MG tablet Take 1 tablet (5 mg total) by mouth daily.  . Cholecalciferol 50 MCG (2000 UT) CAPS Take 2,000 Units by mouth.  . diclofenac Sodium (VOLTAREN) 1 % GEL Apply 2-4 g topically 4 (four) times daily.  Marland Kitchen dicyclomine (BENTYL) 10 MG capsule Take 1 capsule (10 mg total) by mouth 2 (two) times daily.  Marland Kitchen ezetimibe (ZETIA) 10 MG tablet Take 1 tablet (10 mg total) by mouth daily.  . famotidine (PEPCID) 20 MG tablet Take 1 tablet (20 mg total) by mouth daily.  Marland Kitchen gabapentin (NEURONTIN) 600 MG tablet  TAKE 1 TABLET(600 MG) BY MOUTH THREE TIMES DAILY  . lisinopril-hydrochlorothiazide (ZESTORETIC) 20-12.5 MG tablet Take 2 tablets by mouth daily.  Marland Kitchen LORazepam (ATIVAN) 0.5 MG tablet Take 1-2 tablets (0.5-1 mg total) by mouth at bedtime as needed for anxiety.  . methylPREDNISolone (MEDROL DOSEPAK) 4 MG TBPK tablet Use as directed  . nystatin cream (MYCOSTATIN) Apply 1 application topically 2 (two) times daily.  Marland Kitchen rOPINIRole (REQUIP) 2 MG tablet Take 3 tablets (6 mg total) by mouth at bedtime.  . sertraline (ZOLOFT) 50 MG tablet Take 1 tablet (50 mg total) by mouth daily.  . sildenafil (REVATIO) 20 MG tablet 1-5 tablets before intercourse 30 minutes to 4 hrs  . trazodone (DESYREL) 300 MG tablet Take 1 tablet (300 mg total) by mouth at bedtime.   No facility-administered encounter medications on file as of 04/23/2020.    Allergies (verified) Nsaids   History: Past Medical History:  Diagnosis Date  . Adenomatous colon polyp    tubular  . Adenomatous rectal polyp    tubular  . Asthma   . Depression   . Diverticulosis   . GERD (gastroesophageal reflux disease)   . History of chicken pox   . Hyperlipidemia   . Hypertension   . Lung nodule    CT 10/2014  . OSA (obstructive sleep apnea)   . Sleep apnea    wears cpap  . Vitamin D deficiency    Past Surgical History:  Procedure  Laterality Date  . COLONOSCOPY    . COLONOSCOPY W/ POLYPECTOMY  2015   [facility], benign  . HERNIA REPAIR     inguinal  . POLYPECTOMY    . TONSILLECTOMY     age 14    Family History  Problem Relation Age of Onset  . Depression Mother   . Hypertension Father   . Heart disease Father   . Stroke Father   . Cancer Maternal Grandmother        ovarian?  . Cancer Paternal Grandfather        prostate  . Prostate cancer Paternal Grandfather   . Colon cancer Neg Hx   . Colon polyps Neg Hx    Social History   Socioeconomic History  . Marital status: Married    Spouse name: Not on file  . Number of children:  Not on file  . Years of education: Not on file  . Highest education level: Not on file  Occupational History  . Not on file  Tobacco Use  . Smoking status: Never Smoker  . Smokeless tobacco: Never Used  Vaping Use  . Vaping Use: Never used  Substance and Sexual Activity  . Alcohol use: Yes    Alcohol/week: 0.0 standard drinks    Comment: 2-3 beers daily  . Drug use: No  . Sexual activity: Never  Other Topics Concern  . Not on file  Social History Narrative   Lives in [place] with wife, Paul Cook 23 years as of 10/2018. 1 daughter in [place].      Work - Optician, dispensing, now Animal nutritionist      From [place] Strain:   . Difficulty of Paying Living Expenses:   Food Insecurity:   . Worried About Charity fundraiser in the Last Year:   . Arboriculturist in the Last Year:   Transportation Needs: No Transportation Needs  . Lack of Transportation (Medical): No  . Lack of Transportation (Non-Medical): No  Physical Activity:   . Days of Exercise per Week:   . Minutes of Exercise per Session:   Stress: No Stress Concern Present  . Feeling of Stress : Not at all  Social Connections: Moderately Isolated  . Frequency of Communication with Friends and Family: More than three times a week  . Frequency of Social Gatherings with Friends and Family: Never  . Attends Religious Services: Never  . Active Member of Clubs or Organizations: No  . Attends Archivist Meetings: Never  . Marital Status: Married    Tobacco Counseling Counseling given: Not Answered   Clinical Intake:  Pre-visit preparation completed: Yes        Diabetes: No  How often do you need to have someone help you when you read instructions, pamphlets, or other written materials from your doctor or pharmacy?: 1 - Never  Interpreter Needed?: No      Activities of Daily Living In your present state of health, do you have any  difficulty performing the following activities: 04/23/2020  Hearing? N  Vision? N  Difficulty concentrating or making decisions? N  Walking or climbing stairs? Y  Comment R ankle brace worn for support. Paces self.  Dressing or bathing? N  Doing errands, shopping? N  Preparing Food and eating ? Y  Comment Wife assists with cooking. Meal prep.  Using the Toilet? N  In the past six months, have  you accidently leaked urine? N  Do you have problems with loss of bowel control? N  Managing your Medications? N  Managing your Finances? N  Housekeeping or managing your Housekeeping? N  Some recent data might be hidden    Patient Care Team: McLean-Scocuzza, [name], MD as PCP - General (Internal Medicine)  Indicate any recent Medical Services you may have received from other than [facility] providers in the past year (date may be approximate).     Assessment:   This is a routine wellness examination for Paul Cook.  I connected with Alize today by telephone and verified that I am speaking with the correct person using two identifiers. Location patient: home Location provider: work Persons participating in the virtual visit: patient, Marine scientist.    I discussed the limitations, risks, security and privacy concerns of performing an evaluation and management service by telephone and the availability of in person appointments. The patient expressed understanding and verbally consented to this telephonic visit.    Interactive audio and video telecommunications were attempted between this provider and patient, however failed, due to patient having technical difficulties OR patient did not have access to video capability.  We continued and completed visit with audio only.  Some vital signs may be absent or patient reported.   Hearing/Vision screen  Hearing Screening   125Hz  250Hz  500Hz  1000Hz  2000Hz  3000Hz  4000Hz  6000Hz  8000Hz   Right ear:           Left ear:           Comments: Patient is able to hear  conversational tones without difficulty.  No issues reported.  Vision Screening Comments: Wears corrective lenses Visual acuity not assessed, virtual visit.  They have seen their ophthalmologist.   Dietary issues and exercise activities discussed: Current Exercise Habits: The patient does not participate in regular exercise at present  Healthy diet Fair water intake Caffeine- 2 cups daily Beer- 2-3 daily  Goals      Patient Stated   .  Blood Pressure < 140/90 (pt-stated)      Buy a new blood pressure machine with reading at 130/80      Depression Screen PHQ 2/9 Scores 04/23/2020 01/21/2020 11/27/2019 10/17/2019 06/26/2019 04/23/2019 04/09/2019  PHQ - 2 Score 0 0 0 2 0 0 0  PHQ- 9 Score - - - 5 0 - -    Fall Risk Fall Risk  04/23/2020 01/21/2020 11/27/2019 07/17/2019 06/26/2019  Falls in the past year? 0 0 0 0 0  Number falls in past yr: 0 0 0 - 0  Injury with Fall? - 0 0 - 0  Follow up Falls evaluation completed Falls evaluation completed Falls evaluation completed - Falls evaluation completed   Handrails in use when climbing? Yes  Home free of loose throw rugs in walkways, pet beds, electrical cords, etc? Yes  Adequate lighting in your home to reduce risk of falls? Yes   ASSISTIVE DEVICES UTILIZED TO PREVENT FALLS: Life alert? No  Use of a cane, walker or w/c? No  Grab bars in the bathroom? No  Shower chair or bench in shower? Yes  Elevated toilet seat or a handicapped toilet? No   TIMED UP AND GO:  Was the test performed? No . Virtual visit.  Cognitive Function: MMSE - Mini Mental State Exam 04/04/2018  Orientation to time 5  Orientation to Place 5  Registration 3  Attention/ Calculation 5  Recall 3  Language- name 2 objects 2  Language- repeat 1  Language- follow  3 step command 3  Language- read & follow direction 1  Write a sentence 1  Copy design 1  Total score 30     6CIT Screen 04/23/2020 04/23/2019  What Year? 0 points 0 points  What month? 0 points 0 points  What  time? - 0 points  Count back from 20 0 points 0 points  Months in reverse 0 points 0 points  Repeat phrase 0 points 0 points  Total Score - 0    Immunizations Immunization History  Administered Date(s) Administered  . Fluad Quad(high Dose 65+) 07/22/2019  . Influenza Whole 06/19/2013  . Influenza, High Dose Seasonal PF 06/28/2018  . Influenza,inj,Quad PF,6+ Mos 07/15/2014, 07/18/2016  . Influenza-Unspecified 06/18/2015  . Pneumococcal Conjugate-13 03/30/2017  . Pneumococcal Polysaccharide-23 04/04/2018  . Tdap 04/17/2014  . Zoster 12/18/2013   Covid vaccine- completed Pfizer 2 months ago. Agrees to provide a copy of the vaccination record.   Health Maintenance Health Maintenance  Topic Date Due  . COVID-19 Vaccine (1) 05/09/2020 (Originally 09/06/1963)  . COLONOSCOPY  04/23/2021 (Originally 04/12/2019)  . INFLUENZA VACCINE  05/17/2020  . TETANUS/TDAP  04/17/2024  . PNA vac Low Risk Adult  Completed   Colonoscopy- deferred per patient request. Plans to discuss with PMD at a later date.   Lung Cancer Screening: (Low Dose CT Chest recommended if Age 47-80 years, 30 pack-year currently smoking OR have quit w/in 15years.) does not qualify.   Dental Screening: Recommended annual dental exams for proper oral hygiene  Community Resource Referral / Chronic Care Management: CRR required this visit?  No   CCM required this visit?  No      Plan:   Keep all routine maintenance appointments.   I have personally reviewed and noted the following in the patient's chart:   . Medical and social history . Use of alcohol, tobacco or illicit drugs  . Current medications and supplements . Functional ability and status . Nutritional status . Physical activity . Advanced directives . List of other physicians . Hospitalizations, surgeries, and ER visits in previous 12 months . Vitals . Screenings to include cognitive, depression, and falls . Referrals and appointments  In addition,  I have reviewed and discussed with patient certain preventive protocols, quality metrics, and best practice recommendations. A written personalized care plan for preventive services as well as general preventive health recommendations were provided to patient via mychart.     Varney Biles, LPN   [id number]/0/3403   Nurse Notes: Patients reports he did purchase a blood pressure cuff and plans to monitor BP as previously directed by PMD.

## 2020-04-28 ENCOUNTER — Ambulatory Visit: Payer: PPO | Admitting: Family Medicine

## 2020-05-05 ENCOUNTER — Ambulatory Visit: Payer: PPO | Admitting: Family Medicine

## 2020-05-05 NOTE — Progress Notes (Deleted)
    Subjective:    CC: R ankle pain  I,  , LAT, ATC, am serving as scribe for Dr. Lynne Leader.  HPI: Pt is a 69 y/o male presenting w/ c/o R ankle pain.  He has been seen multiple times for these complaints at [facility] in [place] and has had multiple R ankle and post tib injections since April 2021 with the last one being on 04/07/20.  He has been wearing a  Tri-Lock ankle brace.  He locates his pain to .  Radiating pain: R ankle swelling: Aggravating factors: Treatments tried: multiple ankle and post tib injections; Tri-Lock ankle brace;   Pertinent review of Systems: ***  Relevant historical information: ***   Objective:   There were no vitals filed for this visit. General: Well Developed, well nourished, and in no acute distress.   MSK: ***  Lab and Radiology Results No results found for this or any previous visit (from the past 72 hour(s)). No results found.    Impression and Recommendations:    Assessment and Plan: 69 y.o. male with ***.  PDMP not reviewed this encounter. No orders of the defined types were placed in this encounter.  No orders of the defined types were placed in this encounter.   Discussed warning signs or symptoms. Please see discharge instructions. Patient expresses understanding.   ***

## 2020-05-06 ENCOUNTER — Other Ambulatory Visit: Payer: PPO | Admitting: Orthotics

## 2020-05-07 ENCOUNTER — Ambulatory Visit (INDEPENDENT_AMBULATORY_CARE_PROVIDER_SITE_OTHER): Payer: PPO | Admitting: Family Medicine

## 2020-05-07 ENCOUNTER — Encounter: Payer: Self-pay | Admitting: Family Medicine

## 2020-05-07 ENCOUNTER — Other Ambulatory Visit: Payer: Self-pay

## 2020-05-07 VITALS — BP 120/82 | HR 72 | Ht 66.0 in | Wt 308.0 lb

## 2020-05-07 DIAGNOSIS — G8929 Other chronic pain: Secondary | ICD-10-CM

## 2020-05-07 DIAGNOSIS — M25571 Pain in right ankle and joints of right foot: Secondary | ICD-10-CM

## 2020-05-07 NOTE — Patient Instructions (Signed)
Thank you for coming in today. Plan for PT. Use the over the counter insoles that have good arch support.  I agree with the ankle brace.  If not improving enough in 1 month of PT let me know and I will arrange for MRI.  I will order the MRI and prescribe anti-anxiety meciine for the MRI.    Posterior Tibial Tendinitis Posterior tibial tendinitis is irritation of a tendon called the posterior tibial tendon. Your posterior tibial tendon is a cord-like tissue that connects bones of your lower leg and foot to a muscle that:  Supports your arch.  Helps you raise up on your toes.  Helps you turn your foot down and in. This condition causes foot and ankle pain. It can also lead to a flat foot. What are the causes? This condition is most often caused by repeated stress to the tendon (overuse injury). It can also be caused by a sudden injury that stresses the tendon, such as landing on your foot after jumping or falling. What increases the risk? This condition is more likely to develop in:  People who play a sport that involves putting a lot of pressure on the feet, such as: ? Basketball. ? Tennis. ? Soccer. ? Hockey.  Runners.  Females who are older than 69 years of age and are overweight.  People with diabetes.  People with decreased foot stability.  People with flat feet. What are the signs or symptoms? Symptoms include:  Pain in the inner ankle.  Pain at the arch of your foot.  Pain that gets worse with running, walking, or standing.  Swelling on the inside of your ankle and foot.  Weakness in your ankle or foot.  Inability to stand up on tiptoe.  Flattening of the arch of your foot. How is this diagnosed? This condition may be diagnosed based on:  Your symptoms.  Your medical history.  A physical exam.  Tests, such as: ? X-ray. ? MRI. ? Ultrasound. How is this treated? This condition may be treated by:  Putting ice to the injured area.  Taking  NSAIDs, such as ibuprofen, to reduce pain and swelling.  Wearing a special shoe or shoe insert to support your arch (orthotic).  Having physical therapy.  Replacing high-impact exercise with low-impact exercise, such as swimming or cycling. If your symptoms do not improve with these treatments, you may need to wear a splint, removable walking boot, or short leg cast for 6-8 weeks to keep your foot and ankle still (immobilized). Follow these instructions at home: If you have a cast, splint, or boot:  Keep it clean and dry.  Check the skin around it every day. Tell your health care provider about any concerns. If you have a cast:  Do not stick anything inside it to scratch your skin. Doing that increases your risk of infection.  You may put lotion on dry skin around the edges of the cast. Do not put lotion on the skin underneath the cast. If you have a splint or boot:  Wear it as told by your health care provider. Remove it only as told by your health care provider.  Loosen it if your toes tingle, become numb, or turn cold and blue. Bathing  Do not take baths, swim, or use a hot tub until your health care provider approves. Ask your health care provider if you may take showers.  If your cast, splint, or boot is not waterproof: ? Do not let it get wet. ?  Cover it with a waterproof covering while you take a bath or a shower. Managing pain and swelling   If directed, put ice on the injured area. ? If you have a removable splint or boot, remove it as told by your health care provider. ? Put ice in a plastic bag. ? Place a towel between your skin and the bag or between your cast and the bag. ? Leave the ice on for 20 minutes, 2-3 times a day.  Move your toes often to reduce stiffness and swelling.  Raise (elevate) the injured area above the level of your heart while you are sitting or lying down. Activity  Do not use the injured foot to support your body weight until your health  care provider says that you can. Use crutches as told by your health care provider.  Do not do activities that make pain or swelling worse.  Ask your health care provider when it is safe to drive if you have a cast, splint, or boot on your foot.  Return to your normal activities as told by your health care provider. Ask your health care provider what activities are safe for you.  Do exercises as told by your health care provider. General instructions  Take over-the-counter and prescription medicines only as told by your health care provider.  If you have an orthotic, use it as told by your health care provider.  Keep all follow-up visits as told by your health care provider. This is important. How is this prevented?  Wear footwear that is appropriate to your athletic activity.  Avoid athletic activities that cause pain or swelling in your ankle or foot.  Before being active, do range-of-motion and stretching exercises.  If you develop pain or swelling while training, stop training.  If you have pain or swelling that does not improve after a few days of rest, see your health care provider.  If you start a new athletic activity, start gradually so you can build up your strength and flexibility. Contact a health care provider if:  Your symptoms get worse.  Your symptoms do not improve in 6-8 weeks.  You develop new, unexplained symptoms.  Your splint, boot, or cast gets damaged. Summary  Posterior tibial tendinitis is irritation of a tendon called the posterior tibial tendon.  This condition is most often caused by repeated stress to the tendon (overuse injury).  This condition causes foot pain and ankle pain. It can also lead to a flat foot.  This condition may be treated by not doing high-impact activities, applying ice, having physical therapy, wearing orthotics, and wearing a cast, splint, or boot if needed. This information is not intended to replace advice given to  you by your health care provider. Make sure you discuss any questions you have with your health care provider. Document Revised: 01/29/2019 Document Reviewed: 12/06/2018 Elsevier Patient Education  [place].

## 2020-05-07 NOTE — Progress Notes (Signed)
    Subjective:    CC: R ankle pain and swelling  I, Molly Weber, LAT, ATC, am serving as scribe for Dr. Lynne Leader.  HPI: Pt is a 69 y/o male presenting w/ c/o R ankle pain and swelling. He is currently being followed by podiatry for this condition and has been diagnosed w/ posterior tib tendinitis.  He most recently had a repeat R ankle injection on 04/07/20 and has been wearing a  Tri-Lock ankle brace.  He locates his pain to medial ankle.  He has been seen for this several times by podiatry and has had a total of 3 injections.  This is helped but only temporarily.  He was recommended to have orthotics but has not purchased those yet as he is found those not to be helpful in the past.  He does have an ankle brace which does help.  R ankle swelling: yes Aggravating factors: Treatments tried: multiple ankle injections; Tri-Lock ankle brace  Diagnostic testing: R ankle XR- 01/28/20  Pertinent review of Systems: No fevers or chills  Relevant historical information: Obesity hypertension sleep apnea.   Objective:    Vitals:   05/07/20 1353  BP: 120/82  Pulse: 72  SpO2: 94%   General: Well Developed, well nourished, and in no acute distress.   MSK: Right foot and ankle pes planus.  Slightly swollen medial ankle along course of posterior tibialis tendon.  Tender palpation in this region. Some pain with plantarflexion and inversion. Stable ligamentous exam.  Lab and Radiology Results  X-ray images right ankle obtained January 28, 2020 personally independently reviewed. Minimal degenerative changes.  No acute fractures.   Impression and Recommendations:    Assessment and Plan: 68 y.o. male with right medial ankle and foot pain.  Agree with podiatry assessment of posterior tibialis tendinitis as well as medial ankle DJD and pes planus.  Patient is already had several injections with little lasting benefit.  I think it is reasonable to proceed with compromised arch support using  over-the-counter high-quality insoles instead of expensive orthotics.  Additionally will proceed with trial of physical therapy.  If not better in a month patient will notify me.  At that point I think MRI is totally reasonable.  Recheck following MRI if not better.. Of note patient will need antianxiety medication for claustrophobia with MRI.  MRI should be done in [place].  Orders Placed This Encounter  Procedures  . Ambulatory referral to Physical Therapy    Referral Priority:   Routine    Referral Type:   Physical Medicine    Referral Reason:   Specialty Services Required    Requested Specialty:   Physical Therapy   No orders of the defined types were placed in this encounter.   Discussed warning signs or symptoms. Please see discharge instructions. Patient expresses understanding.   The above documentation has been reviewed and is accurate and complete Lynne Leader, M.D.

## 2020-05-08 DIAGNOSIS — R4189 Other symptoms and signs involving cognitive functions and awareness: Secondary | ICD-10-CM | POA: Diagnosis not present

## 2020-05-08 DIAGNOSIS — E538 Deficiency of other specified B group vitamins: Secondary | ICD-10-CM | POA: Diagnosis not present

## 2020-05-08 DIAGNOSIS — Z87898 Personal history of other specified conditions: Secondary | ICD-10-CM | POA: Diagnosis not present

## 2020-05-08 DIAGNOSIS — Z9989 Dependence on other enabling machines and devices: Secondary | ICD-10-CM | POA: Diagnosis not present

## 2020-05-08 DIAGNOSIS — G4733 Obstructive sleep apnea (adult) (pediatric): Secondary | ICD-10-CM | POA: Diagnosis not present

## 2020-05-15 ENCOUNTER — Encounter: Payer: Self-pay | Admitting: Internal Medicine

## 2020-05-15 ENCOUNTER — Ambulatory Visit (INDEPENDENT_AMBULATORY_CARE_PROVIDER_SITE_OTHER): Payer: PPO | Admitting: Internal Medicine

## 2020-05-15 ENCOUNTER — Other Ambulatory Visit: Payer: Self-pay

## 2020-05-15 ENCOUNTER — Ambulatory Visit
Admission: RE | Admit: 2020-05-15 | Discharge: 2020-05-15 | Disposition: A | Discharge status: Home / Self Care | Payer: PPO | Source: Ambulatory Visit | Attending: Internal Medicine | Admitting: Internal Medicine

## 2020-05-15 VITALS — BP 140/86 | HR 86 | Temp 98.3°F | Ht 66.0 in | Wt 313.4 lb

## 2020-05-15 DIAGNOSIS — R600 Localized edema: Secondary | ICD-10-CM

## 2020-05-15 DIAGNOSIS — I10 Essential (primary) hypertension: Secondary | ICD-10-CM

## 2020-05-15 DIAGNOSIS — I872 Venous insufficiency (chronic) (peripheral): Secondary | ICD-10-CM

## 2020-05-15 DIAGNOSIS — Z6841 Body Mass Index (BMI) 40.0 and over, adult: Secondary | ICD-10-CM | POA: Diagnosis not present

## 2020-05-15 DIAGNOSIS — F429 Obsessive-compulsive disorder, unspecified: Secondary | ICD-10-CM | POA: Diagnosis not present

## 2020-05-15 DIAGNOSIS — M5010 Cervical disc disorder with radiculopathy, unspecified cervical region: Secondary | ICD-10-CM | POA: Diagnosis not present

## 2020-05-15 DIAGNOSIS — R413 Other amnesia: Secondary | ICD-10-CM

## 2020-05-15 DIAGNOSIS — R2242 Localized swelling, mass and lump, left lower limb: Secondary | ICD-10-CM | POA: Diagnosis not present

## 2020-05-15 DIAGNOSIS — R2241 Localized swelling, mass and lump, right lower limb: Secondary | ICD-10-CM | POA: Diagnosis not present

## 2020-05-15 HISTORY — DX: Localized edema: R60.0

## 2020-05-15 MED ORDER — METOPROLOL SUCCINATE ER 25 MG PO TB24: 25.0000 mg | ORAL_TABLET | Freq: Every day | ORAL | 3 refills | Status: DC

## 2020-05-15 MED ORDER — GABAPENTIN 600 MG PO TABS: 600.0000 mg | ORAL_TABLET | Freq: Two times a day (BID) | ORAL | Status: DC

## 2020-05-15 MED ORDER — TRIAMCINOLONE ACETONIDE 0.1 % EX CREA: 1.0000 "application " | TOPICAL_CREAM | Freq: Two times a day (BID) | CUTANEOUS | 0 refills | Status: DC

## 2020-05-15 NOTE — Progress Notes (Signed)
Chief Complaint  Patient presents with  . Hypertension  . Leg Swelling  . Joint Swelling   F/u with wife  1. htn elevated no taking lis hctz 20-12.5 mg not taking 2 per day only 1 and norvasc 5 mg qd today neurology Dr. Sunday Corn advised him to take norvasc 5 mg as BP was running 181/111 he does eat a lot of salt containing foods per his wife   2. Memory loss saw [facility] neurology today Sunday Corn They stopped trazadone 300 mg qhs for sleep and reduced gabapentin 600 tid to bid of note  multifactorial in a patient with obstructive sleep apnea, morbid obesity and alcoholism, need rule out underlying neurodegenerative disease per neurology also could be 2/2 meds  3. C/o b/l leg edema. Not compliant with cpap he is eating a lot of salt high salt diet though wife does not cook with salt he shakes it on his food    Review of Systems  Constitutional: Negative for weight loss.  HENT: Negative for hearing loss.   Eyes: Negative for blurred vision.  Respiratory: Negative for shortness of breath.   Cardiovascular: Positive for leg swelling. Negative for chest pain.  Musculoskeletal: Negative for falls.  Skin: Negative for rash.  Psychiatric/Behavioral: Positive for memory loss.   Past Medical History:  Diagnosis Date  . Adenomatous colon polyp    tubular  . Adenomatous rectal polyp    tubular  . Asthma   . Depression   . Diverticulosis   . GERD (gastroesophageal reflux disease)   . History of chicken pox   . Hyperlipidemia   . Hypertension   . Lung nodule    CT 10/2014  . OSA (obstructive sleep apnea)   . Sleep apnea    wears cpap  . Vitamin D deficiency    Past Surgical History:  Procedure Laterality Date  . COLONOSCOPY    . COLONOSCOPY W/ POLYPECTOMY  2015   [facility], benign  . HERNIA REPAIR     inguinal  . POLYPECTOMY    . TONSILLECTOMY     age 70    Family History  Problem Relation Age of Onset  . Depression Mother   . Hypertension Father   . Heart disease Father   . Stroke Father    . Cancer Maternal Grandmother        ovarian?  . Cancer Paternal Grandfather        prostate  . Prostate cancer Paternal Grandfather   . Colon cancer Neg Hx   . Colon polyps Neg Hx    Social History   Socioeconomic History  . Marital status: Married    Spouse name: Not on file  . Number of children: Not on file  . Years of education: Not on file  . Highest education level: Not on file  Occupational History  . Not on file  Tobacco Use  . Smoking status: Never Smoker  . Smokeless tobacco: Never Used  Vaping Use  . Vaping Use: Never used  Substance and Sexual Activity  . Alcohol use: Yes    Alcohol/week: 0.0 standard drinks    Comment: 2-3 beers daily  . Drug use: No  . Sexual activity: Never  Other Topics Concern  . Not on file  Social History Narrative   Lives in [place] with wife, [name] 23 years as of 10/2018. 1 daughter in [place].      Work - Optician, dispensing, now Animal nutritionist      From [employer]  Social Determinants of Health   Financial Resource Strain:   . Difficulty of Paying Living Expenses:   Food Insecurity:   . Worried About Charity fundraiser in the Last Year:   . Arboriculturist in the Last Year:   Transportation Needs: No Transportation Needs  . Lack of Transportation (Medical): No  . Lack of Transportation (Non-Medical): No  Physical Activity:   . Days of Exercise per Week:   . Minutes of Exercise per Session:   Stress: No Stress Concern Present  . Feeling of Stress : Not at all  Social Connections: Moderately Isolated  . Frequency of Communication with Friends and Family: More than three times a week  . Frequency of Social Gatherings with Friends and Family: Never  . Attends Religious Services: Never  . Active Member of Clubs or Organizations: No  . Attends Archivist Meetings: Never  . Marital Status: Married  Human resources officer Violence: Not At Risk  . Fear of Current or Ex-Partner: No  . Emotionally Abused: No   . Physically Abused: No  . Sexually Abused: No   Current Meds  Medication Sig  . amLODipine (NORVASC) 5 MG tablet Take 1 tablet (5 mg total) by mouth daily.  . Cholecalciferol 50 MCG (2000 UT) CAPS Take 2,000 Units by mouth.  . diclofenac Sodium (VOLTAREN) 1 % GEL Apply 2-4 g topically 4 (four) times daily.  Marland Kitchen dicyclomine (BENTYL) 10 MG capsule Take 1 capsule (10 mg total) by mouth 2 (two) times daily.  Marland Kitchen ezetimibe (ZETIA) 10 MG tablet Take 1 tablet (10 mg total) by mouth daily.  . famotidine (PEPCID) 20 MG tablet Take 1 tablet (20 mg total) by mouth daily.  Marland Kitchen gabapentin (NEURONTIN) 600 MG tablet Take 1 tablet (600 mg total) by mouth 2 (two) times daily. TAKE 1 TABLET(600 MG) BY MOUTH TWO TIMES DAILY  . lisinopril-hydrochlorothiazide (ZESTORETIC) 20-12.5 MG tablet Take 2 tablets by mouth daily.  Marland Kitchen LORazepam (ATIVAN) 0.5 MG tablet Take 1-2 tablets (0.5-1 mg total) by mouth at bedtime as needed for anxiety.  Marland Kitchen nystatin cream (MYCOSTATIN) Apply 1 application topically 2 (two) times daily.  Marland Kitchen rOPINIRole (REQUIP) 2 MG tablet Take 3 tablets (6 mg total) by mouth at bedtime.  . sertraline (ZOLOFT) 50 MG tablet Take 1 tablet (50 mg total) by mouth daily.  . [DISCONTINUED] gabapentin (NEURONTIN) 600 MG tablet TAKE 1 TABLET(600 MG) BY MOUTH THREE TIMES DAILY (Patient taking differently: Take 600 mg by mouth 2 (two) times daily. TAKE 1 TABLET(600 MG) BY MOUTH TWO TIMES DAILY)   Allergies  Allergen Reactions  . Nsaids Other (See Comments)    GI Issues   No results found for this or any previous visit (from the past 2160 hour(s)). Objective  Body mass index is 50.58 kg/m. Wt Readings from Last 3 Encounters:  05/15/20 (!) 313 lb 6.4 oz (142.2 kg)  05/07/20 (!) 308 lb (139.7 kg)  04/23/20 (!) 315 lb (142.9 kg)   Temp Readings from Last 3 Encounters:  05/15/20 98.3 F (36.8 C) (Oral)  01/23/20 99 F (37.2 C) (Temporal)  01/21/20 98.6 F (37 C) (Temporal)   BP Readings from Last 3  Encounters:  05/15/20 (!) 140/86  05/07/20 120/82  01/23/20 140/76   Pulse Readings from Last 3 Encounters:  05/15/20 86  05/07/20 72  01/23/20 68    Physical Exam Vitals and nursing note reviewed.  Constitutional:      Appearance: Normal appearance. He is well-developed. He is  morbidly obese.  HENT:     Head: Normocephalic and atraumatic.  Eyes:     Conjunctiva/sclera: Conjunctivae normal.     Pupils: Pupils are equal, round, and reactive to light.  Cardiovascular:     Rate and Rhythm: Normal rate and regular rhythm.     Heart sounds: Normal heart sounds. No murmur heard.   Pulmonary:     Effort: Pulmonary effort is normal.     Breath sounds: Normal breath sounds.  Musculoskeletal:     Right lower leg: 2+ Edema present.     Left lower leg: 2+ Edema present.  Skin:    General: Skin is warm and dry.     Comments: Stasis derm to legs R>L  Neurological:     General: No focal deficit present.     Mental Status: He is alert and oriented to person, place, and time. Mental status is at baseline.     Gait: Gait normal.  Psychiatric:        Attention and Perception: Attention and perception normal.        Mood and Affect: Mood and affect normal.        Speech: Speech normal.        Behavior: Behavior normal. Behavior is cooperative.        Thought Content: Thought content normal.        Cognition and Memory: Cognition and memory normal.        Judgment: Judgment normal.     Assessment  Plan  Bilateral leg edema - Plan: US Venous Img Lower Bilateral r/o DVT other ddx could be due to high salt diet reduce salt, check pro BNP r/o cardiac etiology, could be meds I.e gabapentin 600 tid dose reduced to bid   Comprehensive metabolic panel, NT-proBNP rec use cpap  Essential hypertension - Plan: metoprolol succinate (TOPROL-XL) 25 MG 24 hr tablet, Comprehensive metabolic panel, CBC with Differential/Platelet, NT-proBNP Cont norvasc 5 mg qhs and lis-hctz 20-12.5 2 pills in the am    Venous stasis dermatitis of right lower extremity - Plan: triamcinolone cream (KENALOG) 0.1 %  Cervical disc disorder with radiculopathy of cervical region - Plan: gabapentin (NEURONTIN) 600 MG tablet bid  Memory loss Thought 2/2 alcoholism, meds, r/o neurocog rec use CPAP  Morbid obesity with BMI of 50.0-59.9, adult (HCC)  Rec healthy diet and exercise   HM HM Flu shot utd tdap utd, prevnar, pna 23  Consider shingrix in future covid 19 vx had 2/2 per pt unknown date need to get copy of card   Never smoker Nl PSA1.04 07/09/19 Colonoscopy h/o multiple polyps due 04/12/2019 [facility] -pt wants to holdas of 10/17/2019 and 01/21/20  Consider derm in future notneeded now  MMR immune and hep C neg rec healthy diet and exercise Consider dermatology referral for isks trunk and Sks and skin cancer screening in future pt will let me know if wants in future   Provider: Dr. [name] McLean-Scocuzza-Internal Medicine

## 2020-05-15 NOTE — Patient Instructions (Signed)
Goal <130/<80   Edema  Edema is when you have too much fluid in your body or under your skin. Edema may make your legs, feet, and ankles swell up. Swelling is also common in looser tissues, like around your eyes. This is a common condition. It gets more common as you get older. There are many possible causes of edema. Eating too much salt (sodium) and being on your feet or sitting for a long time can cause edema in your legs, feet, and ankles. Hot weather may make edema worse. Edema is usually painless. Your skin may look swollen or shiny. Follow these instructions at home:  Keep the swollen body part raised (elevated) above the level of your heart when you are sitting or lying down.  Do not sit still or stand for a long time.  Do not wear tight clothes. Do not wear garters on your upper legs.  Exercise your legs. This can help the swelling go down.  Wear elastic bandages or support stockings as told by your doctor.  Eat a low-salt (low-sodium) diet to reduce fluid as told by your doctor.  Depending on the cause of your swelling, you may need to limit how much fluid you drink (fluid restriction).  Take over-the-counter and prescription medicines only as told by your doctor. Contact a doctor if:  Treatment is not working.  You have heart, liver, or kidney disease and have symptoms of edema.  You have sudden and unexplained weight gain. Get help right away if:  You have shortness of breath or chest pain.  You cannot breathe when you lie down.  You have pain, redness, or warmth in the swollen areas.  You have heart, liver, or kidney disease and get edema all of a sudden.  You have a fever and your symptoms get worse all of a sudden. Summary  Edema is when you have too much fluid in your body or under your skin.  Edema may make your legs, feet, and ankles swell up. Swelling is also common in looser tissues, like around your eyes.  Raise (elevate) the swollen body part above  the level of your heart when you are sitting or lying down.  Follow your doctor's instructions about diet and how much fluid you can drink (fluid restriction). This information is not intended to replace advice given to you by your health care provider. Make sure you discuss any questions you have with your health care provider. Document Revised: 10/06/2017 Document Reviewed: 10/21/2016 Elsevier Patient Education  [place].  Hypertension, Adult High blood pressure (hypertension) is when the force of blood pumping through the arteries is too strong. The arteries are the blood vessels that carry blood from the heart throughout the body. Hypertension forces the heart to work harder to pump blood and may cause arteries to become narrow or stiff. Untreated or uncontrolled hypertension can cause a heart attack, heart failure, a stroke, kidney disease, and other problems. A blood pressure reading consists of a higher number over a lower number. Ideally, your blood pressure should be below 120/80. The first ("top") number is called the systolic pressure. It is a measure of the pressure in your arteries as your heart beats. The second ("bottom") number is called the diastolic pressure. It is a measure of the pressure in your arteries as the heart relaxes. What are the causes? The exact cause of this condition is not known. There are some conditions that result in or are related to high blood pressure.  What increases the risk? Some risk factors for high blood pressure are under your control. The following factors may make you more likely to develop this condition:  Smoking.  Having type 2 diabetes mellitus, high cholesterol, or both.  Not getting enough exercise or physical activity.  Being overweight.  Having too much fat, sugar, calories, or salt (sodium) in your diet.  Drinking too much alcohol. Some risk factors for high blood pressure may be difficult or impossible to change. Some of  these factors include:  Having chronic kidney disease.  Having a family history of high blood pressure.  Age. Risk increases with age.  Race. You may be at higher risk if you are African American.  Gender. Men are at higher risk than women before age 11. After age 76, women are at higher risk than men.  Having obstructive sleep apnea.  Stress. What are the signs or symptoms? High blood pressure may not cause symptoms. Very high blood pressure (hypertensive crisis) may cause:  Headache.  Anxiety.  Shortness of breath.  Nosebleed.  Nausea and vomiting.  Vision changes.  Severe chest pain.  Seizures. How is this diagnosed? This condition is diagnosed by measuring your blood pressure while you are seated, with your arm resting on a flat surface, your legs uncrossed, and your feet flat on the floor. The cuff of the blood pressure monitor will be placed directly against the skin of your upper arm at the level of your heart. It should be measured at least twice using the same arm. Certain conditions can cause a difference in blood pressure between your right and left arms. Certain factors can cause blood pressure readings to be lower or higher than normal for a short period of time:  When your blood pressure is higher when you are in a health care provider's office than when you are at home, this is called white coat hypertension. Most people with this condition do not need medicines.  When your blood pressure is higher at home than when you are in a health care provider's office, this is called masked hypertension. Most people with this condition may need medicines to control blood pressure. If you have a high blood pressure reading during one visit or you have normal blood pressure with other risk factors, you may be asked to:  Return on a different day to have your blood pressure checked again.  Monitor your blood pressure at home for 1 week or longer. If you are diagnosed  with hypertension, you may have other blood or imaging tests to help your health care provider understand your overall risk for other conditions. How is this treated? This condition is treated by making healthy lifestyle changes, such as eating healthy foods, exercising more, and reducing your alcohol intake. Your health care provider may prescribe medicine if lifestyle changes are not enough to get your blood pressure under control, and if:  Your systolic blood pressure is above 130.  Your diastolic blood pressure is above 80. Your personal target blood pressure may vary depending on your medical conditions, your age, and other factors. Follow these instructions at home: Eating and drinking   Eat a diet that is high in fiber and potassium, and low in sodium, added sugar, and fat. An example eating plan is called the DASH (Dietary Approaches to Stop Hypertension) diet. To eat this way: ? Eat plenty of fresh fruits and vegetables. Try to fill one half of your plate at each meal with fruits and vegetables. ?  Eat whole grains, such as whole-wheat pasta, brown rice, or whole-grain bread. Fill about one fourth of your plate with whole grains. ? Eat or drink low-fat dairy products, such as skim milk or low-fat yogurt. ? Avoid fatty cuts of meat, processed or cured meats, and poultry with skin. Fill about one fourth of your plate with lean proteins, such as fish, chicken without skin, beans, eggs, or tofu. ? Avoid pre-made and processed foods. These tend to be higher in sodium, added sugar, and fat.  Reduce your daily sodium intake. Most people with hypertension should eat less than 1,500 mg of sodium a day.  Do not drink alcohol if: ? Your health care provider tells you not to drink. ? You are pregnant, may be pregnant, or are planning to become pregnant.  If you drink alcohol: ? Limit how much you use to:  0-1 drink a day for women.  0-2 drinks a day for men. ? Be aware of how much alcohol  is in your drink. In the [place] one drink equals one 12 oz bottle of beer (355 mL), one 5 oz glass of wine (148 mL), or one 1 oz glass of hard liquor (44 mL). Lifestyle   Work with your health care provider to maintain a healthy body weight or to lose weight. Ask what an ideal weight is for you.  Get at least 30 minutes of exercise most days of the week. Activities may include walking, swimming, or biking.  Include exercise to strengthen your muscles (resistance exercise), such as Pilates or lifting weights, as part of your weekly exercise routine. Try to do these types of exercises for 30 minutes at least 3 days a week.  Do not use any products that contain nicotine or tobacco, such as cigarettes, e-cigarettes, and chewing tobacco. If you need help quitting, ask your health care provider.  Monitor your blood pressure at home as told by your health care provider.  Keep all follow-up visits as told by your health care provider. This is important. Medicines  Take over-the-counter and prescription medicines only as told by your health care provider. Follow directions carefully. Blood pressure medicines must be taken as prescribed.  Do not skip doses of blood pressure medicine. Doing this puts you at risk for problems and can make the medicine less effective.  Ask your health care provider about side effects or reactions to medicines that you should watch for. Contact a health care provider if you:  Think you are having a reaction to a medicine you are taking.  Have headaches that keep coming back (recurring).  Feel dizzy.  Have swelling in your ankles.  Have trouble with your vision. Get help right away if you:  Develop a severe headache or confusion.  Have unusual weakness or numbness.  Feel faint.  Have severe pain in your chest or abdomen.  Vomit repeatedly.  Have trouble breathing. Summary  Hypertension is when the force of blood pumping through your arteries is too  strong. If this condition is not controlled, it may put you at risk for serious complications.  Your personal target blood pressure may vary depending on your medical conditions, your age, and other factors. For most people, a normal blood pressure is less than 120/80.  Hypertension is treated with lifestyle changes, medicines, or a combination of both. Lifestyle changes include losing weight, eating a healthy, low-sodium diet, exercising more, and limiting alcohol. This information is not intended to replace advice given to you by your health  care provider. Make sure you discuss any questions you have with your health care provider. Document Revised: 06/13/2018 Document Reviewed: 06/13/2018 Elsevier Patient Education  [place] DASH stands for "Dietary Approaches to Stop Hypertension." The DASH eating plan is a healthy eating plan that has been shown to reduce high blood pressure (hypertension). It may also reduce your risk for type 2 diabetes, heart disease, and stroke. The DASH eating plan may also help with weight loss. What are tips for following this plan?  General guidelines  Avoid eating more than 2,300 mg (milligrams) of salt (sodium) a day. If you have hypertension, you may need to reduce your sodium intake to 1,500 mg a day.  Limit alcohol intake to no more than 1 drink a day for nonpregnant women and 2 drinks a day for men. One drink equals 12 oz of beer, 5 oz of wine, or 1 oz of hard liquor.  Work with your health care provider to maintain a healthy body weight or to lose weight. Ask what an ideal weight is for you.  Get at least 30 minutes of exercise that causes your heart to beat faster (aerobic exercise) most days of the week. Activities may include walking, swimming, or biking.  Work with your health care provider or diet and nutrition specialist (dietitian) to adjust your eating plan to your individual calorie needs. Reading food  labels   Check food labels for the amount of sodium per serving. Choose foods with less than 5 percent of the Daily Value of sodium. Generally, foods with less than 300 mg of sodium per serving fit into this eating plan.  To find whole grains, look for the word "whole" as the first word in the ingredient list. Shopping  Buy products labeled as "low-sodium" or "no salt added."  Buy fresh foods. Avoid canned foods and premade or frozen meals. Cooking  Avoid adding salt when cooking. Use salt-free seasonings or herbs instead of table salt or sea salt. Check with your health care provider or pharmacist before using salt substitutes.  Do not fry foods. Cook foods using healthy methods such as baking, boiling, grilling, and broiling instead.  Cook with heart-healthy oils, such as olive, canola, soybean, or sunflower oil. Meal planning  Eat a balanced diet that includes: ? 5 or more servings of fruits and vegetables each day. At each meal, try to fill half of your plate with fruits and vegetables. ? Up to 6-8 servings of whole grains each day. ? Less than 6 oz of lean meat, poultry, or fish each day. A 3-oz serving of meat is about the same size as a deck of cards. One egg equals 1 oz. ? 2 servings of low-fat dairy each day. ? A serving of nuts, seeds, or beans 5 times each week. ? Heart-healthy fats. Healthy fats called Omega-3 fatty acids are found in foods such as flaxseeds and coldwater fish, like sardines, salmon, and mackerel.  Limit how much you eat of the following: ? Canned or prepackaged foods. ? Food that is high in trans fat, such as fried foods. ? Food that is high in saturated fat, such as fatty meat. ? Sweets, desserts, sugary drinks, and other foods with added sugar. ? Full-fat dairy products.  Do not salt foods before eating.  Try to eat at least 2 vegetarian meals each week.  Eat more home-cooked food and less restaurant, buffet, and fast food.  When eating at a  restaurant, ask that  your food be prepared with less salt or no salt, if possible. What foods are recommended? The items listed may not be a complete list. Talk with your dietitian about what dietary choices are best for you. Grains Whole-grain or whole-wheat bread. Whole-grain or whole-wheat pasta. Brown rice. [place]. Bulgur. Whole-grain and low-sodium cereals. Pita bread. Low-fat, low-sodium crackers. Whole-wheat flour tortillas. Vegetables Fresh or frozen vegetables (raw, steamed, roasted, or grilled). Low-sodium or reduced-sodium tomato and vegetable juice. Low-sodium or reduced-sodium tomato sauce and tomato paste. Low-sodium or reduced-sodium canned vegetables. Fruits All fresh, dried, or frozen fruit. Canned fruit in natural juice (without added sugar). Meat and other protein foods Skinless chicken or [place]. Ground chicken or [place]. Pork with fat trimmed off. Fish and seafood. Egg whites. Dried beans, peas, or lentils. Unsalted nuts, nut butters, and seeds. Unsalted canned beans. Lean cuts of beef with fat trimmed off. Low-sodium, lean deli meat. Dairy Low-fat (1%) or fat-free (skim) milk. Fat-free, low-fat, or reduced-fat cheeses. Nonfat, low-sodium ricotta or cottage cheese. Low-fat or nonfat yogurt. Low-fat, low-sodium cheese. Fats and oils Soft margarine without trans fats. Vegetable oil. Low-fat, reduced-fat, or light mayonnaise and salad dressings (reduced-sodium). Canola, safflower, olive, soybean, and sunflower oils. Avocado. Seasoning and other foods Herbs. Spices. Seasoning mixes without salt. Unsalted popcorn and pretzels. Fat-free sweets. What foods are not recommended? The items listed may not be a complete list. Talk with your dietitian about what dietary choices are best for you. Grains Baked goods made with fat, such as croissants, muffins, or some breads. Dry pasta or rice meal packs. Vegetables Creamed or fried vegetables. Vegetables in a cheese sauce.  Regular canned vegetables (not low-sodium or reduced-sodium). Regular canned tomato sauce and paste (not low-sodium or reduced-sodium). Regular tomato and vegetable juice (not low-sodium or reduced-sodium). Angie Fava. Olives. Fruits Canned fruit in a light or heavy syrup. Fried fruit. Fruit in cream or butter sauce. Meat and other protein foods Fatty cuts of meat. Ribs. Fried meat. Berniece Salines. Sausage. Bologna and other processed lunch meats. Salami. Fatback. Hotdogs. Bratwurst. Salted nuts and seeds. Canned beans with added salt. Canned or smoked fish. Whole eggs or egg yolks. Chicken or [place] with skin. Dairy Whole or 2% milk, cream, and half-and-half. Whole or full-fat cream cheese. Whole-fat or sweetened yogurt. Full-fat cheese. Nondairy creamers. Whipped toppings. Processed cheese and cheese spreads. Fats and oils Butter. Stick margarine. Lard. Shortening. Ghee. Bacon fat. Tropical oils, such as coconut, palm kernel, or palm oil. Seasoning and other foods Salted popcorn and pretzels. Onion salt, garlic salt, seasoned salt, table salt, and sea salt. Worcestershire sauce. Tartar sauce. Barbecue sauce. Teriyaki sauce. Soy sauce, including reduced-sodium. Steak sauce. Canned and packaged gravies. Fish sauce. Oyster sauce. Cocktail sauce. Horseradish that you find on the shelf. Ketchup. Mustard. Meat flavorings and tenderizers. Bouillon cubes. Hot sauce and Tabasco sauce. Premade or packaged marinades. Premade or packaged taco seasonings. Relishes. Regular salad dressings. Where to find more information:  National Heart, Lung, and [place]: https://wilson-eaton.com/  American Heart Association: www.heart.org Summary  The DASH eating plan is a healthy eating plan that has been shown to reduce high blood pressure (hypertension). It may also reduce your risk for type 2 diabetes, heart disease, and stroke.  With the DASH eating plan, you should limit salt (sodium) intake to 2,300 mg a day. If you have  hypertension, you may need to reduce your sodium intake to 1,500 mg a day.  When on the DASH eating plan, aim to eat more fresh fruits and  vegetables, whole grains, lean proteins, low-fat dairy, and heart-healthy fats.  Work with your health care provider or diet and nutrition specialist (dietitian) to adjust your eating plan to your individual calorie needs. This information is not intended to replace advice given to you by your health care provider. Make sure you discuss any questions you have with your health care provider. Document Revised: 09/15/2017 Document Reviewed: 09/26/2016 Elsevier Patient Education  2020 [employer].

## 2020-05-15 NOTE — Progress Notes (Signed)
Patient presenting with elevated blood pressure and swelling in the right leg/ankle. Patient states his leg has also been turning red but states there is no pain. Swelling originated in the right ankle and travels up into the right leg. Patient is concerned about blood clots. States he has not been checked for these before.   States his neurologists dropped his gabapentin to 600 mg and had him stop taking the trazodone. When this did not help he was told to be seen by PCP or ED.   States that he also will have on and off pain coming down his left sided forehead.

## 2020-05-18 ENCOUNTER — Telehealth: Payer: Self-pay | Admitting: Internal Medicine

## 2020-05-18 ENCOUNTER — Other Ambulatory Visit: Payer: Self-pay

## 2020-05-18 ENCOUNTER — Telehealth: Payer: Self-pay

## 2020-05-18 DIAGNOSIS — M25571 Pain in right ankle and joints of right foot: Secondary | ICD-10-CM

## 2020-05-18 DIAGNOSIS — G8929 Other chronic pain: Secondary | ICD-10-CM

## 2020-05-18 DIAGNOSIS — I10 Essential (primary) hypertension: Secondary | ICD-10-CM

## 2020-05-18 LAB — COMPREHENSIVE METABOLIC PANEL
AG Ratio: 1.9 (calc) (ref 1.0–2.5)
ALT: 52 U/L — ABNORMAL HIGH (ref 9–46)
AST: 42 U/L — ABNORMAL HIGH (ref 10–35)
Albumin: 4.3 g/dL (ref 3.6–5.1)
Alkaline phosphatase (APISO): 76 U/L (ref 35–144)
BUN: 16 mg/dL (ref 7–25)
CO2: 29 mmol/L (ref 20–32)
Calcium: 8.6 mg/dL (ref 8.6–10.3)
Chloride: 102 mmol/L (ref 98–110)
Creat: 0.98 mg/dL (ref 0.70–1.25)
Globulin: 2.3 g/dL (calc) (ref 1.9–3.7)
Glucose, Bld: 96 mg/dL (ref 65–99)
Potassium: 3.9 mmol/L (ref 3.5–5.3)
Sodium: 140 mmol/L (ref 135–146)
Total Bilirubin: 0.7 mg/dL (ref 0.2–1.2)
Total Protein: 6.6 g/dL (ref 6.1–8.1)

## 2020-05-18 LAB — CBC WITH DIFFERENTIAL/PLATELET
Absolute Monocytes: 827 cells/uL (ref 200–950)
Basophils Absolute: 32 cells/uL (ref 0–200)
Basophils Relative: 0.3 %
Eosinophils Absolute: 127 cells/uL (ref 15–500)
Eosinophils Relative: 1.2 %
HCT: 47.2 % (ref 38.5–50.0)
Hemoglobin: 16 g/dL (ref 13.2–17.1)
Lymphs Abs: 2364 cells/uL (ref 850–3900)
MCH: 30.6 pg (ref 27.0–33.0)
MCHC: 33.9 g/dL (ref 32.0–36.0)
MCV: 90.2 fL (ref 80.0–100.0)
MPV: 10.1 fL (ref 7.5–12.5)
Monocytes Relative: 7.8 %
Neutro Abs: 7250 cells/uL (ref 1500–7800)
Neutrophils Relative %: 68.4 %
Platelets: 185 10*3/uL (ref 140–400)
RBC: 5.23 10*6/uL (ref 4.20–5.80)
RDW: 12.9 % (ref 11.0–15.0)
Total Lymphocyte: 22.3 %
WBC: 10.6 10*3/uL (ref 3.8–10.8)

## 2020-05-18 LAB — NT-PROBNP: Pro B Natriuretic peptide (BNP): 13 pg/mL

## 2020-05-18 MED ORDER — AMLODIPINE BESYLATE 5 MG PO TABS: 5.0000 mg | ORAL_TABLET | Freq: Every day | ORAL | 3 refills | Status: DC

## 2020-05-18 MED ORDER — LISINOPRIL-HYDROCHLOROTHIAZIDE 20-12.5 MG PO TABS: 2.0000 | ORAL_TABLET | Freq: Every day | ORAL | 3 refills | Status: DC

## 2020-05-18 MED ORDER — METOPROLOL SUCCINATE ER 25 MG PO TB24: 25.0000 mg | ORAL_TABLET | Freq: Every day | ORAL | 3 refills | Status: DC

## 2020-05-18 NOTE — Telephone Encounter (Signed)
Spoken to wife. Patient hasn't been taking his BP medications and had to send in another refill for BP medication. Wife was unsure of what patient was taking. She stated his memory is getting worse and can not recall what is taking for high BP. Patient is not having sx of blurry vision, headaches, arm px,jaw px,  palpitations, chest pain, SOB,  and dizziness. Patient had a headaches yesterday and BP was 157-167/87-97. Patient wife is going to go pick up patients medication and make sure husband is taking.

## 2020-05-18 NOTE — Telephone Encounter (Signed)
Pt wife called and has a question about a new medication that he was put on Also said that he is having headaches and his Bp was running high. But didn't give a reading

## 2020-05-18 NOTE — Telephone Encounter (Signed)
Patient called states he wants to be referred to [facility] [place]  [phone]

## 2020-05-18 NOTE — Telephone Encounter (Signed)
Paul Cook should be on   2 pills of lisinopril-hctz pills daily  Norvasc 5 mg he can take this in the am if his blood pressure running high during the day  Metoprolol xl 25 at night   They need to let me know by the end of the week his blood pressure readings please   Inform wife I wrote out on his AVS ALL of medication and what they are for and gave to her at the appt    [place]

## 2020-05-19 ENCOUNTER — Ambulatory Visit: Payer: PPO | Admitting: Podiatry

## 2020-05-19 NOTE — Telephone Encounter (Signed)
Referral placed.

## 2020-05-20 NOTE — Telephone Encounter (Signed)
Patient informed and verbalized understanding.  States he will call back in Friday or Monday to report his readings

## 2020-05-21 ENCOUNTER — Telehealth: Payer: Self-pay | Admitting: Internal Medicine

## 2020-05-21 NOTE — Telephone Encounter (Signed)
Patient informed and verbalized understanding.  See result note. °

## 2020-05-21 NOTE — Telephone Encounter (Signed)
Pt called about lab results. °

## 2020-05-25 ENCOUNTER — Telehealth: Payer: Self-pay | Admitting: Internal Medicine

## 2020-05-25 ENCOUNTER — Other Ambulatory Visit: Payer: Self-pay | Admitting: Internal Medicine

## 2020-05-25 DIAGNOSIS — R748 Abnormal levels of other serum enzymes: Secondary | ICD-10-CM

## 2020-05-25 NOTE — Telephone Encounter (Signed)
Pt calling b/p reading: for 5 days 157/92   168/97,166/99 152/86,156/92 ,8-3 164/97, 144/92,112/72,8-6 128/74,113/65,8-7 122/78,131/75 8-8 167/61,144/80 8-9 138/82

## 2020-05-26 ENCOUNTER — Telehealth: Payer: Self-pay | Admitting: Internal Medicine

## 2020-05-26 NOTE — Telephone Encounter (Signed)
Inform wife  BP seems to be improving on more recent days  Continue to monitor and let me know next week goal <130/<80  What is he doing on days BP running high like on 8/8?  Did he miss doses Wait 2 hours after medication before checking BP please or check if has h/a  or dizziness  Inform wife

## 2020-05-26 NOTE — Telephone Encounter (Signed)
Patient and wife informed. Patient had not been taking the Lisinopril-HCTZ for a few days as he could not find the pills.  Will continue Lisinopril-HCTZ 2 pills and amlodipine in the morning, Metoprolol at night.   Patient has not been having any headaches or dizziness. Will continue to monitor BP and will call in with highs and symptoms.

## 2020-05-26 NOTE — Telephone Encounter (Signed)
Chrismon, Ashley L 38 minutes ago (1:05 PM)  AC   Pt wife called and had a couple question about his medication    Wants to know if he still needs to take the lisinopril-hydrochlorothiazide (ZESTORETIC) 20-12.5 MG tablet   Also wants to know when he needs to take the amLODipine (NORVASC) 5 MG tablet

## 2020-05-26 NOTE — Telephone Encounter (Signed)
Added to previous telephone encounter

## 2020-05-26 NOTE — Telephone Encounter (Signed)
Pt wife called and had a couple question about his medication   Wants to know if he still needs to take the lisinopril-hydrochlorothiazide (ZESTORETIC) 20-12.5 MG tablet  Also wants to know when he needs to take the amLODipine (NORVASC) 5 MG tablet

## 2020-05-28 ENCOUNTER — Ambulatory Visit
Admission: RE | Admit: 2020-05-28 | Discharge: 2020-05-28 | Disposition: A | Discharge status: Home / Self Care | Payer: PPO | Source: Ambulatory Visit | Attending: Internal Medicine | Admitting: Internal Medicine

## 2020-05-28 ENCOUNTER — Other Ambulatory Visit: Payer: Self-pay

## 2020-05-28 DIAGNOSIS — N1330 Unspecified hydronephrosis: Secondary | ICD-10-CM | POA: Diagnosis not present

## 2020-05-28 DIAGNOSIS — R748 Abnormal levels of other serum enzymes: Secondary | ICD-10-CM | POA: Diagnosis not present

## 2020-05-28 DIAGNOSIS — K8020 Calculus of gallbladder without cholecystitis without obstruction: Secondary | ICD-10-CM | POA: Diagnosis not present

## 2020-05-29 ENCOUNTER — Telehealth: Payer: Self-pay | Admitting: Internal Medicine

## 2020-05-29 ENCOUNTER — Encounter: Payer: Self-pay | Admitting: Internal Medicine

## 2020-05-29 ENCOUNTER — Other Ambulatory Visit: Payer: Self-pay | Admitting: Internal Medicine

## 2020-05-29 ENCOUNTER — Ambulatory Visit: Admission: RE | Admit: 2020-05-29 | Payer: PPO | Source: Ambulatory Visit

## 2020-05-29 DIAGNOSIS — R911 Solitary pulmonary nodule: Secondary | ICD-10-CM

## 2020-05-29 DIAGNOSIS — K760 Fatty (change of) liver, not elsewhere classified: Secondary | ICD-10-CM | POA: Insufficient documentation

## 2020-05-29 DIAGNOSIS — K8020 Calculus of gallbladder without cholecystitis without obstruction: Secondary | ICD-10-CM | POA: Insufficient documentation

## 2020-05-29 DIAGNOSIS — N281 Cyst of kidney, acquired: Secondary | ICD-10-CM

## 2020-05-29 DIAGNOSIS — R748 Abnormal levels of other serum enzymes: Secondary | ICD-10-CM

## 2020-05-29 HISTORY — DX: Calculus of gallbladder without cholecystitis without obstruction: K80.20

## 2020-05-29 NOTE — Telephone Encounter (Signed)
Per pt he wants something to relax him when he gets the MRI. Please advise and thank you!

## 2020-06-01 NOTE — Telephone Encounter (Signed)
Please advise 

## 2020-06-01 NOTE — Telephone Encounter (Signed)
US abdomen needs further work up incidental finding below and needs MRI abdomen with and without contrast dye   Left Kidney: Complicated cyst at inferior pole 5.7 x 3.3 x 4.1 cm, containing slightly irregular wall and probable Septation/irregularity   This has been present since at least 11/13/2014 but has slightly increased in size   Also found +Fatty infiltration of liver with small cysts in LEFT lobe liver.   Cholelithiasis=gallstones.   Complicated cyst at inferior pole of LEFT kidney 5.7 cm in greatest size, increased in size since 11/13/2014  --->This is why MRI imaging with and without contrast recommended.  Do they want this ordered?  Discuss with wife

## 2020-06-01 NOTE — Telephone Encounter (Signed)
Pt wife would like to get more information before scheduling the MRI wife states it was not discussed. So she wants to be more confident before getting them done due to MRI being expensive. Please advise and Thank you!  Call wife at [phone].

## 2020-06-02 NOTE — Telephone Encounter (Signed)
Patient and wife informed and verbalized understanding.   They are agreeable to additional imaging. Patient is claustrophobic. States he would like to have an open MRI but he will still need something to take for nerves beforehand.   Please advise

## 2020-06-04 NOTE — Telephone Encounter (Signed)
Pt wife called to follow up on MRI. Wife okay 'd the MRI. I will schedule pt a t [facility]

## 2020-06-04 NOTE — Telephone Encounter (Signed)
Pt wife is requesting pt have medication to calm him down for the MRI. Pt appt is on 08/30 @ 1:45pm [facility]. Please advise and Thank you!

## 2020-06-07 ENCOUNTER — Other Ambulatory Visit: Payer: Self-pay | Admitting: Internal Medicine

## 2020-06-07 DIAGNOSIS — F419 Anxiety disorder, unspecified: Secondary | ICD-10-CM

## 2020-06-07 MED ORDER — DIAZEPAM 5 MG PO TABS: ORAL_TABLET | ORAL | 0 refills | Status: DC

## 2020-06-07 NOTE — Telephone Encounter (Signed)
Sent valium 2 pills to [facility] 1 pill 15 min before MRI then another upon getting MRI   Inform wife

## 2020-06-08 ENCOUNTER — Ambulatory Visit
Admission: RE | Admit: 2020-06-08 | Discharge: 2020-06-08 | Disposition: A | Discharge status: Home / Self Care | Payer: PPO | Source: Ambulatory Visit | Attending: Internal Medicine | Admitting: Internal Medicine

## 2020-06-08 ENCOUNTER — Other Ambulatory Visit: Payer: Self-pay

## 2020-06-08 DIAGNOSIS — R911 Solitary pulmonary nodule: Secondary | ICD-10-CM | POA: Insufficient documentation

## 2020-06-08 DIAGNOSIS — I700 Atherosclerosis of aorta: Secondary | ICD-10-CM | POA: Diagnosis not present

## 2020-06-08 DIAGNOSIS — J9811 Atelectasis: Secondary | ICD-10-CM | POA: Diagnosis not present

## 2020-06-08 DIAGNOSIS — I2510 Atherosclerotic heart disease of native coronary artery without angina pectoris: Secondary | ICD-10-CM | POA: Diagnosis not present

## 2020-06-08 NOTE — Telephone Encounter (Signed)
Patient informed and verbalized understanding

## 2020-06-10 ENCOUNTER — Telehealth: Payer: Self-pay | Admitting: Internal Medicine

## 2020-06-10 ENCOUNTER — Encounter: Payer: Self-pay | Admitting: Internal Medicine

## 2020-06-10 DIAGNOSIS — I2510 Atherosclerotic heart disease of native coronary artery without angina pectoris: Secondary | ICD-10-CM | POA: Insufficient documentation

## 2020-06-10 DIAGNOSIS — I700 Atherosclerosis of aorta: Secondary | ICD-10-CM | POA: Insufficient documentation

## 2020-06-10 DIAGNOSIS — R911 Solitary pulmonary nodule: Secondary | ICD-10-CM | POA: Insufficient documentation

## 2020-06-10 NOTE — Telephone Encounter (Signed)
Pt's wife dropped off blood pressure readings. Placed in folder up front

## 2020-06-11 NOTE — Telephone Encounter (Signed)
Placed on your desk. 

## 2020-06-12 ENCOUNTER — Other Ambulatory Visit: Payer: Self-pay | Admitting: Internal Medicine

## 2020-06-12 DIAGNOSIS — G2581 Restless legs syndrome: Secondary | ICD-10-CM

## 2020-06-12 MED ORDER — ROPINIROLE HCL 2 MG PO TABS: 6.0000 mg | ORAL_TABLET | Freq: Every day | ORAL | 1 refills | Status: DC

## 2020-06-12 NOTE — Telephone Encounter (Signed)
The patient could increase his amlodipine to 10 mg once daily and then have follow-up with his PCP when she returns to the office.  If he develops lightheadedness with increasing the amlodipine he needs to let us know.  He should continue his other blood pressure medications.

## 2020-06-12 NOTE — Telephone Encounter (Signed)
BP are variable. Hold for Dr. Kelly Services.

## 2020-06-12 NOTE — Telephone Encounter (Signed)
Patient informed and verbalized understanding.  Will increase medication and call back in 2 weeks with new readings

## 2020-06-12 NOTE — Telephone Encounter (Signed)
Patient is taking Amlodipine 5 mg in the morning, 2 tablets of the lisinopril/HCTZ, and then Metoprolol 25 mg at night.

## 2020-06-12 NOTE — Telephone Encounter (Signed)
Please confirm if he is taking amlodipine 5 mg once daily, lisinopril-HCTZ 20-12.5 mg tablet 2 tablets twice daily, and metoprolol 25 mg once daily.

## 2020-06-17 ENCOUNTER — Ambulatory Visit: Payer: PPO | Admitting: Physical Therapy

## 2020-06-19 ENCOUNTER — Telehealth: Payer: Self-pay | Admitting: Internal Medicine

## 2020-06-19 NOTE — Telephone Encounter (Signed)
lft vm at [place] at [place] point location [phone].

## 2020-06-23 ENCOUNTER — Ambulatory Visit: Payer: PPO | Admitting: Physical Therapy

## 2020-06-30 ENCOUNTER — Ambulatory Visit: Payer: PPO | Admitting: Physical Therapy

## 2020-07-01 ENCOUNTER — Ambulatory Visit (INDEPENDENT_AMBULATORY_CARE_PROVIDER_SITE_OTHER): Payer: PPO | Admitting: Internal Medicine

## 2020-07-01 ENCOUNTER — Other Ambulatory Visit: Payer: Self-pay

## 2020-07-01 ENCOUNTER — Ambulatory Visit: Payer: PPO | Admitting: Internal Medicine

## 2020-07-01 ENCOUNTER — Encounter: Payer: Self-pay | Admitting: Internal Medicine

## 2020-07-01 VITALS — BP 132/82 | HR 67 | Temp 98.4°F | Ht 66.0 in | Wt 308.6 lb

## 2020-07-01 DIAGNOSIS — Z6841 Body Mass Index (BMI) 40.0 and over, adult: Secondary | ICD-10-CM | POA: Diagnosis not present

## 2020-07-01 DIAGNOSIS — M5010 Cervical disc disorder with radiculopathy, unspecified cervical region: Secondary | ICD-10-CM | POA: Diagnosis not present

## 2020-07-01 DIAGNOSIS — I10 Essential (primary) hypertension: Secondary | ICD-10-CM | POA: Diagnosis not present

## 2020-07-01 DIAGNOSIS — G8929 Other chronic pain: Secondary | ICD-10-CM

## 2020-07-01 DIAGNOSIS — R413 Other amnesia: Secondary | ICD-10-CM | POA: Diagnosis not present

## 2020-07-01 DIAGNOSIS — G4733 Obstructive sleep apnea (adult) (pediatric): Secondary | ICD-10-CM

## 2020-07-01 DIAGNOSIS — E785 Hyperlipidemia, unspecified: Secondary | ICD-10-CM

## 2020-07-01 DIAGNOSIS — R7303 Prediabetes: Secondary | ICD-10-CM

## 2020-07-01 DIAGNOSIS — Z23 Encounter for immunization: Secondary | ICD-10-CM | POA: Diagnosis not present

## 2020-07-01 DIAGNOSIS — M25571 Pain in right ankle and joints of right foot: Secondary | ICD-10-CM | POA: Diagnosis not present

## 2020-07-01 DIAGNOSIS — I2510 Atherosclerotic heart disease of native coronary artery without angina pectoris: Secondary | ICD-10-CM | POA: Diagnosis not present

## 2020-07-01 NOTE — Progress Notes (Signed)
Chief Complaint  Patient presents with  . Follow-up   F/u with wife  1. HTN improved on norvasc 5, lis 20-12.5 x 2 pills, toprol 25 xl his wife has to monitor him if she does not he misses medication and missed x 2 days since last visit  BP improved since last visit on these meds  Of note pt is not compliant with cpap and encouraged this today 2. CAD in LAD noted on CT chest agreeable to see cards 3. Still having chronic right ankle pain and reports this limits his mobility sent message Dr. Posey Pronto to reach out to patient  Pt having right ankle pain chronic and rec orthotics 03/2020  -can you refer again orthotics/sch appt to see you again please  Would he benefit from boot?   4. Morbid obesity, prediabetes, HLD and lack of exercise and healthy eating ok to nutrition referral  5. Abnormal [place] and CT abdomen pending MRI ab [facility] 08/08/20 3 pm he is claustrophobic and requests Valium  6. Memory loss neurology tapering off gabapentin to 300 mg bid from 600 mg bid due to f/u 10/2020 with [facility] neurology sooner prn he also has a h/o alcohol abuse and we discussed this could have caused memory loss long term    Review of Systems  Constitutional: Negative for weight loss.  HENT: Negative for hearing loss.   Eyes: Negative for blurred vision.  Respiratory: Negative for shortness of breath.   Cardiovascular: Negative for chest pain.  Gastrointestinal: Negative for abdominal pain.  Musculoskeletal: Positive for joint pain.  Skin: Negative for rash.  Neurological: Negative for headaches.  Psychiatric/Behavioral: Positive for memory loss.   Past Medical History:  Diagnosis Date  . Adenomatous colon polyp    tubular  . Adenomatous rectal polyp    tubular  . Asthma   . Depression   . Diverticulosis   . GERD (gastroesophageal reflux disease)   . History of chicken pox   . Hyperlipidemia   . Hypertension   . Lung nodule    CT 10/2014  . OSA (obstructive sleep apnea)   . Sleep apnea    wears  cpap  . Vitamin D deficiency    Past Surgical History:  Procedure Laterality Date  . COLONOSCOPY    . COLONOSCOPY W/ POLYPECTOMY  2015   [facility], benign  . HERNIA REPAIR     inguinal  . POLYPECTOMY    . TONSILLECTOMY     age 33    Family History  Problem Relation Age of Onset  . Depression Mother   . Hypertension Father   . Heart disease Father   . Stroke Father   . Cancer Maternal Grandmother        ovarian?  . Cancer Paternal Grandfather        prostate  . Prostate cancer Paternal Grandfather   . Colon cancer Neg Hx   . Colon polyps Neg Hx    Social History   Socioeconomic History  . Marital status: Married    Spouse name: Not on file  . Number of children: Not on file  . Years of education: Not on file  . Highest education level: Not on file  Occupational History  . Not on file  Tobacco Use  . Smoking status: Never Smoker  . Smokeless tobacco: Never Used  Vaping Use  . Vaping Use: Never used  Substance and Sexual Activity  . Alcohol use: Yes    Alcohol/week: 0.0 standard drinks    Comment:  2-3 beers daily  . Drug use: No  . Sexual activity: Never  Other Topics Concern  . Not on file  Social History Narrative   Lives in [place] with wife, [name] 23 years as of 10/2018. 1 daughter in [place].      Work - Optician, dispensing, now Animal nutritionist      From [place] Strain:   . Difficulty of Paying Living Expenses: Not on file  Food Insecurity:   . Worried About Charity fundraiser in the Last Year: Not on file  . Ran Out of Food in the Last Year: Not on file  Transportation Needs: No Transportation Needs  . Lack of Transportation (Medical): No  . Lack of Transportation (Non-Medical): No  Physical Activity:   . Days of Exercise per Week: Not on file  . Minutes of Exercise per Session: Not on file  Stress: No Stress Concern Present  . Feeling of Stress : Not at all  Social Connections:  Moderately Isolated  . Frequency of Communication with Friends and Family: More than three times a week  . Frequency of Social Gatherings with Friends and Family: Never  . Attends Religious Services: Never  . Active Member of Clubs or Organizations: No  . Attends Archivist Meetings: Never  . Marital Status: Married  Human resources officer Violence: Not At Risk  . Fear of Current or Ex-Partner: No  . Emotionally Abused: No  . Physically Abused: No  . Sexually Abused: No   Current Meds  Medication Sig  . amLODipine (NORVASC) 5 MG tablet Take 1 tablet (5 mg total) by mouth daily.  . Cholecalciferol 50 MCG (2000 UT) CAPS Take 2,000 Units by mouth.  . diclofenac Sodium (VOLTAREN) 1 % GEL Apply 2-4 g topically 4 (four) times daily.  Marland Kitchen dicyclomine (BENTYL) 10 MG capsule Take 1 capsule (10 mg total) by mouth 2 (two) times daily.  Marland Kitchen ezetimibe (ZETIA) 10 MG tablet Take 1 tablet (10 mg total) by mouth daily.  . famotidine (PEPCID) 20 MG tablet Take 1 tablet (20 mg total) by mouth daily.  Marland Kitchen gabapentin (NEURONTIN) 600 MG tablet Take 0.5 tablets (300 mg total) by mouth 2 (two) times daily. TAKE 1 TABLET(300 MG) BY MOUTH TWO TIMES DAILY  . lisinopril-hydrochlorothiazide (ZESTORETIC) 20-12.5 MG tablet Take 2 tablets by mouth daily.  Marland Kitchen LORazepam (ATIVAN) 0.5 MG tablet Take 1-2 tablets (0.5-1 mg total) by mouth at bedtime as needed for anxiety.  . metoprolol succinate (TOPROL-XL) 25 MG 24 hr tablet Take 1 tablet (25 mg total) by mouth daily.  Marland Kitchen nystatin cream (MYCOSTATIN) Apply 1 application topically 2 (two) times daily.  Marland Kitchen rOPINIRole (REQUIP) 2 MG tablet Take 3 tablets (6 mg total) by mouth at bedtime.  . sertraline (ZOLOFT) 50 MG tablet Take 1 tablet (50 mg total) by mouth daily.  . trazodone (DESYREL) 300 MG tablet Take 1 tablet (300 mg total) by mouth at bedtime.  . triamcinolone cream (KENALOG) 0.1 % Apply 1 application topically 2 (two) times daily. Prn legs for itching If needed  .  [DISCONTINUED] diazepam (VALIUM) 5 MG tablet Take 5 mg 15 minutes before MRI then another if needed before MRI sch 06/15/20  . [DISCONTINUED] gabapentin (NEURONTIN) 600 MG tablet Take 1 tablet (600 mg total) by mouth 2 (two) times daily. TAKE 1 TABLET(600 MG) BY MOUTH TWO TIMES DAILY (Patient taking differently: Take 300 mg by mouth  2 (two) times daily. TAKE 1 TABLET(300 MG) BY MOUTH TWO TIMES DAILY)   Allergies  Allergen Reactions  . Nsaids Other (See Comments)    GI Issues   Recent Results (from the past 2160 hour(s))  Comprehensive metabolic panel     Status: Abnormal   Collection Time: 05/15/20  4:20 PM  Result Value Ref Range   Glucose, Bld 96 65 - 99 mg/dL    Comment: .            Fasting reference interval .    BUN 16 7 - 25 mg/dL   Creat 0.98 0.70 - 1.25 mg/dL    Comment: For patients >5 years of age, the reference limit for Creatinine is approximately 13% higher for people identified as African-American. .    BUN/Creatinine Ratio NOT APPLICABLE 6 - 22 (calc)   Sodium 140 135 - 146 mmol/L   Potassium 3.9 3.5 - 5.3 mmol/L   Chloride 102 98 - 110 mmol/L   CO2 29 20 - 32 mmol/L   Calcium 8.6 8.6 - 10.3 mg/dL   Total Protein 6.6 6.1 - 8.1 g/dL   Albumin 4.3 3.6 - 5.1 g/dL   Globulin 2.3 1.9 - 3.7 g/dL (calc)   AG Ratio 1.9 1.0 - 2.5 (calc)   Total Bilirubin 0.7 0.2 - 1.2 mg/dL   Alkaline phosphatase (APISO) 76 35 - 144 U/L   AST 42 (H) 10 - 35 U/L   ALT 52 (H) 9 - 46 U/L  CBC with Differential/Platelet     Status: None   Collection Time: 05/15/20  4:20 PM  Result Value Ref Range   WBC 10.6 3.8 - 10.8 Thousand/uL   RBC 5.23 4.20 - 5.80 Million/uL   Hemoglobin 16.0 13.2 - 17.1 g/dL   HCT 47.2 38 - 50 %   MCV 90.2 80.0 - 100.0 fL   MCH 30.6 27.0 - 33.0 pg   MCHC 33.9 32.0 - 36.0 g/dL   RDW 12.9 11.0 - 15.0 %   Platelets 185 140 - 400 Thousand/uL   MPV 10.1 7.5 - 12.5 fL   Neutro Abs 7,250 1,500 - 7,800 cells/uL   Lymphs Abs 2,364 850 - 3,900 cells/uL   Absolute  Monocytes 827 200 - 950 cells/uL   Eosinophils Absolute 127 15 - 500 cells/uL   Basophils Absolute 32 0 - 200 cells/uL   Neutrophils Relative % 68.4 %   Total Lymphocyte 22.3 %   Monocytes Relative 7.8 %   Eosinophils Relative 1.2 %   Basophils Relative 0.3 %  NT-proBNP     Status: None   Collection Time: 05/15/20  4:20 PM  Result Value Ref Range   Pro B Natriuretic peptide (BNP) 13 pg/mL    Comment: . For Heart Failure (HF) diagnosis, reference ranges in patients with dyspnea are based on Januzzi JL, Et al. Am Coll Cardiol. 4481;85:6314-9702. Marland Kitchen 18-49 years: <= 300 pg/mL Normal, HF unlikely >= 450 pg/mL High probability of HF 50-75 years: <= 300 pg/mL Normal, HF unlikely >= 900 pg/mL High probability of HF >75 years: <= 300 pg/mL Normal, HF unlikely >= 1800 pg/mL High probability of HF . For patients with coronary heart disease, the optimal risk category cut points for incident HF or CVD death (<253 pg/mL men, <372 pg/mL women) are based on Omland T, et al. J Am Coll Cardiol. 6378:58:850-27. . For patients with existing HF, the optimal risk category cut point for HF progression (<300 pg/mL) is based on Binnie Kand, et al.  Clin Biochem. 2010;43:1405-10. . For additional information, please refer to http://education.questdiagnostics.com/faq/FAQ202 (This link is being provided for informational/ educational purposes only.) .    Objective  Body mass index is 49.81 kg/m. Wt Readings from Last 3 Encounters:  07/01/20 (!) 308 lb 9.6 oz (140 kg)  05/15/20 (!) 313 lb 6.4 oz (142.2 kg)  05/07/20 (!) 308 lb (139.7 kg)   Temp Readings from Last 3 Encounters:  07/01/20 98.4 F (36.9 C) (Oral)  05/15/20 98.3 F (36.8 C) (Oral)  01/23/20 99 F (37.2 C) (Temporal)   BP Readings from Last 3 Encounters:  07/01/20 132/82  05/15/20 (!) 140/86  05/07/20 120/82   Pulse Readings from Last 3 Encounters:  07/01/20 67  05/15/20 86  05/07/20 72    Physical Exam Vitals and  nursing note reviewed.  Constitutional:      Appearance: Normal appearance. He is well-developed and well-groomed. He is morbidly obese.  HENT:     Head: Normocephalic and atraumatic.  Eyes:     Conjunctiva/sclera: Conjunctivae normal.     Pupils: Pupils are equal, round, and reactive to light.  Cardiovascular:     Rate and Rhythm: Normal rate and regular rhythm.     Heart sounds: Normal heart sounds. No murmur heard.   Pulmonary:     Effort: Pulmonary effort is normal.     Breath sounds: Normal breath sounds.  Musculoskeletal:       Legs:  Skin:    General: Skin is warm and dry.  Neurological:     General: No focal deficit present.     Mental Status: He is alert and oriented to person, place, and time. Mental status is at baseline.     Gait: Gait normal.  Psychiatric:        Attention and Perception: Attention and perception normal.        Mood and Affect: Mood and affect normal.        Speech: Speech normal.        Behavior: Behavior normal. Behavior is cooperative.        Thought Content: Thought content normal.        Cognition and Memory: Cognition and memory normal.        Judgment: Judgment normal.     Assessment  Plan  Essential hypertension - Plan: Ambulatory referral to Cardiology, Amb ref to Medical Nutrition Therapy-MNT, Ambulatory referral to [place] RN Cont meds  Monitor goal <130/<80  Coronary artery disease involving native coronary artery of native heart without angina pectoris - Plan: Ambulatory referral to Cardiology, Amb ref to Medical Nutrition Therapy-MNT, Ambulatory referral to [facility] [place]  Hyperlipidemia, unspecified hyperlipidemia type - Plan: Ambulatory referral to Cardiology, Amb ref to Medical Nutrition Therapy-MNT, Ambulatory referral to [place] obesity with BMI of 45.0-49.9, adult ([place]) - Plan: Amb ref to Medical Nutrition Therapy-MNT, Ambulatory referral to Home Health  Prediabetes - Plan: Amb ref to Medical Nutrition  Therapy-MNT, Ambulatory referral to Home Health  OSA (obstructive sleep apnea) - Plan: Ambulatory referral to [facility] rec use cpap   Cervical disc disorder with radiculopathy of cervical region - Plan: gabapentin (NEURONTIN) 300 MG tablet bid  Chronic pain of right ankle Sent message Dr. Posey Pronto  Pt having right ankle pain chronic and rec orthotics 03/2020  -can you refer again orthotics/sch appt to see you again please  Would he benefit from boot?   Memory loss  F/u [facility] neurology due 10/2020 f/u sooner prn   HM Flu shot  utd given today tdap utd, prevnar, pna 23  Consider shingrix in future covid 19 vx had 2/2pfizer consider booster -->booster due 07/2020 08/2020   Never smoker Nl PSA1.04 07/09/19 Colonoscopy h/o multiple polyps due 04/12/2019 [facility] -pt wants to holdas of 12/31/2020and 01/21/20-->disc at f/u   Consider derm in future notneeded now  MMR immune and hep C neg rec healthy diet and exercise Consider dermatology referral for isks trunk and Sks and skin cancer screening in future pt will let me know if wants in future  Provider: Dr. [name] McLean-Scocuzza-Internal Medicine

## 2020-07-01 NOTE — Progress Notes (Signed)
Pre visit review using our clinic review tool, if applicable. No additional management support is needed unless otherwise documented below in the visit note. 

## 2020-07-01 NOTE — Patient Instructions (Addendum)
Copy of covid card  Booster due in 6-8 months 07/2020 or 08/2020 Goal blood <130/<80  Too low 90s/60s   Memory Compensation Strategies  1. Use "WARM" strategy.  W= write it down  A= associate it  R= repeat it  M= make a mental note  2.   You can keep a Social worker.  Use a 3-ring notebook with sections for the following: calendar, important names and phone numbers,  medications, doctors' names/phone numbers, lists/reminders, and a section to journal what you did  each day.   3.    Use a calendar to write appointments down.  4.    Write yourself a schedule for the day.  This can be placed on the calendar or in a separate section of the Memory Notebook.  Keeping a  regular schedule can help memory.  5.    Use medication organizer with sections for each day or morning/evening pills.  You may need help loading it  6.    Keep a basket, or pegboard by the door.  Place items that you need to take out with you in the basket or on the pegboard.  You may also want to  include a message board for reminders.  7.    Use sticky notes.  Place sticky notes with reminders in a place where the task is performed.  For example: " turn off the  stove" placed by the stove, "lock the door" placed on the door at eye level, " take your medications" on  the bathroom mirror or by the place where you normally take your medications.  8.    Use alarms/timers.  Use while cooking to remind yourself to check on food or as a reminder to take your medicine, or as a  reminder to make a call, or as a reminder to perform another task, etc.   DASH Eating Plan DASH stands for "Dietary Approaches to Stop Hypertension." The DASH eating plan is a healthy eating plan that has been shown to reduce high blood pressure (hypertension). It may also reduce your risk for type 2 diabetes, heart disease, and stroke. The DASH eating plan may also help with weight loss. What are tips for following this plan?  General  guidelines  Avoid eating more than 2,300 mg (milligrams) of salt (sodium) a day. If you have hypertension, you may need to reduce your sodium intake to 1,500 mg a day.  Limit alcohol intake to no more than 1 drink a day for nonpregnant women and 2 drinks a day for men. One drink equals 12 oz of beer, 5 oz of wine, or 1 oz of hard liquor.  Work with your health care provider to maintain a healthy body weight or to lose weight. Ask what an ideal weight is for you.  Get at least 30 minutes of exercise that causes your heart to beat faster (aerobic exercise) most days of the week. Activities may include walking, swimming, or biking.  Work with your health care provider or diet and nutrition specialist (dietitian) to adjust your eating plan to your individual calorie needs. Reading food labels   Check food labels for the amount of sodium per serving. Choose foods with less than 5 percent of the Daily Value of sodium. Generally, foods with less than 300 mg of sodium per serving fit into this eating plan.  To find whole grains, look for the word "whole" as the first word in the ingredient list. Shopping  Buy products labeled as "low-sodium"  or "no salt added."  Buy fresh foods. Avoid canned foods and premade or frozen meals. Cooking  Avoid adding salt when cooking. Use salt-free seasonings or herbs instead of table salt or sea salt. Check with your health care provider or pharmacist before using salt substitutes.  Do not fry foods. Cook foods using healthy methods such as baking, boiling, grilling, and broiling instead.  Cook with heart-healthy oils, such as olive, canola, soybean, or sunflower oil. Meal planning  Eat a balanced diet that includes: ? 5 or more servings of fruits and vegetables each day. At each meal, try to fill half of your plate with fruits and vegetables. ? Up to 6-8 servings of whole grains each day. ? Less than 6 oz of lean meat, poultry, or fish each day. A 3-oz  serving of meat is about the same size as a deck of cards. One egg equals 1 oz. ? 2 servings of low-fat dairy each day. ? A serving of nuts, seeds, or beans 5 times each week. ? Heart-healthy fats. Healthy fats called Omega-3 fatty acids are found in foods such as flaxseeds and coldwater fish, like sardines, salmon, and mackerel.  Limit how much you eat of the following: ? Canned or prepackaged foods. ? Food that is high in trans fat, such as fried foods. ? Food that is high in saturated fat, such as fatty meat. ? Sweets, desserts, sugary drinks, and other foods with added sugar. ? Full-fat dairy products.  Do not salt foods before eating.  Try to eat at least 2 vegetarian meals each week.  Eat more home-cooked food and less restaurant, buffet, and fast food.  When eating at a restaurant, ask that your food be prepared with less salt or no salt, if possible. What foods are recommended? The items listed may not be a complete list. Talk with your dietitian about what dietary choices are best for you. Grains Whole-grain or whole-wheat bread. Whole-grain or whole-wheat pasta. Brown rice. [place]. Bulgur. Whole-grain and low-sodium cereals. Pita bread. Low-fat, low-sodium crackers. Whole-wheat flour tortillas. Vegetables Fresh or frozen vegetables (raw, steamed, roasted, or grilled). Low-sodium or reduced-sodium tomato and vegetable juice. Low-sodium or reduced-sodium tomato sauce and tomato paste. Low-sodium or reduced-sodium canned vegetables. Fruits All fresh, dried, or frozen fruit. Canned fruit in natural juice (without added sugar). Meat and other protein foods Skinless chicken or [place]. Ground chicken or [place]. Pork with fat trimmed off. Fish and seafood. Egg whites. Dried beans, peas, or lentils. Unsalted nuts, nut butters, and seeds. Unsalted canned beans. Lean cuts of beef with fat trimmed off. Low-sodium, lean deli meat. Dairy Low-fat (1%) or fat-free (skim) milk.  Fat-free, low-fat, or reduced-fat cheeses. Nonfat, low-sodium ricotta or cottage cheese. Low-fat or nonfat yogurt. Low-fat, low-sodium cheese. Fats and oils Soft margarine without trans fats. Vegetable oil. Low-fat, reduced-fat, or light mayonnaise and salad dressings (reduced-sodium). Canola, safflower, olive, soybean, and sunflower oils. Avocado. Seasoning and other foods Herbs. Spices. Seasoning mixes without salt. Unsalted popcorn and pretzels. Fat-free sweets. What foods are not recommended? The items listed may not be a complete list. Talk with your dietitian about what dietary choices are best for you. Grains Baked goods made with fat, such as croissants, muffins, or some breads. Dry pasta or rice meal packs. Vegetables Creamed or fried vegetables. Vegetables in a cheese sauce. Regular canned vegetables (not low-sodium or reduced-sodium). Regular canned tomato sauce and paste (not low-sodium or reduced-sodium). Regular tomato and vegetable juice (not low-sodium or reduced-sodium). Angie Fava. Olives.  Fruits Canned fruit in a light or heavy syrup. Fried fruit. Fruit in cream or butter sauce. Meat and other protein foods Fatty cuts of meat. Ribs. Fried meat. Berniece Salines. Sausage. Bologna and other processed lunch meats. Salami. Fatback. Hotdogs. Bratwurst. Salted nuts and seeds. Canned beans with added salt. Canned or smoked fish. Whole eggs or egg yolks. Chicken or [place] with skin. Dairy Whole or 2% milk, cream, and half-and-half. Whole or full-fat cream cheese. Whole-fat or sweetened yogurt. Full-fat cheese. Nondairy creamers. Whipped toppings. Processed cheese and cheese spreads. Fats and oils Butter. Stick margarine. Lard. Shortening. Ghee. Bacon fat. Tropical oils, such as coconut, palm kernel, or palm oil. Seasoning and other foods Salted popcorn and pretzels. Onion salt, garlic salt, seasoned salt, table salt, and sea salt. Worcestershire sauce. Tartar sauce. Barbecue sauce. Teriyaki sauce.  Soy sauce, including reduced-sodium. Steak sauce. Canned and packaged gravies. Fish sauce. Oyster sauce. Cocktail sauce. Horseradish that you find on the shelf. Ketchup. Mustard. Meat flavorings and tenderizers. Bouillon cubes. Hot sauce and Tabasco sauce. Premade or packaged marinades. Premade or packaged taco seasonings. Relishes. Regular salad dressings. Where to find more information:  National Heart, Lung, and [place]: https://wilson-eaton.com/  American Heart Association: www.heart.org Summary  The DASH eating plan is a healthy eating plan that has been shown to reduce high blood pressure (hypertension). It may also reduce your risk for type 2 diabetes, heart disease, and stroke.  With the DASH eating plan, you should limit salt (sodium) intake to 2,300 mg a day. If you have hypertension, you may need to reduce your sodium intake to 1,500 mg a day.  When on the DASH eating plan, aim to eat more fresh fruits and vegetables, whole grains, lean proteins, low-fat dairy, and heart-healthy fats.  Work with your health care provider or diet and nutrition specialist (dietitian) to adjust your eating plan to your individual calorie needs. This information is not intended to replace advice given to you by your health care provider. Make sure you discuss any questions you have with your health care provider. Document Revised: 09/15/2017 Document Reviewed: 09/26/2016 Elsevier Patient Education  [place].  Coronary Artery Disease, Male Coronary artery disease (CAD) is a condition in which the arteries that lead to the heart (coronary arteries) become narrow or blocked. The narrowing or blockage can lead to decreased blood flow to the heart. Prolonged reduced blood flow can cause a heart attack (myocardial infarction or MI). This condition may also be called coronary heart disease. Because CAD is the leading cause of death in men, it is important to understand what causes this condition and  how it is treated. What are the causes? CAD is most often caused by atherosclerosis. This is the buildup of fat and cholesterol (plaque) on the inside of the arteries. Over time, the plaque may narrow or block the artery, reducing blood flow to the heart. Plaque can also become weak and break off within a coronary artery and cause a sudden blockage. Other less common causes of CAD include:  A blood clot or a piece of a blood clot or other substance that blocks the flow of blood in a coronary artery (embolism).  A tearing of the artery (spontaneous coronary artery dissection).  An enlargement of an artery (aneurysm).  Inflammation (vasculitis) in the artery wall. What increases the risk? The following factors may make you more likely to develop this condition:  Age. Men over age 26 are at a greater risk of CAD.  Family history  of CAD.  Gender. Men often develop CAD earlier in life than women.  High blood pressure (hypertension).  Diabetes.  High cholesterol levels.  Tobacco use.  Excessive alcohol use.  Lack of exercise.  A diet high in saturated and trans fats, such as fried food and processed meat. Other possible risk factors include:  High stress levels.  Depression.  Obesity.  Sleep apnea. What are the signs or symptoms? Many people do not have any symptoms during the early stages of CAD. As the condition progresses, symptoms may include:  Chest pain (angina). The pain can: ? Feel like crushing or squeezing, or like a tightness, pressure, fullness, or heaviness in the chest. ? Last more than a few minutes or can stop and recur. The pain tends to get worse with exercise or stress and to fade with rest.  Pain in the arms, neck, jaw, ear, or back.  Unexplained heartburn or indigestion.  Shortness of breath.  Nausea or vomiting.  Sudden light-headedness.  Sudden cold sweats.  Fluttering or fast heartbeat (palpitations). How is this diagnosed? This  condition is diagnosed based on:  Your family and medical history.  A physical exam.  Tests, including: ? A test to check the electrical signals in your heart (electrocardiogram). ? Exercise stress test. This looks for signs of blockage when the heart is stressed with exercise, such as running on a treadmill. ? Pharmacologic stress test. This test looks for signs of blockage when the heart is being stressed with a medicine. ? Blood tests. ? Coronary angiogram. This is a procedure to look at the coronary arteries to see if there is any blockage. During this test, a dye is injected into your arteries so they appear on an X-ray. ? Coronary artery CT scan. This CT scan helps detect calcium deposits in your coronary arteries. Calcium deposits are an indicator of CAD. ? A test that uses sound waves to take a picture of your heart (echocardiogram). ? Chest X-ray. How is this treated? This condition may be treated by:  Healthy lifestyle changes to reduce risk factors.  Medicines such as: ? Antiplatelet medicines and blood-thinning medicines, such as aspirin. These help to prevent blood clots. ? Nitroglycerin. ? Blood pressure medicines. ? Cholesterol-lowering medicine.  Coronary angioplasty and stenting. During this procedure, a thin, flexible tube is inserted through a blood vessel and into a blocked artery. A balloon or similar device on the end of the tube is inflated to open up the artery. In some cases, a small, mesh tube (stent) is inserted into the artery to keep it open.  Coronary artery bypass surgery. During this surgery, veins or arteries from other parts of the body are used to create a bypass around the blockage and allow blood to reach your heart. Follow these instructions at home: Medicines  Take over-the-counter and prescription medicines only as told by your health care provider.  Do not take the following medicines unless your health care provider approves: ? NSAIDs, such  as ibuprofen, naproxen, or celecoxib. ? Vitamin supplements that contain vitamin A, vitamin E, or both. Lifestyle  Follow an exercise program approved by your health care provider. Aim for 150 minutes of moderate exercise or 75 minutes of vigorous exercise each week.  Maintain a healthy weight or lose weight as approved by your health care provider.  Learn to manage stress or try to limit your stress. Ask your health care provider for suggestions if you need help.  Get screened for depression and seek  treatment, if needed.  Do not use any products that contain nicotine or tobacco, such as cigarettes, e-cigarettes, and chewing tobacco. If you need help quitting, ask your health care provider.  Do not use illegal drugs. Eating and drinking   Follow a heart-healthy diet. A dietitian can help educate you about healthy food options and changes. In general, eat plenty of fruits and vegetables, lean meats, and whole grains.  Avoid foods high in: ? Sugar. ? Salt (sodium). ? Saturated fat, such as processed or fatty meat. ? Trans fat, such as fried foods.  Use healthy cooking methods such as roasting, grilling, broiling, baking, poaching, steaming, or stir-frying.  Do not drink alcohol if your health care provider tells you not to drink.  If you drink alcohol: ? Limit how much you have to 0-2 drinks per day. ? Be aware of how much alcohol is in your drink. In the [place] one drink equals one 12 oz bottle of beer (355 mL), one 5 oz glass of wine (148 mL), or one 1 oz glass of hard liquor (44 mL). General instructions  Manage any other health conditions, such as hypertension and diabetes. These conditions affect your heart.  Your health care provider may ask you to monitor your blood pressure. Ideally, your blood pressure should be below 130/80.  Keep all follow-up visits as told by your health care provider. This is important. Get help right away if:  You have pain in your chest, neck,  ear, arm, jaw, stomach, or back that: ? Lasts more than a few minutes. ? Is recurring. ? Is not relieved by taking medicine under your tongue (sublingual nitroglycerin).  You have profuse sweating without cause.  You have unexplained: ? Heartburn or indigestion. ? Shortness of breath or difficulty breathing. ? Fluttering or fast heartbeat (palpitations). ? Nausea or vomiting. ? Fatigue. ? Feelings of nervousness or anxiety. ? Weakness. ? Diarrhea.  You have sudden light-headedness or dizziness.  You faint.  You feel like hurting yourself or think about taking your own life. These symptoms may represent a serious problem that is an emergency. Do not wait to see if the symptoms will go away. Get medical help right away. Call your local emergency services (911 in the [place] Do not drive yourself to the hospital. Summary  Coronary artery disease (CAD) is a condition in which the arteries that lead to the heart (coronary arteries) become narrow or blocked. The narrowing or blockage can lead to a heart attack.  Many people do not have any symptoms during the early stages of CAD.  CAD can be treated with lifestyle changes, medicines, surgery, or a combination of these treatments. This information is not intended to replace advice given to you by your health care provider. Make sure you discuss any questions you have with your health care provider. Document Revised: 06/22/2018 Document Reviewed: 06/12/2018 Elsevier Patient Education  2020 [place].  Nonalcoholic Fatty Liver Disease Diet, Adult Nonalcoholic fatty liver disease is a condition that causes fat to build up in and around the liver. The disease makes it harder for the liver to work the way that it should. Following a healthy diet can help to keep nonalcoholic fatty liver disease under control. It can also help to prevent or improve conditions that are associated with the disease, such as heart disease, diabetes, high blood  pressure, and abnormal cholesterol levels. Along with regular exercise, this diet:  Promotes weight loss.  Helps to control blood sugar levels.  Helps  to improve the way that the body uses insulin. What are tips for following this plan? Reading food labels Always check food labels for:  The amount of saturated fat in a food. You should limit your intake of saturated fat. Saturated fat is found in foods that come from animals, including meat and dairy products such as butter, cheese, and whole milk.  The amount of fiber in a food. You should choose high-fiber foods such as fruits, vegetables, and whole grains. Try to get 25-30 grams (g) of fiber a day.  Cooking  When cooking, use heart-healthy oils that are high in monounsaturated fats. These include olive oil, canola oil, and avocado oil.  Limit frying or deep-frying foods. Cook foods using healthy methods such as baking, boiling, steaming, and grilling instead. Meal planning  You may want to keep track of how many calories you take in. Eating the right amount of calories will help you achieve a healthy weight. Meeting with a registered dietitian can help you get started.  Limit how often you eat takeout and fast food. These foods are usually very high in fat, salt, and sugar.  Use the glycemic index (GI) to plan your meals. The index tells you how quickly a food will raise your blood sugar. Choose low-GI foods (GI less than 55). These foods take a longer time to raise blood sugar. A registered dietitian can help you identify foods lower on the GI scale. Lifestyle  You may want to follow a Mediterranean diet. This diet includes a lot of vegetables, lean meats or fish, whole grains, fruits, and healthy oils and fats. What foods can I eat?  Fruits Bananas. Apples. Oranges. Grapes. Papaya. Mango. Pomegranate. Kiwi. Grapefruit. Cherries. Vegetables Lettuce. Spinach. Peas. Beets. Cauliflower. Cabbage. Broccoli. Carrots. Tomatoes.  Squash. Eggplant. Herbs. Peppers. Onions. Cucumbers. Brussels sprouts. Yams and sweet potatoes. Beans. Lentils. Grains Whole wheat or whole-grain foods, including breads, crackers, cereals, and pasta. Stone-ground whole wheat. Unsweetened oatmeal. Bulgur. Barley. Quinoa. Brown or wild rice. Corn or whole wheat flour tortillas. Meats and other proteins Lean meats. Poultry. Tofu. Seafood and shellfish. Dairy Low-fat or fat-free dairy products, such as yogurt, cottage cheese, or cheese. Beverages Water. Sugar-free drinks. Tea. Coffee. Low-fat or skim milk. Milk alternatives, such as soy or almond milk. Real fruit juice. Fats and oils Avocado. Canola or olive oil. Nuts and nut butters. Seeds. Seasonings and condiments Mustard. Relish. Low-fat, low-sugar ketchup and barbecue sauce. Low-fat or fat-free mayonnaise. Sweets and desserts Sugar-free sweets. The items listed above may not be a complete list of foods and beverages you can eat. Contact a dietitian for more information. What foods should I limit or avoid? Meats and other proteins Limit red meat to 1-2 times a week. Dairy [employer]. Fats and oils Palm oil and coconut oil. Fried foods. Other foods Processed foods. Foods that contain a lot of salt or sodium. Sweets and desserts Sweets that contain sugar. Beverages Sweetened drinks, such as sweet tea, milkshakes, iced sweet drinks, and sodas. Alcohol. The items listed above may not be a complete list of foods and beverages you should avoid. Contact a dietitian for more information. Where to find more information The [employer] of Diabetes and Digestive and Kidney Diseases: [employer].is Summary  Nonalcoholic fatty liver disease is a condition that causes fat to build up in and around the liver.  Following a healthy diet can help to keep nonalcoholic fatty liver disease under control. Your diet should be rich in fruits, vegetables, whole grains,  and lean  proteins.  Limit your intake of saturated fat. Saturated fat is found in foods that come from animals, including meat and dairy products such as butter, cheese, and whole milk.  This diet promotes weight loss, helps to control blood sugar levels, and helps to improve the way that the body uses insulin. This information is not intended to replace advice given to you by your health care provider. Make sure you discuss any questions you have with your health care provider. Document Revised: 01/25/2019 Document Reviewed: 10/25/2018 Elsevier Patient Education  [place].  Fatty Liver Disease  Fatty liver disease occurs when too much fat has built up in your liver cells. Fatty liver disease is also called hepatic steatosis or steatohepatitis. The liver removes harmful substances from your bloodstream and produces fluids that your body needs. It also helps your body use and store energy from the food you eat. In many cases, fatty liver disease does not cause symptoms or problems. It is often diagnosed when tests are being done for other reasons. However, over time, fatty liver can cause inflammation that may lead to more serious liver problems, such as scarring of the liver (cirrhosis) and liver failure. Fatty liver is associated with insulin resistance, increased body fat, high blood pressure (hypertension), and high cholesterol. These are features of metabolic syndrome and increase your risk for stroke, diabetes, and heart disease. What are the causes? This condition may be caused by:  Drinking too much alcohol.  Poor nutrition.  Obesity.  Cushing's syndrome.  Diabetes.  High cholesterol.  Certain drugs.  Poisons.  Some viral infections.  Pregnancy. What increases the risk? You are more likely to develop this condition if you:  Abuse alcohol.  Are overweight.  Have diabetes.  Have hepatitis.  Have a high triglyceride level.  Are pregnant. What are the signs or  symptoms? Fatty liver disease often does not cause symptoms. If symptoms do develop, they can include:  Fatigue.  Weakness.  Weight loss.  Confusion.  Abdominal pain.  Nausea and vomiting.  Yellowing of your skin and the white parts of your eyes (jaundice).  Itchy skin. How is this diagnosed? This condition may be diagnosed by:  A physical exam and medical history.  Blood tests.  Imaging tests, such as an ultrasound, CT scan, or MRI.  A liver biopsy. A small sample of liver tissue is removed using a needle. The sample is then looked at under a microscope. How is this treated? Fatty liver disease is often caused by other health conditions. Treatment for fatty liver may involve medicines and lifestyle changes to manage conditions such as:  Alcoholism.  High cholesterol.  Diabetes.  Being overweight or obese. Follow these instructions at home:   Do not drink alcohol. If you have trouble quitting, ask your health care provider how to safely quit with the help of medicine or a supervised program. This is important to keep your condition from getting worse.  Eat a healthy diet as told by your health care provider. Ask your health care provider about working with a diet and nutrition specialist (dietitian) to develop an eating plan.  Exercise regularly. This can help you lose weight and control your cholesterol and diabetes. Talk to your health care provider about an exercise plan and which activities are best for you.  Take over-the-counter and prescription medicines only as told by your health care provider.  Keep all follow-up visits as told by your health care provider. This is important.  Contact a health care provider if: You have trouble controlling your:  Blood sugar. This is especially important if you have diabetes.  Cholesterol.  Drinking of alcohol. Get help right away if:  You have abdominal pain.  You have jaundice.  You have nausea and  vomiting.  You vomit blood or material that looks like coffee grounds.  You have stools that are black, tar-like, or bloody. Summary  Fatty liver disease develops when too much fat builds up in the cells of your liver.  Fatty liver disease often causes no symptoms or problems. However, over time, fatty liver can cause inflammation that may lead to more serious liver problems, such as scarring of the liver (cirrhosis).  You are more likely to develop this condition if you abuse alcohol, are pregnant, are overweight, have diabetes, have hepatitis, or have high triglyceride levels.  Contact your health care provider if you have trouble controlling your weight, blood sugar, cholesterol, or drinking of alcohol. This information is not intended to replace advice given to you by your health care provider. Make sure you discuss any questions you have with your health care provider. Document Revised: 09/15/2017 Document Reviewed: 07/12/2017 Elsevier Patient Education  [place].    Prediabetes Prediabetes is the condition of having a blood sugar (blood glucose) level that is higher than it should be, but not high enough for you to be diagnosed with type 2 diabetes. Having prediabetes puts you at risk for developing type 2 diabetes (type 2 diabetes mellitus). Prediabetes may be called impaired glucose tolerance or impaired fasting glucose. Prediabetes usually does not cause symptoms. Your health care provider can diagnose this condition with blood tests. You may be tested for prediabetes if you are overweight and if you have at least one other risk factor for prediabetes. What is blood glucose, and how is it measured? Blood glucose refers to the amount of glucose in your bloodstream. Glucose comes from eating foods that contain sugars and starches (carbohydrates), which the body breaks down into glucose. Your blood glucose level may be measured in mg/dL (milligrams per deciliter) or mmol/L  (millimoles per liter). Your blood glucose may be checked with one or more of the following blood tests:  A fasting blood glucose (FBG) test. You will not be allowed to eat (you will fast) for 8 hours or longer before a blood sample is taken. ? A normal range for FBG is 70-100 mg/dl (3.9-5.6 mmol/L).  An A1c (hemoglobin A1c) blood test. This test provides information about blood glucose control over the previous 2?79months.  An oral glucose tolerance test (OGTT). This test measures your blood glucose at two times: ? After fasting. This is your baseline level. ? Two hours after you drink a beverage that contains glucose. You may be diagnosed with prediabetes:  If your FBG is 100?125 mg/dL (5.6-6.9 mmol/L).  If your A1c level is 5.7?6.4%.  If your OGTT result is 140?199 mg/dL (7.8-11 mmol/L). These blood tests may be repeated to confirm your diagnosis. How can this condition affect me? The pancreas produces a hormone (insulin) that helps to move glucose from the bloodstream into cells. When cells in the body do not respond properly to insulin that the body makes (insulin resistance), excess glucose builds up in the blood instead of going into cells. As a result, high blood glucose (hyperglycemia) can develop, which can cause many complications. Hyperglycemia is a symptom of prediabetes. Having high blood glucose for a long time is dangerous. Too much glucose  in your blood can damage your nerves and blood vessels. Long-term damage can lead to complications from diabetes, which may include:  Heart disease.  Stroke.  Blindness.  Kidney disease.  Depression.  Poor circulation in the feet and legs, which could lead to surgical removal (amputation) in severe cases. What can increase my risk? Risk factors for prediabetes include:  Having a family member with type 2 diabetes.  Being overweight or obese.  Being older than age 48.  Being of American [place], African-American,  Hispanic/Latino, or Asian/Pacific Islander descent.  Having an inactive (sedentary) lifestyle.  Having a history of heart disease.  History of gestational diabetes or polycystic ovary syndrome (PCOS), in women.  Having low levels of good cholesterol (HDL-C) or high levels of blood fats (triglycerides).  Having high blood pressure. What actions can I take to prevent diabetes?      Be physically active. ? Do moderate-intensity physical activity for 30 or more minutes on 5 or more days of the week, or as much as told by your health care provider. This could be brisk walking, biking, or water aerobics. ? Ask your health care provider what activities are safe for you. A mix of physical activities may be best, such as walking, swimming, cycling, and strength training.  Lose weight as told by your health care provider. ? Losing 5-7% of your body weight can reverse insulin resistance. ? Your health care provider can determine how much weight loss is best for you and can help you lose weight safely.  Follow a healthy meal plan. This includes eating lean proteins, complex carbohydrates, fresh fruits and vegetables, low-fat dairy products, and healthy fats. ? Follow instructions from your health care provider about eating or drinking restrictions. ? Make an appointment to see a diet and nutrition specialist (registered dietitian) to help you create a healthy eating plan that is right for you.  Do not smoke or use any tobacco products, such as cigarettes, chewing tobacco, and e-cigarettes. If you need help quitting, ask your health care provider.  Take over-the-counter and prescription medicines as told by your health care provider. You may be prescribed medicines that help lower the risk of type 2 diabetes.  Keep all follow-up visits as told by your health care provider. This is important. Summary  Prediabetes is the condition of having a blood sugar (blood glucose) level that is higher than  it should be, but not high enough for you to be diagnosed with type 2 diabetes.  Having prediabetes puts you at risk for developing type 2 diabetes (type 2 diabetes mellitus).  To help prevent type 2 diabetes, make lifestyle changes such as being physically active and eating a healthy diet. Lose weight as told by your health care provider. This information is not intended to replace advice given to you by your health care provider. Make sure you discuss any questions you have with your health care provider. Document Revised: 01/25/2019 Document Reviewed: 11/24/2015 Elsevier Patient Education  [place].   Nonalcoholic Fatty Liver Disease Diet, Adult Nonalcoholic fatty liver disease is a condition that causes fat to build up in and around the liver. The disease makes it harder for the liver to work the way that it should. Following a healthy diet can help to keep nonalcoholic fatty liver disease under control. It can also help to prevent or improve conditions that are associated with the disease, such as heart disease, diabetes, high blood pressure, and abnormal cholesterol levels. Along with regular exercise,  this diet:  Promotes weight loss.  Helps to control blood sugar levels.  Helps to improve the way that the body uses insulin. What are tips for following this plan? Reading food labels Always check food labels for:  The amount of saturated fat in a food. You should limit your intake of saturated fat. Saturated fat is found in foods that come from animals, including meat and dairy products such as butter, cheese, and whole milk.  The amount of fiber in a food. You should choose high-fiber foods such as fruits, vegetables, and whole grains. Try to get 25-30 grams (g) of fiber a day.  Cooking  When cooking, use heart-healthy oils that are high in monounsaturated fats. These include olive oil, canola oil, and avocado oil.  Limit frying or deep-frying foods. Cook foods using  healthy methods such as baking, boiling, steaming, and grilling instead. Meal planning  You may want to keep track of how many calories you take in. Eating the right amount of calories will help you achieve a healthy weight. Meeting with a registered dietitian can help you get started.  Limit how often you eat takeout and fast food. These foods are usually very high in fat, salt, and sugar.  Use the glycemic index (GI) to plan your meals. The index tells you how quickly a food will raise your blood sugar. Choose low-GI foods (GI less than 55). These foods take a longer time to raise blood sugar. A registered dietitian can help you identify foods lower on the GI scale. Lifestyle  You may want to follow a Mediterranean diet. This diet includes a lot of vegetables, lean meats or fish, whole grains, fruits, and healthy oils and fats. What foods can I eat?  Fruits Bananas. Apples. Oranges. Grapes. Papaya. Mango. Pomegranate. Kiwi. Grapefruit. Cherries. Vegetables Lettuce. Spinach. Peas. Beets. Cauliflower. Cabbage. Broccoli. Carrots. Tomatoes. Squash. Eggplant. Herbs. Peppers. Onions. Cucumbers. Brussels sprouts. Yams and sweet potatoes. Beans. Lentils. Grains Whole wheat or whole-grain foods, including breads, crackers, cereals, and pasta. Stone-ground whole wheat. Unsweetened oatmeal. Bulgur. Barley. Quinoa. Brown or wild rice. Corn or whole wheat flour tortillas. Meats and other proteins Lean meats. Poultry. Tofu. Seafood and shellfish. Dairy Low-fat or fat-free dairy products, such as yogurt, cottage cheese, or cheese. Beverages Water. Sugar-free drinks. Tea. Coffee. Low-fat or skim milk. Milk alternatives, such as soy or almond milk. Real fruit juice. Fats and oils Avocado. Canola or olive oil. Nuts and nut butters. Seeds. Seasonings and condiments Mustard. Relish. Low-fat, low-sugar ketchup and barbecue sauce. Low-fat or fat-free mayonnaise. Sweets and desserts Sugar-free sweets. The  items listed above may not be a complete list of foods and beverages you can eat. Contact a dietitian for more information. What foods should I limit or avoid? Meats and other proteins Limit red meat to 1-2 times a week. Dairy [employer]. Fats and oils Palm oil and coconut oil. Fried foods. Other foods Processed foods. Foods that contain a lot of salt or sodium. Sweets and desserts Sweets that contain sugar. Beverages Sweetened drinks, such as sweet tea, milkshakes, iced sweet drinks, and sodas. Alcohol. The items listed above may not be a complete list of foods and beverages you should avoid. Contact a dietitian for more information. Where to find more information The [employer] of Diabetes and Digestive and Kidney Diseases: [email or web address] Summary  Nonalcoholic fatty liver disease is a condition that causes fat to build up in and around the liver.  Following a healthy diet can help to keep  nonalcoholic fatty liver disease under control. Your diet should be rich in fruits, vegetables, whole grains, and lean proteins.  Limit your intake of saturated fat. Saturated fat is found in foods that come from animals, including meat and dairy products such as butter, cheese, and whole milk.  This diet promotes weight loss, helps to control blood sugar levels, and helps to improve the way that the body uses insulin. This information is not intended to replace advice given to you by your health care provider. Make sure you discuss any questions you have with your health care provider. Document Revised: 01/25/2019 Document Reviewed: 10/25/2018 Elsevier Patient Education  [place].  Fatty Liver Disease  Fatty liver disease occurs when too much fat has built up in your liver cells. Fatty liver disease is also called hepatic steatosis or steatohepatitis. The liver removes harmful substances from your bloodstream and produces fluids that your body needs. It also helps your body  use and store energy from the food you eat. In many cases, fatty liver disease does not cause symptoms or problems. It is often diagnosed when tests are being done for other reasons. However, over time, fatty liver can cause inflammation that may lead to more serious liver problems, such as scarring of the liver (cirrhosis) and liver failure. Fatty liver is associated with insulin resistance, increased body fat, high blood pressure (hypertension), and high cholesterol. These are features of metabolic syndrome and increase your risk for stroke, diabetes, and heart disease. What are the causes? This condition may be caused by:  Drinking too much alcohol.  Poor nutrition.  Obesity.  Cushing's syndrome.  Diabetes.  High cholesterol.  Certain drugs.  Poisons.  Some viral infections.  Pregnancy. What increases the risk? You are more likely to develop this condition if you:  Abuse alcohol.  Are overweight.  Have diabetes.  Have hepatitis.  Have a high triglyceride level.  Are pregnant. What are the signs or symptoms? Fatty liver disease often does not cause symptoms. If symptoms do develop, they can include:  Fatigue.  Weakness.  Weight loss.  Confusion.  Abdominal pain.  Nausea and vomiting.  Yellowing of your skin and the white parts of your eyes (jaundice).  Itchy skin. How is this diagnosed? This condition may be diagnosed by:  A physical exam and medical history.  Blood tests.  Imaging tests, such as an ultrasound, CT scan, or MRI.  A liver biopsy. A small sample of liver tissue is removed using a needle. The sample is then looked at under a microscope. How is this treated? Fatty liver disease is often caused by other health conditions. Treatment for fatty liver may involve medicines and lifestyle changes to manage conditions such as:  Alcoholism.  High cholesterol.  Diabetes.  Being overweight or obese. Follow these instructions at  home:   Do not drink alcohol. If you have trouble quitting, ask your health care provider how to safely quit with the help of medicine or a supervised program. This is important to keep your condition from getting worse.  Eat a healthy diet as told by your health care provider. Ask your health care provider about working with a diet and nutrition specialist (dietitian) to develop an eating plan.  Exercise regularly. This can help you lose weight and control your cholesterol and diabetes. Talk to your health care provider about an exercise plan and which activities are best for you.  Take over-the-counter and prescription medicines only as told by your health care  provider.  Keep all follow-up visits as told by your health care provider. This is important. Contact a health care provider if: You have trouble controlling your:  Blood sugar. This is especially important if you have diabetes.  Cholesterol.  Drinking of alcohol. Get help right away if:  You have abdominal pain.  You have jaundice.  You have nausea and vomiting.  You vomit blood or material that looks like coffee grounds.  You have stools that are black, tar-like, or bloody. Summary  Fatty liver disease develops when too much fat builds up in the cells of your liver.  Fatty liver disease often causes no symptoms or problems. However, over time, fatty liver can cause inflammation that may lead to more serious liver problems, such as scarring of the liver (cirrhosis).  You are more likely to develop this condition if you abuse alcohol, are pregnant, are overweight, have diabetes, have hepatitis, or have high triglyceride levels.  Contact your health care provider if you have trouble controlling your weight, blood sugar, cholesterol, or drinking of alcohol. This information is not intended to replace advice given to you by your health care provider. Make sure you discuss any questions you have with your health care  provider. Document Revised: 09/15/2017 Document Reviewed: 07/12/2017 Elsevier Patient Education  2020 [employer].

## 2020-07-03 ENCOUNTER — Ambulatory Visit: Payer: PPO | Attending: Family Medicine | Admitting: Physical Therapy

## 2020-07-03 ENCOUNTER — Encounter: Payer: Self-pay | Admitting: Physical Therapy

## 2020-07-03 ENCOUNTER — Other Ambulatory Visit: Payer: Self-pay

## 2020-07-03 DIAGNOSIS — R293 Abnormal posture: Secondary | ICD-10-CM

## 2020-07-03 DIAGNOSIS — M25571 Pain in right ankle and joints of right foot: Secondary | ICD-10-CM | POA: Insufficient documentation

## 2020-07-03 DIAGNOSIS — R2689 Other abnormalities of gait and mobility: Secondary | ICD-10-CM | POA: Insufficient documentation

## 2020-07-03 DIAGNOSIS — M25671 Stiffness of right ankle, not elsewhere classified: Secondary | ICD-10-CM | POA: Diagnosis not present

## 2020-07-03 NOTE — Therapy (Signed)
[facility] [address], [place], [address] Phone: [phone]   Fax:  [phone]  Physical Therapy Evaluation  Patient Details  Name: Paul Cook MRN: [id number] Date of Birth: Jun 30, 1951 No data recorded  Encounter Date: 07/03/2020   PT End of Session - 07/03/20 1014    Visit Number 1    Number of Visits 17    Date for PT Re-Evaluation 08/28/20    PT Start Time 0930    PT Stop Time 1015    PT Time Calculation (min) 45 min    Activity Tolerance Patient tolerated treatment well    Behavior During Therapy [facility] for tasks assessed/performed           Past Medical History:  Diagnosis Date  . Adenomatous colon polyp    tubular  . Adenomatous rectal polyp    tubular  . Asthma   . Depression   . Diverticulosis   . GERD (gastroesophageal reflux disease)   . History of chicken pox   . Hyperlipidemia   . Hypertension   . Lung nodule    CT 10/2014  . OSA (obstructive sleep apnea)   . Sleep apnea    wears cpap  . Vitamin D deficiency     Past Surgical History:  Procedure Laterality Date  . COLONOSCOPY    . COLONOSCOPY W/ POLYPECTOMY  2015   [facility], benign  . HERNIA REPAIR     inguinal  . POLYPECTOMY    . TONSILLECTOMY     age 41     There were no vitals filed for this visit.   Subjective Assessment - 07/03/20 0933    Pertinent History Pt is a 69 year old male presenting with R ankle pain over 5 months, reports he cannot remember an event that hurt him, but that he felt like he may have twisted it and it started wearing an ace bandage. Reports ace bandaging helps with ankle stability and pain. Reports he has pain below lateral mallelous and this pain feels like a sharp ache when he walks or stands without ace bandage, and walking >53mins with strap on. Reports pain does not radiate, and denies sensation deficits. Reports he is retired and him and his wife watch a lot of TV. He reports he is able to  drive and complete community errands, and that his wife completes all household chores, can bathe/dress ind. Worst pain over past week 4/10 best 1/10. Pt denies N/V, B&B changes, unexplained weight fluctuation, saddle paresthesia, fever, night sweats, or unrelenting night pain at this time.    Limitations Standing;House hold activities;Lifting    How long can you sit comfortably? unlimited    How long can you stand comfortably? 52mins    How long can you walk comfortably? 23mins    Diagnostic tests Xray April 2021 negative    Patient Stated Goals Decrease pain    Currently in Pain? Yes    Pain Score 1     Pain Location Ankle    Pain Orientation Right;Lateral    Pain Descriptors / Indicators Aching;Sharp    Pain Type Chronic pain    Pain Radiating Towards none    Pain Onset More than a month ago    Pain Frequency Constant    Aggravating Factors  walking or standing without ace bandage, walking >3mins with ace bandage on    Pain Relieving Factors rest    Effect of Pain on Daily Activities unable to complete  community ambulation without pain              OBJECTIVE  MUSCULOSKELETAL: Tremor: Absent Bulk: Normal Tone: Normal, no clonus No trophic changes noted to foot/ankle. No ecchymosis, erythema, or edema noted. No gross ankle/foot deformity noted  Lumbar/Hip/Knee Screen AROM: WFL and painless with overpressure in all planes  Posture bilat hip ER and ankle eversion R>L; decreased arch bilat, increased midfoot pronation in wt bearing  Gait Excessive L ankle eversion, wide BOS  Palpation Pain to palpation with concordant pain sign inferior to lateral malleolus along calcaneofibular ligament  Strength R/L 5/5 Hip flexion 5/5 Hip external rotation 5/5 Hip internal rotation 5/5 Hip extension  5/5 Hip abduction 5/5 Hip adduction 5/5 Knee extension 5/5 Knee flexion 5/5 Ankle Plantarflexion 5/5 Ankle Dorsiflexion 5/5 Ankle Inversion 5*/5 Ankle  Eversion   AROM R/L 50/50 Ankle Plantarflexion 20/22 Ankle Dorsiflexion 28/46 Ankle Inversion 22*/21 Ankle Eversion *Indicates Pain  PROM R/L 50/50 Ankle Plantarflexion 23/23 Ankle Dorsiflexion 35/48 Ankle Inversion 22/22 Ankle Eversion *Indicates Pain  Passive Accessory Motion Superior Tibiofibular Joint: WNL Inferior Tibiofibular Joint: WNL Talocrural Joint Distraction: WNL Talocrural Joint AP: WNL Talocrural Joint PA: WNL  NEUROLOGICAL:  Mental Status Patient is oriented to person, place and time.  Recent memory is intact.  Remote memory is intact.  Attention span and concentration are intact.  Expressive speech is intact.  Patient's fund of knowledge is within normal limits for educational level.  Sensation Grossly intact to light touch bilateral LEs as determined by testing dermatomes L2-S2 Proprioception and hot/cold testing deferred on this date  VASCULAR Dorsalis pedis and posterior tibial pulses are palpable   SPECIAL TESTS  Ligamentous Integrity Anterior Drawer (ATF, 10-15 plantarflexion with anterior translation) Negative  Talar Tilt (CFL, inversion): mildly position  Eversion Stress Test (Deltoid, eversion): Negative  External Rotation Test (High ankle, dorsiflexion and external rotation): Negative  Squeeze Test (High ankle): Negative  Impingment Sign (Dorsiflexion and eversion): Negative   Achilles Integrity Thompson Test: Negative   Fracture Screening Metatarsal Axial Loading: Negative  Tap/Percussion Test: Negative   Pronation/Supination Navicular Drop: Positive bilat  Nerve Test Tarsal Tunnel Test (maximal DF, EV, toe ext with tapping over tarsal tunnel):Negative   Other Windlass Mechanism Test: Negative bilat  SLS unable bilat    Ther-Ex PT educated patient on ankle anatomy with visual aid with education on importance of strengthening muscles surrounding the ankle to aid in stability. PT reviewed the following HEP with patient  with patient able to demonstrate a set of the following with min cuing for correction needed. PT educated patient on parameters of therex (how/when to inc/decrease intensity, frequency, rep/set range, stretch hold time, and purpose of therex) with verbalized understanding.  Access Code: [email or web address] Standing Single Leg Stance with Counter Support - 3 x daily - 7 x weekly - 10sec hold Seated Ankle Alphabet - 3 x daily - 7 x weekly                       PT Education - 07/03/20 0945    Education Details Patient was educated on diagnosis, anatomy and pathology involved, prognosis, role of PT, and was given an HEP, demonstrating exercise with proper form following verbal and tactile cues, and was given a paper hand out to continue exercise at home. Pt was educated on and agreed to plan of care.    Person(s) Educated Patient    Methods Explanation;Demonstration;Tactile cues;Verbal cues    Comprehension Verbalized understanding;Returned demonstration;Verbal cues  required            PT Short Term Goals - 07/03/20 1059      PT SHORT TERM GOAL #1   Title Pt will be independent with HEP in order to decrease ankle pain and increase strength in order to improve pain-free function at home and work.    Baseline 07/03/20 HEP given    Time 4    Period Weeks    Status New             PT Long Term Goals - 07/03/20 1059      PT LONG TERM GOAL #1   Title Pt will decrease worst pain as reported on NPRS by at least 3 points in order to demonstrate clinically significant reduction in ankle/foot pain.    Baseline 07/03/20 4/10    Time 8    Period Weeks    Status New      PT LONG TERM GOAL #2   Title Patient will increase FOTO score to 65 to demonstrate predicted increase in functional mobility to complete ADLs    Baseline 07/03/20 52    Time 8    Period Weeks    Status New      PT LONG TERM GOAL #3   Title Patient will be able to demonstrate SLS of 10sec bilat to demonstrate reduced  fall risk and increased ankle stability    Baseline 07/03/20 unable bilat    Time 8    Period Weeks    Status New                 Plan - 07/03/20 1016    Clinical Impression Statement Pt is a 69 year old male presenting with chronic R ankle pain, signs and symptoms of previous calcanofibular ligment involvement and ankle instability. Impairments in decreased balance, decreased coordination, decreaed intrinsic foot strength, postural deficits (bilat midfoot pronation, hip ER and ankle eversion), and abnormal gait. Activity limitations in ambulation, standing, and stair negotation without support; inhibiting full participation in ADLs and community ambulation/activity. Would benefit from skilled PT to address aforementioned impairments to increase ind with ADLs and community activity.    Personal Factors and Comorbidities Age;Comorbidity 1;Comorbidity 2;Comorbidity 3+;Fitness;Past/Current Experience;Time since onset of injury/illness/exacerbation    Comorbidities GERD, HTN, HLD    Examination-Activity Limitations Lift;Locomotion Level;Squat;Transfers;Stairs;Stand    Examination-Participation Restrictions Community Activity;Driving;Cleaning;Yard Work    [employer] Evolving/Moderate complexity    Clinical Decision Making Moderate    Rehab Potential Good    PT Frequency 2x / week    PT Duration 8 weeks    PT Treatment/Interventions ADLs/Self Care Home Management;Iontophoresis 4mg /ml Dexamethasone;Stair training;Therapeutic exercise;Passive range of motion;Spinal Manipulations;Joint Manipulations;Dry needling;Manual techniques;Neuromuscular re-education;Therapeutic activities;DME Instruction;Traction;Ultrasound;Gait training;Moist Heat;Electrical Stimulation;Cryotherapy;Functional mobility training;Balance training;Patient/family education    PT Next Visit Plan HEP review, ankle stability, gait mechanics    PT Home Exercise Plan SLS, ankle alphabet, gait mechaincs     Consulted and Agree with Plan of Care Patient           Patient will benefit from skilled therapeutic intervention in order to improve the following deficits and impairments:  Abnormal gait, Decreased activity tolerance, Decreased endurance, Decreased strength, Improper body mechanics, Pain, Obesity, Postural dysfunction, Impaired flexibility, Hypermobility, Difficulty walking, Decreased mobility, Decreased balance  Visit Diagnosis: Pain in right ankle and joints of right foot  Stiffness of right ankle, not elsewhere classified  Other abnormalities of gait and mobility  Abnormal posture     Problem List Patient  Active Problem List   Diagnosis Date Noted  . Coronary atherosclerosis due to calcified coronary lesion 06/10/2020  . Aortic atherosclerosis ([place]) 06/10/2020  . Lung nodule seen on imaging study 06/10/2020  . Cyst of left kidney 05/29/2020  . Gallstone 05/29/2020  . Fatty liver 05/29/2020  . Bilateral leg edema 05/15/2020  . Venous stasis dermatitis of right lower extremity 05/15/2020  . Right ankle swelling 12/17/2019  . Memory loss 07/17/2019  . HLD (hyperlipidemia) 11/09/2018  . Stress 11/09/2018  . History of alcohol abuse 09/06/2018  . Chronic pain of right ankle 09/06/2018  . Vitamin D deficiency 06/29/2018  . Prediabetes 06/28/2018  . Plantar fasciitis 08/29/2016  . GERD (gastroesophageal reflux disease) 12/18/2015  . Restless legs 03/19/2015  . Essential hypertension 01/14/2014  . Obstructive sleep apnea 12/17/2013  . IBS (irritable bowel syndrome) 07/30/2013  . Insomnia 06/12/2013  . Cervical disc disorder with radiculopathy of cervical region 06/12/2013  . Morbid obesity with BMI of 50.0-59.9, adult ([place]) 06/12/2013  . Seasonal allergies 06/12/2013  . Anxiety and depression 06/12/2013   Durwin Reges DPT Durwin Reges 07/03/2020, 11:14 AM  [place] [facility] [address] [place], [address] Phone: [phone]   Fax:  [phone]  Name: Paul Cook MRN: [id number] Date of Birth: November 10, 1950

## 2020-07-06 ENCOUNTER — Other Ambulatory Visit: Payer: Self-pay

## 2020-07-06 ENCOUNTER — Encounter: Payer: Self-pay | Admitting: Physical Therapy

## 2020-07-06 ENCOUNTER — Telehealth: Payer: Self-pay | Admitting: Internal Medicine

## 2020-07-06 ENCOUNTER — Ambulatory Visit: Payer: PPO | Admitting: Physical Therapy

## 2020-07-06 ENCOUNTER — Other Ambulatory Visit: Payer: Self-pay | Admitting: Internal Medicine

## 2020-07-06 DIAGNOSIS — M25671 Stiffness of right ankle, not elsewhere classified: Secondary | ICD-10-CM

## 2020-07-06 DIAGNOSIS — M25571 Pain in right ankle and joints of right foot: Secondary | ICD-10-CM | POA: Diagnosis not present

## 2020-07-06 DIAGNOSIS — R2689 Other abnormalities of gait and mobility: Secondary | ICD-10-CM

## 2020-07-06 DIAGNOSIS — F419 Anxiety disorder, unspecified: Secondary | ICD-10-CM

## 2020-07-06 DIAGNOSIS — R293 Abnormal posture: Secondary | ICD-10-CM

## 2020-07-06 MED ORDER — DIAZEPAM 5 MG PO TABS: ORAL_TABLET | ORAL | 0 refills | Status: DC

## 2020-07-06 NOTE — Telephone Encounter (Signed)
Please advise 

## 2020-07-06 NOTE — Telephone Encounter (Signed)
Patient informed and verbalized understanding

## 2020-07-06 NOTE — Telephone Encounter (Signed)
Sent to [facility] Valium X 2 pills

## 2020-07-06 NOTE — Telephone Encounter (Signed)
Patient needs something to keep him calm for his MRI on Thursday, 07/09/2020. Requesting t 2 pills like the last time.

## 2020-07-06 NOTE — Therapy (Signed)
[place] [facility] [address], [place], [address] Phone: [phone]   Fax:  [phone]  Physical Therapy Treatment  Patient Details  Name: Paul Cook MRN: [id number] Date of Birth: [birth year] No data recorded  Encounter Date: 07/06/2020   PT End of Session - 07/06/20 1101    Visit Number 2    Number of Visits 17    Date for PT Re-Evaluation 08/28/20    PT Start Time 1037    PT Stop Time 1115    PT Time Calculation (min) 38 min    Activity Tolerance Patient tolerated treatment well    Behavior During Therapy [facility] for tasks assessed/performed           Past Medical History:  Diagnosis Date  . Adenomatous colon polyp    tubular  . Adenomatous rectal polyp    tubular  . Asthma   . Depression   . Diverticulosis   . GERD (gastroesophageal reflux disease)   . History of chicken pox   . Hyperlipidemia   . Hypertension   . Lung nodule    CT 10/2014  . OSA (obstructive sleep apnea)   . Sleep apnea    wears cpap  . Vitamin D deficiency     Past Surgical History:  Procedure Laterality Date  . COLONOSCOPY    . COLONOSCOPY W/ POLYPECTOMY  2015   [facility], benign  . HERNIA REPAIR     inguinal  . POLYPECTOMY    . TONSILLECTOMY     age 56     There were no vitals filed for this visit.   Subjective Assessment - 07/06/20 1039    Subjective Patient reports he had to do a lot of walking this weekend which went okay. Patient reports some compliance with HEP, and that he has been taking his brace off more. Reports no pain this am.    Pertinent History Pt is a 69 year old male presenting with R ankle pain over 5 months, reports he cannot remember an event that hurt him, but that he felt like he may have twisted it and it started wearing an ace bandage. Reports ace bandaging helps with ankle stability and pain. Reports he has pain below lateral mallelous and this pain feels like a sharp ache when he walks or stands  without ace bandage, and walking >23mins with strap on. Reports pain does not radiate, and denies sensation deficits. Reports he is retired and him and his wife watch a lot of TV. He reports he is able to drive and complete community errands, and that his wife completes all household chores, can bathe/dress ind. Worst pain over past week 4/10 best 1/10. Pt denies N/V, B&B changes, unexplained weight fluctuation, saddle paresthesia, fever, night sweats, or unrelenting night pain at this time.    Limitations Standing;House hold activities;Lifting    How long can you sit comfortably? unlimited    How long can you stand comfortably? 54mins    How long can you walk comfortably? 50mins    Diagnostic tests Xray April 2021 negative    Patient Stated Goals Decrease pain    Pain Onset More than a month ago               Ther-Ex Nustep L1 37mins seat 8 BLE only with cuing for DF "pull" and PF "push" for increased endurance and low level strengtheing BAPS L3 clockwise/counterclockwise x12 each direction with cuing for control and to touch entire  BAPS brim with some carry over R SLS with bilat 2 finger support 30sec hold R tandem stance without support 5sec; with unilateral 2 finger support 30sec Narrow BOS without UE support 30sec; with BUE flex with small theraball alt trunk rotations x12 Narrow BOS without UE support on foam pad 30sec; with BUE flex with small theraball alt trunk rotations x12 Mini squat bilat light touch for balance only x10 with good carry over of cuing to prevent midfoot supination/bilat knee valgus with good carry over; on foam 2 x10 with increased ankle strategy, continued good carry over of technique; standing on foam between sets Alt lateral stepping 4x 63ft with cuing to prevent ankle eversion and for control with decent carry over                    PT Education - 07/06/20 1101    Education Details HEP review, therex form/technique    Person(s) Educated  Patient    Methods Explanation;Demonstration;Verbal cues    Comprehension Verbalized understanding;Returned demonstration;Verbal cues required            PT Short Term Goals - 07/03/20 1059      PT SHORT TERM GOAL #1   Title Pt will be independent with HEP in order to decrease ankle pain and increase strength in order to improve pain-free function at home and work.    Baseline 07/03/20 HEP given    Time 4    Period Weeks    Status New             PT Long Term Goals - 07/03/20 1059      PT LONG TERM GOAL #1   Title Pt will decrease worst pain as reported on NPRS by at least 3 points in order to demonstrate clinically significant reduction in ankle/foot pain.    Baseline 07/03/20 4/10    Time 8    Period Weeks    Status New      PT LONG TERM GOAL #2   Title Patient will increase FOTO score to 65 to demonstrate predicted increase in functional mobility to complete ADLs    Baseline 07/03/20 52    Time 8    Period Weeks    Status New      PT LONG TERM GOAL #3   Title Patient will be able to demonstrate SLS of 10sec bilat to demonstrate reduced fall risk and increased ankle stability    Baseline 07/03/20 unable bilat    Time 8    Period Weeks    Status New                 Plan - 07/06/20 1101    Clinical Impression Statement PT initiated therex progression for increased ankle mobility and stability in weight bearing positions/functional movements with success. Patient is able to comply with all cuing for proper technique of therexwith good motivation and no increased pain throughout session. Pt needing frequent rests d/t muscle fatigue, and cardiovascular endurance, but able to complete all planned therex. PT will continue progression as able.    Personal Factors and Comorbidities Age;Comorbidity 1;Comorbidity 2;Comorbidity 3+;Fitness;Past/Current Experience;Time since onset of injury/illness/exacerbation    Comorbidities GERD, HTN, HLD    Examination-Activity  Limitations Lift;Locomotion Level;Squat;Transfers;Stairs;Stand    Examination-Participation Restrictions Community Activity;Driving;Cleaning;Yard Work    Stability/Clinical Decision Making Evolving/Moderate complexity    Clinical Decision Making Moderate    Rehab Potential Good    PT Frequency 2x / week    PT Duration 8  weeks    PT Treatment/Interventions ADLs/Self Care Home Management;Iontophoresis 4mg /ml Dexamethasone;Stair training;Therapeutic exercise;Passive range of motion;Spinal Manipulations;Joint Manipulations;Dry needling;Manual techniques;Neuromuscular re-education;Therapeutic activities;DME Instruction;Traction;Ultrasound;Gait training;Moist Heat;Electrical Stimulation;Cryotherapy;Functional mobility training;Balance training;Patient/family education    PT Next Visit Plan HEP review, ankle stability, gait mechanics    PT Home Exercise Plan SLS, ankle alphabet, gait mechaincs    Consulted and Agree with Plan of Care Patient           Patient will benefit from skilled therapeutic intervention in order to improve the following deficits and impairments:  Abnormal gait, Decreased activity tolerance, Decreased endurance, Decreased strength, Improper body mechanics, Pain, Obesity, Postural dysfunction, Impaired flexibility, Hypermobility, Difficulty walking, Decreased mobility, Decreased balance  Visit Diagnosis: Pain in right ankle and joints of right foot  Stiffness of right ankle, not elsewhere classified  Other abnormalities of gait and mobility  Abnormal posture     Problem List Patient Active Problem List   Diagnosis Date Noted  . Coronary atherosclerosis due to calcified coronary lesion 06/10/2020  . Aortic atherosclerosis ([place]) 06/10/2020  . Lung nodule seen on imaging study 06/10/2020  . Cyst of left kidney 05/29/2020  . Gallstone 05/29/2020  . Fatty liver 05/29/2020  . Bilateral leg edema 05/15/2020  . Venous stasis dermatitis of right lower extremity 05/15/2020   . Right ankle swelling 12/17/2019  . Memory loss 07/17/2019  . HLD (hyperlipidemia) 11/09/2018  . Stress 11/09/2018  . History of alcohol abuse 09/06/2018  . Chronic pain of right ankle 09/06/2018  . Vitamin D deficiency 06/29/2018  . Prediabetes 06/28/2018  . Plantar fasciitis 08/29/2016  . GERD (gastroesophageal reflux disease) 12/18/2015  . Restless legs 03/19/2015  . Essential hypertension 01/14/2014  . Obstructive sleep apnea 12/17/2013  . IBS (irritable bowel syndrome) 07/30/2013  . Insomnia 06/12/2013  . Cervical disc disorder with radiculopathy of cervical region 06/12/2013  . Morbid obesity with BMI of 50.0-59.9, adult ([place]) 06/12/2013  . Seasonal allergies 06/12/2013  . Anxiety and depression 06/12/2013   Durwin Reges DPT Durwin Reges 07/06/2020, 11:10 AM  [place] [facility] [address], [place], [address] Phone: [phone]   Fax:  [phone]  Name: Alexio Sroka MRN: [id number] Date of Birth: 02-Mar-1951

## 2020-07-07 ENCOUNTER — Encounter: Payer: PPO | Admitting: Physical Therapy

## 2020-07-08 ENCOUNTER — Encounter: Payer: Self-pay | Admitting: Physical Therapy

## 2020-07-08 ENCOUNTER — Other Ambulatory Visit: Payer: Self-pay

## 2020-07-08 ENCOUNTER — Ambulatory Visit: Payer: PPO | Admitting: Physical Therapy

## 2020-07-08 DIAGNOSIS — M25571 Pain in right ankle and joints of right foot: Secondary | ICD-10-CM

## 2020-07-08 DIAGNOSIS — R2689 Other abnormalities of gait and mobility: Secondary | ICD-10-CM

## 2020-07-08 DIAGNOSIS — R293 Abnormal posture: Secondary | ICD-10-CM

## 2020-07-08 DIAGNOSIS — M25671 Stiffness of right ankle, not elsewhere classified: Secondary | ICD-10-CM

## 2020-07-08 NOTE — Therapy (Signed)
[facility] [address], [place], [address] Phone: [phone]   Fax:  [phone]  Physical Therapy Treatment  Patient Details  Name: Paul Cook MRN: [id number] Date of Birth: 1951-05-10 No data recorded  Encounter Date: 07/08/2020   PT End of Session - 07/08/20 1306    Visit Number 3    Number of Visits 17    Date for PT Re-Evaluation 08/28/20    PT Start Time 0100    PT Stop Time 0140    PT Time Calculation (min) 40 min    Activity Tolerance Patient tolerated treatment well    Behavior During Therapy [facility] for tasks assessed/performed           Past Medical History:  Diagnosis Date   Adenomatous colon polyp    tubular   Adenomatous rectal polyp    tubular   Asthma    Depression    Diverticulosis    GERD (gastroesophageal reflux disease)    History of chicken pox    Hyperlipidemia    Hypertension    Lung nodule    CT 10/2014   OSA (obstructive sleep apnea)    Sleep apnea    wears cpap   Vitamin D deficiency     Past Surgical History:  Procedure Laterality Date   COLONOSCOPY     COLONOSCOPY W/ POLYPECTOMY  2015   [facility], benign   HERNIA REPAIR     inguinal   POLYPECTOMY     TONSILLECTOMY     age 53     There were no vitals filed for this visit.   Subjective Assessment - 07/08/20 1303    Subjective Pt reports he has not been wearing his ace bandage at home, which is an improvement. Reports only 1/10 pain today. COmpliance with HEP.    Pertinent History Pt is a 69 year old male presenting with R ankle pain over 5 months, reports he cannot remember an event that hurt him, but that he felt like he may have twisted it and it started wearing an ace bandage. Reports ace bandaging helps with ankle stability and pain. Reports he has pain below lateral mallelous and this pain feels like a sharp ache when he walks or stands without ace bandage, and walking >49mins with strap  on. Reports pain does not radiate, and denies sensation deficits. Reports he is retired and him and his wife watch a lot of TV. He reports he is able to drive and complete community errands, and that his wife completes all household chores, can bathe/dress ind. Worst pain over past week 4/10 best 1/10. Pt denies N/V, B&B changes, unexplained weight fluctuation, saddle paresthesia, fever, night sweats, or unrelenting night pain at this time.    Limitations Standing;House hold activities;Lifting    How long can you sit comfortably? unlimited    How long can you stand comfortably? 58mins    How long can you walk comfortably? 46mins    Diagnostic tests Xray April 2021 negative    Patient Stated Goals Decrease pain               Ther-Ex Nustep L1 40mins seat 8 BLE only with cuing for DF "pull" and PF "push" for increased endurance and low level strengtheing BAPS L3 clockwise/counterclockwise x15 each direction with cuing for control and to touch entire BAPS brim with some carry over Mini squat without UE support, light touch to mat table x10 with min cuing needed for  posture; on foam 2 x10 with increased ankle strategy, continued good carry over of technique; standing on foam between sets Narrow BOS with BUE flex with small theraball alt trunk rotations 2 x12 good ankle strategy bilat Alt lateral lunge 3x 10 with focus on return to start position with foot clearance for increased SL demand with good carry over Alt lateral stepping 2x 26ft; with YTB 2x 10 with cuing to prevent ankle eversion and for control with decent carry over      PT Education - 07/08/20 1306    Education Details therex technique/form    Person(s) Educated Patient    Methods Explanation;Demonstration;Verbal cues    Comprehension Verbalized understanding;Returned demonstration;Verbal cues required            PT Short Term Goals - 07/03/20 1059      PT SHORT TERM GOAL #1   Title Pt will be independent with HEP in  order to decrease ankle pain and increase strength in order to improve pain-free function at home and work.    Baseline 07/03/20 HEP given    Time 4    Period Weeks    Status New             PT Long Term Goals - 07/03/20 1059      PT LONG TERM GOAL #1   Title Pt will decrease worst pain as reported on NPRS by at least 3 points in order to demonstrate clinically significant reduction in ankle/foot pain.    Baseline 07/03/20 4/10    Time 8    Period Weeks    Status New      PT LONG TERM GOAL #2   Title Patient will increase FOTO score to 65 to demonstrate predicted increase in functional mobility to complete ADLs    Baseline 07/03/20 52    Time 8    Period Weeks    Status New      PT LONG TERM GOAL #3   Title Patient will be able to demonstrate SLS of 10sec bilat to demonstrate reduced fall risk and increased ankle stability    Baseline 07/03/20 unable bilat    Time 8    Period Weeks    Status New                 Plan - 07/08/20 1310    Clinical Impression Statement PT continued therex progression for increased ankle stability and strength with success. Patient is able to complete all therex with proper technique following cuing and demo, with good motivaiton throughout session, no increased pain throughout session. PT will continue progression as able.    Personal Factors and Comorbidities Age;Comorbidity 1;Comorbidity 2;Comorbidity 3+;Fitness;Past/Current Experience;Time since onset of injury/illness/exacerbation    Comorbidities GERD, HTN, HLD    Examination-Activity Limitations Lift;Locomotion Level;Squat;Transfers;Stairs;Stand    Examination-Participation Restrictions Community Activity;Driving;Cleaning;Yard Work    [employer] Evolving/Moderate complexity    Clinical Decision Making Moderate    Rehab Potential Good    PT Frequency 2x / week    PT Duration 8 weeks    PT Treatment/Interventions ADLs/Self Care Home Management;Iontophoresis  4mg /ml Dexamethasone;Stair training;Therapeutic exercise;Passive range of motion;Spinal Manipulations;Joint Manipulations;Dry needling;Manual techniques;Neuromuscular re-education;Therapeutic activities;DME Instruction;Traction;Ultrasound;Gait training;Moist Heat;Electrical Stimulation;Cryotherapy;Functional mobility training;Balance training;Patient/family education    PT Next Visit Plan functional movements, ankle stability, gait mechanics    PT Home Exercise Plan SLS, ankle alphabet, gait mechaincs    Consulted and Agree with Plan of Care Patient  Patient will benefit from skilled therapeutic intervention in order to improve the following deficits and impairments:  Abnormal gait, Decreased activity tolerance, Decreased endurance, Decreased strength, Improper body mechanics, Pain, Obesity, Postural dysfunction, Impaired flexibility, Hypermobility, Difficulty walking, Decreased mobility, Decreased balance  Visit Diagnosis: Pain in right ankle and joints of right foot  Stiffness of right ankle, not elsewhere classified  Other abnormalities of gait and mobility  Abnormal posture     Problem List Patient Active Problem List   Diagnosis Date Noted   Coronary atherosclerosis due to calcified coronary lesion 06/10/2020   Aortic atherosclerosis ([place]) 06/10/2020   Lung nodule seen on imaging study 06/10/2020   Cyst of left kidney 05/29/2020   Gallstone 05/29/2020   Fatty liver 05/29/2020   Bilateral leg edema 05/15/2020   Venous stasis dermatitis of right lower extremity 05/15/2020   Right ankle swelling 12/17/2019   Memory loss 07/17/2019   HLD (hyperlipidemia) 11/09/2018   Stress 11/09/2018   History of alcohol abuse 09/06/2018   Chronic pain of right ankle 09/06/2018   Vitamin D deficiency 06/29/2018   Prediabetes 06/28/2018   Plantar fasciitis 08/29/2016   GERD (gastroesophageal reflux disease) 12/18/2015   Restless legs 03/19/2015   Essential  hypertension 01/14/2014   Obstructive sleep apnea 12/17/2013   IBS (irritable bowel syndrome) 07/30/2013   Insomnia 06/12/2013   Cervical disc disorder with radiculopathy of cervical region 06/12/2013   Morbid obesity with BMI of 50.0-59.9, adult ([place]) 06/12/2013   Seasonal allergies 06/12/2013   Anxiety and depression 06/12/2013   Durwin Reges DPT Durwin Reges 07/08/2020, 1:38 PM  [facility] [address] [place], [address] Phone: [phone]   Fax:  [phone]  Name: Paul Cook MRN: [id number] Date of Birth: 10/15/1951

## 2020-07-09 DIAGNOSIS — K760 Fatty (change of) liver, not elsewhere classified: Secondary | ICD-10-CM | POA: Diagnosis not present

## 2020-07-09 DIAGNOSIS — R748 Abnormal levels of other serum enzymes: Secondary | ICD-10-CM | POA: Diagnosis not present

## 2020-07-09 DIAGNOSIS — K8020 Calculus of gallbladder without cholecystitis without obstruction: Secondary | ICD-10-CM | POA: Diagnosis not present

## 2020-07-09 DIAGNOSIS — N281 Cyst of kidney, acquired: Secondary | ICD-10-CM | POA: Diagnosis not present

## 2020-07-13 DIAGNOSIS — I2510 Atherosclerotic heart disease of native coronary artery without angina pectoris: Secondary | ICD-10-CM | POA: Insufficient documentation

## 2020-07-13 HISTORY — DX: Atherosclerotic heart disease of native coronary artery without angina pectoris: I25.10

## 2020-07-13 MED ORDER — GABAPENTIN 600 MG PO TABS: 300.0000 mg | ORAL_TABLET | Freq: Two times a day (BID) | ORAL | Status: DC

## 2020-07-14 ENCOUNTER — Ambulatory Visit: Payer: PPO | Admitting: Physical Therapy

## 2020-07-14 ENCOUNTER — Telehealth: Payer: Self-pay | Admitting: Internal Medicine

## 2020-07-14 NOTE — Telephone Encounter (Signed)
LVM to schedule 3-4 month f/u with PCP

## 2020-07-16 ENCOUNTER — Ambulatory Visit: Payer: PPO | Admitting: Physical Therapy

## 2020-07-16 ENCOUNTER — Telehealth: Payer: Self-pay | Admitting: Internal Medicine

## 2020-07-16 DIAGNOSIS — E559 Vitamin D deficiency, unspecified: Secondary | ICD-10-CM | POA: Diagnosis not present

## 2020-07-16 DIAGNOSIS — G4733 Obstructive sleep apnea (adult) (pediatric): Secondary | ICD-10-CM | POA: Diagnosis not present

## 2020-07-16 DIAGNOSIS — F329 Major depressive disorder, single episode, unspecified: Secondary | ICD-10-CM | POA: Diagnosis not present

## 2020-07-16 DIAGNOSIS — G4700 Insomnia, unspecified: Secondary | ICD-10-CM | POA: Diagnosis not present

## 2020-07-16 DIAGNOSIS — M25571 Pain in right ankle and joints of right foot: Secondary | ICD-10-CM | POA: Diagnosis not present

## 2020-07-16 DIAGNOSIS — I872 Venous insufficiency (chronic) (peripheral): Secondary | ICD-10-CM | POA: Diagnosis not present

## 2020-07-16 DIAGNOSIS — I700 Atherosclerosis of aorta: Secondary | ICD-10-CM | POA: Diagnosis not present

## 2020-07-16 DIAGNOSIS — I2510 Atherosclerotic heart disease of native coronary artery without angina pectoris: Secondary | ICD-10-CM | POA: Diagnosis not present

## 2020-07-16 DIAGNOSIS — I10 Essential (primary) hypertension: Secondary | ICD-10-CM | POA: Diagnosis not present

## 2020-07-16 DIAGNOSIS — F419 Anxiety disorder, unspecified: Secondary | ICD-10-CM | POA: Diagnosis not present

## 2020-07-16 DIAGNOSIS — G8929 Other chronic pain: Secondary | ICD-10-CM | POA: Diagnosis not present

## 2020-07-16 DIAGNOSIS — J302 Other seasonal allergic rhinitis: Secondary | ICD-10-CM | POA: Diagnosis not present

## 2020-07-16 DIAGNOSIS — E785 Hyperlipidemia, unspecified: Secondary | ICD-10-CM | POA: Diagnosis not present

## 2020-07-16 NOTE — Telephone Encounter (Signed)
Ok call back

## 2020-07-16 NOTE — Telephone Encounter (Signed)
Ok. Thanks!

## 2020-07-16 NOTE — Telephone Encounter (Signed)
Okay for verbal orders. 

## 2020-07-16 NOTE — Telephone Encounter (Signed)
Paul Cook Called back saying please disregard the message below

## 2020-07-16 NOTE — Telephone Encounter (Signed)
Mendel Ryder with [facility] called to get Verbal Orders for skilled nursing [phone]  1x 1wk 2x 1wk 1x 3wk   Also wanted to Add medical social work and PT

## 2020-07-16 NOTE — Telephone Encounter (Signed)
Paul Cook with [facility] called and states that pt told her that he has home health care but there is no prior authorization for that. She asked for you to please give her a call back @ [phone]

## 2020-07-17 DIAGNOSIS — G4700 Insomnia, unspecified: Secondary | ICD-10-CM | POA: Diagnosis not present

## 2020-07-17 DIAGNOSIS — F329 Major depressive disorder, single episode, unspecified: Secondary | ICD-10-CM | POA: Diagnosis not present

## 2020-07-17 DIAGNOSIS — I2510 Atherosclerotic heart disease of native coronary artery without angina pectoris: Secondary | ICD-10-CM | POA: Diagnosis not present

## 2020-07-17 DIAGNOSIS — G4733 Obstructive sleep apnea (adult) (pediatric): Secondary | ICD-10-CM | POA: Diagnosis not present

## 2020-07-17 DIAGNOSIS — F419 Anxiety disorder, unspecified: Secondary | ICD-10-CM | POA: Diagnosis not present

## 2020-07-17 DIAGNOSIS — E559 Vitamin D deficiency, unspecified: Secondary | ICD-10-CM | POA: Diagnosis not present

## 2020-07-17 DIAGNOSIS — I872 Venous insufficiency (chronic) (peripheral): Secondary | ICD-10-CM | POA: Diagnosis not present

## 2020-07-17 DIAGNOSIS — G8929 Other chronic pain: Secondary | ICD-10-CM | POA: Diagnosis not present

## 2020-07-17 DIAGNOSIS — J302 Other seasonal allergic rhinitis: Secondary | ICD-10-CM | POA: Diagnosis not present

## 2020-07-17 DIAGNOSIS — E785 Hyperlipidemia, unspecified: Secondary | ICD-10-CM | POA: Diagnosis not present

## 2020-07-17 DIAGNOSIS — I10 Essential (primary) hypertension: Secondary | ICD-10-CM | POA: Diagnosis not present

## 2020-07-17 DIAGNOSIS — M25571 Pain in right ankle and joints of right foot: Secondary | ICD-10-CM | POA: Diagnosis not present

## 2020-07-17 DIAGNOSIS — I700 Atherosclerosis of aorta: Secondary | ICD-10-CM | POA: Diagnosis not present

## 2020-07-17 NOTE — Telephone Encounter (Signed)
Left message on secure line with below information

## 2020-07-20 NOTE — Progress Notes (Signed)
I called and informed the patient to call [facility] for memory loss and gave him the number and I informed him to call Dr. Posey Pronto for his ankle and I gave him the number to that office as well and he understood.  ,cma

## 2020-07-21 ENCOUNTER — Telehealth: Payer: Self-pay | Admitting: Internal Medicine

## 2020-07-21 ENCOUNTER — Encounter: Payer: PPO | Admitting: Physical Therapy

## 2020-07-21 NOTE — Telephone Encounter (Signed)
Paul Cook with [facility] called to get Verbal Orders for Nursing  Call [phone]  1X 1 week  2X 1week 1X 3 weeks 3 PRN

## 2020-07-22 DIAGNOSIS — R4189 Other symptoms and signs involving cognitive functions and awareness: Secondary | ICD-10-CM | POA: Diagnosis not present

## 2020-07-22 DIAGNOSIS — Z9989 Dependence on other enabling machines and devices: Secondary | ICD-10-CM | POA: Diagnosis not present

## 2020-07-22 DIAGNOSIS — F1020 Alcohol dependence, uncomplicated: Secondary | ICD-10-CM | POA: Diagnosis not present

## 2020-07-22 DIAGNOSIS — G4733 Obstructive sleep apnea (adult) (pediatric): Secondary | ICD-10-CM | POA: Diagnosis not present

## 2020-07-22 NOTE — Telephone Encounter (Signed)
[place] for verbal Call back

## 2020-07-22 NOTE — Telephone Encounter (Signed)
Called and spoke to [place] at [facility] [place] and gave verbal orders. Sharee Pimple verbalized understanding and had no further questions.

## 2020-07-23 ENCOUNTER — Telehealth: Payer: Self-pay | Admitting: Internal Medicine

## 2020-07-23 ENCOUNTER — Encounter: Payer: PPO | Admitting: Physical Therapy

## 2020-07-23 NOTE — Telephone Encounter (Signed)
Pt called to get his MRI results

## 2020-07-23 NOTE — Telephone Encounter (Signed)
Left message to return call.  Looks as though Patient has not been scheduled for the MRI as of yet and these results are not in.

## 2020-07-23 NOTE — Telephone Encounter (Signed)
Left message to return call. Received verbal message from [facility]. When calling about her own patient she was informed that verbal orders have not been received for our patient. This was the only verbal encounter in Patient chart that was not resolved through direct contact.   Needing clarification that they received secured voicemail with okay for verbal orders.

## 2020-07-24 NOTE — Telephone Encounter (Signed)
  MRI 07/10/20  Fatty liver with liver cysts +  Gallstones + Kidney cysts not concerning for cancer   Diverticulosis   Arthritis in mid to lower back mostly lower mid back   TMS  Report  Abdomen:  Liver: Normal size and contour. Scattered T2 hyperintense hepatic lesions compatible with cysts. The largest in the left hepatic lobe measures 2.5 cm. Diffuse hepatic steatosis.  Gallbladder: Cholelithiasis. No pericholecystic fluid or inflammation.  Biliary: No intrahepatic or extrahepatic biliary dilation. No choledocholithiasis.  Spleen: Normal.  Pancreas: Normal.  Adrenals: Normal.  Kidneys: Multiple T2 hyperintense renal lesions bilaterally, the largest within the left interpolar kidney measures 5.3 x 3.1 cm with thin nonenhancing internal septations. No suspicious renal lesions. No hydronephrosis.  Stomach/bowel: No dilated bowel. Colonic diverticulosis without diverticulitis.  Peritoneum: No masses or ascites.  Nodes: No enlarged adenopathy.   Musculoskeletal: Degenerative changes of the thoracolumbar spine are most pronounced at the lower thoracic level.   CONCLUSION:    1.  Bilateral renal cysts with the largest in the left lower pole that is considered a Bosniak II and likely benign requiring no specific follow-up.  2.  Diffuse hepatic steatosis.  3.  Cholelithiasis without acute cholecystitis or choledocholithiasis.

## 2020-07-24 NOTE — Telephone Encounter (Signed)
Have you received these results? If not I will cal and request these be faxed over

## 2020-07-24 NOTE — Telephone Encounter (Signed)
I spoke with pt he had the MRI on 06/23/2020 in [place].

## 2020-07-27 ENCOUNTER — Telehealth: Payer: Self-pay | Admitting: Internal Medicine

## 2020-07-27 NOTE — Telephone Encounter (Signed)
Patient's wife calling in and was informed of results. She verbalized understanding.   She is wonder ing their is a certain diet Patient needs to go on to help his liver? Wanting to know if orders for this could possibly go to home health?   Please advise

## 2020-07-27 NOTE — Telephone Encounter (Signed)
[employer] for verbal  [facility]

## 2020-07-27 NOTE — Telephone Encounter (Signed)
Shanon Rosser from [facility] called in for verbal orders for patient for: 1 wk 1-, 2wk -2 , 1 wk -1, speech therapy requested also

## 2020-07-27 NOTE — Telephone Encounter (Signed)
Okay for verbal 

## 2020-07-27 NOTE — Telephone Encounter (Signed)
Okay for verbals given

## 2020-07-28 ENCOUNTER — Encounter: Payer: PPO | Admitting: Physical Therapy

## 2020-07-28 ENCOUNTER — Encounter: Payer: Self-pay | Admitting: Internal Medicine

## 2020-07-28 NOTE — Telephone Encounter (Signed)
Sent info re fatty liver my chart  No orders needed for home health Inform wife

## 2020-07-28 NOTE — Telephone Encounter (Signed)
Patient and his wife informed. They state they would like mychart deleted form the patient's chart. Wife states they have a hard time with computer and passwords. They currently do not have acces to any of their password protected accounts.   Mychart deactivated and communication preferences changed to phone/mail.  Mailed information to patient instead.

## 2020-07-30 ENCOUNTER — Encounter: Payer: PPO | Admitting: Physical Therapy

## 2020-07-30 ENCOUNTER — Telehealth: Payer: Self-pay | Admitting: Internal Medicine

## 2020-07-30 NOTE — Telephone Encounter (Signed)
Your patient whom you referred for the Medical Nutrition Therapy Program has not come as you had requested.  Please discuss this with your patient.  When he/she decides to commit to the program, please let us know.    Message left to call and schedule but patient has not called back. If your patient calls I will let him know his insurance will not cover these diagnosis. Per Medicare guidelines patient hs to have diabetes and/or renal disease to meet for Medical Nutrition Therapy.    Thank you for this referral, and if we can be of further assistance to you or your patient, let us know.     [place], [facility] Counseling  Administrative Clinical Assistant  Direct Dial: [phone]  Fax: [phone]  Website: [email or web address]

## 2020-07-31 NOTE — Telephone Encounter (Signed)
Sent as letter to the patient

## 2020-08-03 NOTE — Telephone Encounter (Signed)
Faxed to [facility] for professional communication for verbal order for speech therapy st frequency at 2w3 in order to increase functional independence,learn strategies to improve STM and executive function and reduce caregiver burden faxed on 08-03-20

## 2020-08-04 ENCOUNTER — Encounter: Payer: PPO | Admitting: Physical Therapy

## 2020-08-05 ENCOUNTER — Ambulatory Visit: Payer: PPO | Admitting: Internal Medicine

## 2020-08-05 ENCOUNTER — Telehealth: Payer: Self-pay | Admitting: Internal Medicine

## 2020-08-05 ENCOUNTER — Other Ambulatory Visit: Payer: Self-pay

## 2020-08-05 ENCOUNTER — Encounter: Payer: Self-pay | Admitting: Internal Medicine

## 2020-08-05 VITALS — BP 138/70 | HR 73 | Ht 66.0 in | Wt 311.0 lb

## 2020-08-05 DIAGNOSIS — R9431 Abnormal electrocardiogram [ECG] [EKG]: Secondary | ICD-10-CM

## 2020-08-05 DIAGNOSIS — R0609 Other forms of dyspnea: Secondary | ICD-10-CM

## 2020-08-05 DIAGNOSIS — I2510 Atherosclerotic heart disease of native coronary artery without angina pectoris: Secondary | ICD-10-CM

## 2020-08-05 DIAGNOSIS — R0600 Dyspnea, unspecified: Secondary | ICD-10-CM

## 2020-08-05 NOTE — Patient Instructions (Signed)
Medication Instructions:  Your physician recommends that you continue on your current medications as directed. Please refer to the Current Medication list given to you today.  *If you need a refill on your cardiac medications before your next appointment, please call your pharmacy*   Lab Work: none If you have labs (blood work) drawn today and your tests are completely normal, you will receive your results only by: Marland Kitchen MyChart Message (if you have MyChart) OR . A paper copy in the mail If you have any lab test that is abnormal or we need to change your treatment, we will call you to review the results.   Testing/Procedures: Your physician has requested that you have an echocardiogram. Echocardiography is a painless test that uses sound waves to create images of your heart. It provides your doctor with information about the size and shape of your heart and how well your heart's chambers and valves are working. This procedure takes approximately one hour. There are no restrictions for this procedure. You may get an IV, if needed, to receive an ultrasound enhancing agent through to better visualize your heart.   Follow-Up: At [facility], you and your health needs are our priority.  As part of our continuing mission to provide you with exceptional heart care, we have created designated Provider Care Teams.  These Care Teams include your primary Cardiologist (physician) and Advanced Practice Providers (APPs -  Physician Assistants and Nurse Practitioners) who all work together to provide you with the care you need, when you need it.  We recommend signing up for the patient portal called "MyChart".  Sign up information is provided on this After Visit Summary.  MyChart is used to connect with patients for Virtual Visits (Telemedicine).  Patients are able to view lab/test results, encounter notes, upcoming appointments, etc.  Non-urgent messages can be sent to your provider as well.   To learn more  about what you can do with MyChart, go to [email or web address].    Your next appointment:   3 month(s)  The format for your next appointment:   In Person  Provider:   You may see DR Harrell Gave END or one of the following Advanced Practice Providers on your designated Care Team:    Murray Hodgkins, NP  Christell Faith, PA-C  Marrianne Mood, PA-C  Cadence [place], [place]

## 2020-08-05 NOTE — Telephone Encounter (Signed)
Faxed to [facility] to Order Coordinator for patient evaluation after hours for muscle weakness, gait abnormality

## 2020-08-05 NOTE — Progress Notes (Signed)
New Outpatient Visit Date: 08/05/2020  Referring Provider: McLean-Scocuzza, [name], MD [place],  [place] [address]  Chief Complaint: Coronary artery calcification  HPI:  Paul Cook is a 69 y.o. male who is being seen today for the evaluation of incidentally noted coronary artery calcification on CT of the chest at the request of Dr. Terese Door. He has a history of tension, hyperlipidemia, obstructive sleep apnea, asthma, depression, and GERD.  Paul Cook reports that he has a history of exertional dyspnea dating back to his teens.  He feels like this has been stable for many years.  He denies chest pain, palpitations, and lightheadedness.  He has intermittent edema involving the right ankle, especially when he walks a lot.  He denies orthopnea.  He reports having had a stress test many years ago while living in [place] prior to hernia surgery.  He believes this was normal.  --------------------------------------------------------------------------------------------------  Cardiovascular History & Procedures: Cardiovascular Problems:  Coronary artery calcification  Risk Factors:  Coronary artery calcification, hypertension, hyperlipidemia, male gender, age greater than 33, and morbid obesity  Cath/PCI:  None  CV Surgery:  None  EP Procedures and Devices:  None  Non-Invasive Evaluation(s):  None available.  Patient reports normal stress test many years ago in [place].  Recent CV Pertinent Labs: Lab Results  Component Value Date   CHOL 180 01/20/2020   HDL 57.30 01/20/2020   LDLCALC 100 (H) 01/20/2020   TRIG 114.0 01/20/2020   CHOLHDL 3 01/20/2020   K 3.9 05/15/2020   K 3.8 09/05/2014   BUN 16 05/15/2020   BUN 18 09/05/2014   CREATININE 0.98 05/15/2020    --------------------------------------------------------------------------------------------------  Past Medical History:  Diagnosis Date  . Adenomatous colon polyp    tubular  .  Adenomatous rectal polyp    tubular  . Asthma   . Depression   . Diverticulosis   . GERD (gastroesophageal reflux disease)   . History of chicken pox   . Hyperlipidemia   . Hypertension   . Lung nodule    CT 10/2014  . OSA (obstructive sleep apnea)   . Sleep apnea    wears cpap  . Vitamin D deficiency     Past Surgical History:  Procedure Laterality Date  . COLONOSCOPY    . COLONOSCOPY W/ POLYPECTOMY  2015   [facility], benign  . HERNIA REPAIR     inguinal  . POLYPECTOMY    . TONSILLECTOMY     age 65     Current Meds  Medication Sig  . amLODipine (NORVASC) 5 MG tablet Take 1 tablet (5 mg total) by mouth daily.  Marland Kitchen aspirin 81 MG EC tablet Take by mouth.  . Cholecalciferol 50 MCG (2000 UT) CAPS Take 2,000 Units by mouth.  . diazepam (VALIUM) 5 MG tablet Take 5 mg 15 minutes before MRI then another if needed before MRI sch 06/15/20  . diclofenac Sodium (VOLTAREN) 1 % GEL Apply 2-4 g topically 4 (four) times daily.  Marland Kitchen dicyclomine (BENTYL) 10 MG capsule Take 1 capsule (10 mg total) by mouth 2 (two) times daily.  Marland Kitchen ezetimibe (ZETIA) 10 MG tablet Take 1 tablet (10 mg total) by mouth daily.  . famotidine (PEPCID) 20 MG tablet Take 1 tablet (20 mg total) by mouth daily.  Marland Kitchen gabapentin (NEURONTIN) 600 MG tablet Take 0.5 tablets (300 mg total) by mouth 2 (two) times daily. TAKE 1 TABLET(300 MG) BY MOUTH TWO TIMES DAILY  . lisinopril-hydrochlorothiazide (ZESTORETIC) 20-12.5 MG tablet Take 2 tablets  by mouth daily.  Marland Kitchen LORazepam (ATIVAN) 0.5 MG tablet Take 1-2 tablets (0.5-1 mg total) by mouth at bedtime as needed for anxiety.  . metoprolol succinate (TOPROL-XL) 25 MG 24 hr tablet Take 1 tablet (25 mg total) by mouth daily.  Marland Kitchen nystatin cream (MYCOSTATIN) Apply 1 application topically 2 (two) times daily.  Marland Kitchen rOPINIRole (REQUIP) 2 MG tablet Take 3 tablets (6 mg total) by mouth at bedtime.  . sertraline (ZOLOFT) 50 MG tablet Take 1 tablet (50 mg total) by mouth daily.  . trazodone (DESYREL) 300 MG  tablet Take 1 tablet (300 mg total) by mouth at bedtime.  . triamcinolone cream (KENALOG) 0.1 % Apply 1 application topically 2 (two) times daily. Prn legs for itching If needed    Allergies: Nsaids  Social History   Tobacco Use  . Smoking status: Never Smoker  . Smokeless tobacco: Never Used  Vaping Use  . Vaping Use: Never used  Substance Use Topics  . Alcohol use: Yes    Alcohol/week: 0.0 standard drinks    Comment: 2-3 beers daily  . Drug use: No    Family History  Problem Relation Age of Onset  . Depression Mother   . Hypertension Father   . Heart disease Father   . Stroke Father   . Cancer Maternal Grandmother        ovarian?  . Cancer Paternal Grandfather        prostate  . Prostate cancer Paternal Grandfather   . Colon cancer Neg Hx   . Colon polyps Neg Hx     Review of Systems: A 12-system review of systems was performed and was negative except as noted in the HPI.  --------------------------------------------------------------------------------------------------  Physical Exam: BP 138/70 (BP Location: Right Arm, Patient Position: Sitting, Cuff Size: Large)   Pulse 73   Ht 5\' 6"  (1.676 m)   Wt (!) 311 lb (141.1 kg)   SpO2 97%   BMI 50.20 kg/m   General: NAD.  Seated in a wheelchair. HEENT: No conjunctival pallor or scleral icterus. Facemask in place. Neck: Supple without lymphadenopathy, thyromegaly, JVD, or HJR, though evaluation is limited by body habitus.. No carotid bruit. Lungs: Normal work of breathing. Clear to auscultation bilaterally without wheezes or crackles. Heart: Regular rate and rhythm with occasional extrasystoles.  No murmurs, rubs, or gallops.  Unable to assess PMI due to body habitus. Abd: Bowel sounds present. Soft, NT/ND.  Unable to assess HSM due to body habitus. Ext: 1+ right and trace left pretibial edema.  Radial and pedal pulses are 2+ bilaterally. Skin: Warm and dry without rash. Neuro: CNIII-XII intact. Strength and  fine-touch sensation intact in upper and lower extremities bilaterally. Psych: Normal mood and affect.  EKG: Normal sinus rhythm with occasional PVCs.  Poor R wave progression noted.  Lab Results  Component Value Date   WBC 10.6 05/15/2020   HGB 16.0 05/15/2020   HCT 47.2 05/15/2020   MCV 90.2 05/15/2020   PLT 185 05/15/2020    Lab Results  Component Value Date   NA 140 05/15/2020   K 3.9 05/15/2020   CL 102 05/15/2020   CO2 29 05/15/2020   BUN 16 05/15/2020   CREATININE 0.98 05/15/2020   GLUCOSE 96 05/15/2020   ALT 52 (H) 05/15/2020    Lab Results  Component Value Date   CHOL 180 01/20/2020   HDL 57.30 01/20/2020   LDLCALC 100 (H) 01/20/2020   TRIG 114.0 01/20/2020   CHOLHDL 3 01/20/2020     --------------------------------------------------------------------------------------------------  ASSESSMENT AND PLAN: Coronary artery calcification, dyspnea on exertion, and abnormal EKG: Paul Cook has chronic exertional dyspnea dating back to his teenage years but is otherwise asymptomatic.  I have personally reviewed unenhanced CT of the chest in August, demonstrating scattered coronary artery calcification throughout the left coronary artery.  His EKG today shows poor R wave progression as well as occasional PVCs, which are nonspecific.  Given his limited functional capacity and multiple cardiac risk factors, I think it would be worthwhile to pursue further cardiac evaluation.  I have recommended that we begin with a transthoracic echocardiogram.  If this does not reveal any significant structural abnormalities, we will then proceed with pharmacologic myocardial perfusion stress testing, as Paul Cook is unable to exercise on a treadmill due to joint problems and exertional dyspnea.  I would favor a myocardial PET/CT at [facility], given the patient's morbid obesity (BMI greater than 50).  However, he seems reluctant to travel to [facility] for this.  We may therefore need to consider  pharmacologic MPI at [facility], potentially as a 2-day study.  If he has evidence of cardiomyopathy or regional wall motion abnormality by echo, we would instead need to proceed with cardiac catheterization.  We will defer medication changes today.  Follow-up: Return to clinic in 3 months.  Nelva Bush, MD 08/05/2020 2:21 PM

## 2020-08-06 ENCOUNTER — Encounter: Payer: Self-pay | Admitting: Internal Medicine

## 2020-08-06 ENCOUNTER — Encounter: Payer: PPO | Admitting: Physical Therapy

## 2020-08-06 DIAGNOSIS — R0609 Other forms of dyspnea: Secondary | ICD-10-CM | POA: Insufficient documentation

## 2020-08-06 DIAGNOSIS — R0600 Dyspnea, unspecified: Secondary | ICD-10-CM | POA: Insufficient documentation

## 2020-08-06 DIAGNOSIS — R9431 Abnormal electrocardiogram [ECG] [EKG]: Secondary | ICD-10-CM | POA: Insufficient documentation

## 2020-08-06 HISTORY — DX: Abnormal electrocardiogram (ECG) (EKG): R94.31

## 2020-08-06 HISTORY — DX: Other forms of dyspnea: R06.09

## 2020-08-11 ENCOUNTER — Encounter: Payer: PPO | Admitting: Physical Therapy

## 2020-08-12 ENCOUNTER — Other Ambulatory Visit: Payer: Self-pay | Admitting: Internal Medicine

## 2020-08-12 DIAGNOSIS — R0609 Other forms of dyspnea: Secondary | ICD-10-CM

## 2020-08-12 DIAGNOSIS — R0600 Dyspnea, unspecified: Secondary | ICD-10-CM

## 2020-08-13 ENCOUNTER — Encounter: Payer: PPO | Admitting: Physical Therapy

## 2020-08-17 DIAGNOSIS — I700 Atherosclerosis of aorta: Secondary | ICD-10-CM | POA: Diagnosis not present

## 2020-08-17 DIAGNOSIS — G4733 Obstructive sleep apnea (adult) (pediatric): Secondary | ICD-10-CM | POA: Diagnosis not present

## 2020-08-17 DIAGNOSIS — F419 Anxiety disorder, unspecified: Secondary | ICD-10-CM | POA: Diagnosis not present

## 2020-08-17 DIAGNOSIS — I10 Essential (primary) hypertension: Secondary | ICD-10-CM | POA: Diagnosis not present

## 2020-08-17 DIAGNOSIS — F329 Major depressive disorder, single episode, unspecified: Secondary | ICD-10-CM | POA: Diagnosis not present

## 2020-08-17 DIAGNOSIS — J302 Other seasonal allergic rhinitis: Secondary | ICD-10-CM | POA: Diagnosis not present

## 2020-08-17 DIAGNOSIS — I872 Venous insufficiency (chronic) (peripheral): Secondary | ICD-10-CM | POA: Diagnosis not present

## 2020-08-17 DIAGNOSIS — M25571 Pain in right ankle and joints of right foot: Secondary | ICD-10-CM | POA: Diagnosis not present

## 2020-08-17 DIAGNOSIS — G8929 Other chronic pain: Secondary | ICD-10-CM | POA: Diagnosis not present

## 2020-08-17 DIAGNOSIS — I2510 Atherosclerotic heart disease of native coronary artery without angina pectoris: Secondary | ICD-10-CM | POA: Diagnosis not present

## 2020-08-17 DIAGNOSIS — E559 Vitamin D deficiency, unspecified: Secondary | ICD-10-CM | POA: Diagnosis not present

## 2020-08-17 DIAGNOSIS — G4700 Insomnia, unspecified: Secondary | ICD-10-CM | POA: Diagnosis not present

## 2020-08-17 DIAGNOSIS — E785 Hyperlipidemia, unspecified: Secondary | ICD-10-CM | POA: Diagnosis not present

## 2020-08-20 NOTE — Telephone Encounter (Signed)
Faxed orders coordinator to [facility] for orders signed faxed on 08-20-2020

## 2020-08-25 ENCOUNTER — Other Ambulatory Visit: Payer: Self-pay

## 2020-08-25 ENCOUNTER — Telehealth: Payer: Self-pay | Admitting: Internal Medicine

## 2020-08-25 ENCOUNTER — Ambulatory Visit (INDEPENDENT_AMBULATORY_CARE_PROVIDER_SITE_OTHER): Payer: PPO

## 2020-08-25 DIAGNOSIS — R0600 Dyspnea, unspecified: Secondary | ICD-10-CM

## 2020-08-25 DIAGNOSIS — R0609 Other forms of dyspnea: Secondary | ICD-10-CM

## 2020-08-25 MED ORDER — PERFLUTREN LIPID MICROSPHERE: 1.0000 mL | INTRAVENOUS | Status: AC | PRN

## 2020-08-25 NOTE — Telephone Encounter (Signed)
Faxed [facility] Evaluation CAD Rankle pain faxed 08-25-2020

## 2020-08-25 NOTE — Telephone Encounter (Signed)
Faxed homhealth and plan of care to [facility]  To orders coordinator for certification and plan of care for 07-16-2020 to 09-13-2020 faxed on 08-25-2020.

## 2020-08-26 LAB — ECHOCARDIOGRAM COMPLETE
AR max vel: 2.2 cm2
AV Area VTI: 2.53 cm2
AV Area mean vel: 2.25 cm2
AV Mean grad: 5 mmHg
AV Peak grad: 9.7 mmHg
Ao pk vel: 1.56 m/s
Area-P 1/2: 2.93 cm2
S' Lateral: 2.5 cm

## 2020-08-28 ENCOUNTER — Telehealth: Payer: Self-pay | Admitting: *Deleted

## 2020-08-28 NOTE — Telephone Encounter (Signed)
-----   Message from Nelva Bush, MD sent at 08/28/2020  8:02 AM EST ----- Please let Paul Cook know that his echocardiogram shows that his heart is contracting normally.  There is mild thickening of the left ventricle, which may be due to his history of hypertension.  No significant valvular abnormality was identified.  As we discussed at our recent office visit, I recommend that we move forward with a myocardial PET/CT stress test at [facility] if he is able to travel to [facility].  If this is not feasible for Paul Cook, we can arrange for a Myoview at [facility], though the quality of the study may be reduced on account of Paul Cook morbid obesity.

## 2020-08-28 NOTE — Telephone Encounter (Signed)
Results called to pt. Pt verbalized understanding. Patient agreeable to have the PET/CT at [facility]. He is aware it will take a few days to get the procedure precerted. He will call me back in about 1 week if he does not here anything.  Message sent to precert to start the process on my end. Then will need to fax the order to [facility].

## 2020-09-02 NOTE — Telephone Encounter (Signed)
No precert required for this procedure. Order signed and completed by Dr End and faxed to [facility]. They will reach out to patient to schedule.

## 2020-09-09 ENCOUNTER — Telehealth: Payer: Self-pay | Admitting: Internal Medicine

## 2020-09-09 ENCOUNTER — Telehealth: Payer: Self-pay

## 2020-09-09 ENCOUNTER — Other Ambulatory Visit: Payer: Self-pay | Admitting: Internal Medicine

## 2020-09-09 DIAGNOSIS — E785 Hyperlipidemia, unspecified: Secondary | ICD-10-CM

## 2020-09-09 MED ORDER — EZETIMIBE 10 MG PO TABS: 10.0000 mg | ORAL_TABLET | Freq: Every day | ORAL | 3 refills | Status: DC

## 2020-09-09 NOTE — Telephone Encounter (Signed)
Refill sent he should be on this its for cholesterol  Happy Thanksgiving

## 2020-09-09 NOTE — Telephone Encounter (Signed)
Pt states that he needs a refill on ezetimibe (ZETIA) 10 MG tablet. He is unsure if he is still supposed to take this? Please advise

## 2020-09-09 NOTE — Telephone Encounter (Signed)
[facility] for [facility] health skilled nursing services since 07-16-2020 visit for sn discharge faxed on 09-09-2020

## 2020-09-18 DIAGNOSIS — M9903 Segmental and somatic dysfunction of lumbar region: Secondary | ICD-10-CM | POA: Diagnosis not present

## 2020-09-18 DIAGNOSIS — M9901 Segmental and somatic dysfunction of cervical region: Secondary | ICD-10-CM | POA: Diagnosis not present

## 2020-09-18 DIAGNOSIS — M9906 Segmental and somatic dysfunction of lower extremity: Secondary | ICD-10-CM | POA: Diagnosis not present

## 2020-09-18 DIAGNOSIS — M9902 Segmental and somatic dysfunction of thoracic region: Secondary | ICD-10-CM | POA: Diagnosis not present

## 2020-09-21 ENCOUNTER — Telehealth: Payer: Self-pay

## 2020-09-21 DIAGNOSIS — I10 Essential (primary) hypertension: Secondary | ICD-10-CM

## 2020-09-21 DIAGNOSIS — M9903 Segmental and somatic dysfunction of lumbar region: Secondary | ICD-10-CM | POA: Diagnosis not present

## 2020-09-21 DIAGNOSIS — M9902 Segmental and somatic dysfunction of thoracic region: Secondary | ICD-10-CM | POA: Diagnosis not present

## 2020-09-21 DIAGNOSIS — M9901 Segmental and somatic dysfunction of cervical region: Secondary | ICD-10-CM | POA: Diagnosis not present

## 2020-09-21 DIAGNOSIS — M9906 Segmental and somatic dysfunction of lower extremity: Secondary | ICD-10-CM | POA: Diagnosis not present

## 2020-09-21 MED ORDER — AMLODIPINE BESYLATE 5 MG PO TABS: 5.0000 mg | ORAL_TABLET | Freq: Every day | ORAL | 3 refills | Status: DC

## 2020-09-21 NOTE — Telephone Encounter (Signed)
Do not see results of procedure in CareEverywhere at this time. Attempted to call [facility] and they closed at 4 pm. Will try again later to see if they got the order.

## 2020-09-21 NOTE — Telephone Encounter (Signed)
Pt states that pharmacy would not give him a refill of amLODipine (NORVASC) 5 MG tablet

## 2020-09-22 ENCOUNTER — Telehealth: Payer: Self-pay | Admitting: Internal Medicine

## 2020-09-22 NOTE — Telephone Encounter (Signed)
Pt called part of his CPAP (system one REM* auto ADFLEX made by [employer]) has been recalled and they will not have a replacement for a year The mask part is not flowing air properly  Pt wanted to know what he needed to do

## 2020-09-22 NOTE — Telephone Encounter (Signed)
Please advise 

## 2020-09-22 NOTE — Telephone Encounter (Signed)
Spoke to [facility] scheduling. Patient is scheduled for 10/27/2020.

## 2020-09-25 DIAGNOSIS — M9903 Segmental and somatic dysfunction of lumbar region: Secondary | ICD-10-CM | POA: Diagnosis not present

## 2020-09-25 DIAGNOSIS — M9902 Segmental and somatic dysfunction of thoracic region: Secondary | ICD-10-CM | POA: Diagnosis not present

## 2020-09-25 DIAGNOSIS — M9901 Segmental and somatic dysfunction of cervical region: Secondary | ICD-10-CM | POA: Diagnosis not present

## 2020-09-25 DIAGNOSIS — M9906 Segmental and somatic dysfunction of lower extremity: Secondary | ICD-10-CM | POA: Diagnosis not present

## 2020-09-25 NOTE — Telephone Encounter (Signed)
Fax to Dr. Manuella Ghazi to help   07/22/2020 Office Visit [facility]   [place], [place] [address]   [place], [place], [place]   [facility]   [place], [place] [address]   [phone] (Work)   [phone] (Fax)   Cognitive impairment (Primary Dx);  OSA on CPAP;  Alcoholism (CMS-HCC)

## 2020-09-28 NOTE — Telephone Encounter (Signed)
Pt states that he was told that it was going to be a year before he could get new cpap-would like a phone call about what to do.

## 2020-09-28 NOTE — Telephone Encounter (Signed)
Faxed via [facility] routing

## 2020-09-29 NOTE — Telephone Encounter (Signed)
Patient is agreeable to pulmonology referral to get a new CPAP.  States that his current CPAP stopped working and has a recall on it. However, the replacement won't be in for a year.

## 2020-09-29 NOTE — Telephone Encounter (Signed)
Does Dr. Manuella Ghazi manage cpap?   Call Dr. Janyth Pupa office  Otherwise does he want to see pulmonary?

## 2020-09-29 NOTE — Telephone Encounter (Signed)
Please advise 

## 2020-09-30 DIAGNOSIS — M9901 Segmental and somatic dysfunction of cervical region: Secondary | ICD-10-CM | POA: Diagnosis not present

## 2020-09-30 DIAGNOSIS — M9906 Segmental and somatic dysfunction of lower extremity: Secondary | ICD-10-CM | POA: Diagnosis not present

## 2020-09-30 DIAGNOSIS — M9903 Segmental and somatic dysfunction of lumbar region: Secondary | ICD-10-CM | POA: Diagnosis not present

## 2020-09-30 DIAGNOSIS — M9902 Segmental and somatic dysfunction of thoracic region: Secondary | ICD-10-CM | POA: Diagnosis not present

## 2020-10-02 NOTE — Addendum Note (Signed)
Addended by: Orland Mustard on: 10/02/2020 01:51 PM   Modules accepted: Orders

## 2020-10-05 DIAGNOSIS — M9903 Segmental and somatic dysfunction of lumbar region: Secondary | ICD-10-CM | POA: Diagnosis not present

## 2020-10-05 DIAGNOSIS — M9906 Segmental and somatic dysfunction of lower extremity: Secondary | ICD-10-CM | POA: Diagnosis not present

## 2020-10-05 DIAGNOSIS — M9901 Segmental and somatic dysfunction of cervical region: Secondary | ICD-10-CM | POA: Diagnosis not present

## 2020-10-05 DIAGNOSIS — M9902 Segmental and somatic dysfunction of thoracic region: Secondary | ICD-10-CM | POA: Diagnosis not present

## 2020-10-08 DIAGNOSIS — M9903 Segmental and somatic dysfunction of lumbar region: Secondary | ICD-10-CM | POA: Diagnosis not present

## 2020-10-08 DIAGNOSIS — M9901 Segmental and somatic dysfunction of cervical region: Secondary | ICD-10-CM | POA: Diagnosis not present

## 2020-10-08 DIAGNOSIS — M9902 Segmental and somatic dysfunction of thoracic region: Secondary | ICD-10-CM | POA: Diagnosis not present

## 2020-10-08 DIAGNOSIS — M9906 Segmental and somatic dysfunction of lower extremity: Secondary | ICD-10-CM | POA: Diagnosis not present

## 2020-10-12 DIAGNOSIS — M9906 Segmental and somatic dysfunction of lower extremity: Secondary | ICD-10-CM | POA: Diagnosis not present

## 2020-10-12 DIAGNOSIS — M9903 Segmental and somatic dysfunction of lumbar region: Secondary | ICD-10-CM | POA: Diagnosis not present

## 2020-10-12 DIAGNOSIS — M9902 Segmental and somatic dysfunction of thoracic region: Secondary | ICD-10-CM | POA: Diagnosis not present

## 2020-10-12 DIAGNOSIS — M9901 Segmental and somatic dysfunction of cervical region: Secondary | ICD-10-CM | POA: Diagnosis not present

## 2020-10-13 ENCOUNTER — Telehealth: Payer: Self-pay | Admitting: Internal Medicine

## 2020-10-13 DIAGNOSIS — M5010 Cervical disc disorder with radiculopathy, unspecified cervical region: Secondary | ICD-10-CM

## 2020-10-13 MED ORDER — GABAPENTIN 600 MG PO TABS: 300.0000 mg | ORAL_TABLET | Freq: Two times a day (BID) | ORAL | Status: DC

## 2020-10-13 NOTE — Telephone Encounter (Signed)
Patient is requesting a refill on his gabapentin (NEURONTIN) 600 MG tablet

## 2020-10-26 ENCOUNTER — Telehealth: Payer: Self-pay | Admitting: Internal Medicine

## 2020-10-26 NOTE — Telephone Encounter (Signed)
Caryl Pina informed the pt several times about where to get tested. You could hear the pt's wife screaming at her through the phone. I asked if I could talk to her to possibly calm her down. I tried to explain to her that covid test results are taking about five days to get results back and she interrupted me (yelling) that she finds that very hard to believe and could not believe that our doctors would just let people die. I tried to explain to her that covid tests are not resulted in our office and that they are sent off. I also tried to explain to her that the labs are inundated with covid tests due to the rise in numbers. She continued to state that this was not true  and the pt was laughing loudly in the background. I asked her if I could put her on hold and Caryl Pina called Lindalou Hose. to handle the situation since she was not happy with the answers we were giving her.

## 2020-10-26 NOTE — Telephone Encounter (Signed)
Phone call transferred to RN clinical supervisor , Patient identified by name date of birth on phone patient ask protocol for testing, patient was advised our protocol if having symptoms or has known exposure , we conduct a virtual visit and patient can be scheduled same day testing, patient hen ask time it takes to get results advised 2-3 business days or more due to the high numbers of covid or could be shorter. Patient stated he has no symptoms and no known exposure. Patient wife in background screaming this is the sorriest excuse for a medical facility and for a country in the world. "Advised we can only follow protocol" patient wife grabs phone begins to scream loudly you should do something FXXXXXX complain do something I called my office and same questions [facility] in [place] and they are just as Sorry" You all are in the [facility] medical field you can do something, ( patient laughing in background at what is wife per his identification is saying) patient takes phone back and ask for information testing sites information was given. Patient disconnected call.

## 2020-10-26 NOTE — Telephone Encounter (Signed)
Pt and his wife called to get information on where to get tested for Covid. I gave them the names and location. Pt wife in the background starting asking question like how long it would take. I told her that it could take you to 5-6 days for the results because they aren't resulted here. She started yelling at me saying this is ridiculous and what was the point of even having a doctor if it was going to take that long.  Ailene Ravel walked back here to my desk and I handed her the phone

## 2020-10-27 NOTE — Telephone Encounter (Signed)
Patient calling to cancel stress test with [facility].     Patient aware nurse will be notified but he should call [facility] to cancel.

## 2020-10-28 NOTE — Telephone Encounter (Signed)
Called and spoke with the patient. States that he does not need to be tested, that he and his wife just wanted to know what to do in case he needed to be tested.   Informed him that due to the increase in sick patients and testing that it takes longer to result. Informed him that all testing is outsourced  to [employer] and due to increased testing it takes them some time to run and send out everything. Informed that it takes 3-7 days and unfortunately we can not do anything about that time. That hopefully it will go back to shorter times once this wave of COVID has passed.   Informed the Patient that if his wife were to call and speak with staff this way again that he will be dismissed form this office as a Patient. Informed him that this was unacceptable and we can not allow this kind of behavior. Patient states that that was his wife. I informed him that although she is not our Patient, she did this on his behalf and he can be dismissed if she does this again. Patient verbalized understanding States he will not allow her to grab the phone from him anymore.

## 2020-10-28 NOTE — Telephone Encounter (Signed)
I am sorry for this patients behavior and his wife  I do not typically have an issue  If she can be informed this is warning and next time dismissal for behavior Thank you

## 2020-10-30 ENCOUNTER — Telehealth: Payer: Self-pay

## 2020-10-30 ENCOUNTER — Other Ambulatory Visit: Payer: Self-pay

## 2020-10-30 ENCOUNTER — Ambulatory Visit (INDEPENDENT_AMBULATORY_CARE_PROVIDER_SITE_OTHER): Payer: PPO | Admitting: Adult Health

## 2020-10-30 ENCOUNTER — Encounter: Payer: Self-pay | Admitting: Adult Health

## 2020-10-30 DIAGNOSIS — G4733 Obstructive sleep apnea (adult) (pediatric): Secondary | ICD-10-CM | POA: Diagnosis not present

## 2020-10-30 DIAGNOSIS — Z6841 Body Mass Index (BMI) 40.0 and over, adult: Secondary | ICD-10-CM

## 2020-10-30 NOTE — Patient Instructions (Signed)
Order for new CPAP machine .  Once available restart CPAP At bedtime   Work on healthy weight  Do not drive if sleepy  Follow up in 3 months with CPAP download and As needed

## 2020-10-30 NOTE — Progress Notes (Signed)
Reviewed and agree with assessment/plan.   Vineet Sood, MD [facility] 10/30/2020, 6:19 PM Pager:  [phone]  

## 2020-10-30 NOTE — Progress Notes (Signed)
@Patient  ID: Paul Cook, male    DOB: Jul 24, 1951, 70 y.o.   MRN: [id number]  Chief Complaint  Patient presents with  . sleep consult    Referring provider: McLean-Scocuzza, [name] *  HPI: 70 year old male presents for initial sleep consult October 30, 2020.  Patient is presenting to establish for sleep apnea.  Patient was diagnosed with severe obstructive sleep apnea in February 2015 and started on CPAP. Medical history significant for hypertension  TEST/EVENTS :  Sleep study November 29, 2013 AHI 37/hour  10/30/2020 Initial Sleep Consult  Patient presents for a sleep consult to establish for obstructive sleep apnea.  Patient was diagnosed with obstructive sleep apnea in February 2015.  He underwent a sleep study that showed severe sleep apnea with AHI at 37/hour.  Patient was started on nocturnal CPAP.  Patient says he wears his CPAP each night.  Gets in about 4 to 6 hours each night.  Patient says it works well to keep his daytime sleepiness controlled.  And his snoring.  Unfortunately his CPAP machine stopped working 3 weeks ago.  He has been unable to get this fixed.  He is requesting that we order a new CPAP.  We have contacted his homecare company [employer] multiple times today and left several messages. Patient says since not being able to use his CPAP machine he has daytime sleepiness and fatigue.  Has restless sleep.  Epworth score is 5.  Patient says he has very loud snoring without his CPAP.  Patient says he has been using the [employer] CPAP machine.  Patient says he typically goes to bed about 11:49 at night.  Gets up around 10 AM.  Typically takes him about 30 minutes to fall asleep.  Says his weight has been up about 12 pounds over the last 2 years.  Gets in about 2 cups of coffee each day. Denies any sleep cataplexy or sleep paralysis.  Social history patient is married.  He is retired.  Has never smoked.  Drinks 2 beers a day.  No illegal drug use.  He is retired  from Animal nutritionist.  Says he is sedentary.  He is previously from [place].  Moved into the [place] in 2001.  He speaks both [place] and [place] well.  Family history is positive for asthma and heart disease in his father.  Allergies  Allergen Reactions  . Nsaids Other (See Comments)    GI Issues    Immunization History  Administered Date(s) Administered  . Fluad Quad(high Dose 65+) 07/22/2019, 07/01/2020  . Influenza Whole 06/19/2013  . Influenza, High Dose Seasonal PF 06/28/2018  . Influenza,inj,Quad PF,6+ Mos 07/15/2014, 07/18/2016  . Influenza-Unspecified 06/18/2015  . PFIZER SARS-COV-2 Vaccination 12/03/2019, 12/24/2019  . Pneumococcal Conjugate-13 03/30/2017  . Pneumococcal Polysaccharide-23 04/04/2018  . Tdap 04/17/2014  . Zoster 12/18/2013    Past Medical History:  Diagnosis Date  . Adenomatous colon polyp    tubular  . Adenomatous rectal polyp    tubular  . Asthma   . Depression   . Diverticulosis   . GERD (gastroesophageal reflux disease)   . History of chicken pox   . Hyperlipidemia   . Hypertension   . Lung nodule    CT 10/2014  . OSA (obstructive sleep apnea)   . Sleep apnea    wears cpap  . Vitamin D deficiency     Tobacco History: Social History   Tobacco Use  Smoking Status Never Smoker  Smokeless Tobacco Never Used   Counseling given:  Not Answered   Outpatient Medications Prior to Visit  Medication Sig Dispense Refill  . amLODipine (NORVASC) 5 MG tablet Take 1 tablet (5 mg total) by mouth daily. 90 tablet 3  . aspirin 81 MG EC tablet Take by mouth.    . Cholecalciferol 50 MCG (2000 UT) CAPS Take 2,000 Units by mouth.    . diazepam (VALIUM) 5 MG tablet Take 5 mg 15 minutes before MRI then another if needed before MRI sch 06/15/20 2 tablet 0  . diclofenac Sodium (VOLTAREN) 1 % GEL Apply 2-4 g topically 4 (four) times daily. 150 g 11  . dicyclomine (BENTYL) 10 MG capsule Take 1 capsule (10 mg total) by mouth 2 (two) times daily. 180 capsule 3   . ezetimibe (ZETIA) 10 MG tablet Take 1 tablet (10 mg total) by mouth daily. 90 tablet 3  . famotidine (PEPCID) 20 MG tablet Take 1 tablet (20 mg total) by mouth daily. 90 tablet 3  . gabapentin (NEURONTIN) 600 MG tablet Take 0.5 tablets (300 mg total) by mouth 2 (two) times daily. TAKE 1 TABLET(300 MG) BY MOUTH TWO TIMES DAILY    . lisinopril-hydrochlorothiazide (ZESTORETIC) 20-12.5 MG tablet Take 2 tablets by mouth daily. 180 tablet 3  . LORazepam (ATIVAN) 0.5 MG tablet Take 1-2 tablets (0.5-1 mg total) by mouth at bedtime as needed for anxiety. 60 tablet 2  . metoprolol succinate (TOPROL-XL) 25 MG 24 hr tablet Take 1 tablet (25 mg total) by mouth daily. 90 tablet 3  . nystatin cream (MYCOSTATIN) Apply 1 application topically 2 (two) times daily. 90 g 11  . rOPINIRole (REQUIP) 2 MG tablet Take 3 tablets (6 mg total) by mouth at bedtime. 270 tablet 1  . sertraline (ZOLOFT) 50 MG tablet Take 1 tablet (50 mg total) by mouth daily. 90 tablet 3  . trazodone (DESYREL) 300 MG tablet Take 1 tablet (300 mg total) by mouth at bedtime. 31 tablet 5  . triamcinolone cream (KENALOG) 0.1 % Apply 1 application topically 2 (two) times daily. Prn legs for itching If needed 454 g 0   No facility-administered medications prior to visit.     Review of Systems:   Constitutional:   No  weight loss, night sweats,  Fevers, chills, fatigue, or  lassitude.  HEENT:   No headaches,  Difficulty swallowing,  Tooth/dental problems, or  Sore throat,                No sneezing, itching, ear ache, nasal congestion, post nasal drip,   CV:  No chest pain,  Orthopnea, PND, swelling in lower extremities, anasarca, dizziness, palpitations, syncope.   GI  No heartburn, indigestion, abdominal pain, nausea, vomiting, diarrhea, change in bowel habits, loss of appetite, bloody stools.   Resp: No shortness of breath with exertion or at rest.  No excess mucus, no productive cough,  No non-productive cough,  No coughing up of blood.   No change in color of mucus.  No wheezing.  No chest wall deformity  Skin: no rash or lesions.  GU: no dysuria, change in color of urine, no urgency or frequency.  No flank pain, no hematuria   MS:  No joint pain or swelling.  No decreased range of motion.  No back pain.    Physical Exam  BP 134/78 (BP Location: Left Arm, Cuff Size: Large)   Pulse 72   Temp 98.2 F (36.8 C) (Temporal)   Ht 5\' 6"  (1.676 m)   Wt (!) 314 lb 12.8 oz (  142.8 kg)   SpO2 99%   BMI 50.81 kg/m   GEN: A/Ox3; pleasant , NAD, well nourished    HEENT:  [place]/AT,   NOSE-clear, THROAT-clear, no lesions, no postnasal drip or exudate noted.  Class III MP airway  NECK:  Supple w/ fair ROM; no JVD; normal carotid impulses w/o bruits; no thyromegaly or nodules palpated; no lymphadenopathy.    RESP  Clear  P & A; w/o, wheezes/ rales/ or rhonchi. no accessory muscle use, no dullness to percussion  CARD:  RRR, no m/r/g, no peripheral edema, pulses intact, no cyanosis or clubbing.  GI:   Soft & nt; nml bowel sounds; no organomegaly or masses detected.   Musco: Warm bil, no deformities or joint swelling noted.   Neuro: alert, no focal deficits noted.    Skin: Warm, no lesions or rashes    Lab Results:  CBC   BNP No results found for: BNP  ProBNP  Imaging: No results found.    No flowsheet data found.  No results found for: NITRICOXIDE      Assessment & Plan:   No problem-specific Assessment & Plan notes found for this encounter.     Rexene Edison, NP 10/30/2020

## 2020-10-30 NOTE — Telephone Encounter (Signed)
Called and spoke to [place] with [employer]. Paul Cook stated that patient's machine to too old to obtain DL via airview.  Last settings that [employer] has on file for patient are auto 6-20cm.

## 2020-10-30 NOTE — Assessment & Plan Note (Signed)
Healthy weight loss discussed 

## 2020-10-30 NOTE — Telephone Encounter (Signed)
Lm for [employer] to request cpap download.

## 2020-10-30 NOTE — Assessment & Plan Note (Signed)
Severe obstructive sleep apnea diagnosed in February 2015.  Patient endorses excellent compliance and good perceived control on CPAP.  Unfortunately his machine broke 3 weeks ago.  He needs a new CPAP machine.  We have tried to contact his DME company for pressure settings.  If unable to contact with starting out on 5 to 15 cm H2O.  With mask of choice. Patient education on sleep apnea and CPAP.  Reviewed. Educated on healthy sleep regimen We will have him return in 3 months with a CPAP download. Plan  Patient Instructions  Order for new CPAP machine .  Once available restart CPAP At bedtime   Work on healthy weight  Do not drive if sleepy  Follow up in 3 months with CPAP download and As needed

## 2020-11-03 NOTE — Telephone Encounter (Signed)
Okay thank you , may send orders for new CPAP per OV recs with same setting

## 2020-11-03 NOTE — Telephone Encounter (Signed)
cpap order has been placed to [employer].  Patient is aware and voiced his understanding.  Nothing further needed at this time.

## 2020-11-11 NOTE — Progress Notes (Deleted)
Cardiology Office Note    Date:  11/11/2020   ID:  Paul Cook, DOB 11/15/1950, MRN [id number]  PCP:  McLean-Scocuzza, [name], MD  Cardiologist:  Nelva Bush, MD  Electrophysiologist:  None   Chief Complaint: Follow up  History of Present Illness:   Paul Cook is a 70 y.o. male with history of coronary artery calcification, HTN, HLD, OSA on CPAP, morbid obesity, asthma, pulmonary nodule, depression, and GERD who presents for follow-up of CAD.  He was seen by a new patient by Dr. Saunders Revel on 08/05/2020 at the request of his PCP for incidentally noted coronary artery calcification on prior CT of the chest for follow-up of previously noted pulmonary nodule.  CT of the chest in 05/2020 showed coronary calcification in the LAD territory as well as mild atherosclerosis of the thoracic aorta.  In this setting he was evaluated by Dr. Saunders Revel in 07/2020, at which time he reported a history of exertional dyspnea that dated back to his teens and felt like this has been stable for many years.  He was without symptoms of angina.  I did note intermittent edema involving the right ankle, particularly when he ambulated a lot.  He reported a remote stress test while living in [place] prior to hernia surgery and believed that this was normal.  EKG at our visit showed sinus rhythm with occasional PVCs and poor R wave progression.  Given his long history of exertional dyspnea symptoms and abnormal EKG, he underwent echo in 08/2020 that showed an EF of 60-65%, mild LVH, normal RVSF and ventricular cavity size, mildly dilated right atrium, no significant valvular abnormalities, and an estimated right atrial pressure of 3 mmHg.  Given his normal echo, PET/CT stress test was advised at [facility], though he cancelled this.   ***   Labs independently reviewed: 04/2020 - HGB 16.0, PLT 185, BUN 16, SCr 0.98, potassium 3.9, albumin 4.3, AST 42 (improved from 59 in 01/2020), AST 52 (improved from 84 in 01/2020) 01/2020 - TC 180, TG  114, HDL 57, LDL 100, A1c 5.7 06/2019 - TSH normal  Past Medical History:  Diagnosis Date  . Adenomatous colon polyp    tubular  . Adenomatous rectal polyp    tubular  . Asthma   . Depression   . Diverticulosis   . GERD (gastroesophageal reflux disease)   . History of chicken pox   . Hyperlipidemia   . Hypertension   . Lung nodule    CT 10/2014  . OSA (obstructive sleep apnea)   . Sleep apnea    wears cpap  . Vitamin D deficiency     Past Surgical History:  Procedure Laterality Date  . COLONOSCOPY    . COLONOSCOPY W/ POLYPECTOMY  2015   [facility], benign  . HERNIA REPAIR     inguinal  . POLYPECTOMY    . TONSILLECTOMY     age 89     Current Medications: No outpatient medications have been marked as taking for the 11/19/20 encounter (Appointment) with Rise Mu, PA-C.    Allergies:   Nsaids   Social History   Socioeconomic History  . Marital status: Married    Spouse name: Not on file  . Number of children: Not on file  . Years of education: Not on file  . Highest education level: Not on file  Occupational History  . Not on file  Tobacco Use  . Smoking status: Never Smoker  . Smokeless tobacco: Never Used  Vaping Use  .  Vaping Use: Never used  Substance and Sexual Activity  . Alcohol use: Yes    Alcohol/week: 4.0 standard drinks    Types: 4 Cans of beer per week    Comment: Alcoholic in the past  . Drug use: No  . Sexual activity: Never  Other Topics Concern  . Not on file  Social History Narrative   Lives in [place] with wife, [name] 23 years as of 10/2018. 1 daughter in [place].      Work - Optician, dispensing, now Animal nutritionist      From [place] Strain: Not on [employer] Insecurity: Not on file  Transportation Needs: No Transportation Needs  . Lack of Transportation (Medical): No  . Lack of Transportation (Non-Medical): No  Physical Activity: Not on file  Stress: No Stress  Concern Present  . Feeling of Stress : Not at all  Social Connections: Moderately Isolated  . Frequency of Communication with Friends and Family: More than three times a week  . Frequency of Social Gatherings with Friends and Family: Never  . Attends Religious Services: Never  . Active Member of Clubs or Organizations: No  . Attends Archivist Meetings: Never  . Marital Status: Married     Family History:  The patient's family history includes Cancer in his maternal grandmother and paternal grandfather; Depression in his mother; Heart disease in his father; Hypertension in his father; Prostate cancer in his paternal grandfather; Stroke in his father. There is no history of Colon cancer or Colon polyps.  ROS:   ROS   EKGs/Labs/Other Studies Reviewed:    Studies reviewed were summarized above. The additional studies were reviewed today:  2D echo 08/2020: 1. Left ventricular ejection fraction, by estimation, is 60 to 65%. The  left ventricle has normal function. Left ventricular endocardial border  not optimally defined to evaluate regional wall motion. There is mild left  ventricular hypertrophy. Left  ventricular diastolic parameters are indeterminate.  2. Right ventricular systolic function is normal. The right ventricular  size is normal. Tricuspid regurgitation signal is inadequate for assessing  PA pressure.  3. Right atrial size was mildly dilated.  4. The mitral valve was not well visualized. No evidence of mitral valve  regurgitation. No evidence of mitral stenosis.  5. The aortic valve was not well visualized. Aortic valve regurgitation  is not visualized. No aortic stenosis is present.  6. The inferior vena cava is normal in size with greater than 50%  respiratory variability, suggesting right atrial pressure of 3 mmHg.   EKG:  EKG is ordered today.  The EKG ordered today demonstrates ***  Recent Labs: 05/15/2020: ALT 52; BUN 16; Creat 0.98; Hemoglobin  16.0; Platelets 185; Potassium 3.9; Pro B Natriuretic peptide (BNP) 13; Sodium 140  Recent Lipid Panel    Component Value Date/Time   CHOL 180 01/20/2020 0840   TRIG 114.0 01/20/2020 0840   HDL 57.30 01/20/2020 0840   CHOLHDL 3 01/20/2020 0840   VLDL 22.8 01/20/2020 0840   LDLCALC 100 (H) 01/20/2020 0840    PHYSICAL EXAM:    VS:  There were no vitals taken for this visit.  BMI: There is no height or weight on file to calculate BMI.  Physical Exam  Wt Readings from Last 3 Encounters:  10/30/20 (!) 314 lb 12.8 oz (142.8 kg)  08/05/20 (!) 311 lb (141.1 kg)  07/01/20 (!) 308 lb 9.6  oz (140 kg)     ASSESSMENT & PLAN:   1. CAD:  2. HTN: Blood pressure ***  3. HLD: LDL 100 from 01/2020 with mildly elevated LFTs in 04/2020 as outlined below.  4. Abnormal LFTs:   Disposition: F/u with Dr. Saunders Revel or an APP in ***.   Medication Adjustments/Labs and Tests Ordered: Current medicines are reviewed at length with the patient today.  Concerns regarding medicines are outlined above. Medication changes, Labs and Tests ordered today are summarized above and listed in the Patient Instructions accessible in Encounters.   Signed, Christell Faith, PA-C 11/11/2020 4:45 PM     [place] [address] [place],  [address] [phone]

## 2020-11-16 DIAGNOSIS — M9903 Segmental and somatic dysfunction of lumbar region: Secondary | ICD-10-CM | POA: Diagnosis not present

## 2020-11-16 DIAGNOSIS — M9902 Segmental and somatic dysfunction of thoracic region: Secondary | ICD-10-CM | POA: Diagnosis not present

## 2020-11-16 DIAGNOSIS — M9906 Segmental and somatic dysfunction of lower extremity: Secondary | ICD-10-CM | POA: Diagnosis not present

## 2020-11-16 DIAGNOSIS — M9901 Segmental and somatic dysfunction of cervical region: Secondary | ICD-10-CM | POA: Diagnosis not present

## 2020-11-19 ENCOUNTER — Ambulatory Visit: Payer: PPO | Admitting: Physician Assistant

## 2020-11-20 ENCOUNTER — Telehealth: Payer: Self-pay

## 2020-11-20 NOTE — Telephone Encounter (Signed)
Dr [name] McLean-Scocuzza does not fill CPAP orders. According to pulmonology note:   November 03, 2020  Linwood Dibbles, CMA      5:02 PM Note cpap order has been placed to [employer].  Patient is aware and voiced his understanding.  Nothing further needed at this time.

## 2020-11-20 NOTE — Telephone Encounter (Signed)
Linda with [employer] called and states that pt needs rx refill for cpap supplies. Please call @ [phone]

## 2020-11-22 ENCOUNTER — Other Ambulatory Visit: Payer: Self-pay | Admitting: Internal Medicine

## 2020-11-22 DIAGNOSIS — I10 Essential (primary) hypertension: Secondary | ICD-10-CM

## 2020-11-27 ENCOUNTER — Other Ambulatory Visit: Payer: Self-pay | Admitting: Internal Medicine

## 2020-11-27 DIAGNOSIS — I10 Essential (primary) hypertension: Secondary | ICD-10-CM

## 2020-12-08 DIAGNOSIS — M9901 Segmental and somatic dysfunction of cervical region: Secondary | ICD-10-CM | POA: Diagnosis not present

## 2020-12-08 DIAGNOSIS — M9903 Segmental and somatic dysfunction of lumbar region: Secondary | ICD-10-CM | POA: Diagnosis not present

## 2020-12-08 DIAGNOSIS — M9902 Segmental and somatic dysfunction of thoracic region: Secondary | ICD-10-CM | POA: Diagnosis not present

## 2020-12-08 DIAGNOSIS — M9906 Segmental and somatic dysfunction of lower extremity: Secondary | ICD-10-CM | POA: Diagnosis not present

## 2020-12-10 DIAGNOSIS — G4733 Obstructive sleep apnea (adult) (pediatric): Secondary | ICD-10-CM | POA: Diagnosis not present

## 2020-12-29 ENCOUNTER — Telehealth: Payer: Self-pay | Admitting: Internal Medicine

## 2020-12-29 DIAGNOSIS — M5010 Cervical disc disorder with radiculopathy, unspecified cervical region: Secondary | ICD-10-CM

## 2020-12-29 DIAGNOSIS — G2581 Restless legs syndrome: Secondary | ICD-10-CM

## 2020-12-29 MED ORDER — ROPINIROLE HCL 2 MG PO TABS: 6.0000 mg | ORAL_TABLET | Freq: Every day | ORAL | 1 refills | Status: DC

## 2020-12-29 MED ORDER — GABAPENTIN 600 MG PO TABS: 300.0000 mg | ORAL_TABLET | Freq: Two times a day (BID) | ORAL | Status: DC

## 2020-12-29 NOTE — Telephone Encounter (Signed)
Patient's pharmacy told patient to call for the following refills; rOPINIRole (REQUIP) 2 MG tablet and gabapentin (NEURONTIN) 600 MG tablet.

## 2020-12-29 NOTE — Telephone Encounter (Signed)
Refill sent.

## 2020-12-30 ENCOUNTER — Other Ambulatory Visit: Payer: Self-pay | Admitting: Internal Medicine

## 2020-12-30 DIAGNOSIS — M5010 Cervical disc disorder with radiculopathy, unspecified cervical region: Secondary | ICD-10-CM

## 2021-01-19 NOTE — Progress Notes (Deleted)
Cardiology Office Note    Date:  01/19/2021   ID:  Paul Cook, DOB 1951-07-24, MRN [id number]  PCP:  McLean-Scocuzza, [name], MD  Cardiologist:  Nelva Bush, MD  Electrophysiologist:  None   Chief Complaint: Follow up  History of Present Illness:   Paul Cook is a 70 y.o. male with history of coronary artery calcification, HTN, OSA, asthma, depression, and GERD who presents for follow up of prior recommended nuclear stress test.   He was seen by Dr. Saunders Revel, as a new patient in 07/2020 at the request of Dr. Terese Door, for incidentally noted coronary artery calcification noted on CT chest. At his visit with cardiology, he reported exertional dyspnea that dated back to his teenage years and felt was stable. He was without chest pain. He reported having undergone a stress test many years ago, while living in [place], prior to hernia surgery. EKG showed sinus rhythm with occasional PVCs and poor R wave progression. Echo in 08/2020 showed EF of 60-65%, mild LVH, indeterminate LV diastolic function parameters, normal RVSF and ventricular cavity size, mildly dilated right atrium, and no significant valvular abnormalities. Given his body habitus, it was recommended he undergo myocardial PET/CT at [facility], though he was reluctant to travel to [facility]. Follow up has been delayed with patient cancellations. Stress test has not been completed.   ***   Labs independently reviewed: 04/2020 - BNP 13, HGB 16, PLT 185, BUN 16, SCr 0.98, potassium 3.9, albumin 4.3, AST 42, ALT 52 01/2020 - TC 180, TG 114, HDL 57, LDL 100, A1c 5.7 12/2019 - TSH normal  Past Medical History:  Diagnosis Date  . Adenomatous colon polyp    tubular  . Adenomatous rectal polyp    tubular  . Asthma   . Depression   . Diverticulosis   . GERD (gastroesophageal reflux disease)   . History of chicken pox   . Hyperlipidemia   . Hypertension   . Lung nodule    CT 10/2014  . OSA (obstructive sleep apnea)   . Sleep  apnea    wears cpap  . Vitamin D deficiency     Past Surgical History:  Procedure Laterality Date  . COLONOSCOPY    . COLONOSCOPY W/ POLYPECTOMY  2015   [facility], benign  . HERNIA REPAIR     inguinal  . POLYPECTOMY    . TONSILLECTOMY     age 67     Current Medications: No outpatient medications have been marked as taking for the 01/20/21 encounter (Appointment) with Rise Mu, PA-C.    Allergies:   Nsaids   Social History   Socioeconomic History  . Marital status: Married    Spouse name: Not on file  . Number of children: Not on file  . Years of education: Not on file  . Highest education level: Not on file  Occupational History  . Not on file  Tobacco Use  . Smoking status: Never Smoker  . Smokeless tobacco: Never Used  Vaping Use  . Vaping Use: Never used  Substance and Sexual Activity  . Alcohol use: Yes    Alcohol/week: 4.0 standard drinks    Types: 4 Cans of beer per week    Comment: Alcoholic in the past  . Drug use: No  . Sexual activity: Never  Other Topics Concern  . Not on file  Social History Narrative   Lives in [place] with wife, Paul Cook 23 years as of 10/2018. 1 daughter in [place].  Work - Optician, dispensing, now Animal nutritionist      From [place] Strain: Not on [employer] Insecurity: Not on file  Transportation Needs: No Transportation Needs  . Lack of Transportation (Medical): No  . Lack of Transportation (Non-Medical): No  Physical Activity: Not on file  Stress: No Stress Concern Present  . Feeling of Stress : Not at all  Social Connections: Moderately Isolated  . Frequency of Communication with Friends and Family: More than three times a week  . Frequency of Social Gatherings with Friends and Family: Never  . Attends Religious Services: Never  . Active Member of Clubs or Organizations: No  . Attends Archivist Meetings: Never  . Marital Status: Married      Family History:  The patient's family history includes Cancer in his maternal grandmother and paternal grandfather; Depression in his mother; Heart disease in his father; Hypertension in his father; Prostate cancer in his paternal grandfather; Stroke in his father. There is no history of Colon cancer or Colon polyps.  ROS:   ROS   EKGs/Labs/Other Studies Reviewed:    Studies reviewed were summarized above. The additional studies were reviewed today: ***  EKG:  EKG is ordered today.  The EKG ordered today demonstrates ***  Recent Labs: 05/15/2020: ALT 52; BUN 16; Creat 0.98; Hemoglobin 16.0; Platelets 185; Potassium 3.9; Pro B Natriuretic peptide (BNP) 13; Sodium 140  Recent Lipid Panel    Component Value Date/Time   CHOL 180 01/20/2020 0840   TRIG 114.0 01/20/2020 0840   HDL 57.30 01/20/2020 0840   CHOLHDL 3 01/20/2020 0840   VLDL 22.8 01/20/2020 0840   LDLCALC 100 (H) 01/20/2020 0840    PHYSICAL EXAM:    VS:  There were no vitals taken for this visit.  BMI: There is no height or weight on file to calculate BMI.  Physical Exam  Wt Readings from Last 3 Encounters:  10/30/20 (!) 314 lb 12.8 oz (142.8 kg)  08/05/20 (!) 311 lb (141.1 kg)  07/01/20 (!) 308 lb 9.6 oz (140 kg)     ASSESSMENT & PLAN:   1. Coronary calcification with exertional dyspnea and history of abnormal EKG:  2. HTN: Blood pressure ***  Disposition: F/u with Dr. Saunders Revel or an APP in ***.   Medication Adjustments/Labs and Tests Ordered: Current medicines are reviewed at length with the patient today.  Concerns regarding medicines are outlined above. Medication changes, Labs and Tests ordered today are summarized above and listed in the Patient Instructions accessible in Encounters.   Signed, Christell Faith, PA-C 01/19/2021 9:08 AM     [place] [address] [place], [place] [address] [phone]

## 2021-01-20 ENCOUNTER — Ambulatory Visit: Payer: PPO | Admitting: Physician Assistant

## 2021-01-22 ENCOUNTER — Other Ambulatory Visit: Payer: Self-pay

## 2021-01-22 ENCOUNTER — Encounter: Payer: Self-pay | Admitting: Podiatry

## 2021-01-22 ENCOUNTER — Ambulatory Visit: Payer: PPO | Admitting: Podiatry

## 2021-01-22 DIAGNOSIS — R600 Localized edema: Secondary | ICD-10-CM

## 2021-01-22 DIAGNOSIS — M659 Synovitis and tenosynovitis, unspecified: Secondary | ICD-10-CM

## 2021-01-22 DIAGNOSIS — M19071 Primary osteoarthritis, right ankle and foot: Secondary | ICD-10-CM | POA: Diagnosis not present

## 2021-01-22 DIAGNOSIS — M19079 Primary osteoarthritis, unspecified ankle and foot: Secondary | ICD-10-CM

## 2021-01-22 MED ORDER — BETAMETHASONE SOD PHOS & ACET 6 (3-3) MG/ML IJ SUSP
3.0000 mg | Freq: Once | INTRAMUSCULAR | Status: AC
  Administered 2021-01-22: 3 mg via INTRA_ARTICULAR

## 2021-01-22 NOTE — Progress Notes (Signed)
   Subjective:  70 y.o. male presenting today for evaluation of bilateral pain to the ankles.  Patient states that he was doing well until approximately 2 weeks ago when he noticed increased swelling and pain to the ankles.  This is a new complaint.  He was last seen in the office by me approximately 1 year ago where steroid injections were administered and he felt significant relief.  He presents for further treatment and evaluation   Past Medical History:  Diagnosis Date  . Adenomatous colon polyp    tubular  . Adenomatous rectal polyp    tubular  . Asthma   . Depression   . Diverticulosis   . GERD (gastroesophageal reflux disease)   . History of chicken pox   . Hyperlipidemia   . Hypertension   . Lung nodule    CT 10/2014  . OSA (obstructive sleep apnea)   . Sleep apnea    wears cpap  . Vitamin D deficiency      Objective / Physical Exam:  General:  The patient is alert and oriented x3 in no acute distress. Dermatology:  Skin is warm, dry and supple bilateral lower extremities. Negative for open lesions or macerations. Vascular:  Palpable pedal pulses bilaterally. No edema or erythema noted. Capillary refill within normal limits. Neurological:  Epicritic and protective threshold grossly intact bilaterally.  Musculoskeletal Exam:  Pain on palpation to the anterior lateral medial aspects of the patient's bilateral ankles. Mild edema noted. Range of motion within normal limits to all pedal and ankle joints bilateral. Muscle strength 5/5 in all groups bilateral.   Assessment: 1.   Plan of Care:  1. Patient was evaluated. X-Rays reviewed.  2. Injection of 0.5 mL Celestone Soluspan injected in the patient's bilateral ankle joints.  3.  Ankle brace is dispensed bilateral.  Wear daily 4.  Recommend good supportive shoes that support the structures of the foot 5.  Return to clinic as needed  Edrick Kins, DPM [facility]  Dr. Edrick Kins, DPM    [address], [place] [address]                Office [phone]  Fax [phone]

## 2021-02-23 DIAGNOSIS — M9906 Segmental and somatic dysfunction of lower extremity: Secondary | ICD-10-CM | POA: Diagnosis not present

## 2021-02-23 DIAGNOSIS — M9902 Segmental and somatic dysfunction of thoracic region: Secondary | ICD-10-CM | POA: Diagnosis not present

## 2021-02-23 DIAGNOSIS — M9903 Segmental and somatic dysfunction of lumbar region: Secondary | ICD-10-CM | POA: Diagnosis not present

## 2021-02-23 DIAGNOSIS — M9901 Segmental and somatic dysfunction of cervical region: Secondary | ICD-10-CM | POA: Diagnosis not present

## 2021-03-01 DIAGNOSIS — M9906 Segmental and somatic dysfunction of lower extremity: Secondary | ICD-10-CM | POA: Diagnosis not present

## 2021-03-01 DIAGNOSIS — M9901 Segmental and somatic dysfunction of cervical region: Secondary | ICD-10-CM | POA: Diagnosis not present

## 2021-03-01 DIAGNOSIS — M9903 Segmental and somatic dysfunction of lumbar region: Secondary | ICD-10-CM | POA: Diagnosis not present

## 2021-03-01 DIAGNOSIS — M9902 Segmental and somatic dysfunction of thoracic region: Secondary | ICD-10-CM | POA: Diagnosis not present

## 2021-03-08 DIAGNOSIS — M9906 Segmental and somatic dysfunction of lower extremity: Secondary | ICD-10-CM | POA: Diagnosis not present

## 2021-03-08 DIAGNOSIS — M9903 Segmental and somatic dysfunction of lumbar region: Secondary | ICD-10-CM | POA: Diagnosis not present

## 2021-03-08 DIAGNOSIS — M9901 Segmental and somatic dysfunction of cervical region: Secondary | ICD-10-CM | POA: Diagnosis not present

## 2021-03-08 DIAGNOSIS — M9902 Segmental and somatic dysfunction of thoracic region: Secondary | ICD-10-CM | POA: Diagnosis not present

## 2021-03-17 DIAGNOSIS — M9901 Segmental and somatic dysfunction of cervical region: Secondary | ICD-10-CM | POA: Diagnosis not present

## 2021-03-17 DIAGNOSIS — M9906 Segmental and somatic dysfunction of lower extremity: Secondary | ICD-10-CM | POA: Diagnosis not present

## 2021-03-17 DIAGNOSIS — M9903 Segmental and somatic dysfunction of lumbar region: Secondary | ICD-10-CM | POA: Diagnosis not present

## 2021-03-17 DIAGNOSIS — M9902 Segmental and somatic dysfunction of thoracic region: Secondary | ICD-10-CM | POA: Diagnosis not present

## 2021-03-18 DIAGNOSIS — M9902 Segmental and somatic dysfunction of thoracic region: Secondary | ICD-10-CM | POA: Diagnosis not present

## 2021-03-18 DIAGNOSIS — M9906 Segmental and somatic dysfunction of lower extremity: Secondary | ICD-10-CM | POA: Diagnosis not present

## 2021-03-18 DIAGNOSIS — M9903 Segmental and somatic dysfunction of lumbar region: Secondary | ICD-10-CM | POA: Diagnosis not present

## 2021-03-18 DIAGNOSIS — M9901 Segmental and somatic dysfunction of cervical region: Secondary | ICD-10-CM | POA: Diagnosis not present

## 2021-03-22 DIAGNOSIS — M9901 Segmental and somatic dysfunction of cervical region: Secondary | ICD-10-CM | POA: Diagnosis not present

## 2021-03-22 DIAGNOSIS — M9906 Segmental and somatic dysfunction of lower extremity: Secondary | ICD-10-CM | POA: Diagnosis not present

## 2021-03-22 DIAGNOSIS — M9903 Segmental and somatic dysfunction of lumbar region: Secondary | ICD-10-CM | POA: Diagnosis not present

## 2021-03-22 DIAGNOSIS — M9902 Segmental and somatic dysfunction of thoracic region: Secondary | ICD-10-CM | POA: Diagnosis not present

## 2021-03-25 DIAGNOSIS — M9901 Segmental and somatic dysfunction of cervical region: Secondary | ICD-10-CM | POA: Diagnosis not present

## 2021-03-25 DIAGNOSIS — M9903 Segmental and somatic dysfunction of lumbar region: Secondary | ICD-10-CM | POA: Diagnosis not present

## 2021-03-25 DIAGNOSIS — M9906 Segmental and somatic dysfunction of lower extremity: Secondary | ICD-10-CM | POA: Diagnosis not present

## 2021-03-25 DIAGNOSIS — M9902 Segmental and somatic dysfunction of thoracic region: Secondary | ICD-10-CM | POA: Diagnosis not present

## 2021-04-08 ENCOUNTER — Other Ambulatory Visit: Payer: Self-pay | Admitting: Internal Medicine

## 2021-04-08 ENCOUNTER — Telehealth: Payer: Self-pay | Admitting: Internal Medicine

## 2021-04-08 DIAGNOSIS — K589 Irritable bowel syndrome without diarrhea: Secondary | ICD-10-CM

## 2021-04-08 DIAGNOSIS — G2581 Restless legs syndrome: Secondary | ICD-10-CM

## 2021-04-08 MED ORDER — DICYCLOMINE HCL 10 MG PO CAPS: 10.0000 mg | ORAL_CAPSULE | Freq: Two times a day (BID) | ORAL | 3 refills | Status: DC

## 2021-04-08 NOTE — Telephone Encounter (Signed)
Pt needs a refill on dicyclomine (BENTYL) 10 MG capsule sent to [facility]

## 2021-04-20 ENCOUNTER — Ambulatory Visit: Payer: PPO | Admitting: Podiatry

## 2021-04-26 ENCOUNTER — Ambulatory Visit: Payer: PPO

## 2021-05-01 ENCOUNTER — Other Ambulatory Visit: Payer: Self-pay | Admitting: Internal Medicine

## 2021-05-01 DIAGNOSIS — I10 Essential (primary) hypertension: Secondary | ICD-10-CM

## 2021-05-05 ENCOUNTER — Ambulatory Visit: Payer: PPO

## 2021-05-11 ENCOUNTER — Ambulatory Visit: Payer: PPO | Admitting: Adult Health

## 2021-05-13 ENCOUNTER — Other Ambulatory Visit: Payer: Self-pay

## 2021-05-13 ENCOUNTER — Ambulatory Visit (INDEPENDENT_AMBULATORY_CARE_PROVIDER_SITE_OTHER): Payer: PPO | Admitting: Podiatry

## 2021-05-13 DIAGNOSIS — R601 Generalized edema: Secondary | ICD-10-CM | POA: Diagnosis not present

## 2021-05-14 NOTE — Progress Notes (Signed)
Subjective:  Patient ID: Paul Cook, male    DOB: Sep 08, 1951,  MRN: [id number]  Chief Complaint  Patient presents with   Foot Pain    PT stated that he is still having issues with his ankles swelling and causing discomfort     70 y.o. male presents with the above complaint.  Patient presents with complaint of bilateral generalized ankle edema.  Patient states that he started get swelling in both of the ankle.  He has not had any pain associated with it.  He has discomfort.  He states is sometimes hard with ambulation but mostly is worried about swelling.  He would like to know if there is any ways to prevent swelling he denies any other acute complaints he has not seen anyone else prior to seeing me.   Review of Systems: Negative except as noted in the HPI. Denies N/V/F/Ch.  Past Medical History:  Diagnosis Date   Adenomatous colon polyp    tubular   Adenomatous rectal polyp    tubular   Asthma    Depression    Diverticulosis    GERD (gastroesophageal reflux disease)    History of chicken pox    Hyperlipidemia    Hypertension    Lung nodule    CT 10/2014   OSA (obstructive sleep apnea)    Sleep apnea    wears cpap   Vitamin D deficiency     Current Outpatient Medications:    amLODipine (NORVASC) 5 MG tablet, Take 1 tablet (5 mg total) by mouth daily., Disp: 90 tablet, Rfl: 3   aspirin 81 MG EC tablet, Take by mouth., Disp: , Rfl:    Cholecalciferol 50 MCG (2000 UT) CAPS, Take 2,000 Units by mouth., Disp: , Rfl:    diazepam (VALIUM) 5 MG tablet, Take 5 mg 15 minutes before MRI then another if needed before MRI sch 06/15/20, Disp: 2 tablet, Rfl: 0   diclofenac Sodium (VOLTAREN) 1 % GEL, Apply 2-4 g topically 4 (four) times daily., Disp: 150 g, Rfl: 11   dicyclomine (BENTYL) 10 MG capsule, Take 1 capsule (10 mg total) by mouth 2 (two) times daily., Disp: 180 capsule, Rfl: 3   ezetimibe (ZETIA) 10 MG tablet, Take 1 tablet (10 mg total) by mouth daily., Disp: 90 tablet, Rfl: 3    famotidine (PEPCID) 20 MG tablet, Take 1 tablet (20 mg total) by mouth daily., Disp: 90 tablet, Rfl: 3   gabapentin (NEURONTIN) 600 MG tablet, TAKE 1 TABLET(600 MG) BY MOUTH THREE TIMES DAILY, Disp: 270 tablet, Rfl: 1   lisinopril-hydrochlorothiazide (ZESTORETIC) 20-12.5 MG tablet, TAKE 2 TABLETS BY MOUTH DAILY, Disp: 180 tablet, Rfl: 3   lisinopril-hydrochlorothiazide (ZESTORETIC) 20-12.5 MG tablet, TAKE 2 TABLETS BY MOUTH DAILY, Disp: 180 tablet, Rfl: 3   LORazepam (ATIVAN) 0.5 MG tablet, Take 1-2 tablets (0.5-1 mg total) by mouth at bedtime as needed for anxiety., Disp: 60 tablet, Rfl: 2   metoprolol succinate (TOPROL-XL) 25 MG 24 hr tablet, TAKE 1 TABLET(25 MG) BY MOUTH DAILY, Disp: 90 tablet, Rfl: 3   metoprolol succinate (TOPROL-XL) 25 MG 24 hr tablet, TAKE 1 TABLET(25 MG) BY MOUTH DAILY, Disp: 90 tablet, Rfl: 3   nystatin cream (MYCOSTATIN), Apply 1 application topically 2 (two) times daily., Disp: 90 g, Rfl: 11   rOPINIRole (REQUIP) 2 MG tablet, TAKE 3 TABLETS(6 MG) BY MOUTH AT BEDTIME, Disp: 270 tablet, Rfl: 1   trazodone (DESYREL) 300 MG tablet, Take 1 tablet (300 mg total) by mouth at bedtime., Disp: 31 tablet,  Rfl: 5  Social History   Tobacco Use  Smoking Status Never  Smokeless Tobacco Never    Allergies  Allergen Reactions   Nsaids Other (See Comments)    GI Issues   Objective:  There were no vitals filed for this visit. There is no height or weight on file to calculate BMI. Constitutional Well developed. Well nourished.  Vascular Dorsalis pedis pulses palpable bilaterally. Posterior tibial pulses palpable bilaterally. Capillary refill normal to all digits.  No cyanosis or clubbing noted. Pedal hair growth normal.  Neurologic Normal speech. Oriented to person, place, and time. Epicritic sensation to light touch grossly present bilaterally.  Dermatologic Generalized edema nonpitting noted to bilateral ankle.  No focal point of tenderness noted.  No pain with range of  motion of the ankle joint no pain at the Achilles tendon, peroneal tendon, ATFL ligament, posterior tibial tendon bilaterally.  Orthopedic: Normal joint ROM without pain or crepitus bilaterally. No visible deformities. No bony tenderness.   Radiographs: None Assessment:   1. Moderate generalized edema    Plan:  Patient was evaluated and treated and all questions answered.  Generalized edema ankle -I explained the patient etiology of edema and various treatment options were extensively discussed.  I ultimately discussed with the patient the benefit of compression socks as well as elevation.  I encouraged him to elevate aggressively to the level of the heart.  He states understanding will do so.  If he continues to cause him to have ankle I discussed with him to talk to his primary care physician and he may benefit from being placed on medication to Prevent swelling.  Patient states understanding  No follow-ups on file.

## 2021-06-10 ENCOUNTER — Other Ambulatory Visit: Payer: Self-pay | Admitting: Internal Medicine

## 2021-06-10 ENCOUNTER — Ambulatory Visit: Payer: PPO | Admitting: Podiatry

## 2021-06-10 DIAGNOSIS — M5010 Cervical disc disorder with radiculopathy, unspecified cervical region: Secondary | ICD-10-CM

## 2021-06-17 ENCOUNTER — Ambulatory Visit: Payer: PPO

## 2021-06-17 ENCOUNTER — Ambulatory Visit: Payer: PPO | Admitting: Podiatry

## 2021-06-17 ENCOUNTER — Other Ambulatory Visit: Payer: Self-pay

## 2021-06-17 DIAGNOSIS — Q666 Other congenital valgus deformities of feet: Secondary | ICD-10-CM | POA: Diagnosis not present

## 2021-06-17 DIAGNOSIS — M76821 Posterior tibial tendinitis, right leg: Secondary | ICD-10-CM

## 2021-06-23 ENCOUNTER — Encounter: Payer: Self-pay | Admitting: Podiatry

## 2021-06-23 NOTE — Progress Notes (Signed)
Subjective:  Patient ID: Paul Cook, male    DOB: Jan 21, 1951,  MRN: [id number]  Chief Complaint  Patient presents with   Foot Pain    Right foot pain     70 y.o. male presents with the above complaint.  Patient presents with follow-up of generalized foot pain.  Patient states doing much better in cam boot immobilization.  He now has more localized to posterior tibial tendon.  He would like to discuss treatment options for it.  He denies any other acute complaints.   Review of Systems: Negative except as noted in the HPI. Denies N/V/F/Ch.  Past Medical History:  Diagnosis Date   Adenomatous colon polyp    tubular   Adenomatous rectal polyp    tubular   Asthma    Depression    Diverticulosis    GERD (gastroesophageal reflux disease)    History of chicken pox    Hyperlipidemia    Hypertension    Lung nodule    CT 10/2014   OSA (obstructive sleep apnea)    Sleep apnea    wears cpap   Vitamin D deficiency     Current Outpatient Medications:    amLODipine (NORVASC) 5 MG tablet, Take 1 tablet (5 mg total) by mouth daily., Disp: 90 tablet, Rfl: 3   aspirin 81 MG EC tablet, Take by mouth., Disp: , Rfl:    Cholecalciferol 50 MCG (2000 UT) CAPS, Take 2,000 Units by mouth., Disp: , Rfl:    diazepam (VALIUM) 5 MG tablet, Take 5 mg 15 minutes before MRI then another if needed before MRI sch 06/15/20, Disp: 2 tablet, Rfl: 0   diclofenac Sodium (VOLTAREN) 1 % GEL, Apply 2-4 g topically 4 (four) times daily., Disp: 150 g, Rfl: 11   dicyclomine (BENTYL) 10 MG capsule, Take 1 capsule (10 mg total) by mouth 2 (two) times daily., Disp: 180 capsule, Rfl: 3   ezetimibe (ZETIA) 10 MG tablet, Take 1 tablet (10 mg total) by mouth daily., Disp: 90 tablet, Rfl: 3   famotidine (PEPCID) 20 MG tablet, Take 1 tablet (20 mg total) by mouth daily., Disp: 90 tablet, Rfl: 3   gabapentin (NEURONTIN) 600 MG tablet, TAKE 1 TABLET(600 MG) BY MOUTH THREE TIMES DAILY, Disp: 270 tablet, Rfl: 0    lisinopril-hydrochlorothiazide (ZESTORETIC) 20-12.5 MG tablet, TAKE 2 TABLETS BY MOUTH DAILY, Disp: 180 tablet, Rfl: 3   lisinopril-hydrochlorothiazide (ZESTORETIC) 20-12.5 MG tablet, TAKE 2 TABLETS BY MOUTH DAILY, Disp: 180 tablet, Rfl: 3   LORazepam (ATIVAN) 0.5 MG tablet, Take 1-2 tablets (0.5-1 mg total) by mouth at bedtime as needed for anxiety., Disp: 60 tablet, Rfl: 2   metoprolol succinate (TOPROL-XL) 25 MG 24 hr tablet, TAKE 1 TABLET(25 MG) BY MOUTH DAILY, Disp: 90 tablet, Rfl: 3   metoprolol succinate (TOPROL-XL) 25 MG 24 hr tablet, TAKE 1 TABLET(25 MG) BY MOUTH DAILY, Disp: 90 tablet, Rfl: 3   nystatin cream (MYCOSTATIN), Apply 1 application topically 2 (two) times daily., Disp: 90 g, Rfl: 11   rOPINIRole (REQUIP) 2 MG tablet, TAKE 3 TABLETS(6 MG) BY MOUTH AT BEDTIME, Disp: 270 tablet, Rfl: 1   trazodone (DESYREL) 300 MG tablet, Take 1 tablet (300 mg total) by mouth at bedtime., Disp: 31 tablet, Rfl: 5  Social History   Tobacco Use  Smoking Status Never  Smokeless Tobacco Never    Allergies  Allergen Reactions   Nsaids Other (See Comments)    GI Issues   Objective:  There were no vitals filed for this visit.  There is no height or weight on file to calculate BMI. Constitutional Well developed. Well nourished.  Vascular Dorsalis pedis pulses palpable bilaterally. Posterior tibial pulses palpable bilaterally. Capillary refill normal to all digits.  No cyanosis or clubbing noted. Pedal hair growth normal.  Neurologic Normal speech. Oriented to person, place, and time. Epicritic sensation to light touch grossly present bilaterally.  Dermatologic Pain along the course of the posterior tibial tendon.  Pain with resisted plantarflexion inversion of the foot.  No pain with dorsiflexion eversion of the foot.  No pain at the Achilles tendon ATFL or peroneal tendon.  Gait examination shows pes planovalgus foot structure with calcaneovalgus to many toe signs partially but recruit the  arch with dorsiflexion of the hallux.  Unable to perform single and double heel raise.  Orthopedic: Normal joint ROM without pain or crepitus bilaterally. No visible deformities. No bony tenderness.   Radiographs: None Assessment:   1. Posterior tibial tendinitis, right   2. Pes planovalgus     Plan:  Patient was evaluated and treated and all questions answered.  Right posterior tibial tendinitis -External patient the etiology of tendinitis and various treatment options were discussed.  Given the amount of pain that is having I believe patient will benefit from steroid injection of decrease acute inflammatory component associated pain.  I discussed with him that since this is nearing tendon there is a risk of rupture associated with it.  Patient agrees with plan will elect to proceed with injection. -A steroid injection was performed at right medial foot at point of maximal tenderness using 1% plain Lidocaine and 10 mg of Kenalog. This was well tolerated.  Pes planovalgus -I explained to the patient the etiology of pes planovalgus and various treatment options were extensively discussed.  Given the amount of pain that he is having I believe benefit from custom-made orthotics to control after motion support the arch of the foot take the stress away from the posterior tibial tendon.  Patient agrees with the plan. -He was casted for orthotics   No follow-ups on file.

## 2021-06-28 ENCOUNTER — Other Ambulatory Visit: Payer: Self-pay

## 2021-06-28 DIAGNOSIS — I10 Essential (primary) hypertension: Secondary | ICD-10-CM

## 2021-06-28 MED ORDER — LISINOPRIL-HYDROCHLOROTHIAZIDE 20-12.5 MG PO TABS: 2.0000 | ORAL_TABLET | Freq: Every day | ORAL | 3 refills | Status: DC

## 2021-06-29 ENCOUNTER — Ambulatory Visit: Payer: PPO | Admitting: Family

## 2021-06-30 ENCOUNTER — Ambulatory Visit: Payer: PPO | Admitting: Family

## 2021-07-02 ENCOUNTER — Ambulatory Visit: Payer: PPO | Admitting: Family

## 2021-07-02 ENCOUNTER — Ambulatory Visit (INDEPENDENT_AMBULATORY_CARE_PROVIDER_SITE_OTHER): Payer: PPO | Admitting: Internal Medicine

## 2021-07-02 ENCOUNTER — Encounter: Payer: Self-pay | Admitting: Internal Medicine

## 2021-07-02 ENCOUNTER — Other Ambulatory Visit: Payer: Self-pay

## 2021-07-02 VITALS — BP 140/88 | HR 81 | Temp 95.5°F | Ht 66.0 in | Wt 325.6 lb

## 2021-07-02 DIAGNOSIS — R748 Abnormal levels of other serum enzymes: Secondary | ICD-10-CM

## 2021-07-02 DIAGNOSIS — R413 Other amnesia: Secondary | ICD-10-CM | POA: Diagnosis not present

## 2021-07-02 DIAGNOSIS — F1010 Alcohol abuse, uncomplicated: Secondary | ICD-10-CM | POA: Diagnosis not present

## 2021-07-02 DIAGNOSIS — F0150 Vascular dementia without behavioral disturbance: Secondary | ICD-10-CM

## 2021-07-02 DIAGNOSIS — F419 Anxiety disorder, unspecified: Secondary | ICD-10-CM

## 2021-07-02 DIAGNOSIS — E785 Hyperlipidemia, unspecified: Secondary | ICD-10-CM | POA: Diagnosis not present

## 2021-07-02 DIAGNOSIS — F422 Mixed obsessional thoughts and acts: Secondary | ICD-10-CM

## 2021-07-02 DIAGNOSIS — Z6841 Body Mass Index (BMI) 40.0 and over, adult: Secondary | ICD-10-CM

## 2021-07-02 DIAGNOSIS — I10 Essential (primary) hypertension: Secondary | ICD-10-CM | POA: Diagnosis not present

## 2021-07-02 DIAGNOSIS — R5383 Other fatigue: Secondary | ICD-10-CM

## 2021-07-02 DIAGNOSIS — Z23 Encounter for immunization: Secondary | ICD-10-CM

## 2021-07-02 DIAGNOSIS — G4700 Insomnia, unspecified: Secondary | ICD-10-CM

## 2021-07-02 DIAGNOSIS — F32A Depression, unspecified: Secondary | ICD-10-CM

## 2021-07-02 DIAGNOSIS — F0153 Vascular dementia, unspecified severity, with mood disturbance: Secondary | ICD-10-CM | POA: Insufficient documentation

## 2021-07-02 LAB — LIPID PANEL
Cholesterol: 149 mg/dL (ref 0–200)
HDL: 51 mg/dL (ref >39.00)
LDL Cholesterol: 82 mg/dL (ref 0–99)
NonHDL: 98.23
Total CHOL/HDL Ratio: 3
Triglycerides: 81 mg/dL (ref 0.0–149.0)
VLDL: 16.2 mg/dL (ref 0.0–40.0)

## 2021-07-02 LAB — CBC WITH DIFFERENTIAL/PLATELET
Basophils Absolute: 0 10*3/uL (ref 0.0–0.1)
Basophils Relative: 0.5 % (ref 0.0–3.0)
Eosinophils Absolute: 0.2 10*3/uL (ref 0.0–0.7)
Eosinophils Relative: 1.9 % (ref 0.0–5.0)
HCT: 44.4 % (ref 39.0–52.0)
Hemoglobin: 14.7 g/dL (ref 13.0–17.0)
Lymphocytes Relative: 21.7 % (ref 12.0–46.0)
Lymphs Abs: 1.8 10*3/uL (ref 0.7–4.0)
MCHC: 33.2 g/dL (ref 30.0–36.0)
MCV: 93.5 fl (ref 78.0–100.0)
Monocytes Absolute: 0.6 10*3/uL (ref 0.1–1.0)
Monocytes Relative: 7.1 % (ref 3.0–12.0)
Neutro Abs: 5.8 10*3/uL (ref 1.4–7.7)
Neutrophils Relative %: 68.8 % (ref 43.0–77.0)
Platelets: 166 10*3/uL (ref 150.0–400.0)
RBC: 4.74 Mil/uL (ref 4.22–5.81)
RDW: 13.9 % (ref 11.5–15.5)
WBC: 8.4 10*3/uL (ref 4.0–10.5)

## 2021-07-02 LAB — COMPREHENSIVE METABOLIC PANEL
ALT: 95 U/L — ABNORMAL HIGH (ref 0–53)
AST: 67 U/L — ABNORMAL HIGH (ref 0–37)
Albumin: 4 g/dL (ref 3.5–5.2)
Alkaline Phosphatase: 59 U/L (ref 39–117)
BUN: 15 mg/dL (ref 6–23)
CO2: 32 mEq/L (ref 19–32)
Calcium: 9.8 mg/dL (ref 8.4–10.5)
Chloride: 97 mEq/L (ref 96–112)
Creatinine, Ser: 0.91 mg/dL (ref 0.40–1.50)
GFR: 85.81 mL/min (ref >60.00)
Glucose, Bld: 87 mg/dL (ref 70–99)
Potassium: 3.9 mEq/L (ref 3.5–5.1)
Sodium: 137 mEq/L (ref 135–145)
Total Bilirubin: 0.7 mg/dL (ref 0.2–1.2)
Total Protein: 6.3 g/dL (ref 6.0–8.3)

## 2021-07-02 LAB — VITAMIN B12: Vitamin B-12: 728 pg/mL (ref 211–911)

## 2021-07-02 LAB — HEMOGLOBIN A1C: Hgb A1c MFr Bld: 6.1 % (ref 4.6–6.5)

## 2021-07-02 NOTE — Progress Notes (Signed)
Subjective:  Patient ID: Paul Cook, male    DOB: 22-Jun-1951  Age: 70 y.o. MRN: [id number]  CC: The primary encounter diagnosis was Essential hypertension. Diagnoses of Vascular dementia with depressed mood ([place]), Hyperlipidemia, unspecified hyperlipidemia type, Morbid obesity with BMI of 50.0-59.9, adult ([place]), Memory loss, Alcohol abuse, Fatigue, unspecified type, Anxiety and depression, Need for immunization against influenza, Insomnia, unspecified type, Mixed obsessional thoughts and acts, and Elevated liver enzymes were also pertinent to this visit.  HPI Paul Cook presents for evaulation of  ongoing memory loss   Chief Complaint  Patient presents with   Follow-up    Discuss memory issues   Paul Cook is 70 yr old college educated right handed male (immigrant from [place])  with history of alcohol abuse,  morbid obesity, OSA , CAD,  insomnia,  RLS   and GAD/depression  presents with cc memory loss. He is accompanied by wife who is voicing profound frustration and concern regarding the  lack of diagnoses regarding his  husband's progressive cognitive dysfunction despite being seen by a neurologist (referred to Dr Manuella Ghazi by his PCP Dr Aundra Dubin.  She notes that because he has no diagnosis, she is unable to received assistance at home , which she needs as she is recovering from a hip replacement.  She states that he requires assistance in remembering to take his medications, prepare his meals,  and keep his appointment.Marland Kitchen       His wife states that she was recognizing behavior change 8 yrs ago , which resulted in some terminations of employment .  The memory changes became apparent about 4 years ago, which strained the relationship.  His wife states that he stopped managing family finances 4 years ago , unbeknownst to her, which resulted in significant family financial stress.    His behavioral change was initially  addressed in Sept 2020 by his PCP in the setting of chronic alcohol abuse x 45  years and at that time he was still abusing alcohol,  drinking  4-12 beers daily and managing insomnia with [place]. Marland Kitchen  He was referred to psychiatry . He received the diagnosis of OCD   He was evaluated by Dr Manuella Ghazi in March 2021 for  memory loss . SLUMS score was 21/30.  Screening labs for metabolic and infectious causes of dementia were all negative.  A CT brain done I n 2021 noted atherosclerosis of major blood vessels at base of brain , small vessel changes in the white matter and age related volume changes.   He states that because of his previous acacemic succes she was even nominated for think tank many years ago . 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.  Cannot remember to take medications.  Unable to follow commands (eg purchase grocery items with a written list)..  confuses appointments.  Can't remember how to use the cellular  phone and how to retrieve phone  messages.  Not forgetting family members,  locations,     He had home health nursing for speech therapy  over a year ago , but feels the therapy was limited by inability to contact the neurologist Dr Manuella Ghazi , who the patient's wife has now been able to contact  OSA:  not currently using because machine has not used it in 2 years broken  down and can't get repaired.    Need  sleep study  for new machine.  Sleeps  on back   Obesity:  Not walking.  Due to chronic right knee and right ankle.    Discussed mounjaro   OCD;  per wife "severe addiction to comfort"  he cuts up his clothes because he feels they are too tight, costing her too much money replacing clothes . Does not see a psychiatrist anymore bc he was told he was "depressed" over a year ago.  Saw [facility]  for 3 months ,  wife did not go.  Counselling occurred 4 years ago   Alcohol abuse ;  has not drank heavily for 6 months.  Currently abstinent for the past 3 weeks from  alcohol.   Outpatient Medications Prior to Visit  Medication Sig Dispense Refill   amLODipine (NORVASC) 5 MG tablet Take 1 tablet (5 mg total) by mouth daily. 90 tablet 3   ezetimibe (ZETIA) 10 MG tablet Take 1 tablet (10 mg total) by mouth daily. 90 tablet 3   gabapentin (NEURONTIN) 600 MG tablet TAKE 1 TABLET(600 MG) BY MOUTH THREE TIMES DAILY 270 tablet 0   lisinopril-hydrochlorothiazide (ZESTORETIC) 20-12.5 MG tablet TAKE 2 TABLETS BY MOUTH DAILY 180 tablet 3   metoprolol succinate (TOPROL-XL) 25 MG 24 hr tablet TAKE 1 TABLET(25 MG) BY MOUTH DAILY 90 tablet 3   rOPINIRole (REQUIP) 2 MG tablet TAKE 3 TABLETS(6 MG) BY MOUTH AT BEDTIME 270 tablet 1   LORazepam (ATIVAN) 0.5 MG tablet Take 1-2 tablets (0.5-1 mg total) by mouth at bedtime as needed for anxiety. 60 tablet 2   aspirin 81 MG EC tablet Take by mouth. (Patient not taking: Reported on 07/02/2021)     Cholecalciferol 50 MCG (2000 UT) CAPS Take 2,000 Units by mouth. (Patient not taking: Reported on 07/02/2021)     diazepam (VALIUM) 5 MG tablet Take 5 mg 15 minutes before MRI then another if needed before MRI sch 06/15/20 (Patient not taking: Reported on 07/02/2021) 2 tablet 0   diclofenac Sodium (VOLTAREN) 1 % GEL Apply 2-4 g topically 4 (four) times daily. (Patient not taking: Reported on 07/02/2021) 150 g 11   dicyclomine (BENTYL) 10 MG capsule Take 1 capsule (10 mg total) by mouth 2 (two) times daily. (Patient not taking: Reported on 07/02/2021) 180 capsule 3   famotidine (PEPCID) 20 MG tablet Take 1 tablet (20 mg total) by mouth daily. (Patient not taking: Reported on 07/02/2021) 90 tablet 3   lisinopril-hydrochlorothiazide (ZESTORETIC) 20-12.5 MG tablet Take 2 tablets by mouth daily. (Patient not taking: Reported on 07/02/2021) 180 tablet 3   metoprolol succinate (TOPROL-XL) 25 MG 24 hr tablet TAKE 1 TABLET(25 MG) BY MOUTH DAILY (Patient not taking: Reported on 07/02/2021) 90 tablet 3   nystatin cream (MYCOSTATIN) Apply 1 application topically  2 (two) times daily. (Patient not taking: Reported on 07/02/2021) 90 g 11   trazodone (DESYREL) 300 MG tablet Take 1 tablet (300 mg total) by mouth at bedtime. (Patient not taking: Reported on 07/02/2021) 31 tablet 5   No facility-administered medications prior to visit.    Review of Systems;  Patient denies headache, fevers, malaise, unintentional weight loss, skin rash, eye pain, sinus congestion and sinus pain, sore throat, dysphagia,  hemoptysis , cough, dyspnea, wheezing, chest pain, palpitations, orthopnea, edema, abdominal pain, nausea, melena, diarrhea, constipation, flank pain, dysuria, hematuria, urinary  Frequency, nocturia, numbness, tingling, seizures,  Focal weakness, Loss of consciousness,  Tremor, insomnia, depression, anxiety, and suicidal ideation.      Objective:  BP 140/88 (BP Location: Left Arm, Patient Position: Sitting, Cuff  Size: Normal)   Pulse 81   Temp (!) 95.5 F (35.3 C) (Temporal)   Ht '5\' 6"'$  (1.676 m)   Wt (!) 325 lb 9.6 oz (147.7 kg)   SpO2 92%   BMI 52.55 kg/m   BP Readings from Last 3 Encounters:  07/02/21 140/88  10/30/20 134/78  08/05/20 138/70    Wt Readings from Last 3 Encounters:  07/02/21 (!) 325 lb 9.6 oz (147.7 kg)  10/30/20 (!) 314 lb 12.8 oz (142.8 kg)  08/05/20 (!) 311 lb (141.1 kg)    General appearance: alert, cooperative and appears stated age Ears: normal TM's and external ear canals both ears Throat: lips, mucosa, and tongue normal; teeth and gums normal Neck: no adenopathy, no carotid bruit, supple, symmetrical, trachea midline and thyroid not enlarged, symmetric, no tenderness/mass/nodules Back: symmetric, no curvature. ROM normal. No CVA tenderness. Lungs: clear to auscultation bilaterally Heart: regular rate and rhythm, S1, S2 normal, no murmur, click, rub or gallop Abdomen: soft, non-tender; bowel sounds normal; no masses,  no organomegaly Pulses: 2+ and symmetric Skin: Skin color, texture, turgor normal. No rashes or  lesions Lymph nodes: Cervical, supraclavicular, and axillary nodes normal.  Lab Results  Component Value Date   HGBA1C 6.1 07/02/2021   HGBA1C 5.7 01/20/2020   HGBA1C 5.8 07/09/2019    Lab Results  Component Value Date   CREATININE 0.91 07/02/2021   CREATININE 0.98 05/15/2020   CREATININE 0.92 01/23/2020    Lab Results  Component Value Date   WBC 8.4 07/02/2021   HGB 14.7 07/02/2021   HCT 44.4 07/02/2021   PLT 166.0 07/02/2021   GLUCOSE 87 07/02/2021   CHOL 149 07/02/2021   TRIG 81.0 07/02/2021   HDL 51.00 07/02/2021   LDLCALC 82 07/02/2021   ALT 95 (H) 07/02/2021   AST 67 (H) 07/02/2021   NA 137 07/02/2021   K 3.9 07/02/2021   CL 97 07/02/2021   CREATININE 0.91 07/02/2021   BUN 15 07/02/2021   CO2 32 07/02/2021   TSH 2.21 07/09/2019   PSA 1.04 07/09/2019   HGBA1C 6.1 07/02/2021   MICROALBUR 0.7 04/15/2014    CT Chest Wo Contrast  Result Date: 06/09/2020 CLINICAL DATA:  70 year old male with pulmonary nodule follow-up. EXAM: CT CHEST WITHOUT CONTRAST TECHNIQUE: Multidetector CT imaging of the chest was performed following the standard protocol without IV contrast. COMPARISON:  Chest radiograph dated 07/16/2016 and CT of the abdomen pelvis dated 06/25/2015. FINDINGS: Evaluation of this exam is limited in the absence of intravenous contrast. Cardiovascular: There is no cardiomegaly or pericardial effusion. There is coronary vascular calcification of the LAD. Mild atherosclerotic calcification of the thoracic aorta. No aneurysmal dilatation. The central pulmonary arteries are grossly unremarkable on this noncontrast CT. Mediastinum/Nodes: There is no hilar or mediastinal adenopathy. The esophagus and the thyroid gland are grossly unremarkable. No mediastinal fluid collection. Lungs/Pleura: Punctate right lung base subpleural nodule (105/3) similar to the study of 2016. No further follow-up recommended. Minimal bilateral lower lobe subpleural atelectasis/scarring. No focal  consolidation, pleural effusion, or pneumothorax. The central airways are patent. Upper Abdomen: Fatty liver.  Scattered colonic diverticula. Musculoskeletal: Degenerative changes of the spine. No acute osseous pathology. IMPRESSION: 1. No acute intrathoracic pathology. Stable punctate right lower lobe subpleural nodule. No further follow-up advised. 2. Coronary vascular calcification of the LAD. 3. Fatty liver. 4. Aortic Atherosclerosis (ICD10-I70.0). Electronically Signed   By: Anner Crete M.D.   On: 06/09/2020 00:18    Assessment & Plan:   Problem List Items  Addressed This Visit       Unprioritized   Insomnia    Historically treated with ambien, lorazepam and trazodone, but currently taking  None.  Would avoid all benzodiazepines given his history of alcohol abuse, current alcohol use,  And untreated OSA>       Morbid obesity with BMI of 50.0-59.9, adult ([place])    With OSA and hypertension.  No personal history of thyroid CA or pancreatitis; will return to discuss use of GLP agonist.       Relevant Orders   AMB Referral to [facility]   Hemoglobin A1c (Completed)   Comprehensive metabolic panel (Completed)   Anxiety and depression   Relevant Orders   Ambulatory referral to Psychiatry   Essential hypertension - Primary   HLD (hyperlipidemia)   Relevant Orders   Lipid panel (Completed)   Vascular dementia with depressed mood ([place])    Explained that his dementia was multifactorial , with vascular and alcohol abuse both contributing. Based on previous neurologic  exam and current functional status as reported by his wife [name],  He is not  Competent to manage financial or medical affairs and requires supervision to prevent injury from medications.        Relevant Orders   AMB Referral to Community Care Coordinaton   OCD (obsessive compulsive disorder)    His wife reports compulsivity and ritualistic behavior suggestive of OCD>  referring to psychiatry  For  management       Elevated liver enzymes    Transaminases have been elevated for over a year ; liver US done in 2021 suggested fatty liver and cysts;  MRI abdomen done at outside hospital confirmed diffuse hepatosteatosis.  Referral to GI advised for further evaluation       RESOLVED: Memory loss   Other Visit Diagnoses     Alcohol abuse       Relevant Orders   Vitamin B12 (Completed)   Folate RBC   Vitamin B1   Fatigue, unspecified type       Relevant Orders   CBC with Differential/Platelet (Completed)   Need for immunization against influenza       Relevant Orders   Flu Vaccine QUAD High Dose(Fluad) (Completed)       I provided  50 minutes of  face-to-face time during this encounter reviewing patient's current problems and past evaluations, labs and imaging studies, providing counseling on the above mentioned problems , and coordination  of care .    Medications Discontinued During This Encounter  Medication Reason   lisinopril-hydrochlorothiazide (ZESTORETIC) 123456 MG tablet Duplicate   metoprolol succinate (TOPROL-XL) 25 MG 24 hr tablet Duplicate   diazepam (VALIUM) 5 MG tablet    LORazepam (ATIVAN) 0.5 MG tablet    trazodone (DESYREL) 300 MG tablet    nystatin cream (MYCOSTATIN)    famotidine (PEPCID) 20 MG tablet    dicyclomine (BENTYL) 10 MG capsule    diclofenac Sodium (VOLTAREN) 1 % GEL    Cholecalciferol 50 MCG (2000 UT) CAPS    aspirin 81 MG EC tablet     Follow-up: No follow-ups on file.   Crecencio Mc, MD

## 2021-07-02 NOTE — Patient Instructions (Signed)
  I am letting you know that I am referring to our  Chronic Care management team  which includes a nurse case manager, clinical pharmacist ,  and social worker Catie [place].    I do not expect this referral to cost you anything out of pocket, but I do think  this team will be able to  assist you and managing your chronic issues.   I am making a referral for your anxiety disorder (OCD)   We will have you return in 2 weeks to discuss management of the obesity

## 2021-07-04 DIAGNOSIS — F429 Obsessive-compulsive disorder, unspecified: Secondary | ICD-10-CM | POA: Insufficient documentation

## 2021-07-04 DIAGNOSIS — R748 Abnormal levels of other serum enzymes: Secondary | ICD-10-CM | POA: Insufficient documentation

## 2021-07-04 NOTE — Assessment & Plan Note (Addendum)
Historically treated with ambien, lorazepam and trazodone, but currently taking  None.  Would avoid all benzodiazepines given his history of alcohol abuse, current alcohol use,  And untreated OSA>

## 2021-07-04 NOTE — Assessment & Plan Note (Addendum)
Transaminases have been elevated for over a year ; liver US done in 2021 suggested fatty liver and cysts;  MRI abdomen done at outside hospital confirmed diffuse hepatosteatosis.  Referral to GI advised for further evaluation

## 2021-07-04 NOTE — Assessment & Plan Note (Signed)
With OSA and hypertension.  No personal history of thyroid CA or pancreatitis; will return to discuss use of GLP agonist.

## 2021-07-04 NOTE — Assessment & Plan Note (Signed)
Explained that his dementia was multifactorial , with vascular and alcohol abuse both contributing. Based on previous neurologic  exam and current functional status as reported by his wife [name],  He is not  Competent to manage financial or medical affairs and requires supervision to prevent injury from medications.

## 2021-07-04 NOTE — Assessment & Plan Note (Signed)
His wife reports compulsivity and ritualistic behavior suggestive of OCD>  referring to psychiatry  For management

## 2021-07-05 ENCOUNTER — Telehealth: Payer: Self-pay

## 2021-07-05 NOTE — Chronic Care Management (AMB) (Signed)
  Chronic Care Management   Note  07/05/2021 Name: Rayshawn Maney MRN: [id number] DOB: [birth year]  Camrin Gearheart is a 69 y.o. year old male who is a primary care patient of Mc[facility]-Scocuzza, [name], MD. I reached out to [name] by phone today in response to a referral sent by Mr. Deaglan Rhude's PCP, [facility]-Scocuzza, [name], MD      Mr. Costilla was given information about Chronic Care Management services today including:  CCM service includes personalized support from designated clinical staff supervised by his physician, including individualized plan of care and coordination with other care providers 24/7 contact phone numbers for assistance for urgent and routine care needs. Service will only be billed when office clinical staff spend 20 minutes or more in a month to coordinate care. Only one practitioner may furnish and bill the service in a calendar month. The patient may stop CCM services at any time (effective at the end of the month) by phone call to the office staff. The patient will be responsible for cost sharing (co-pay) of up to 20% of the service fee (after annual deductible is met).  Patient agreed to services and verbal consent obtained.   Follow up plan: Telephone appointment with care management team member scheduled for: LCSW 07/07/2021 RN CM 07/09/2021 Pharm D 07/20/2021  Noreene Larsson, [place], [place], [place] [address] Direct Dial: [phone] [email or web address] Website: [email or web address]

## 2021-07-05 NOTE — Addendum Note (Signed)
Addended by: Crecencio Mc on: 07/05/2021 08:42 PM   Modules accepted: Orders

## 2021-07-06 ENCOUNTER — Encounter: Payer: Self-pay | Admitting: *Deleted

## 2021-07-06 LAB — EXTRA SPECIMEN

## 2021-07-06 LAB — FOLATE RBC: RBC Folate: 441 ng/mL RBC (ref >280)

## 2021-07-07 ENCOUNTER — Telehealth: Payer: Self-pay

## 2021-07-07 ENCOUNTER — Ambulatory Visit (INDEPENDENT_AMBULATORY_CARE_PROVIDER_SITE_OTHER): Payer: PPO | Admitting: *Deleted

## 2021-07-07 DIAGNOSIS — F0153 Vascular dementia, unspecified severity, with mood disturbance: Secondary | ICD-10-CM

## 2021-07-07 DIAGNOSIS — G8929 Other chronic pain: Secondary | ICD-10-CM

## 2021-07-07 DIAGNOSIS — M5010 Cervical disc disorder with radiculopathy, unspecified cervical region: Secondary | ICD-10-CM

## 2021-07-07 DIAGNOSIS — F0150 Vascular dementia without behavioral disturbance: Secondary | ICD-10-CM

## 2021-07-07 DIAGNOSIS — I10 Essential (primary) hypertension: Secondary | ICD-10-CM

## 2021-07-07 DIAGNOSIS — R5383 Other fatigue: Secondary | ICD-10-CM

## 2021-07-07 DIAGNOSIS — R413 Other amnesia: Secondary | ICD-10-CM

## 2021-07-07 NOTE — Telephone Encounter (Signed)
Pt. Ready to schedule colonoscopy 

## 2021-07-07 NOTE — Telephone Encounter (Signed)
Called patient no answer.

## 2021-07-07 NOTE — Addendum Note (Signed)
Addended by: Leeanne Rio on: 07/07/2021 04:16 PM   Modules accepted: Orders

## 2021-07-08 ENCOUNTER — Other Ambulatory Visit: Payer: PPO

## 2021-07-08 NOTE — Patient Instructions (Signed)
Visit Information   PATIENT GOALS:   Goals Addressed             This Visit's Progress    Find Help in My Community.   On track    Timeframe:  Short-Term Goal Priority:  High Start Date:   07/07/2021                       Expected End Date:   09/06/2021                Follow-Up Date:  07/14/2021 at 2:00pm  Patient Goals/Self-Care Activities:  Wife will follow-up with Paul Cook, Social Worker II with the [facility] [phone] or # [phone]), to check the status of your application process. Wife will work with LCSW on a weekly/bi-weekly basis, until approved for [employer], or until in-home care services are in place through a private agency of choice. Wife will consider self-enrolling in a Caregiver Support Group, from the list provided. Wife will contact LCSW directly (# [phone]), if she has questions, needs assistance, or if additional social work needs are identified between now and our next scheduled telephone outreach call.        Consent to CCM Services: Paul Cook was given information about Chronic Care Management services including:  CCM service includes personalized support from designated clinical staff supervised by his physician, including individualized plan of care and coordination with other care providers 24/7 contact phone numbers for assistance for urgent and routine care needs. Service will only be billed when office clinical staff spend 20 minutes or more in a month to coordinate care. Only one practitioner may furnish and bill the service in a calendar month. The patient may stop CCM services at any time (effective at the end of the month) by phone call to the office staff. The patient will be responsible for cost sharing (co-pay) of up to 20% of the service fee (after annual deductible is met).  Patient agreed to services and verbal consent obtained.   Patient  verbalizes understanding of instructions provided today and agrees to view in [place].   Telephone follow up appointment with care management team member scheduled for:  07/14/2021 at 2:00pm  [place] Licensed Clinical Social Worker [place]  [phone]   CLINICAL CARE PLAN: Patient Care Plan: LCSW Plan of Care     Problem Identified: Find Help in My Community.   Priority: High     Goal: Find Help in My Community.   Start Date: 07/07/2021  Expected End Date: 09/06/2021  This Visit's Progress: On track  Priority: High  Note:   Current Barriers:   Patient with Vascular Dementia with Depressed Mood, Cervical Disc Disorder with Radiculopathy of Cervical Region, Morbid Obesity, Chronic Pain of Right Ankle and Bilateral Leg Edema needs Support, Education, Resources, Referrals and Care Coordination to resolve unmet personal care needs in the home. Clinical Goals:  Patient will have in-home care services in place, either through Paul Cook, Education officer, museum II with the [facility], or through a private agency of choice. Patient's wife will work with LCSW and Paul Cook, Social Worker II with the [facility], to coordinate care for in-home aide services. Clinical Interventions: Patient's wife interviewed and appropriate assessments performed. Inter-disciplinary care team collaboration (see longitudinal plan of care).  Interventions performed:  Problem Solving/Task Centered, Quality of Sleep Assessed and Sleep Hygiene Techniques Promoted, Caregiver Stress Acknowledged and Consideration of In-Home Care Services Encouraged. Referral placed to Paul Cook, Social Worker II with the [facility]. Patient's wife will receive, review and consider arranging in-home care services  through a private agency of choice, from the list provided by LCSW.   Discussed plans with patient's wife for ongoing care management follow-up and provided direct contact information for care management team. Assisted patient's wife with obtaining information about health plan benefits through [employer]. Provided education to patient's wife regarding level of care options. Assessed needs, level of care concerns, basic eligibility and provided education on [employer] process. LCSW collaboration with Paul Cook, Social Worker II with the [facility], to verify application is received and processed. Identified resources and durable medical equipment needed in the home to improve safety and promote independence. Patient Goals/Self-Care Activities:  Wife will follow-up with Paul Cook, Social Worker II with the [facility] [phone] or # [phone]), to check the status of your application process. Wife will work with LCSW on a weekly/bi-weekly basis, until approved for [employer], or until in-home care services are in place through a private agency of choice. Wife will consider self-enrolling in a Caregiver Support Group, from the list provided. Wife will contact LCSW directly (# [phone]), if she has questions, needs assistance, or if additional social work needs are identified between now and our next scheduled telephone outreach call. Follow Up Plan: LCSW will follow-up with patient's wife on 07/14/2021 at 2:00pm

## 2021-07-08 NOTE — Chronic Care Management (AMB) (Signed)
Chronic Care Management    Clinical Social Work Note  07/08/2021 Name: Paul Cook MRN: [id number] DOB: 03/04/1951  Paul Cook is a 70 y.o. year old male who is a primary care patient of McLean-Scocuzza, [name], MD. The CCM team was consulted to assist the patient with chronic disease management and/or care coordination needs related to: Level of Care Concerns and Caregiver Stress.   Engaged with patient's wife by telephone for initial visit in response to provider referral for social work chronic care management and care coordination services.   Consent to Services:  The patient was given information about Chronic Care Management services, agreed to services, and gave verbal consent prior to initiation of services.  Please see initial visit note for detailed documentation.   Patient agreed to services and consent obtained.   Assessment: Review of patient past medical history, allergies, medications, and health status, including review of relevant consultants reports was performed today as part of a comprehensive evaluation and provision of chronic care management and care coordination services.     SDOH (Social Determinants of Health) assessments and interventions performed:  SDOH Interventions    Flowsheet Row Most Recent Value  SDOH Interventions   Food Insecurity Interventions Intervention Not Indicated, Other (Comment)  [Verified by Wife - Dapne Fialkowski]  Financial Strain Interventions Intervention Not Indicated, Other (Comment)  [Verified by Wife - [place] Interventions Intervention Not Indicated, Other (Comment)  [Verified by Wife - Dapne Dimare]  Intimate Partner Violence Interventions Intervention Not Indicated, Other (Comment)  [Verified by Wife - Dapne Brunton]  Physical Activity Interventions Intervention Not Indicated, Other (Comments)  [Verified by Wife - Dapne Scarberry]  Stress Interventions Intervention Not Indicated, Other (Comment)  [Verified by Wife - Dapne  Volkov]  Social Connections Interventions Intervention Not Indicated, Other (Comment)  [Verified by Wife - Dapne Popper]  Transportation Interventions Intervention Not Indicated, Other (Comment)  [Verified by Wife - Dapne Maret]        Advanced Directives Status: See Care Plan for related entries.  CCM Care Plan  Allergies  Allergen Reactions   Nsaids Other (See Comments)    GI Issues    Outpatient Encounter Medications as of 07/07/2021  Medication Sig Note   amLODipine (NORVASC) 5 MG tablet Take 1 tablet (5 mg total) by mouth daily.    ezetimibe (ZETIA) 10 MG tablet Take 1 tablet (10 mg total) by mouth daily.    gabapentin (NEURONTIN) 600 MG tablet TAKE 1 TABLET(600 MG) BY MOUTH THREE TIMES DAILY 07/02/2021: Taking 1/2 tablet once daily   lisinopril-hydrochlorothiazide (ZESTORETIC) 20-12.5 MG tablet TAKE 2 TABLETS BY MOUTH DAILY    metoprolol succinate (TOPROL-XL) 25 MG 24 hr tablet TAKE 1 TABLET(25 MG) BY MOUTH DAILY    rOPINIRole (REQUIP) 2 MG tablet TAKE 3 TABLETS(6 MG) BY MOUTH AT BEDTIME    No facility-administered encounter medications on file as of 07/07/2021.    Patient Active Problem List   Diagnosis Date Noted   OCD (obsessive compulsive disorder) 07/04/2021   Elevated liver enzymes 07/04/2021   Vascular dementia with depressed mood ([place]) 07/02/2021   DOE (dyspnea on exertion) 08/06/2020   Abnormal EKG 08/06/2020   Coronary artery calcification seen on CT scan 07/13/2020   Coronary atherosclerosis due to calcified coronary lesion 06/10/2020   Aortic atherosclerosis ([place]) 06/10/2020   Lung nodule seen on imaging study 06/10/2020   Cyst of left kidney 05/29/2020   Gallstone 05/29/2020   Fatty liver 05/29/2020   Bilateral leg edema 05/15/2020  Venous stasis dermatitis of right lower extremity 05/15/2020   Right ankle swelling 12/17/2019   HLD (hyperlipidemia) 11/09/2018   History of alcohol abuse 09/06/2018   Chronic pain of right ankle 09/06/2018   Vitamin D  deficiency 06/29/2018   Prediabetes 06/28/2018   Plantar fasciitis 08/29/2016   GERD (gastroesophageal reflux disease) 12/18/2015   Restless legs 03/19/2015   Essential hypertension 01/14/2014   Obstructive sleep apnea 12/17/2013   IBS (irritable bowel syndrome) 07/30/2013   Insomnia 06/12/2013   Cervical disc disorder with radiculopathy of cervical region 06/12/2013   Morbid obesity with BMI of 50.0-59.9, adult ([place]) 06/12/2013   Seasonal allergies 06/12/2013   Anxiety and depression 06/12/2013    Conditions to be addressed/monitored:  Vascular Dementia.  Limited Social Support, Level of Care Concerns, ADL/IADL Limitations, Limited Access to Caregiver, Cognitive Deficits, Memory Deficits, and Lacks Knowledge of [employer].  Care Plan : LCSW Plan of Care  Updates made by Francis Gaines, LCSW since 07/08/2021 12:00 AM     Problem: Find Help in My Community.   Priority: High     Goal: Find Help in My Community.   Start Date: 07/07/2021  Expected End Date: 09/06/2021  This Visit's Progress: On track  Priority: High  Note:   Current Barriers:   Patient with Vascular Dementia with Depressed Mood, Cervical Disc Disorder with Radiculopathy of Cervical Region, Morbid Obesity, Chronic Pain of Right Ankle and Bilateral Leg Edema needs Support, Education, Resources, Referrals and Care Coordination to resolve unmet personal care needs in the home. Clinical Goals:  Patient will have in-home care services in place, either through Rosealee Albee, Education officer, museum II with the [facility] [facility], or through a private agency of choice. Patient's wife will work with LCSW and Rosealee Albee, Social Worker II with the [facility], to coordinate care for in-home aide services. Clinical Interventions: Patient's wife interviewed and appropriate assessments  performed. Inter-disciplinary care team collaboration (see longitudinal plan of care). Interventions performed:  Problem Solving/Task Centered, Quality of Sleep Assessed and Sleep Hygiene Techniques Promoted, Caregiver Stress Acknowledged and Consideration of In-Home Care Services Encouraged. Referral placed to Rosealee Albee, Social Worker II with the [facility]. Patient's wife will receive, review and consider arranging in-home care services through a private agency of choice, from the list provided by LCSW.   Discussed plans with patient's wife for ongoing care management follow-up and provided direct contact information for care management team. Assisted patient's wife with obtaining information about health plan benefits through [employer]. Provided education to patient's wife regarding level of care options. Assessed needs, level of care concerns, basic eligibility and provided education on [employer] process. LCSW collaboration with Rosealee Albee, Social Worker II with the [place] [facility], to verify application is received and processed. Identified resources and durable medical equipment needed in the home to improve safety and promote independence. Patient Goals/Self-Care Activities:  Wife will follow-up with Rosealee Albee, Social Worker II with the [facility] [phone] or # [phone]), to check the status of your application process. Wife will work with LCSW on a weekly/bi-weekly basis, until approved for [employer], or until in-home care services are in place through a private agency of choice. Wife will consider self-enrolling  in a Caregiver Support Group, from the list provided. Wife will contact LCSW directly (# [phone]), if she has questions,  needs assistance, or if additional social work needs are identified between now and our next scheduled telephone outreach call. Follow Up Plan: LCSW will follow-up with patient's wife on 07/14/2021 at 2:00pm           [place] Licensed Clinical Social Worker [place]  [phone]

## 2021-07-09 ENCOUNTER — Ambulatory Visit: Payer: PPO | Admitting: *Deleted

## 2021-07-09 DIAGNOSIS — I10 Essential (primary) hypertension: Secondary | ICD-10-CM

## 2021-07-09 DIAGNOSIS — F32A Depression, unspecified: Secondary | ICD-10-CM

## 2021-07-09 DIAGNOSIS — F0153 Vascular dementia, unspecified severity, with mood disturbance: Secondary | ICD-10-CM

## 2021-07-09 NOTE — Patient Instructions (Signed)
Visit Information   PATIENT GOALS:   Goals Addressed             This Visit's Progress    (RNCM)  Keeping Myself/Loved One Safe-Dementia       Timeframe:  Long-Range Goal Priority:  Medium Start Date:  07/09/21                           Expected End Date:  02/13/22                    Barriers: Knowledge Psychological Impairment Psychosocial  Follow Up Date 07/14/2021    Keep medicine where it is safe or locked Use a GPS device in the car Use appliances that have an auto shut-off feature  Keep patient active during the day; do more crossword puzzle Limit naps throughout the day Do one enjoyable thing every day; laugh; watch a funny movie or comedian Eat healthy; do not eat or exercise right before bedtime Get outdoors every day (weather permitting) Practice relaxation or meditation daily Spend time or talk with others at least 2 to 3 times per week   Why is this important?   Safety is important when you or your loved one has dementia.  Eyesight, hearing and changes in feelings of hot and cold or depth-perception may occur.  Wandering or getting lost may happen.  You/your loved one may have to stop driving.  Taking steps to improve safety can prevent injuries.  It will also help you/your loved one feel more relaxed.  It will help you/your loved one be independent for a longer time.     Notes:      (RNCM)  Track and Manage My Blood Pressure-Hypertension       Timeframe:  Long-Range Goal Priority:  Medium Start Date:    07/09/21                         Expected End Date: 02/13/22                     Barriers: Knowledge Psychological Impairment Psychosocial  Follow Up Date 07/14/21    Check blood pressure weekly Write blood pressure results in a log for provider review   Why is this important?   You won't feel high blood pressure, but it can still hurt your blood vessels.  High blood pressure can cause heart or kidney problems. It can also cause a stroke.  Making  lifestyle changes like losing a little weight or eating less salt will help.  Checking your blood pressure at home and at different times of the day can help to control blood pressure.  If the doctor prescribes medicine remember to take it the way the doctor ordered.  Call the office if you cannot afford the medicine or if there are questions about it.     Notes:         Consent to CCM Services: Paul Cook was given information about Chronic Care Management services including:  CCM service includes personalized support from designated clinical staff supervised by his physician, including individualized plan of care and coordination with other care providers 24/7 contact phone numbers for assistance for urgent and routine care needs. Service will only be billed when office clinical staff spend 20 minutes or more in a month to coordinate care. Only one practitioner may furnish and bill the service in a calendar month.  The patient may stop CCM services at any time (effective at the end of the month) by phone call to the office staff. The patient will be responsible for cost sharing (co-pay) of up to 20% of the service fee (after annual deductible is met).  Patient agreed to services and verbal consent obtained.   Print copy of patient instructions, educational materials, and care plan provided in person.  The care management team will reach out to the patient again over the next 10 business days.   Paul Azure RN, MSN RN Care Management Coordinator [place] [phone] [email or web address]   CLINICAL CARE PLAN: Patient Care Plan: LCSW Plan of Care     Problem Identified: Find Help in My Community.   Priority: High     Goal: Find Help in My Community.   Start Date: 07/07/2021  Expected End Date: 09/06/2021  This Visit's Progress: On track  Priority: High  Note:   Current Barriers:   Patient with Vascular Dementia with Depressed Mood,  Cervical Disc Disorder with Radiculopathy of Cervical Region, Morbid Obesity, Chronic Pain of Right Ankle and Bilateral Leg Edema needs Support, Education, Resources, Referrals and Care Coordination to resolve unmet personal care needs in the home. Clinical Goals:  Patient will have in-home care services in place, either through Rosealee Albee, Education officer, museum II with the [place], or through a private agency of choice. Patient's wife will work with LCSW and Rosealee Albee, Social Worker II with the [place] [facility], to coordinate care for in-home aide services. Clinical Interventions: Patient's wife interviewed and appropriate assessments performed. Inter-disciplinary care team collaboration (see longitudinal plan of care). Interventions performed:  Problem Solving/Task Centered, Quality of Sleep Assessed and Sleep Hygiene Techniques Promoted, Caregiver Stress Acknowledged and Consideration of In-Home Care Services Encouraged. Referral placed to Rosealee Albee, Social Worker II with the [facility]. Patient's wife will receive, review and consider arranging in-home care services through a private agency of choice, from the list provided by LCSW.   Discussed plans with patient's wife for ongoing care management follow-up and provided direct contact information for care management team. Assisted patient's wife with obtaining information about health plan benefits through [employer]. Provided education to patient's wife regarding level of care options. Assessed needs, level of care concerns, basic eligibility and provided education on [employer] process. LCSW collaboration with Rosealee Albee, Social Worker II with the [place]  Program, to verify application is received and processed. Identified resources and durable medical equipment needed in the home to improve safety and promote independence. Patient Goals/Self-Care Activities:  Wife will follow-up with Rosealee Albee, Social Worker II with the [facility] [phone] or # [phone]), to check the status of your application process. Wife will work with LCSW on a weekly/bi-weekly basis, until approved for [employer], or until in-home care services are in place through a private agency of choice. Wife will consider self-enrolling in a Caregiver Support Group, from the list provided. Wife will contact LCSW directly (# [phone]), if she has questions, needs assistance, or if additional social work needs are identified between now and our next scheduled telephone outreach call. Follow Up Plan: LCSW will follow-up with patient's wife on 07/14/2021 at 2:00pm  Patient Care Plan: CCM RNCM Care Plan     Problem Identified: Increasing forgetfulness related to dementia causing dificulties with self cae management of hypertension   Priority: Medium     Long-Range Goal: Patient and wife will work with CCM team to assist in learning more about dementia to help self care management of cormobidities   Start Date: 07/09/2021  Expected End Date: 02/13/2022  Priority: Medium  Note:   Current Barriers:  Knowledge Deficits related to plan of care for management of HTN and Dementia  Care Coordination needs related to Cognitive Deficits, Memory Deficits, and assistance arranging GI appointment  Cognitive Deficits;   History of hypertension and not monitoring blood pressures at home.  Wife has noticed increase in confusion for patient.  Lives at home with wife, currently at home alone, whole wife recovers from hip surgery at daughters house.  Per wife, patient continues to drive; does not feel well  today, has cold and bad cough.  Wife continues to manage meds through pill box, although he missed multiple days a few weeks ago while she was not home, nut states it has gotten better.  Has GI consultation appointment she is having trouble arranging.  RNCM Clinical Goal(s):  Patient will verbalize understanding of plan for management of HTN and Dementia  take all medications exactly as prescribed and will call provider for medication related questions continue to work with RN Care Manager to address care management and care coordination needs related to HTN and Dementia  work with Education officer, museum to address [place] Concerns , Cognitive Deficits, and Memory Deficits related to the management of Dementia through collaboration with RN Care manager, provider, and care team.  Wife will report monitoring blood pressure at home weekly within the next 45 days.  Interventions: 1:1 collaboration with primary care provider regarding development and update of comprehensive plan of care as evidenced by provider attestation and co-signature Inter-disciplinary care team collaboration (see longitudinal plan of care) Evaluation of current treatment plan related to  self management and patient's adherence to plan as established by provider  Dementia  (Status: New goal.) Evaluation of current treatment plan related to Dementia, Limited education about Dementia*, Cognitive Deficits, and Memory Deficits self-management and patient's adherence to plan as established by provider. Discussed plans with patient for ongoing care management follow up and provided patient with direct contact information for care management team Provided education to patient re: Dementia; Discussed plans with patient for ongoing care management follow up and provided patient with direct contact information for care management team; Advised patient to discuss diagnosis and medications with provider; Assessed social determinant of health  barriers;   Hypertension: (Status: New goal.) Last practice recorded BP readings:  BP Readings from Last 3 Encounters:  07/02/21 140/88  10/30/20 134/78  08/05/20 138/70  Most recent eGFR/CrCl: No results found for: EGFR  No components found for: CRCL  Evaluation of current treatment plan related to hypertension self management and patient's adherence to plan as established by provider;   Reviewed medications with patient and discussed importance of compliance;  Advised patient, providing education and rationale, to monitor blood pressure daily and record, calling PCP for findings outside established parameters;  Discussed complications of poorly controlled blood pressure such as heart disease, stroke, circulatory complications, vision complications, kidney impairment, sexual dysfunction;   Patient Goals/Self-Care Activities: Patient will attend all scheduled provider appointments as evidenced by clinician review of documented attendance to scheduled appointments and patient/caregiver report Patient will continue to perform  ADL's independently as evidenced by patient/caregiver report Patient will call provider office for new concerns or questions as evidenced by review of documented incoming telephone call notes and patient report Check blood pressure weekly Write blood pressure results in a log for provider review Keep medicine where it is safe or locked Use a GPS device in the car Use appliances that have an auto shut-off feature  Keep patient active during the day; do more crossword puzzle Limit naps throughout the day Do one enjoyable thing every day; laugh; watch a funny movie or comedian Eat healthy; do not eat or exercise right before bedtime Get outdoors every day (weather permitting) Practice relaxation or meditation daily Spend time or talk with others at least 2 to 3 times per week

## 2021-07-09 NOTE — Chronic Care Management (AMB) (Signed)
Chronic Care Management   CCM RN Visit Note  07/09/2021 Name: Paul Cook MRN: [id number] DOB: November 23, 1950  Subjective: Paul Cook is a 70 y.o. year old male who is a primary care patient of McLean-Scocuzza, [name], MD. The care management team was consulted for assistance with disease management and care coordination needs.    Engaged with patient by telephone for initial visit in response to provider referral for case management and/or care coordination services.   Consent to Services:  The patient was given the following information about Chronic Care Management services today, agreed to services, and gave verbal consent: 1. CCM service includes personalized support from designated clinical staff supervised by the primary care provider, including individualized plan of care and coordination with other care providers 2. 24/7 contact phone numbers for assistance for urgent and routine care needs. 3. Service will only be billed when office clinical staff spend 20 minutes or more in a month to coordinate care. 4. Only one practitioner may furnish and bill the service in a calendar month. 5.The patient may stop CCM services at any time (effective at the end of the month) by phone call to the office staff. 6. The patient will be responsible for cost sharing (co-pay) of up to 20% of the service fee (after annual deductible is met). Patient agreed to services and consent obtained.  Patient agreed to services and verbal consent obtained.   Assessment: Review of patient past medical history, allergies, medications, health status, including review of consultants reports, laboratory and other test data, was performed as part of comprehensive evaluation and provision of chronic care management services.   SDOH (Social Determinants of Health) assessments and interventions performed:  SDOH Interventions    Flowsheet Row Most Recent Value  SDOH Interventions   Food Insecurity Interventions Intervention  Not Indicated  Financial Strain Interventions Intervention Not Indicated  Housing Interventions Intervention Not Indicated  Transportation Interventions Intervention Not Indicated        CCM Care Plan  Allergies  Allergen Reactions   Nsaids Other (See Comments)    GI Issues    Outpatient Encounter Medications as of 07/09/2021  Medication Sig Note   amLODipine (NORVASC) 5 MG tablet Take 1 tablet (5 mg total) by mouth daily.    ezetimibe (ZETIA) 10 MG tablet Take 1 tablet (10 mg total) by mouth daily.    gabapentin (NEURONTIN) 600 MG tablet TAKE 1 TABLET(600 MG) BY MOUTH THREE TIMES DAILY 07/02/2021: Taking 1/2 tablet once daily   lisinopril-hydrochlorothiazide (ZESTORETIC) 20-12.5 MG tablet TAKE 2 TABLETS BY MOUTH DAILY    metoprolol succinate (TOPROL-XL) 25 MG 24 hr tablet TAKE 1 TABLET(25 MG) BY MOUTH DAILY    rOPINIRole (REQUIP) 2 MG tablet TAKE 3 TABLETS(6 MG) BY MOUTH AT BEDTIME    No facility-administered encounter medications on file as of 07/09/2021.    Patient Active Problem List   Diagnosis Date Noted   OCD (obsessive compulsive disorder) 07/04/2021   Elevated liver enzymes 07/04/2021   Vascular dementia with depressed mood ([name]) 07/02/2021   DOE (dyspnea on exertion) 08/06/2020   Abnormal EKG 08/06/2020   Coronary artery calcification seen on CT scan 07/13/2020   Coronary atherosclerosis due to calcified coronary lesion 06/10/2020   Aortic atherosclerosis ([place]) 06/10/2020   Lung nodule seen on imaging study 06/10/2020   Cyst of left kidney 05/29/2020   Gallstone 05/29/2020   Fatty liver 05/29/2020   Bilateral leg edema 05/15/2020   Venous stasis dermatitis of right lower extremity 05/15/2020  Right ankle swelling 12/17/2019   HLD (hyperlipidemia) 11/09/2018   History of alcohol abuse 09/06/2018   Chronic pain of right ankle 09/06/2018   Vitamin D deficiency 06/29/2018   Prediabetes 06/28/2018   Plantar fasciitis 08/29/2016   GERD (gastroesophageal reflux  disease) 12/18/2015   Restless legs 03/19/2015   Essential hypertension 01/14/2014   Obstructive sleep apnea 12/17/2013   IBS (irritable bowel syndrome) 07/30/2013   Insomnia 06/12/2013   Cervical disc disorder with radiculopathy of cervical region 06/12/2013   Morbid obesity with BMI of 50.0-59.9, adult ([place]) 06/12/2013   Seasonal allergies 06/12/2013   Anxiety and depression 06/12/2013    Conditions to be addressed/monitored:HTN and Dementia  Care Plan : CCM RNCM Care Plan  Updates made by Leona Singleton, RN since 07/09/2021 12:00 AM     Problem: Increasing forgetfulness related to dementia causing dificulties with self cae management of hypertension   Priority: Medium     Long-Range Goal: Patient and wife will work with CCM team to assist in learning more about dementia to help self care management of cormobidities   Start Date: 07/09/2021  Expected End Date: 02/13/2022  Priority: Medium  Note:   Current Barriers:  Knowledge Deficits related to plan of care for management of HTN and Dementia  Care Coordination needs related to Cognitive Deficits, Memory Deficits, and assistance arranging GI appointment  Cognitive Deficits;   History of hypertension and not monitoring blood pressures at home.  Wife has noticed increase in confusion for patient.  Lives at home with wife, currently at home alone, whole wife recovers from hip surgery at daughters house.  Per wife, patient continues to drive; does not feel well today, has cold and bad cough.  Wife continues to manage meds through pill box, although he missed multiple days a few weeks ago while she was not home, nut states it has gotten better.  Has GI consultation appointment she is having trouble arranging.  RNCM Clinical Goal(s):  Patient will verbalize understanding of plan for management of HTN and Dementia  take all medications exactly as prescribed and will call provider for medication related questions continue to work with RN  Care Manager to address care management and care coordination needs related to HTN and Dementia  work with Education officer, museum to address [place] Concerns , Cognitive Deficits, and Memory Deficits related to the management of Dementia through collaboration with RN Care manager, provider, and care team.  Wife will report monitoring blood pressure at home weekly within the next 45 days.  Interventions: 1:1 collaboration with primary care provider regarding development and update of comprehensive plan of care as evidenced by provider attestation and co-signature Inter-disciplinary care team collaboration (see longitudinal plan of care) Evaluation of current treatment plan related to  self management and patient's adherence to plan as established by provider  Dementia  (Status: New goal.) Evaluation of current treatment plan related to Dementia, Limited education about Dementia*, Cognitive Deficits, and Memory Deficits self-management and patient's adherence to plan as established by provider. Discussed plans with patient for ongoing care management follow up and provided patient with direct contact information for care management team Provided education to patient re: Dementia; Discussed plans with patient for ongoing care management follow up and provided patient with direct contact information for care management team; Advised patient to discuss diagnosis and medications with provider; Assessed social determinant of health barriers;   Hypertension: (Status: New goal.) Last practice recorded BP readings:  BP Readings from Last 3 Encounters:  07/02/21 140/88  10/30/20 134/78  08/05/20 138/70  Most recent eGFR/CrCl: No results found for: EGFR  No components found for: CRCL  Evaluation of current treatment plan related to hypertension self management and patient's adherence to plan as established by provider;   Reviewed medications with patient and discussed importance of compliance;  Advised  patient, providing education and rationale, to monitor blood pressure daily and record, calling PCP for findings outside established parameters;  Discussed complications of poorly controlled blood pressure such as heart disease, stroke, circulatory complications, vision complications, kidney impairment, sexual dysfunction;   Patient Goals/Self-Care Activities: Patient will attend all scheduled provider appointments as evidenced by clinician review of documented attendance to scheduled appointments and patient/caregiver report Patient will continue to perform ADL's independently as evidenced by patient/caregiver report Patient will call provider office for new concerns or questions as evidenced by review of documented incoming telephone call notes and patient report Check blood pressure weekly Write blood pressure results in a log for provider review Keep medicine where it is safe or locked Use a GPS device in the car Use appliances that have an auto shut-off feature  Keep patient active during the day; do more crossword puzzle Limit naps throughout the day Do one enjoyable thing every day; laugh; watch a funny movie or comedian Eat healthy; do not eat or exercise right before bedtime Get outdoors every day (weather permitting) Practice relaxation or meditation daily Spend time or talk with others at least 2 to 3 times per week      Plan:The care management team will reach out to the patient again over the next 10 business days.  Hubert Azure RN, MSN RN Care Management Coordinator Section [phone] [email or web address]

## 2021-07-12 ENCOUNTER — Telehealth: Payer: Self-pay | Admitting: Internal Medicine

## 2021-07-12 NOTE — Telephone Encounter (Signed)
Patient's wife calling in and states she has been calling and trying to get the Patient scheduled for his GI appointment. Referral was made and she states they missed the initial call.   They have been calling back since and she states they continue to get a office closed message. Wanting to know if anything new needs to be done for the Patient to get this scheduled?   Please advise

## 2021-07-13 NOTE — Telephone Encounter (Signed)
Noted  

## 2021-07-14 ENCOUNTER — Ambulatory Visit: Payer: PPO | Admitting: *Deleted

## 2021-07-14 DIAGNOSIS — Z6841 Body Mass Index (BMI) 40.0 and over, adult: Secondary | ICD-10-CM

## 2021-07-14 DIAGNOSIS — F422 Mixed obsessional thoughts and acts: Secondary | ICD-10-CM

## 2021-07-14 DIAGNOSIS — F0153 Vascular dementia, unspecified severity, with mood disturbance: Secondary | ICD-10-CM

## 2021-07-14 DIAGNOSIS — R5383 Other fatigue: Secondary | ICD-10-CM

## 2021-07-14 DIAGNOSIS — R413 Other amnesia: Secondary | ICD-10-CM

## 2021-07-14 DIAGNOSIS — I10 Essential (primary) hypertension: Secondary | ICD-10-CM

## 2021-07-14 DIAGNOSIS — M25571 Pain in right ankle and joints of right foot: Secondary | ICD-10-CM

## 2021-07-14 DIAGNOSIS — G8929 Other chronic pain: Secondary | ICD-10-CM

## 2021-07-14 DIAGNOSIS — F32A Depression, unspecified: Secondary | ICD-10-CM

## 2021-07-14 NOTE — Chronic Care Management (AMB) (Signed)
Chronic Care Management   CCM RN Visit Note  07/14/2021 Name: Paul Cook MRN: [id number] DOB: 11-02-1950  Subjective: Paul Cook is a 70 y.o. year old male who is a primary care patient of McLean-Scocuzza, [name], MD. The care management team was consulted for assistance with disease management and care coordination needs.    Engaged with patient by telephone for follow up visit in response to provider referral for case management and/or care coordination services.   Consent to Services:  The patient was given information about Chronic Care Management services, agreed to services, and gave verbal consent prior to initiation of services.  Please see initial visit note for detailed documentation.   Patient agreed to services and verbal consent obtained.   Assessment: Review of patient past medical history, allergies, medications, health status, including review of consultants reports, laboratory and other test data, was performed as part of comprehensive evaluation and provision of chronic care management services.   SDOH (Social Determinants of Health) assessments and interventions performed:    CCM Care Plan  Allergies  Allergen Reactions   Nsaids Other (See Comments)    GI Issues    Outpatient Encounter Medications as of 07/14/2021  Medication Sig Note   amLODipine (NORVASC) 5 MG tablet Take 1 tablet (5 mg total) by mouth daily.    ezetimibe (ZETIA) 10 MG tablet Take 1 tablet (10 mg total) by mouth daily.    gabapentin (NEURONTIN) 600 MG tablet TAKE 1 TABLET(600 MG) BY MOUTH THREE TIMES DAILY 07/02/2021: Taking 1/2 tablet once daily   lisinopril-hydrochlorothiazide (ZESTORETIC) 20-12.5 MG tablet TAKE 2 TABLETS BY MOUTH DAILY    metoprolol succinate (TOPROL-XL) 25 MG 24 hr tablet TAKE 1 TABLET(25 MG) BY MOUTH DAILY    rOPINIRole (REQUIP) 2 MG tablet TAKE 3 TABLETS(6 MG) BY MOUTH AT BEDTIME    No facility-administered encounter medications on file as of 07/14/2021.    Patient  Active Problem List   Diagnosis Date Noted   OCD (obsessive compulsive disorder) 07/04/2021   Elevated liver enzymes 07/04/2021   Vascular dementia with depressed mood ([place]) 07/02/2021   DOE (dyspnea on exertion) 08/06/2020   Abnormal EKG 08/06/2020   Coronary artery calcification seen on CT scan 07/13/2020   Coronary atherosclerosis due to calcified coronary lesion 06/10/2020   Aortic atherosclerosis ([place]) 06/10/2020   Lung nodule seen on imaging study 06/10/2020   Cyst of left kidney 05/29/2020   Gallstone 05/29/2020   Fatty liver 05/29/2020   Bilateral leg edema 05/15/2020   Venous stasis dermatitis of right lower extremity 05/15/2020   Right ankle swelling 12/17/2019   HLD (hyperlipidemia) 11/09/2018   History of alcohol abuse 09/06/2018   Chronic pain of right ankle 09/06/2018   Vitamin D deficiency 06/29/2018   Prediabetes 06/28/2018   Plantar fasciitis 08/29/2016   GERD (gastroesophageal reflux disease) 12/18/2015   Restless legs 03/19/2015   Essential hypertension 01/14/2014   Obstructive sleep apnea 12/17/2013   IBS (irritable bowel syndrome) 07/30/2013   Insomnia 06/12/2013   Cervical disc disorder with radiculopathy of cervical region 06/12/2013   Morbid obesity with BMI of 50.0-59.9, adult ([place]) 06/12/2013   Seasonal allergies 06/12/2013   Anxiety and depression 06/12/2013    Conditions to be addressed/monitored:Dementia  Care Plan : CCM RNCM Care Plan  Updates made by Leona Singleton, RN since 07/14/2021 12:00 AM     Problem: Increasing forgetfulness related to dementia causing dificulties with self cae management of hypertension   Priority: Medium     Long-Range  Goal: Patient and wife will work with CCM team to assist in learning more about dementia to help self care management of cormobidities   Start Date: 07/09/2021  Expected End Date: 02/13/2022  This Visit's Progress: Not on track  Priority: Medium  Note:   Current Barriers:  Knowledge Deficits  related to plan of care for management of HTN and Dementia  Care Coordination needs related to Cognitive Deficits, Memory Deficits, and assistance arranging GI appointment  Cognitive Deficits;   History of hypertension and not monitoring blood pressures at home.  Wife has noticed increase in confusion for patient.  Lives at home with wife, currently at home alone, whole wife recovers from hip surgery at daughters house.  Per wife, patient continues to drive; does not feel well today, has cold and bad cough.  Wife continues to manage meds through pill box, although he missed multiple days a few weeks ago while she was not home, nut states it has gotten better.  Has GI consultation appointment she is having trouble arranging.  Spoke with wife, not patient.  States patient feels better cold wise less week; but is more confused (forgot hi sister in law was deceased and how to get to his daughters house).  Wife hoping to come home soon,  Was helping clean the home yesterday and found empty beer cans, she is afraid patient may have started back drinking.  Discussed reasoning for GI referral and appointment is scheduled for 11/15  RNCM Clinical Goal(s):  Patient will verbalize understanding of plan for management of HTN and Dementia  take all medications exactly as prescribed and will call provider for medication related questions continue to work with RN Care Manager to address care management and care coordination needs related to HTN and Dementia  work with Education officer, museum to address [place] Concerns , Cognitive Deficits, and Memory Deficits related to the management of Dementia through collaboration with RN Care manager, provider, and care team.  Wife will report monitoring blood pressure at home weekly within the next 45 days.  Interventions: 1:1 collaboration with primary care provider regarding development and update of comprehensive plan of care as evidenced by provider attestation and  co-signature Inter-disciplinary care team collaboration (see longitudinal plan of care) Evaluation of current treatment plan related to  self management and patient's adherence to plan as established by provider  Dementia  (Status: New goal. Goal on track: NO.) Evaluation of current treatment plan related to Dementia, Limited education about Dementia*, Cognitive Deficits, and Memory Deficits self-management and patient's adherence to plan as established by provider. Discussed plans with patient for ongoing care management follow up and provided patient with direct contact information for care management team Provided education to patient re: Dementia; Discussed plans with patient for ongoing care management follow up and provided patient with direct contact information for care management team; Advised patient to discuss diagnosis and medications with provider; Assessed social determinant of health barriers;  Schedule appointment with Neurologist to discuss diagnosis and possible treatment  Hypertension: (Status: New goal. Condition stable. Not addressed this visit.) Last practice recorded BP readings:  BP Readings from Last 3 Encounters:  07/02/21 140/88  10/30/20 134/78  08/05/20 138/70  Most recent eGFR/CrCl: No results found for: EGFR  No components found for: CRCL  Evaluation of current treatment plan related to hypertension self management and patient's adherence to plan as established by provider;   Reviewed medications with patient and discussed importance of compliance;  Advised patient, providing education and rationale,  to monitor blood pressure daily and record, calling PCP for findings outside established parameters;  Discussed complications of poorly controlled blood pressure such as heart disease, stroke, circulatory complications, vision complications, kidney impairment, sexual dysfunction;   Patient Goals/Self-Care Activities: Patient will attend all scheduled provider  appointments as evidenced by clinician review of documented attendance to scheduled appointments and patient/caregiver report Patient will continue to perform ADL's independently as evidenced by patient/caregiver report Patient will call provider office for new concerns or questions as evidenced by review of documented incoming telephone call notes and patient report Check blood pressure weekly Write blood pressure results in a log for provider review Keep medicine where it is safe or locked Use a GPS device in the car Use appliances that have an auto shut-off feature  Keep patient active during the day; do more crossword puzzle Limit naps throughout the day Do one enjoyable thing every day; laugh; watch a funny movie or comedian Eat healthy; do not eat or exercise right before bedtime Get outdoors every day (weather permitting) Practice relaxation or meditation daily Spend time or talk with others at least 2 to 3 times per week Schedule appointment with Neurology and attend GI appointment      Plan:The care management team will reach out to the patient again over the next 30 days.  Hubert Azure RN, MSN RN Care Management Coordinator [place] [phone] [email or web address]

## 2021-07-14 NOTE — Chronic Care Management (AMB) (Signed)
Chronic Care Management    Clinical Social Work Note  07/14/2021 Name: Paul Cook MRN: [id number] DOB: [birth year]  Paul Cook is a 70 y.o. year old male who is a primary care patient of McLean-Scocuzza, [name], MD. The CCM team was consulted to assist the patient with chronic disease management and/or care coordination needs related to: Level of Care Concerns and Caregiver Stress.   Engaged with patient's wife by telephone for follow-up visit in response to provider referral for social work chronic care management and care coordination services.   Consent to Services:  The patient was given information about Chronic Care Management services, agreed to services, and gave verbal consent prior to initiation of services.  Please see initial visit note for detailed documentation.   Patient agreed to services and consent obtained.   Assessment: Review of patient past medical history, allergies, medications, and health status, including review of relevant consultants reports was performed today as part of a comprehensive evaluation and provision of chronic care management and care coordination services.     SDOH (Social Determinants of Health) assessments and interventions performed:    Advanced Directives Status: Not addressed in this encounter.  CCM Care Plan  Allergies  Allergen Reactions   Nsaids Other (See Comments)    GI Issues    Outpatient Encounter Medications as of 07/14/2021  Medication Sig Note   amLODipine (NORVASC) 5 MG tablet Take 1 tablet (5 mg total) by mouth daily.    ezetimibe (ZETIA) 10 MG tablet Take 1 tablet (10 mg total) by mouth daily.    gabapentin (NEURONTIN) 600 MG tablet TAKE 1 TABLET(600 MG) BY MOUTH THREE TIMES DAILY 07/02/2021: Taking 1/2 tablet once daily   lisinopril-hydrochlorothiazide (ZESTORETIC) 20-12.5 MG tablet TAKE 2 TABLETS BY MOUTH DAILY    metoprolol succinate (TOPROL-XL) 25 MG 24 hr tablet TAKE 1 TABLET(25 MG) BY MOUTH DAILY    rOPINIRole  (REQUIP) 2 MG tablet TAKE 3 TABLETS(6 MG) BY MOUTH AT BEDTIME    No facility-administered encounter medications on file as of 07/14/2021.    Patient Active Problem List   Diagnosis Date Noted   OCD (obsessive compulsive disorder) 07/04/2021   Elevated liver enzymes 07/04/2021   Vascular dementia with depressed mood ([place]) 07/02/2021   DOE (dyspnea on exertion) 08/06/2020   Abnormal EKG 08/06/2020   Coronary artery calcification seen on CT scan 07/13/2020   Coronary atherosclerosis due to calcified coronary lesion 06/10/2020   Aortic atherosclerosis ([place]) 06/10/2020   Lung nodule seen on imaging study 06/10/2020   Cyst of left kidney 05/29/2020   Gallstone 05/29/2020   Fatty liver 05/29/2020   Bilateral leg edema 05/15/2020   Venous stasis dermatitis of right lower extremity 05/15/2020   Right ankle swelling 12/17/2019   HLD (hyperlipidemia) 11/09/2018   History of alcohol abuse 09/06/2018   Chronic pain of right ankle 09/06/2018   Vitamin D deficiency 06/29/2018   Prediabetes 06/28/2018   Plantar fasciitis 08/29/2016   GERD (gastroesophageal reflux disease) 12/18/2015   Restless legs 03/19/2015   Essential hypertension 01/14/2014   Obstructive sleep apnea 12/17/2013   IBS (irritable bowel syndrome) 07/30/2013   Insomnia 06/12/2013   Cervical disc disorder with radiculopathy of cervical region 06/12/2013   Morbid obesity with BMI of 50.0-59.9, adult ([place]) 06/12/2013   Seasonal allergies 06/12/2013   Anxiety and depression 06/12/2013    Conditions to be addressed/monitored: Vascular Dementia with Depressed Mood and Caregiver Stress.  Limited Social Support, Limited Access to Caregiver, Social Isolation, Cognitive Deficits, Memory Deficits  and ADL/IADL Limitations.  Care Plan : LCSW Plan of Care  Updates made by Francis Gaines, LCSW since 07/14/2021 12:00 AM     Problem: Find Help in My Community. Resolved 07/14/2021  Priority: High     Goal: Find Help in My Community.  Completed 07/14/2021  Start Date: 07/07/2021  Expected End Date: 07/14/2021  This Visit's Progress: On track  Recent Progress: On track  Priority: High  Note:   Current Barriers:   Patient with Vascular Dementia with Depressed Mood, Cervical Disc Disorder with Radiculopathy of Cervical Region, Morbid Obesity, Chronic Pain of Right Ankle and Bilateral Leg Edema needs Support, Education, Resources, Referrals and Care Coordination to resolve unmet personal care needs in the home. Clinical Goals:  Patient will have in-home care services in place, either through Rosealee Albee, Education officer, museum II with the [place] [facility], or through a private agency of choice. Clinical Interventions: Caregiver Stress Acknowledged and Consideration of In-Home Care Services Encouraged. Collaboration with Rosealee Albee, Social Worker II with the [place] [facility], to confirm receipt of application for processing.   Collaboration with Primary Care Provider, Dr. [name] McLean-Scocuzza's covering practice physicians at [facility], to inquire about order for CPAP Machine, to request a Neurology Consult, a Sleep Study, a Psychiatric Evaluation, and new practice provider, per Mrs. Vassar's request. Patient Goals/Self-Care Activities: Mrs. Pharo will continue to follow-up with Rosealee Albee, Social Worker II with the [place] [facility] [phone] or # [phone]), to check the status of your application process. Mrs. Goostree was encouraged to self-enroll in a Caregiver Support Group. Mrs. Todorov agreed to contact LCSW directly (# [phone]), if she has questions, needs assistance, or if additional social work needs are identified within the near future.   Follow-Up Plan:  No Follow-Up Required, Per Wife.    Nat Christen  LCSW Licensed Clinical Social Worker [place]  [phone]

## 2021-07-14 NOTE — Patient Instructions (Signed)
Visit Information  PATIENT GOALS:  Goals Addressed             This Visit's Progress    COMPLETED: Find Help in My Community.   On track    Timeframe:  Short-Term Goal Priority:  High Start Date:   07/07/2021                       Expected End Date:  07/14/2021              Follow-Up Date:  No Follow-Up Required, Per Wife.    Patient Goals/Self-Care Activities:  Mrs. Heidinger will continue to follow-up with Rosealee Albee, Social Worker II with the [place] [facility] [phone] or # [phone]), to check the status of your application process. Mrs. Pernell was encouraged to self-enroll in a Caregiver Support Group. Mrs. Willmore agreed to contact LCSW directly (# [phone]), if she has questions, needs assistance, or if additional social work needs are identified within the near future.          Patient verbalizes understanding of instructions provided today and agrees to view in [place].   No Follow-Up Required, Per Wife.  Nat Christen LCSW Licensed Clinical Social Worker [place]  [phone]

## 2021-07-14 NOTE — Patient Instructions (Signed)
Visit Information  PATIENT GOALS:  Goals Addressed             This Visit's Progress    (RNCM)  Keeping Myself/Loved One Safe-Dementia   Not on track    Timeframe:  Long-Range Goal Priority:  Medium Start Date:  07/09/21                           Expected End Date:  02/13/22                    Barriers: Knowledge Psychological Impairment Psychosocial  Follow Up Date 07/14/2021    Keep medicine where it is safe or locked Use a GPS device in the car Use appliances that have an auto shut-off feature  Keep patient active during the day; do more crossword puzzle Limit naps throughout the day Do one enjoyable thing every day; laugh; watch a funny movie or comedian Eat healthy; do not eat or exercise right before bedtime Get outdoors every day (weather permitting) Practice relaxation or meditation daily Spend time or talk with others at least 2 to 3 times per week   Why is this important?   Safety is important when you or your loved one has dementia.  Eyesight, hearing and changes in feelings of hot and cold or depth-perception may occur.  Wandering or getting lost may happen.  You/your loved one may have to stop driving.  Taking steps to improve safety can prevent injuries.  It will also help you/your loved one feel more relaxed.  It will help you/your loved one be independent for a longer time.     Notes:      (RNCM)  Track and Manage My Blood Pressure-Hypertension   Not on track    Timeframe:  Long-Range Goal Priority:  Medium Start Date:    07/09/21                         Expected End Date: 02/13/22                     Barriers: Knowledge Psychological Impairment Psychosocial  Follow Up Date 07/14/21    Check blood pressure weekly Write blood pressure results in a log for provider review   Why is this important?   You won't feel high blood pressure, but it can still hurt your blood vessels.  High blood pressure can cause heart or kidney problems. It can also  cause a stroke.  Making lifestyle changes like losing a little weight or eating less salt will help.  Checking your blood pressure at home and at different times of the day can help to control blood pressure.  If the doctor prescribes medicine remember to take it the way the doctor ordered.  Call the office if you cannot afford the medicine or if there are questions about it.     Notes:         Patient verbalizes understanding of instructions provided today and agrees to view in [place].   The care management team will reach out to the patient again over the next 30 days.   Hubert Azure RN, MSN RN Care Management Coordinator [place] [phone] [email or web address]

## 2021-07-15 ENCOUNTER — Telehealth: Payer: Self-pay | Admitting: Podiatry

## 2021-07-15 NOTE — Telephone Encounter (Signed)
Received hta auth # [id number] for orthotics(L3020 X2) valid 9.28.2022 thru 12.27.2022

## 2021-07-16 ENCOUNTER — Other Ambulatory Visit: Payer: PPO

## 2021-07-16 DIAGNOSIS — F32A Depression, unspecified: Secondary | ICD-10-CM | POA: Diagnosis not present

## 2021-07-16 DIAGNOSIS — F0150 Vascular dementia without behavioral disturbance: Secondary | ICD-10-CM

## 2021-07-16 DIAGNOSIS — F419 Anxiety disorder, unspecified: Secondary | ICD-10-CM | POA: Diagnosis not present

## 2021-07-16 DIAGNOSIS — I10 Essential (primary) hypertension: Secondary | ICD-10-CM | POA: Diagnosis not present

## 2021-07-20 ENCOUNTER — Ambulatory Visit (INDEPENDENT_AMBULATORY_CARE_PROVIDER_SITE_OTHER): Payer: PPO | Admitting: Pharmacist

## 2021-07-20 DIAGNOSIS — I10 Essential (primary) hypertension: Secondary | ICD-10-CM

## 2021-07-20 DIAGNOSIS — I2510 Atherosclerotic heart disease of native coronary artery without angina pectoris: Secondary | ICD-10-CM

## 2021-07-20 DIAGNOSIS — F0153 Vascular dementia, unspecified severity, with mood disturbance: Secondary | ICD-10-CM

## 2021-07-20 DIAGNOSIS — G4700 Insomnia, unspecified: Secondary | ICD-10-CM

## 2021-07-20 DIAGNOSIS — I2584 Coronary atherosclerosis due to calcified coronary lesion: Secondary | ICD-10-CM

## 2021-07-20 DIAGNOSIS — E785 Hyperlipidemia, unspecified: Secondary | ICD-10-CM

## 2021-07-20 MED ORDER — METOPROLOL SUCCINATE ER 25 MG PO TB24: ORAL_TABLET | ORAL | 1 refills | Status: DC

## 2021-07-20 MED ORDER — LISINOPRIL-HYDROCHLOROTHIAZIDE 20-12.5 MG PO TABS
2.0000 | ORAL_TABLET | Freq: Every day | ORAL | 1 refills | Status: DC
Start: 2021-07-20 — End: 2021-10-12

## 2021-07-20 MED ORDER — ASPIRIN EC 81 MG PO TBEC: 81.0000 mg | DELAYED_RELEASE_TABLET | Freq: Every day | ORAL | 1 refills | Status: DC

## 2021-07-20 MED ORDER — AMLODIPINE BESYLATE 5 MG PO TABS: 5.0000 mg | ORAL_TABLET | Freq: Every day | ORAL | 1 refills | Status: DC

## 2021-07-20 MED ORDER — EZETIMIBE 10 MG PO TABS: 10.0000 mg | ORAL_TABLET | Freq: Every day | ORAL | 1 refills | Status: DC

## 2021-07-20 NOTE — Patient Instructions (Signed)
Visit Information  PATIENT GOALS:  Goals Addressed               This Visit's Progress     Patient Stated     (PharmD) Manage Medications (pt-stated)        Patient Goals/Self-Care Activities Over the next 90 days, patient will:  - take medications as prescribed focus on medication adherence by collaborating with retail pharmacy on adherence packaging         Patient verbalizes understanding of instructions provided today and agrees to view in [place].   Plan: Face to Face appointment with care management team member scheduled for: 1 week  Catie Darnelle Maffucci, PharmD, [place], [place] Clinical Pharmacist [employer] at [employer] [phone]

## 2021-07-20 NOTE — Chronic Care Management (AMB) (Signed)
Chronic Care Management Pharmacy Note  07/20/2021 Name:  Paul Cook MRN:  [id number] DOB:  03/05/1951   Subjective: Paul Cook is an 70 y.o. year old male who is a primary patient of McLean-Scocuzza, [name], MD.  Collaborating with covering provider while PCP is on leave. The CCM team was consulted for assistance with disease management and care coordination needs.    Engaged with patient's wife, Paul Cook by telephone for initial pharmacy visit in response to provider referral for pharmacy case management and/or care coordination services.   Consent to Services:  The patient was given information about Chronic Care Management services, agreed to services, and gave verbal consent prior to initiation of services.  Please see initial visit note for detailed documentation.   Patient Care Team: McLean-Scocuzza, [name], MD as PCP - General (Internal Medicine) End, Harrell Gave, MD as PCP - Cardiology (Cardiology) De Hollingshead, RPH-CPP (Pharmacist) Leona Singleton, RN as Case Manager   Objective:  Lab Results  Component Value Date   CREATININE 0.91 07/02/2021   CREATININE 0.98 05/15/2020   CREATININE 0.92 01/23/2020    Lab Results  Component Value Date   HGBA1C 6.1 07/02/2021   Last diabetic Eye exam: No results found for: HMDIABEYEEXA  Last diabetic Foot exam: No results found for: HMDIABFOOTEX      Component Value Date/Time   CHOL 149 07/02/2021 1041   TRIG 81.0 07/02/2021 1041   HDL 51.00 07/02/2021 1041   CHOLHDL 3 07/02/2021 1041   VLDL 16.2 07/02/2021 1041   LDLCALC 82 07/02/2021 1041    Hepatic Function Latest Ref Rng & Units 07/02/2021 05/15/2020 01/23/2020  Total Protein 6.0 - 8.3 g/dL 6.3 6.6 6.6  Albumin 3.5 - 5.2 g/dL 4.0 - 4.2  AST 0 - 37 U/L 67(H) 42(H) 59(H)  ALT 0 - 53 U/L 95(H) 52(H) 84(H)  Alk Phosphatase 39 - 117 U/L 59 - 81  Total Bilirubin 0.2 - 1.2 mg/dL 0.7 0.7 0.6  Bilirubin, Direct 0.0 - 0.3 mg/dL - - 0.1    Lab Results  Component  Value Date/Time   TSH 2.21 07/09/2019 10:53 AM   TSH 2.88 06/28/2018 10:48 AM    CBC Latest Ref Rng & Units 07/02/2021 05/15/2020 07/09/2019  WBC 4.0 - 10.5 K/uL 8.4 10.6 9.3  Hemoglobin 13.0 - 17.0 g/dL 14.7 16.0 14.7  Hematocrit 39.0 - 52.0 % 44.4 47.2 44.0  Platelets 150.0 - 400.0 K/uL 166.0 185 176.0    Lab Results  Component Value Date/Time   VD25OH 49.99 07/09/2019 10:53 AM   VD25OH 22.92 (L) 06/28/2018 10:48 AM    Social History   Tobacco Use  Smoking Status Never   Passive exposure: Never  Smokeless Tobacco Never   BP Readings from Last 3 Encounters:  07/02/21 140/88  10/30/20 134/78  08/05/20 138/70   Pulse Readings from Last 3 Encounters:  07/02/21 81  10/30/20 72  08/05/20 73   Wt Readings from Last 3 Encounters:  07/02/21 (!) 325 lb 9.6 oz (147.7 kg)  10/30/20 (!) 314 lb 12.8 oz (142.8 kg)  08/05/20 (!) 311 lb (141.1 kg)    Assessment: Review of patient past medical history, allergies, medications, health status, including review of consultants reports, laboratory and other test data, was performed as part of comprehensive evaluation and provision of chronic care management services.   SDOH:  (Social Determinants of Health) assessments and interventions performed:  SDOH Interventions    Flowsheet Row Most Recent Value  SDOH Interventions   Financial  Strain Interventions Patient Refused       CCM Care Plan  Allergies  Allergen Reactions   Nsaids Other (See Comments)    GI Issues    Medications Reviewed Today     Reviewed by De Hollingshead, RPH-CPP (Pharmacist) on 07/20/21 at 1338  Med List Status: <None>   Medication Order Taking? Sig Documenting Provider Last Dose Status Informant  amLODipine (NORVASC) 5 MG tablet [id number] Yes Take 1 tablet (5 mg total) by mouth daily. McLean-Scocuzza, [name], MD Taking Active   ezetimibe (ZETIA) 10 MG tablet [id number] Yes Take 1 tablet (10 mg total) by mouth daily. McLean-Scocuzza, [name], MD Taking  Active   gabapentin (NEURONTIN) 600 MG tablet [id number] Yes TAKE 1 TABLET(600 MG) BY MOUTH THREE TIMES DAILY McLean-Scocuzza, [name], MD Taking Active            Med Note De Hollingshead   Tue Jul 20, 2021  1:37 PM) 300 mg QPM  lisinopril-hydrochlorothiazide (ZESTORETIC) 20-12.5 MG tablet [id number] Yes TAKE 2 TABLETS BY MOUTH DAILY McLean-Scocuzza, [name], MD Taking Active   metoprolol succinate (TOPROL-XL) 25 MG 24 hr tablet [id number] Yes TAKE 1 TABLET(25 MG) BY MOUTH DAILY McLean-Scocuzza, [name], MD Taking Active   rOPINIRole (REQUIP) 2 MG tablet [id number] Yes TAKE 3 TABLETS(6 MG) BY MOUTH AT BEDTIME McLean-Scocuzza, [name], MD Taking Active             Patient Active Problem List   Diagnosis Date Noted   OCD (obsessive compulsive disorder) 07/04/2021   Elevated liver enzymes 07/04/2021   Vascular dementia with depressed mood 07/02/2021   DOE (dyspnea on exertion) 08/06/2020   Abnormal EKG 08/06/2020   Coronary artery calcification seen on CT scan 07/13/2020   Coronary atherosclerosis due to calcified coronary lesion 06/10/2020   Aortic atherosclerosis ([place]) 06/10/2020   Lung nodule seen on imaging study 06/10/2020   Cyst of left kidney 05/29/2020   Gallstone 05/29/2020   Fatty liver 05/29/2020   Bilateral leg edema 05/15/2020   Venous stasis dermatitis of right lower extremity 05/15/2020   Right ankle swelling 12/17/2019   HLD (hyperlipidemia) 11/09/2018   History of alcohol abuse 09/06/2018   Chronic pain of right ankle 09/06/2018   Vitamin D deficiency 06/29/2018   Prediabetes 06/28/2018   Plantar fasciitis 08/29/2016   GERD (gastroesophageal reflux disease) 12/18/2015   Restless legs 03/19/2015   Essential hypertension 01/14/2014   Obstructive sleep apnea 12/17/2013   IBS (irritable bowel syndrome) 07/30/2013   Insomnia 06/12/2013   Cervical disc disorder with radiculopathy of cervical region 06/12/2013   Morbid obesity with BMI of 50.0-59.9, adult ([place])  06/12/2013   Seasonal allergies 06/12/2013   Anxiety and depression 06/12/2013    Immunization History  Administered Date(s) Administered   Fluad Quad(high Dose 65+) 07/22/2019, 07/01/2020, 07/02/2021   Influenza Whole 06/19/2013   Influenza, High Dose Seasonal PF 06/28/2018   Influenza,inj,Quad PF,6+ Mos 07/15/2014, 07/18/2016   Influenza-Unspecified 06/18/2015   PFIZER(Purple Top)SARS-COV-2 Vaccination 12/03/2019, 12/24/2019   Pneumococcal Conjugate-13 03/30/2017   Pneumococcal Polysaccharide-23 04/04/2018   Tdap 04/17/2014   Zoster, Live 12/18/2013    Conditions to be addressed/monitored: CAD, HTN, and HLD  Care Plan : Medication Management  Updates made by De Hollingshead, RPH-CPP since 07/20/2021 12:00 AM     Problem: Vascular Dementia, CAD, Hypertension      Long-Range Goal: Disease Progression Prevention   Start Date: 07/20/2021  This Visit's Progress: On track  Priority: High  Note:   Current  Barriers:  Unable to independently monitor therapeutic efficacy Does not adhere to prescribed medication regimen Insufficient treatment for ASCVD risk reduction  Pharmacist Clinical Goal(s):  Over the next 90 days, patient will achieve adherence to monitoring guidelines and medication adherence to achieve therapeutic efficacy achieve ability to self administer medications as prescribed through use of adherence packaging as evidenced by patient report through collaboration with PharmD and provider.    Interventions: 1:1 collaboration with McLean-Scocuzza, [name], MD regarding development and update of comprehensive plan of care as evidenced by provider attestation and co-signature Inter-disciplinary care team collaboration (see longitudinal plan of care) Comprehensive medication review performed; medication list updated in electronic medical record  Medication Management: Often forgetting to take medications. Wife has been staying with daughter after her hip  replacement, but she should be home in the next several days. She will help remind to take medications. She asks about a pharmacy that can help manage medications, refill, adherence packing. She asks if [facility] does this, as her mother used to use this pharmacy and she likes them. Collaborated w/ patient, Sam, PharmD at [facility] to have chronic medications sent to that pharmacy. Patient sees neurology later this week, they will ask for them to send refills on gabapentin, ropinirole to [facility]  Hypertension:  Goal on track: NO. Current treatment: amlodipine 5 mg daily, lisinopril/HCTZ 20/12.5 mg, taking 2 tablets QAM, metoprolol succinate 25 mg daily;  Current home readings: did not discuss today Discussed adherence strategy as above. Will discuss home monitoring and BP optimization moving forward.   Hyperlipidemia:  Goal on track: NO. Uncontrolled given ASCVD; current treatment: ezetimibe 10 mg daily;  Medications previously tried: atorvastatin 10 mg - appears he "did not want to take" previously so was discontinued. Given ASCVD and vascular dementia, strongly recommend statin therapy. Most recent lab work showed elevated liver enzymes, appointment with GI upcoming. Defer retrial of statin therapy (likely with rosuvastatin) until after GI work up has occurred to avoid confusion.  Patient is not taking aspirin therapy, though this is recommended in neurology's last note with him from 2021. Agree with recommendation given CAD. Notified wife to start this today. Script sent to pharmacy so that they can add this to his adherence packages.   Restless Leg:   Unclear control; current treatment: gabapentin 300 mg - though taking 1/2 of 600 mg tablet; ropinirole 3 mg QPM; follows w/ Dr. Manuella Ghazi  Wife asks if he needs to continue gabapentin. Discussed indication. She reports that he sleeps most of the day, whether in bed or on the couch. Notes that he reports insomnia, but she hasn't been home to really  judge. Encouraged her to discuss w/ Dr. Manuella Ghazi at upcoming appointment.  Recommended to continue current regimen at this time   Patient Goals/Self-Care Activities Over the next 90 days, patient will:  - take medications as prescribed focus on medication adherence by collaborating with retail pharmacy on adherence packaging  Follow Up Plan: Face to Face appointment with care management team member scheduled for:  1 week      Medication Assistance: None required.  Patient affirms current coverage meets needs.  Patient's preferred pharmacy is:  [place], [place] [address] Phone: [phone] Fax: [phone]   Follow Up:  Patient agrees to Care Plan and Follow-up.  Plan: Face to Face appointment with care management team member scheduled for: 1 week  Catie Paul Maffucci, PharmD, [place], CPP  Contractor at [place]

## 2021-07-21 ENCOUNTER — Other Ambulatory Visit: Payer: PPO

## 2021-07-21 DIAGNOSIS — M9902 Segmental and somatic dysfunction of thoracic region: Secondary | ICD-10-CM | POA: Diagnosis not present

## 2021-07-21 DIAGNOSIS — M9901 Segmental and somatic dysfunction of cervical region: Secondary | ICD-10-CM | POA: Diagnosis not present

## 2021-07-21 DIAGNOSIS — M9903 Segmental and somatic dysfunction of lumbar region: Secondary | ICD-10-CM | POA: Diagnosis not present

## 2021-07-21 DIAGNOSIS — M9906 Segmental and somatic dysfunction of lower extremity: Secondary | ICD-10-CM | POA: Diagnosis not present

## 2021-07-22 ENCOUNTER — Ambulatory Visit: Payer: PPO | Admitting: Podiatry

## 2021-07-22 DIAGNOSIS — G4733 Obstructive sleep apnea (adult) (pediatric): Secondary | ICD-10-CM | POA: Diagnosis not present

## 2021-07-22 DIAGNOSIS — Z9989 Dependence on other enabling machines and devices: Secondary | ICD-10-CM | POA: Diagnosis not present

## 2021-07-22 DIAGNOSIS — F1020 Alcohol dependence, uncomplicated: Secondary | ICD-10-CM | POA: Diagnosis not present

## 2021-07-22 DIAGNOSIS — R4189 Other symptoms and signs involving cognitive functions and awareness: Secondary | ICD-10-CM | POA: Diagnosis not present

## 2021-07-26 ENCOUNTER — Other Ambulatory Visit: Payer: PPO

## 2021-07-27 ENCOUNTER — Ambulatory Visit (INDEPENDENT_AMBULATORY_CARE_PROVIDER_SITE_OTHER): Payer: PPO | Admitting: Family Medicine

## 2021-07-27 ENCOUNTER — Other Ambulatory Visit: Payer: Self-pay

## 2021-07-27 DIAGNOSIS — G4733 Obstructive sleep apnea (adult) (pediatric): Secondary | ICD-10-CM | POA: Diagnosis not present

## 2021-07-27 DIAGNOSIS — F0153 Vascular dementia, unspecified severity, with mood disturbance: Secondary | ICD-10-CM

## 2021-07-27 DIAGNOSIS — R748 Abnormal levels of other serum enzymes: Secondary | ICD-10-CM

## 2021-07-27 DIAGNOSIS — F422 Mixed obsessional thoughts and acts: Secondary | ICD-10-CM | POA: Diagnosis not present

## 2021-07-27 DIAGNOSIS — R32 Unspecified urinary incontinence: Secondary | ICD-10-CM | POA: Diagnosis not present

## 2021-07-27 DIAGNOSIS — Z6841 Body Mass Index (BMI) 40.0 and over, adult: Secondary | ICD-10-CM

## 2021-07-27 NOTE — Patient Instructions (Signed)
Nice to see you. You need to work on diet and exercise changes. Please start small with the exercise. It could be as simple as walking around the house. I have included some diet guidelines below.  Please abstain from alcohol.   Diet Recommendations  Starchy (carb) foods: Bread, rice, pasta, potatoes, corn, cereal, grits, crackers, bagels, muffins, all baked goods.  (Fruits, milk, and yogurt also have carbohydrate, but most of these foods will not spike your blood sugar as the starchy foods will.)  A few fruits do cause high blood sugars; use small portions of bananas (limit to 1/2 at a time), grapes, watermelon, oranges, and most tropical fruits.    Protein foods: Meat, fish, poultry, eggs, dairy foods, and beans such as pinto and kidney beans (beans also provide carbohydrate).   1. Eat at least 3 meals and 1-2 snacks per day. Never go more than 4-5 hours while awake without eating. Eat breakfast within the first hour of getting up.   2. Limit starchy foods to TWO per meal and ONE per snack. ONE portion of a starchy  food is equal to the following:   - ONE slice of bread (or its equivalent, such as half of a hamburger bun).   - 1/2 cup of a "scoopable" starchy food such as potatoes or rice.   - 15 grams of carbohydrate as shown on food label.  3. Include at every meal: a protein food, a carb food, and vegetables and/or fruit.   - Obtain twice the volume of veg's as protein or carbohydrate foods for both lunch and dinner.   - Fresh or frozen veg's are best.   - Keep frozen veg's on hand for a quick vegetable serving.

## 2021-07-28 DIAGNOSIS — M9906 Segmental and somatic dysfunction of lower extremity: Secondary | ICD-10-CM | POA: Diagnosis not present

## 2021-07-28 DIAGNOSIS — M9903 Segmental and somatic dysfunction of lumbar region: Secondary | ICD-10-CM | POA: Diagnosis not present

## 2021-07-28 DIAGNOSIS — M9902 Segmental and somatic dysfunction of thoracic region: Secondary | ICD-10-CM | POA: Diagnosis not present

## 2021-07-28 DIAGNOSIS — M9901 Segmental and somatic dysfunction of cervical region: Secondary | ICD-10-CM | POA: Diagnosis not present

## 2021-07-28 DIAGNOSIS — R32 Unspecified urinary incontinence: Secondary | ICD-10-CM | POA: Insufficient documentation

## 2021-07-28 NOTE — Assessment & Plan Note (Addendum)
I encouraged the patient to see GI as planned.  Discussed that he needs to refrain from alcohol use particularly now that he has alcohol in the last week or so.  Discussed the risk of withdrawal and death from sudden alcohol cessation.  Discussed minimizing Tylenol use.

## 2021-07-28 NOTE — Assessment & Plan Note (Signed)
I suspect this is related to the patient holding his urine for too long and then not being able to get to the bathroom.  Discussed timed voiding every couple of hours to try to prevent this.  Discussed that medication would not be recommended at this time as the risks would likely outweigh any benefit to the patient.

## 2021-07-28 NOTE — Assessment & Plan Note (Signed)
Psych referral previously placed.  I will send a message to our referral coordinator to check on this.

## 2021-07-28 NOTE — Progress Notes (Signed)
Tommi Rumps, MD Phone: [phone]  Paul Cook is a 70 y.o. male who presents today for f/u.  Dementia: Patient's wife and the patient both note some days are better than others.  He saw neurology and the thought was that he may have vascular dementia mixed with alcohol contributing to his dementia.  They are monitoring his driving.  Neurology ordered occupational therapy for an IADL evaluation.  OSA: Neurology ordered a sleep study though they have not heard anything about this.  Elevated liver enzymes: He has been referred to GI.  He has an appointment scheduled.  He denies recent use of alcohol.  He was previously drinking up to 8 alcoholic beverages per day per his wife.  He notes no right upper quadrant pain.  Does not use excessive amounts of Tylenol.  Obesity: Patient has not been doing much exercise.  He tries to go up and down the stairs when he is at home.  He has been eating more eggs and frozen meals.  His wife has been out of the house related to a hip surgery and has not been cooking much.  He does eat a lot of cheese and meats.  Occasional vegetables if they are cooked.  Incontinence: His wife and the patient both endorse that the patient has incontinence issues.  The wife states they are worse than what the patient says.  The patient notes that if he holds his urine for too long he ends up not being able to get to the bathroom quickly enough.  He does not have any incontinence at night.  Patient's wife also has concerns for OCD.  Patient cuts of his close.  It looks like a psychiatry referral was placed previously for this.  Social History   Tobacco Use  Smoking Status Never   Passive exposure: Never  Smokeless Tobacco Never    Current Outpatient Medications on File Prior to Visit  Medication Sig Dispense Refill   amLODipine (NORVASC) 5 MG tablet Take 1 tablet (5 mg total) by mouth daily. 90 tablet 1   aspirin EC 81 MG tablet Take 1 tablet (81 mg total) by mouth  daily. Swallow whole. 90 tablet 1   ezetimibe (ZETIA) 10 MG tablet Take 1 tablet (10 mg total) by mouth daily. 90 tablet 1   gabapentin (NEURONTIN) 600 MG tablet TAKE 1 TABLET(600 MG) BY MOUTH THREE TIMES DAILY 270 tablet 0   lisinopril-hydrochlorothiazide (ZESTORETIC) 20-12.5 MG tablet Take 2 tablets by mouth daily. 180 tablet 1   metoprolol succinate (TOPROL-XL) 25 MG 24 hr tablet TAKE 1 TABLET(25 MG) BY MOUTH DAILY 90 tablet 1   rOPINIRole (REQUIP) 2 MG tablet TAKE 3 TABLETS(6 MG) BY MOUTH AT BEDTIME 270 tablet 1   No current facility-administered medications on file prior to visit.     ROS see history of present illness  Objective  Physical Exam Vitals:   07/27/21 1308  BP: 118/70  Pulse: 70  Temp: 98.8 F (37.1 C)  SpO2: 96%    BP Readings from Last 3 Encounters:  07/27/21 118/70  07/02/21 140/88  10/30/20 134/78   Wt Readings from Last 3 Encounters:  07/27/21 (!) 320 lb 12.8 oz (145.5 kg)  07/02/21 (!) 325 lb 9.6 oz (147.7 kg)  10/30/20 (!) 314 lb 12.8 oz (142.8 kg)    Physical Exam Constitutional:      General: He is not in acute distress.    Appearance: He is not diaphoretic.  Cardiovascular:     Rate and Rhythm:  Normal rate and regular rhythm.     Heart sounds: Normal heart sounds.  Pulmonary:     Effort: Pulmonary effort is normal.     Breath sounds: Normal breath sounds.  Abdominal:     General: Bowel sounds are normal. There is no distension.     Palpations: Abdomen is soft.     Tenderness: There is no abdominal tenderness.  Skin:    General: Skin is warm and dry.  Neurological:     Mental Status: He is alert.     Assessment/Plan: Please see individual problem list.  Problem List Items Addressed This Visit     Elevated liver enzymes    I encouraged the patient to see GI as planned.  Discussed that he needs to refrain from alcohol use particularly now that he has alcohol in the last week or so.  Discussed the risk of withdrawal and death from  sudden alcohol cessation.  Discussed minimizing Tylenol use.      Morbid obesity with BMI of 50.0-59.9, adult ([place])    I discussed that medication is not recommended for treatment of obesity unless patients have good diet and exercise habits in place.  Encouraged dietary changes.  Discussed trying to increase his exercise progressively.  He was given dietary guidelines in the AVS.      Obstructive sleep apnea    Discussed that if they did not hear anything from neurology on the sleep study in the next several days any to contact neurology.      OCD (obsessive compulsive disorder)    Psych referral previously placed.  I will send a message to our referral coordinator to check on this.      Urine incontinence    I suspect this is related to the patient holding his urine for too long and then not being able to get to the bathroom.  Discussed timed voiding every couple of hours to try to prevent this.  Discussed that medication would not be recommended at this time as the risks would likely outweigh any benefit to the patient.      Vascular dementia with depressed mood    Encouraged patient to continue follow-up with neurology.  Discussed that occupational therapy may be quite beneficial to help evaluate and provide nonmedication treatment recommendations.       Return in about 4 weeks (around 08/24/2021) for with PCP (McLean-Scocuzza).  This visit occurred during the SARS-CoV-2 public health emergency.  Safety protocols were in place, including screening questions prior to the visit, additional usage of staff PPE, and extensive cleaning of exam room while observing appropriate contact time as indicated for disinfecting solutions.   I have spent 35 minutes in the care of this patient regarding history taking, review of recent neurology note, completion of exam, discussion of plan.   Tommi Rumps, MD [place]

## 2021-07-28 NOTE — Assessment & Plan Note (Signed)
I discussed that medication is not recommended for treatment of obesity unless patients have good diet and exercise habits in place.  Encouraged dietary changes.  Discussed trying to increase his exercise progressively.  He was given dietary guidelines in the AVS.

## 2021-07-28 NOTE — Assessment & Plan Note (Signed)
Encouraged patient to continue follow-up with neurology.  Discussed that occupational therapy may be quite beneficial to help evaluate and provide nonmedication treatment recommendations.

## 2021-07-28 NOTE — Assessment & Plan Note (Signed)
Discussed that if they did not hear anything from neurology on the sleep study in the next several days any to contact neurology.

## 2021-08-02 ENCOUNTER — Telehealth: Payer: Self-pay | Admitting: Internal Medicine

## 2021-08-02 NOTE — Telephone Encounter (Signed)
Patient is called in stating that he is supposed to receive a home sleep study kit but he is unsure if it is with our office or his neurologist office.Please advise.

## 2021-08-03 ENCOUNTER — Ambulatory Visit: Payer: PPO | Admitting: Podiatry

## 2021-08-03 NOTE — Telephone Encounter (Signed)
Called to speak with Biran and informed him to contact neurology. Jeziah verbalized understanding and states that he will call inquiring for his sleep study.

## 2021-08-04 ENCOUNTER — Ambulatory Visit: Payer: PPO | Admitting: *Deleted

## 2021-08-04 DIAGNOSIS — F0153 Vascular dementia, unspecified severity, with mood disturbance: Secondary | ICD-10-CM

## 2021-08-04 DIAGNOSIS — I10 Essential (primary) hypertension: Secondary | ICD-10-CM

## 2021-08-04 NOTE — Telephone Encounter (Signed)
Number for Gayland Curry, [place], has been given to patient to call.

## 2021-08-04 NOTE — Telephone Encounter (Signed)
Call neurology 07/22/2021 Office Visit [facility]   [place], [place] [address]   [place], [place], [place]   [facility]   [place], [place] [address]   [phone] (Work)   [phone] ([address]     [place], [place], [place], [place] [address]   [phone] (Work)   [phone] (Fax)   Cognitive impairment (Primary Dx);  OSA on CPAP;  Alcoholism (CMS-HCC

## 2021-08-08 NOTE — Patient Instructions (Signed)
Visit Information  PATIENT GOALS:  Goals Addressed             This Visit's Progress    (RNCM)  Keeping Myself/Loved One Safe-Dementia   Not on track    Timeframe:  Long-Range Goal Priority:  Medium Start Date:  07/09/21                           Expected End Date:  02/13/22                    Barriers: Knowledge Psychological Impairment Psychosocial  Follow Up Date 08/25/2021    Keep medicine where it is safe or locked Use a GPS device in the car Use appliances that have an auto shut-off feature  Keep patient active during the day; do more crossword puzzle Limit naps throughout the day Do one enjoyable thing every day; laugh; watch a funny movie or comedian Eat healthy; do not eat or exercise right before bedtime Get outdoors every day (weather permitting) Practice relaxation or meditation daily Spend time or talk with others at least 2 to 3 times per week   Why is this important?   Safety is important when you or your loved one has dementia.  Eyesight, hearing and changes in feelings of hot and cold or depth-perception may occur.  Wandering or getting lost may happen.  You/your loved one may have to stop driving.  Taking steps to improve safety can prevent injuries.  It will also help you/your loved one feel more relaxed.  It will help you/your loved one be independent for a longer time.     Notes:      (RNCM)  Track and Manage My Blood Pressure-Hypertension   Not on track    Timeframe:  Long-Range Goal Priority:  Medium Start Date:    07/09/21                         Expected End Date: 02/13/22                     Barriers: Knowledge Psychological Impairment Psychosocial  Follow Up Date 08/25/21    Check blood pressure weekly Write blood pressure results in a log for provider review   Why is this important?   You won't feel high blood pressure, but it can still hurt your blood vessels.  High blood pressure can cause heart or kidney problems. It can also  cause a stroke.  Making lifestyle changes like losing a little weight or eating less salt will help.  Checking your blood pressure at home and at different times of the day can help to control blood pressure.  If the doctor prescribes medicine remember to take it the way the doctor ordered.  Call the office if you cannot afford the medicine or if there are questions about it.     Notes:         The patient verbalized understanding of instructions, educational materials, and care plan provided today and declined offer to receive copy of patient instructions, educational materials, and care plan.   The care management team will reach out to the patient again over the next 30 business days.   Hubert Azure RN, MSN RN Care Management Coordinator [place] [phone] [email or web address]

## 2021-08-08 NOTE — Chronic Care Management (AMB) (Signed)
Chronic Care Management   CCM RN Visit Note  08/08/2021 Name: Paul Cook MRN: [id number] DOB: 07-06-1951  Subjective: Paul Cook is a 70 y.o. year old male who is a primary care patient of McLean-Scocuzza, [name], MD. The care management team was consulted for assistance with disease management and care coordination needs.    Engaged with patient by telephone for follow up visit in response to provider referral for case management and/or care coordination services.   Consent to Services:  The patient was given information about Chronic Care Management services, agreed to services, and gave verbal consent prior to initiation of services.  Please see initial visit note for detailed documentation.   Patient agreed to services and verbal consent obtained.   Assessment: Review of patient past medical history, allergies, medications, health status, including review of consultants reports, laboratory and other test data, was performed as part of comprehensive evaluation and provision of chronic care management services.   SDOH (Social Determinants of Health) assessments and interventions performed:    CCM Care Plan  Allergies  Allergen Reactions   Nsaids Other (See Comments)    GI Issues    Outpatient Encounter Medications as of 08/04/2021  Medication Sig Note   amLODipine (NORVASC) 5 MG tablet Take 1 tablet (5 mg total) by mouth daily.    aspirin EC 81 MG tablet Take 1 tablet (81 mg total) by mouth daily. Swallow whole.    ezetimibe (ZETIA) 10 MG tablet Take 1 tablet (10 mg total) by mouth daily.    gabapentin (NEURONTIN) 600 MG tablet TAKE 1 TABLET(600 MG) BY MOUTH THREE TIMES DAILY 07/20/2021: 300 mg QPM   lisinopril-hydrochlorothiazide (ZESTORETIC) 20-12.5 MG tablet Take 2 tablets by mouth daily.    metoprolol succinate (TOPROL-XL) 25 MG 24 hr tablet TAKE 1 TABLET(25 MG) BY MOUTH DAILY    rOPINIRole (REQUIP) 2 MG tablet TAKE 3 TABLETS(6 MG) BY MOUTH AT BEDTIME    No  facility-administered encounter medications on file as of 08/04/2021.    Patient Active Problem List   Diagnosis Date Noted   Urine incontinence 07/28/2021   OCD (obsessive compulsive disorder) 07/04/2021   Elevated liver enzymes 07/04/2021   Vascular dementia with depressed mood 07/02/2021   DOE (dyspnea on exertion) 08/06/2020   Abnormal EKG 08/06/2020   Coronary artery calcification seen on CT scan 07/13/2020   Coronary atherosclerosis due to calcified coronary lesion 06/10/2020   Aortic atherosclerosis ([place]) 06/10/2020   Lung nodule seen on imaging study 06/10/2020   Cyst of left kidney 05/29/2020   Gallstone 05/29/2020   Fatty liver 05/29/2020   Bilateral leg edema 05/15/2020   Venous stasis dermatitis of right lower extremity 05/15/2020   Right ankle swelling 12/17/2019   HLD (hyperlipidemia) 11/09/2018   History of alcohol abuse 09/06/2018   Chronic pain of right ankle 09/06/2018   Vitamin D deficiency 06/29/2018   Prediabetes 06/28/2018   Plantar fasciitis 08/29/2016   GERD (gastroesophageal reflux disease) 12/18/2015   Restless legs 03/19/2015   Essential hypertension 01/14/2014   Obstructive sleep apnea 12/17/2013   IBS (irritable bowel syndrome) 07/30/2013   Insomnia 06/12/2013   Cervical disc disorder with radiculopathy of cervical region 06/12/2013   Morbid obesity with BMI of 50.0-59.9, adult ([place]) 06/12/2013   Seasonal allergies 06/12/2013   Anxiety and depression 06/12/2013    Conditions to be addressed/monitored:HTN and Dementia  Care Plan : [place]  Updates made by Leona Singleton, RN since 08/08/2021 12:00 AM  Problem: Increasing forgetfulness related to dementia causing dificulties with self cae management of hypertension   Priority: Medium     Long-Range Goal: Patient and wife will work with CCM team to assist in learning more about dementia to help self care management of cormobidities   Start Date: 07/09/2021  Expected End Date:  02/13/2022  This Visit's Progress: Not on track  Recent Progress: Not on track  Priority: Medium  Note:   Current Barriers:  Knowledge Deficits related to plan of care for management of HTN and Dementia  Care Coordination needs related to Cognitive Deficits, Memory Deficits, and assistance arranging GI appointment  Cognitive Deficits;   History of hypertension and not monitoring blood pressures at home.  Wife has noticed increase in confusion for patient.  Lives at home with wife, currently at home alone, whole wife recovers from hip surgery at daughters house.  Per wife, patient continues to drive; does not feel well today, has cold and bad cough.  Wife continues to manage meds through pill box, although he missed multiple days a few weeks ago while she was not home, nut states it has gotten better.  Has GI consultation appointment she is having trouble arranging.  Spoke with wife, not patient.  Wife is now home with patient.  Endorses confusion is worse.  Still awaiting GI evaluation.  Has seen neurology and awaiting referrals for sleep study and occupational health referral.  Have not been checking blood pressure but plans to start.  Wife feel patient sleeps a lot during the daytime, encouraged to speak with provider.  RNCM Clinical Goal(s):  Patient will verbalize understanding of plan for management of HTN and Dementia  take all medications exactly as prescribed and will call provider for medication related questions continue to work with RN Care Manager to address care management and care coordination needs related to HTN and Dementia  work with Education officer, museum to address [place] Concerns , Cognitive Deficits, and Memory Deficits related to the management of Dementia through collaboration with RN Care manager, provider, and care team.  Wife will report monitoring blood pressure at home weekly within the next 45 days.  Interventions: 1:1 collaboration with primary care provider regarding  development and update of comprehensive plan of care as evidenced by provider attestation and co-signature Inter-disciplinary care team collaboration (see longitudinal plan of care) Evaluation of current treatment plan related to  self management and patient's adherence to plan as established by provider Attend appointment with GI 08/31/21 Encouraged no alcohol  Dementia  (Status: Goal on track: YES.) Evaluation of current treatment plan related to Dementia, Limited education about Dementia*, Cognitive Deficits, and Memory Deficits self-management and patient's adherence to plan as established by provider. Provided education to patient re: Dementia; Discussed plans with patient for ongoing care management follow up and provided patient with direct contact information for care management team; Advised patient to discuss diagnosis and medications with provider; Assessed social determinant of health barriers;  Follow up with Neurologist to discuss OT referral and sleep study  Hypertension: (Status: Goal on track: NO.) Last practice recorded BP readings:  BP Readings from Last 3 Encounters:  07/02/21 140/88  10/30/20 134/78  08/05/20 138/70  Most recent eGFR/CrCl: No results found for: EGFR  No components found for: CRCL  Evaluation of current treatment plan related to hypertension self management and patient's adherence to plan as established by provider;   Reviewed medications with patient and discussed importance of compliance;  Advised patient, providing education and rationale,  to monitor blood pressure daily and record, calling PCP for findings outside established parameters;  Discussed complications of poorly controlled blood pressure such as heart disease, stroke, circulatory complications, vision complications, kidney impairment, sexual dysfunction;  Encouraged to take and record blood pressures at least weekly for provider review   Patient Goals/Self-Care Activities: Patient will  attend all scheduled provider appointments as evidenced by clinician review of documented attendance to scheduled appointments and patient/caregiver report Patient will continue to perform ADL's independently as evidenced by patient/caregiver report Patient will call provider office for new concerns or questions as evidenced by review of documented incoming telephone call notes and patient report Check blood pressure weekly Write blood pressure results in a log for provider review Keep medicine where it is safe or locked Use a GPS device in the car Use appliances that have an auto shut-off feature  Keep patient active during the day; do more crossword puzzle Limit naps throughout the day Do one enjoyable thing every day; laugh; watch a funny movie or comedian Eat healthy; do not eat or exercise right before bedtime Get outdoors every day (weather permitting) Practice relaxation or meditation daily Spend time or talk with others at least 2 to 3 times per week       Plan:The care management team will reach out to the patient again over the next 30 business days.  Hubert Azure RN, MSN RN Care Management Coordinator [place] [phone] [email or web address]

## 2021-08-09 DIAGNOSIS — G2581 Restless legs syndrome: Secondary | ICD-10-CM | POA: Diagnosis not present

## 2021-08-09 DIAGNOSIS — K760 Fatty (change of) liver, not elsewhere classified: Secondary | ICD-10-CM | POA: Diagnosis not present

## 2021-08-09 DIAGNOSIS — I2510 Atherosclerotic heart disease of native coronary artery without angina pectoris: Secondary | ICD-10-CM | POA: Diagnosis not present

## 2021-08-09 DIAGNOSIS — R2681 Unsteadiness on feet: Secondary | ICD-10-CM | POA: Diagnosis not present

## 2021-08-09 DIAGNOSIS — F1090 Alcohol use, unspecified, uncomplicated: Secondary | ICD-10-CM | POA: Diagnosis not present

## 2021-08-09 DIAGNOSIS — I872 Venous insufficiency (chronic) (peripheral): Secondary | ICD-10-CM | POA: Diagnosis not present

## 2021-08-09 DIAGNOSIS — G4733 Obstructive sleep apnea (adult) (pediatric): Secondary | ICD-10-CM | POA: Diagnosis not present

## 2021-08-09 DIAGNOSIS — F429 Obsessive-compulsive disorder, unspecified: Secondary | ICD-10-CM | POA: Diagnosis not present

## 2021-08-09 DIAGNOSIS — E785 Hyperlipidemia, unspecified: Secondary | ICD-10-CM | POA: Diagnosis not present

## 2021-08-09 DIAGNOSIS — I10 Essential (primary) hypertension: Secondary | ICD-10-CM | POA: Diagnosis not present

## 2021-08-09 DIAGNOSIS — R748 Abnormal levels of other serum enzymes: Secondary | ICD-10-CM | POA: Diagnosis not present

## 2021-08-09 DIAGNOSIS — F419 Anxiety disorder, unspecified: Secondary | ICD-10-CM | POA: Diagnosis not present

## 2021-08-11 ENCOUNTER — Ambulatory Visit: Payer: PPO | Admitting: Internal Medicine

## 2021-08-11 DIAGNOSIS — M9902 Segmental and somatic dysfunction of thoracic region: Secondary | ICD-10-CM | POA: Diagnosis not present

## 2021-08-11 DIAGNOSIS — M9906 Segmental and somatic dysfunction of lower extremity: Secondary | ICD-10-CM | POA: Diagnosis not present

## 2021-08-11 DIAGNOSIS — M9901 Segmental and somatic dysfunction of cervical region: Secondary | ICD-10-CM | POA: Diagnosis not present

## 2021-08-11 DIAGNOSIS — M9903 Segmental and somatic dysfunction of lumbar region: Secondary | ICD-10-CM | POA: Diagnosis not present

## 2021-08-16 DIAGNOSIS — I2584 Coronary atherosclerosis due to calcified coronary lesion: Secondary | ICD-10-CM

## 2021-08-16 DIAGNOSIS — I2510 Atherosclerotic heart disease of native coronary artery without angina pectoris: Secondary | ICD-10-CM

## 2021-08-16 DIAGNOSIS — I10 Essential (primary) hypertension: Secondary | ICD-10-CM

## 2021-08-16 DIAGNOSIS — E785 Hyperlipidemia, unspecified: Secondary | ICD-10-CM

## 2021-08-16 DIAGNOSIS — F0153 Vascular dementia, unspecified severity, with mood disturbance: Secondary | ICD-10-CM

## 2021-08-17 ENCOUNTER — Ambulatory Visit (INDEPENDENT_AMBULATORY_CARE_PROVIDER_SITE_OTHER): Payer: PPO

## 2021-08-17 ENCOUNTER — Other Ambulatory Visit: Payer: Self-pay

## 2021-08-17 VITALS — BP 120/72 | HR 72 | Temp 96.5°F | Ht 66.0 in | Wt 321.8 lb

## 2021-08-17 DIAGNOSIS — Z0000 Encounter for general adult medical examination without abnormal findings: Secondary | ICD-10-CM

## 2021-08-17 NOTE — Progress Notes (Signed)
Subjective:   Paul Cook is a 70 y.o. male who presents for Medicare Annual/Subsequent preventive examination.  Review of Systems    No ROS.  Medicare Wellness  Cardiac Risk Factors include: advanced age (>15men, >58 women);male gender;hypertension;sedentary lifestyle     Objective:    Today's Vitals   08/17/21 1319  BP: 120/72  Pulse: 72  Temp: (!) 96.5 F (35.8 C)  SpO2: 96%  Weight: (!) 321 lb 12.8 oz (146 kg)  Height: 5\' 6"  (1.676 m)   Body mass index is 51.94 kg/m.  Advanced Directives 08/17/2021 07/09/2021 07/08/2021 07/03/2020 04/23/2020 04/23/2019 04/04/2018  Does Patient Have a Medical Advance Directive? Yes Yes Yes No Yes Yes No  Type of Advance Directive Living will Living will [place];Living will - Healthcare Power of [place] -  Does patient want to make changes to medical advance directive? - No - Guardian declined No - Guardian declined - No - Patient declined No - Patient declined -  Copy of [place] in Chart? No - copy requested - No - copy requested - No - copy requested No - copy requested -  Would patient like information on creating a medical advance directive? - - - Yes (MAU/Ambulatory/Procedural Areas - Information given) - - Yes (MAU/Ambulatory/Procedural Areas - Information given)    Current Medications (verified) Outpatient Encounter Medications as of 08/17/2021  Medication Sig   amLODipine (NORVASC) 5 MG tablet Take 1 tablet (5 mg total) by mouth daily.   aspirin EC 81 MG tablet Take 1 tablet (81 mg total) by mouth daily. Swallow whole.   ezetimibe (ZETIA) 10 MG tablet Take 1 tablet (10 mg total) by mouth daily.   gabapentin (NEURONTIN) 600 MG tablet TAKE 1 TABLET(600 MG) BY MOUTH THREE TIMES DAILY   lisinopril-hydrochlorothiazide (ZESTORETIC) 20-12.5 MG tablet Take 2 tablets by mouth daily.   metoprolol succinate (TOPROL-XL) 25 MG 24 hr tablet TAKE 1 TABLET(25 MG) BY MOUTH  DAILY   rOPINIRole (REQUIP) 2 MG tablet TAKE 3 TABLETS(6 MG) BY MOUTH AT BEDTIME   No facility-administered encounter medications on file as of 08/17/2021.    Allergies (verified) Nsaids   History: Past Medical History:  Diagnosis Date   Adenomatous colon polyp    tubular   Adenomatous rectal polyp    tubular   Asthma    Depression    Diverticulosis    GERD (gastroesophageal reflux disease)    History of chicken pox    Hyperlipidemia    Hypertension    Lung nodule    CT 10/2014   OSA (obstructive sleep apnea)    Sleep apnea    wears cpap   Vitamin D deficiency    Past Surgical History:  Procedure Laterality Date   COLONOSCOPY     COLONOSCOPY W/ POLYPECTOMY  2015   [facility], benign   HERNIA REPAIR     inguinal   POLYPECTOMY     TONSILLECTOMY     age 10    Family History  Problem Relation Age of Onset   Depression Mother    Hypertension Father    Heart disease Father    Stroke Father    Cancer Maternal Grandmother        ovarian?   Cancer Paternal Grandfather        prostate   Prostate cancer Paternal Grandfather    Colon cancer Neg Hx    Colon polyps Neg Hx    Social History  Socioeconomic History   Marital status: Married    Spouse name: Laverne Klugh   Number of children: 1   Years of education: 16   Highest education level: Bachelor's degree (e.g., BA, AB, BS)  Occupational History   Not on file  Tobacco Use   Smoking status: Never    Passive exposure: Never   Smokeless tobacco: Never  Vaping Use   Vaping Use: Never used  Substance and Sexual Activity   Alcohol use: Yes    Alcohol/week: 4.0 standard drinks    Types: 4 Cans of beer per week    Comment: Alcoholic in the past   Drug use: No   Sexual activity: Not Currently  Other Topics Concern   Not on file  Social History Narrative   Lives in [place] with wife, [name] 23 years as of 10/2018. 1 daughter in [place].      Work - Optician, dispensing, now Animal nutritionist      From [place] Strain: Low Risk    Difficulty of Paying Living Expenses: Not hard at [employer] Insecurity: No Food Insecurity   Worried About Charity fundraiser in the Last Year: Never true   Arboriculturist in the Last Year: Never true  Transportation Needs: No Data processing manager (Medical): No   Lack of Transportation (Non-Medical): No  Physical Activity: Inactive   Days of Exercise per Week: 0 days   Minutes of Exercise per Session: 0 min  Stress: No Stress Concern Present   Feeling of Stress : Not at all  Social Connections: Moderately Integrated   Frequency of Communication with Friends and Family: More than three times a week   Frequency of Social Gatherings with Friends and Family: Never   Attends Religious Services: 1 to 4 times per year   Active Member of [employer] or Organizations: No   Attends Music therapist: Never   Marital Status: Married    Tobacco Counseling Counseling given: Not Answered   Clinical Intake:  Pre-visit preparation completed: Yes        Diabetes: No  How often do you need to have someone help you when you read instructions, pamphlets, or other written materials from your doctor or pharmacy?: 4 - Often  Interpreter Needed?: No      Activities of Daily Living In your present state of health, do you have any difficulty performing the following activities: 08/17/2021 07/08/2021  Hearing? N N  Vision? N N  Difficulty concentrating or making decisions? Tempie Donning  Comment Vascular Dementia Vascular Dementia  Walking or climbing stairs? Tempie Donning  Comment Paces self when climbing stairs and walking Non-Ambulatory  Dressing or bathing? N Y  Comment - Wife and daughter assist  Doing errands, shopping? N Y  Comment Notes he drives and runs errands on his own. In office by himself today. Wife and daughter [place] and eating ? Y Y  Comment Wife assist Wife  assists  Using the Toilet? N Y  Comment - Wife assists  In the past six months, have you accidently leaked urine? N Y  Comment Notes urinary frequency when emotionally stimulated. No leaking. Does not wear brief. Denies follow up at this time. Occasional Urinary Incontinence  Do you have problems with loss of bowel control? N N  Managing your Medications? Tempie Donning  Comment Wife assist  Wife assists  Managing your Finances? Y Y  Comment Wife assist Wife assists  Housekeeping or managing your Housekeeping? Y Y  Comment Wife assist Wife and daughter assist  Some recent data might be hidden    Patient Care Team: McLean-Scocuzza, [name], MD as PCP - General (Internal Medicine) End, Harrell Gave, MD as PCP - Cardiology (Cardiology) De Hollingshead, RPH-CPP (Pharmacist) Leona Singleton, RN as Case Manager  Indicate any recent Medical Services you may have received from other than [facility] providers in the past year (date may be approximate).     Assessment:   This is a routine wellness examination for Paul Cook.  Hearing/Vision screen Hearing Screening - Comments:: Patient is able to hear conversational tones without difficulty.  No issues reported. Vision Screening - Comments:: Followed by [facility] Wears corrective lenses They have seen their ophthalmologist in the last 12 months.   Dietary issues and exercise activities discussed: Current Exercise Habits: The patient does not participate in regular exercise at present, Exercise limited by: neurologic condition(s);orthopedic condition(s)   Goals Addressed               This Visit's Progress     Patient Stated     I would like to lose weight (pt-stated)        Healthy diet Stay active Stay hydrated       Depression Screen PHQ 2/9 Scores 08/17/2021 07/09/2021 07/08/2021 07/02/2021 05/15/2020 04/23/2020 01/21/2020  PHQ - 2 Score 0 - 0 0 0 0 0  PHQ- 9 Score - - - - 0 - -  Exception Documentation - Other- indicate reason in comment  box Other- indicate reason in comment box - - - -  Not completed - did not speak with patient Verified by Wife - [place] - - - -    Fall Risk Fall Risk  08/17/2021 07/09/2021 07/08/2021 07/02/2021 07/01/2020  Falls in the past year? 0 0 0 0 0  Number falls in past yr: - 0 0 - -  Injury with Fall? - 0 0 - -  Risk for fall due to : Mental status change Impaired vision;Mental status change Other (Comment);Impaired balance/gait;Impaired mobility;Mental status change - -  Risk for fall due to: Comment - - Non-Ambulatory - -  Follow up Falls evaluation completed Falls prevention discussed;Education provided;Falls evaluation completed Falls evaluation completed;Education provided;Falls prevention discussed Falls evaluation completed -    FALL RISK PREVENTION PERTAINING TO THE HOME: Home free of loose throw rugs in walkways, pet beds, electrical cords, etc? Yes  Adequate lighting in your home to reduce risk of falls? Yes   ASSISTIVE DEVICES UTILIZED TO PREVENT FALLS: Life alert? No  Use of a cane, walker or w/c? No  Grab bars in the bathroom? No  Shower chair or bench in shower? No  Elevated toilet seat or a handicapped toilet? No   TIMED UP AND GO: Was the test performed? Yes .  Length of time to ambulate 10 feet: 12 sec.   Gait steady and fast without use of assistive device  Cognitive Function: MMSE - Mini Mental State Exam 04/04/2018  Orientation to time 5  Orientation to Place 5  Registration 3  Attention/ Calculation 5  Recall 3  Language- name 2 objects 2  Language- repeat 1  Language- follow 3 step command 3  Language- read & follow direction 1  Write a sentence 1  Copy design 1  Total score 30     6CIT Screen 08/17/2021 04/23/2020 04/23/2019  What Year? 0 points 0 points 0 points  What month? 0 points 0 points 0 points  What time? 0 points - 0 points  Count back from 20 0 points 0 points 0 points  Months in reverse 0 points 0 points 0 points  Repeat phrase 0 points 0  points 0 points  Total Score 0 - 0    Immunizations Immunization History  Administered Date(s) Administered   Fluad Quad(high Dose 65+) 07/22/2019, 07/01/2020, 07/02/2021   Influenza Whole 06/19/2013   Influenza, High Dose Seasonal PF 06/28/2018   Influenza,inj,Quad PF,6+ Mos 07/15/2014, 07/18/2016   Influenza-Unspecified 06/18/2015   PFIZER(Purple Top)SARS-COV-2 Vaccination 12/03/2019, 12/24/2019   Pneumococcal Conjugate-13 03/30/2017   Pneumococcal Polysaccharide-23 04/04/2018   Tdap 04/17/2014   Zoster, Live 12/18/2013   Covid vaccine- notes 4 vaccines completed. Agrees to update immunization record next appointment.   Qualifies for Shingles Vaccine? Yes   Zostavax completed Yes   Shingrix Completed?: No.    Education has been provided regarding the importance of this vaccine. Patient has been advised to call insurance company to determine out of pocket expense if they have not yet received this vaccine. Advised may also receive vaccine at local pharmacy or Health Dept. Verbalized acceptance and understanding.  Screening Tests Health Maintenance  Topic Date Due   COVID-19 Vaccine (3 - Booster for Pfizer series) 09/02/2021 (Originally 02/18/2020)   Zoster Vaccines- Shingrix (1 of 2) 11/17/2021 (Originally 09/05/2001)   COLONOSCOPY (Pts 45-34yrs Insurance coverage will need to be confirmed)  08/17/2022 (Originally 04/12/2019)   TETANUS/TDAP  04/17/2024   Pneumonia Vaccine 50+ Years old  Completed   INFLUENZA VACCINE  Completed   HPV VACCINES  Aged Out   Health Maintenance There are no preventive care reminders to display for this patient.  Colonoscopy/Cologuard- deferred per patient.   Lung Cancer Screening: (Low Dose CT Chest recommended if Age 72-80 years, 30 pack-year currently smoking OR have quit w/in 15years.) does not qualify.   Hepatitis C Screening: deferred per patient.    Vision Screening: Recommended annual ophthalmology exams for early detection of glaucoma and  other disorders of the eye.  Dental Screening: Recommended annual dental exams for proper oral hygiene.   Community Resource Referral / Chronic Care Management: CRR required this visit?  No   CCM required this visit?  No      Plan:   Keep all routine maintenance appointments.   I have personally reviewed and noted the following in the patient's chart:   Medical and social history Use of alcohol, tobacco or illicit drugs  Current medications and supplements including opioid prescriptions. Patient is not currently taking opioid prescriptions. Functional ability and status Nutritional status Physical activity Advanced directives List of other physicians Hospitalizations, surgeries, and ER visits in previous 12 months Vitals Screenings to include cognitive, depression, and falls Referrals and appointments  In addition, I have reviewed and discussed with patient certain preventive protocols, quality metrics, and best practice recommendations. A written personalized care plan for preventive services as well as general preventive health recommendations were provided to patient.     Varney Biles, LPN   14/01/8184

## 2021-08-17 NOTE — Patient Instructions (Addendum)
Paul Cook , Thank you for taking time to come for your Medicare Wellness Visit. I appreciate your ongoing commitment to your health goals. Please review the following plan we discussed and let me know if I can assist you in the future.   These are the goals we discussed:  Goals       Patient Stated     (PharmD) Manage Medications (pt-stated)      Patient Goals/Self-Care Activities Over the next 90 days, patient will:  - take medications as prescribed focus on medication adherence by collaborating with retail pharmacy on adherence packaging       Blood Pressure < 140/90 (pt-stated)      Buy a new blood pressure machine with reading at 130/80      I would like to lose weight (pt-stated)      Healthy diet Stay active Stay hydrated      Other     (RNCM)  Keeping Myself/Loved One Safe-Dementia      Timeframe:  Long-Range Goal Priority:  Medium Start Date:  07/09/21                           Expected End Date:  02/13/22                    Barriers: Knowledge Psychological Impairment Psychosocial  Follow Up Date 08/25/2021    Keep medicine where it is safe or locked Use a GPS device in the car Use appliances that have an auto shut-off feature  Keep patient active during the day; do more crossword puzzle Limit naps throughout the day Do one enjoyable thing every day; laugh; watch a funny movie or comedian Eat healthy; do not eat or exercise right before bedtime Get outdoors every day (weather permitting) Practice relaxation or meditation daily Spend time or talk with others at least 2 to 3 times per week   Why is this important?   Safety is important when you or your loved one has dementia.  Eyesight, hearing and changes in feelings of hot and cold or depth-perception may occur.  Wandering or getting lost may happen.  You/your loved one may have to stop driving.  Taking steps to improve safety can prevent injuries.  It will also help you/your loved one feel more relaxed.   It will help you/your loved one be independent for a longer time.     Notes:       (RNCM)  Track and Manage My Blood Pressure-Hypertension      Timeframe:  Long-Range Goal Priority:  Medium Start Date:    07/09/21                         Expected End Date: 02/13/22                     Barriers: Knowledge Psychological Impairment Psychosocial  Follow Up Date 08/25/21    Check blood pressure weekly Write blood pressure results in a log for provider review   Why is this important?   You won't feel high blood pressure, but it can still hurt your blood vessels.  High blood pressure can cause heart or kidney problems. It can also cause a stroke.  Making lifestyle changes like losing a little weight or eating less salt will help.  Checking your blood pressure at home and at different times of the day can help to control  blood pressure.  If the doctor prescribes medicine remember to take it the way the doctor ordered.  Call the office if you cannot afford the medicine or if there are questions about it.     Notes:         This is a list of the screening recommended for you and due dates:  Health Maintenance  Topic Date Due   COVID-19 Vaccine (3 - Booster for Pfizer series) 09/02/2021*   Zoster (Shingles) Vaccine (1 of 2) 11/17/2021*   Colon Cancer Screening  08/17/2022*   Tetanus Vaccine  04/17/2024   Pneumonia Vaccine  Completed   Flu Shot  Completed   HPV Vaccine  Aged Out  *Topic was postponed. The date shown is not the original due date.   Advanced directives: End of life planning; Advance aging; Advanced directives discussed.  Copy of current HCPOA/Living Will requested.    Conditions/risks identified: none new  Next appointment: Follow up in one year for your annual wellness visit.   Preventive Care 27 Years and Older, Male Preventive care refers to lifestyle choices and visits with your health care provider that can promote health and wellness. What does preventive  care include? A yearly physical exam. This is also called an annual well check. Dental exams once or twice a year. Routine eye exams. Ask your health care provider how often you should have your eyes checked. Personal lifestyle choices, including: Daily care of your teeth and gums. Regular physical activity. Eating a healthy diet. Avoiding tobacco and drug use. Limiting alcohol use. Practicing safe sex. Taking low doses of aspirin every day. Taking vitamin and mineral supplements as recommended by your health care provider. What happens during an annual well check? The services and screenings done by your health care provider during your annual well check will depend on your age, overall health, lifestyle risk factors, and family history of disease. Counseling  Your health care provider may ask you questions about your: Alcohol use. Tobacco use. Drug use. Emotional well-being. Home and relationship well-being. Sexual activity. Eating habits. History of falls. Memory and ability to understand (cognition). Work and work Statistician. Screening  You may have the following tests or measurements: Height, weight, and BMI. Blood pressure. Lipid and cholesterol levels. These may be checked every 5 years, or more frequently if you are over 27 years old. Skin check. Lung cancer screening. You may have this screening every year starting at age 61 if you have a 30-pack-year history of smoking and currently smoke or have quit within the past 15 years. Fecal occult blood test (FOBT) of the stool. You may have this test every year starting at age 47. Flexible sigmoidoscopy or colonoscopy. You may have a sigmoidoscopy every 5 years or a colonoscopy every 10 years starting at age 32. Prostate cancer screening. Recommendations will vary depending on your family history and other risks. Hepatitis C blood test. Hepatitis B blood test. Sexually transmitted disease (STD) testing. Diabetes screening.  This is done by checking your blood sugar (glucose) after you have not eaten for a while (fasting). You may have this done every 1-3 years. Abdominal aortic aneurysm (AAA) screening. You may need this if you are a current or former smoker. Osteoporosis. You may be screened starting at age 39 if you are at high risk. Talk with your health care provider about your test results, treatment options, and if necessary, the need for more tests. Vaccines  Your health care provider may recommend certain vaccines, such as:  Influenza vaccine. This is recommended every year. Tetanus, diphtheria, and acellular pertussis (Tdap, Td) vaccine. You may need a Td booster every 10 years. Zoster vaccine. You may need this after age 74. Pneumococcal 13-valent conjugate (PCV13) vaccine. One dose is recommended after age 55. Pneumococcal polysaccharide (PPSV23) vaccine. One dose is recommended after age 5. Talk to your health care provider about which screenings and vaccines you need and how often you need them. This information is not intended to replace advice given to you by your health care provider. Make sure you discuss any questions you have with your health care provider. Document Released: 10/30/2015 Document Revised: 06/22/2016 Document Reviewed: 08/04/2015 Elsevier Interactive Patient Education  2017 [place] Prevention in the Home Falls can cause injuries. They can happen to people of all ages. There are many things you can do to make your home safe and to help prevent falls. What can I do on the outside of my home? Regularly fix the edges of walkways and driveways and fix any cracks. Remove anything that might make you trip as you walk through a door, such as a raised step or threshold. Trim any bushes or trees on the path to your home. Use bright outdoor lighting. Clear any walking paths of anything that might make someone trip, such as rocks or tools. Regularly check to see if handrails  are loose or broken. Make sure that both sides of any steps have handrails. Any raised decks and porches should have guardrails on the edges. Have any leaves, snow, or ice cleared regularly. Use sand or salt on walking paths during winter. Clean up any spills in your garage right away. This includes oil or grease spills. What can I do in the bathroom? Use night lights. Install grab bars by the toilet and in the tub and shower. Do not use towel bars as grab bars. Use non-skid mats or decals in the tub or shower. If you need to sit down in the shower, use a plastic, non-slip stool. Keep the floor dry. Clean up any water that spills on the floor as soon as it happens. Remove soap buildup in the tub or shower regularly. Attach bath mats securely with double-sided non-slip rug tape. Do not have throw rugs and other things on the floor that can make you trip. What can I do in the bedroom? Use night lights. Make sure that you have a light by your bed that is easy to reach. Do not use any sheets or blankets that are too big for your bed. They should not hang down onto the floor. Have a firm chair that has side arms. You can use this for support while you get dressed. Do not have throw rugs and other things on the floor that can make you trip. What can I do in the kitchen? Clean up any spills right away. Avoid walking on wet floors. Keep items that you use a lot in easy-to-reach places. If you need to reach something above you, use a strong step stool that has a grab bar. Keep electrical cords out of the way. Do not use floor polish or wax that makes floors slippery. If you must use wax, use non-skid floor wax. Do not have throw rugs and other things on the floor that can make you trip. What can I do with my stairs? Do not leave any items on the stairs. Make sure that there are handrails on both sides of the stairs and use them. Fix  handrails that are broken or loose. Make sure that handrails  are as long as the stairways. Check any carpeting to make sure that it is firmly attached to the stairs. Fix any carpet that is loose or worn. Avoid having throw rugs at the top or bottom of the stairs. If you do have throw rugs, attach them to the floor with carpet tape. Make sure that you have a light switch at the top of the stairs and the bottom of the stairs. If you do not have them, ask someone to add them for you. What else can I do to help prevent falls? Wear shoes that: Do not have high heels. Have rubber bottoms. Are comfortable and fit you well. Are closed at the toe. Do not wear sandals. If you use a stepladder: Make sure that it is fully opened. Do not climb a closed stepladder. Make sure that both sides of the stepladder are locked into place. Ask someone to hold it for you, if possible. Clearly mark and make sure that you can see: Any grab bars or handrails. First and last steps. Where the edge of each step is. Use tools that help you move around (mobility aids) if they are needed. These include: Canes. Walkers. Scooters. Crutches. Turn on the lights when you go into a dark area. Replace any light bulbs as soon as they burn out. Set up your furniture so you have a clear path. Avoid moving your furniture around. If any of your floors are uneven, fix them. If there are any pets around you, be aware of where they are. Review your medicines with your doctor. Some medicines can make you feel dizzy. This can increase your chance of falling. Ask your doctor what other things that you can do to help prevent falls. This information is not intended to replace advice given to you by your health care provider. Make sure you discuss any questions you have with your health care provider. Document Released: 07/30/2009 Document Revised: 03/10/2016 Document Reviewed: 11/07/2014 Elsevier Interactive Patient Education  2017 [employer].

## 2021-08-19 ENCOUNTER — Ambulatory Visit: Payer: PPO | Admitting: Podiatry

## 2021-08-19 ENCOUNTER — Other Ambulatory Visit: Payer: Self-pay

## 2021-08-19 DIAGNOSIS — M76821 Posterior tibial tendinitis, right leg: Secondary | ICD-10-CM | POA: Diagnosis not present

## 2021-08-19 DIAGNOSIS — Q666 Other congenital valgus deformities of feet: Secondary | ICD-10-CM | POA: Diagnosis not present

## 2021-08-20 NOTE — Progress Notes (Signed)
Subjective:  Patient ID: Paul Cook, male    DOB: 03-11-1951,  MRN: [id number]  Chief Complaint  Patient presents with   Foot Pain    PT stated that he is doing better     70 y.o. male presents with the above complaint.  Patient presents with follow-up of generalized foot pain.  Patient states doing much better in cam boot immobilization.  He now has more localized to posterior tibial tendon.  He would like to discuss treatment options for it.  He denies any other acute complaints.   Review of Systems: Negative except as noted in the HPI. Denies N/V/F/Ch.  Past Medical History:  Diagnosis Date   Adenomatous colon polyp    tubular   Adenomatous rectal polyp    tubular   Asthma    Depression    Diverticulosis    GERD (gastroesophageal reflux disease)    History of chicken pox    Hyperlipidemia    Hypertension    Lung nodule    CT 10/2014   OSA (obstructive sleep apnea)    Sleep apnea    wears cpap   Vitamin D deficiency     Current Outpatient Medications:    amLODipine (NORVASC) 5 MG tablet, Take 1 tablet (5 mg total) by mouth daily., Disp: 90 tablet, Rfl: 1   aspirin EC 81 MG tablet, Take 1 tablet (81 mg total) by mouth daily. Swallow whole., Disp: 90 tablet, Rfl: 1   ezetimibe (ZETIA) 10 MG tablet, Take 1 tablet (10 mg total) by mouth daily., Disp: 90 tablet, Rfl: 1   gabapentin (NEURONTIN) 600 MG tablet, TAKE 1 TABLET(600 MG) BY MOUTH THREE TIMES DAILY, Disp: 270 tablet, Rfl: 0   lisinopril-hydrochlorothiazide (ZESTORETIC) 20-12.5 MG tablet, Take 2 tablets by mouth daily., Disp: 180 tablet, Rfl: 1   metoprolol succinate (TOPROL-XL) 25 MG 24 hr tablet, TAKE 1 TABLET(25 MG) BY MOUTH DAILY, Disp: 90 tablet, Rfl: 1   rOPINIRole (REQUIP) 2 MG tablet, TAKE 3 TABLETS(6 MG) BY MOUTH AT BEDTIME, Disp: 270 tablet, Rfl: 1  Social History   Tobacco Use  Smoking Status Never   Passive exposure: Never  Smokeless Tobacco Never    Allergies  Allergen Reactions   Nsaids Other  (See Comments)    GI Issues   Objective:  There were no vitals filed for this visit. There is no height or weight on file to calculate BMI. Constitutional Well developed. Well nourished.  Vascular Dorsalis pedis pulses palpable bilaterally. Posterior tibial pulses palpable bilaterally. Capillary refill normal to all digits.  No cyanosis or clubbing noted. Pedal hair growth normal.  Neurologic Normal speech. Oriented to person, place, and time. Epicritic sensation to light touch grossly present bilaterally.  Dermatologic No further pain along the course of the posterior tibial tendon.  No further he states doing much better.  He now does not have any further pain.  The injection helped considerably.  He is transition to regular shoes without much pain.  He is here to pick up his orthotics as well.  Pain with resisted plantarflexion inversion of the foot.  No pain with dorsiflexion eversion of the foot.  No pain at the Achilles tendon ATFL or peroneal tendon.  Gait examination shows pes planovalgus foot structure with calcaneovalgus to many toe signs partially but recruit the arch with dorsiflexion of the hallux.  Unable to perform single and double heel raise.  Orthopedic: Normal joint ROM without pain or crepitus bilaterally. No visible deformities. No bony tenderness.  Radiographs: None Assessment:   1. Posterior tibial tendinitis, right   2. Pes planovalgus      Plan:  Patient was evaluated and treated and all questions answered.  Right posterior tibial tendinitis -Clinically resolved and is able to ambulate without any restriction.  He has transition to regular shoes.  I discussed shoe gear modification and orthotics management.  Orthotics were dispensed today.  If any foot and ankle issues arise in future of asked him to come see me.  He states understanding  Pes planovalgus -I explained to the patient the etiology of pes planovalgus and various treatment options were  extensively discussed.  Given the amount of pain that he is having I believe benefit from custom-made orthotics to control after motion support the arch of the foot take the stress away from the posterior tibial tendon.  Patient agrees with the plan. -Orthotics are functioning well and correcting the foot type.   No follow-ups on file.

## 2021-08-25 ENCOUNTER — Ambulatory Visit (INDEPENDENT_AMBULATORY_CARE_PROVIDER_SITE_OTHER): Payer: PPO | Admitting: *Deleted

## 2021-08-25 DIAGNOSIS — F0153 Vascular dementia, unspecified severity, with mood disturbance: Secondary | ICD-10-CM

## 2021-08-25 DIAGNOSIS — I10 Essential (primary) hypertension: Secondary | ICD-10-CM

## 2021-08-25 NOTE — Patient Instructions (Signed)
Visit Information  Patient will attend all scheduled provider appointments as evidenced by clinician review of documented attendance to scheduled appointments and patient/caregiver report Patient will continue to perform ADL's independently as evidenced by patient/caregiver report Patient will call provider office for new concerns or questions as evidenced by review of documented incoming telephone call notes and patient report Check blood pressure weekly Write blood pressure results in a log for provider review Keep medicine where it is safe or locked Do not drive; do not drink and drive Use appliances that have an auto shut-off feature  Keep patient active during the day; do more crossword puzzle Limit naps throughout the day Do one enjoyable thing every day; laugh; watch a funny movie or comedian Eat healthy; do not eat or exercise right before bedtime Get outdoors every day (weather permitting) Practice relaxation or meditation daily Spend time or talk with others at least 2 to 3 times per week  The patient verbalized understanding of instructions, educational materials, and care plan provided today and declined offer to receive copy of patient instructions, educational materials, and care plan.   The care management team will reach out to the patient again over the next 45 days.   Hubert Azure RN, MSN RN Care Management Coordinator [place] [phone] [email or web address]

## 2021-08-25 NOTE — Chronic Care Management (AMB) (Signed)
Chronic Care Management   CCM RN Visit Note  08/25/2021 Name: Paul Cook MRN: [id number] DOB: 1951/03/20  Subjective: Paul Cook is a 70 y.o. year old male who is a primary care patient of McLean-Scocuzza, [name], MD. The care management team was consulted for assistance with disease management and care coordination needs.    Engaged with patient by telephone for follow up visit in response to provider referral for case management and/or care coordination services.   Consent to Services:  The patient was given information about Chronic Care Management services, agreed to services, and gave verbal consent prior to initiation of services.  Please see initial visit note for detailed documentation.   Patient agreed to services and verbal consent obtained.   Assessment: Review of patient past medical history, allergies, medications, health status, including review of consultants reports, laboratory and other test data, was performed as part of comprehensive evaluation and provision of chronic care management services.   SDOH (Social Determinants of Health) assessments and interventions performed:    CCM Care Plan  Allergies  Allergen Reactions   Nsaids Other (See Comments)    GI Issues    Outpatient Encounter Medications as of 08/25/2021  Medication Sig Note   amLODipine (NORVASC) 5 MG tablet Take 1 tablet (5 mg total) by mouth daily.    aspirin EC 81 MG tablet Take 1 tablet (81 mg total) by mouth daily. Swallow whole.    ezetimibe (ZETIA) 10 MG tablet Take 1 tablet (10 mg total) by mouth daily.    gabapentin (NEURONTIN) 600 MG tablet TAKE 1 TABLET(600 MG) BY MOUTH THREE TIMES DAILY 07/20/2021: 300 mg QPM   lisinopril-hydrochlorothiazide (ZESTORETIC) 20-12.5 MG tablet Take 2 tablets by mouth daily.    metoprolol succinate (TOPROL-XL) 25 MG 24 hr tablet TAKE 1 TABLET(25 MG) BY MOUTH DAILY    rOPINIRole (REQUIP) 2 MG tablet TAKE 3 TABLETS(6 MG) BY MOUTH AT BEDTIME    No  facility-administered encounter medications on file as of 08/25/2021.    Patient Active Problem List   Diagnosis Date Noted   Urine incontinence 07/28/2021   OCD (obsessive compulsive disorder) 07/04/2021   Elevated liver enzymes 07/04/2021   Vascular dementia with depressed mood 07/02/2021   DOE (dyspnea on exertion) 08/06/2020   Abnormal EKG 08/06/2020   Coronary artery calcification seen on CT scan 07/13/2020   Coronary atherosclerosis due to calcified coronary lesion 06/10/2020   Aortic atherosclerosis ([place]) 06/10/2020   Lung nodule seen on imaging study 06/10/2020   Cyst of left kidney 05/29/2020   Gallstone 05/29/2020   Fatty liver 05/29/2020   Bilateral leg edema 05/15/2020   Venous stasis dermatitis of right lower extremity 05/15/2020   Right ankle swelling 12/17/2019   HLD (hyperlipidemia) 11/09/2018   History of alcohol abuse 09/06/2018   Chronic pain of right ankle 09/06/2018   Vitamin D deficiency 06/29/2018   Prediabetes 06/28/2018   Plantar fasciitis 08/29/2016   GERD (gastroesophageal reflux disease) 12/18/2015   Restless legs 03/19/2015   Essential hypertension 01/14/2014   Obstructive sleep apnea 12/17/2013   IBS (irritable bowel syndrome) 07/30/2013   Insomnia 06/12/2013   Cervical disc disorder with radiculopathy of cervical region 06/12/2013   Morbid obesity with BMI of 50.0-59.9, adult ([place]) 06/12/2013   Seasonal allergies 06/12/2013   Anxiety and depression 06/12/2013    Conditions to be addressed/monitored:HTN and Dementia  Care Plan : [place]  Updates made by Leona Singleton, RN since 08/25/2021 12:00 AM  Problem: Increasing forgetfulness related to dementia causing dificulties with self cae management of hypertension   Priority: Medium     Long-Range Goal: Patient and wife will work with CCM team to assist in learning more about dementia to help self care management of cormobidities   Start Date: 07/09/2021  Expected End Date:  02/13/2022  This Visit's Progress: Not on track  Recent Progress: Not on track  Priority: Medium  Note:   Current Barriers:  Knowledge Deficits related to plan of care for management of HTN and Dementia  Care Coordination needs related to Cognitive Deficits, Memory Deficits, and assistance arranging GI appointment  Cognitive Deficits;   History of hypertension and not monitoring blood pressures at home.  Wife has noticed increase in confusion for patient.  Lives at home with wife, currently at home alone, whole wife recovers from hip surgery at daughters house.  Per wife, patient continues to drive; does not feel well today, has cold and bad cough.  Wife continues to manage meds through pill box, although he missed multiple days a few weeks ago while she was not home, but states it has gotten better.  Has GI consultation appointment she is having trouble arranging.  Spoke with wife, not patient.  Wife continues to be home with patient.  States patient is about the same, maybe a little better now that she is home to make sure he is getting his medications.  States she is worried about his drinking, especially drinking and driving.  Still awaiting GI evaluation.  Has seen neurology and awaiting referrals for sleep study and occupational health referral.  Reports bllod pressures at home have been good, last one 127/80.  RNCM Clinical Goal(s):  Patient will verbalize understanding of plan for management of HTN and Dementia  take all medications exactly as prescribed and will call provider for medication related questions continue to work with RN Care Manager to address care management and care coordination needs related to HTN and Dementia  work with Education officer, museum to address [place] Concerns , Cognitive Deficits, and Memory Deficits related to the management of Dementia through collaboration with RN Care manager, provider, and care team.  Wife will report monitoring blood pressure at home weekly  within the next 45 days.  Interventions: 1:1 collaboration with primary care provider regarding development and update of comprehensive plan of care as evidenced by provider attestation and co-signature Inter-disciplinary care team collaboration (see longitudinal plan of care) Evaluation of current treatment plan related to  self management and patient's adherence to plan as established by provider Attend appointment with GI 08/31/21 Encouraged no alcohol  Dementia  (Status: Goal on track: NO.) Evaluation of current treatment plan related to Dementia, Limited education about Dementia*, Cognitive Deficits, and Memory Deficits self-management and patient's adherence to plan as established by provider. Provided education to patient re: Dementia; Discussed plans with patient for ongoing care management follow up and provided patient with direct contact information for care management team; Advised patient to discuss diagnosis and medications with provider; Assessed social determinant of health barriers;  Follow up with Neurologist to discuss OT referral and sleep study Discussed with wife patient should not be drinking and driving; encouraged wife to discuss driving with patient and Neurologist Discussed dangers of alcohol with dementia and driving and encouraged wife to discuss with providers  Hypertension: (Status: Goal on Track (progressing): YES.) Last practice recorded BP readings:  BP Readings from Last 3 Encounters:  08/17/21 120/72  07/27/21 118/70  07/02/21 140/88  Most recent eGFR/CrCl: No results found for: EGFR  No components found for: CRCL  Evaluation of current treatment plan related to hypertension self management and patient's adherence to plan as established by provider;   Reviewed medications with patient and discussed importance of compliance;  Advised patient, providing education and rationale, to monitor blood pressure daily and record, calling PCP for findings outside  established parameters;  Discussed complications of poorly controlled blood pressure such as heart disease, stroke, circulatory complications, vision complications, kidney impairment, sexual dysfunction;  Encouraged to continue to take and record blood pressures at least weekly for provider review   Patient Goals/Self-Care Activities: Patient will attend all scheduled provider appointments as evidenced by clinician review of documented attendance to scheduled appointments and patient/caregiver report Patient will continue to perform ADL's independently as evidenced by patient/caregiver report Patient will call provider office for new concerns or questions as evidenced by review of documented incoming telephone call notes and patient report Check blood pressure weekly Write blood pressure results in a log for provider review Keep medicine where it is safe or locked Do not drive; do not drink and drive Use appliances that have an auto shut-off feature  Keep patient active during the day; do more crossword puzzle Limit naps throughout the day Do one enjoyable thing every day; laugh; watch a funny movie or comedian Eat healthy; do not eat or exercise right before bedtime Get outdoors every day (weather permitting) Practice relaxation or meditation daily Spend time or talk with others at least 2 to 3 times per week       Plan:The care management team will reach out to the patient again over the next 45 days.  Hubert Azure RN, MSN RN Care Management Coordinator [place] [phone] [email or web address]

## 2021-08-30 NOTE — Addendum Note (Signed)
Addended by: Orland Mustard on: 08/30/2021 03:02 PM   Modules accepted: Orders

## 2021-08-31 ENCOUNTER — Encounter: Payer: Self-pay | Admitting: Gastroenterology

## 2021-08-31 ENCOUNTER — Ambulatory Visit (INDEPENDENT_AMBULATORY_CARE_PROVIDER_SITE_OTHER): Payer: PPO | Admitting: Gastroenterology

## 2021-08-31 VITALS — BP 125/79 | HR 92 | Temp 98.1°F | Ht 66.0 in | Wt 323.2 lb

## 2021-08-31 DIAGNOSIS — R7989 Other specified abnormal findings of blood chemistry: Secondary | ICD-10-CM

## 2021-08-31 DIAGNOSIS — F1010 Alcohol abuse, uncomplicated: Secondary | ICD-10-CM

## 2021-08-31 DIAGNOSIS — K760 Fatty (change of) liver, not elsewhere classified: Secondary | ICD-10-CM

## 2021-08-31 DIAGNOSIS — Z86010 Personal history of colonic polyps: Secondary | ICD-10-CM

## 2021-08-31 MED ORDER — PEG 3350-KCL-NA BICARB-NACL 420 G PO SOLR: 4000.0000 mL | Freq: Once | ORAL | 0 refills | Status: AC

## 2021-08-31 NOTE — Patient Instructions (Signed)
Heart-Healthy Eating Plan Many factors influence your heart (coronary) health, including eating and exercise habits. Coronary risk increases with abnormal blood fat (lipid) levels. Heart-healthy meal planning includes limiting unhealthy fats, increasing healthy fats, and making other diet and lifestyle changes. What is my plan? Your health care provider may recommend that you: Limit your fat intake to _________% or less of your total calories each day. Limit your saturated fat intake to _________% or less of your total calories each day. Limit the amount of cholesterol in your diet to less than _________ mg per day. What are tips for following this plan? Cooking Cook foods using methods other than frying. Baking, boiling, grilling, and broiling are all good options. Other ways to reduce fat include: Removing the skin from poultry. Removing all visible fats from meats. Steaming vegetables in water or broth. Meal planning  At meals, imagine dividing your plate into fourths: Fill one-half of your plate with vegetables and green salads. Fill one-fourth of your plate with whole grains. Fill one-fourth of your plate with lean protein foods. Eat 4-5 servings of vegetables per day. One serving equals 1 cup raw or cooked vegetable, or 2 cups raw leafy greens. Eat 4-5 servings of fruit per day. One serving equals 1 medium whole fruit,  cup dried fruit,  cup fresh, frozen, or canned fruit, or  cup 100% fruit juice. Eat more foods that contain soluble fiber. Examples include apples, broccoli, carrots, beans, peas, and barley. Aim to get 25-30 g of fiber per day. Increase your consumption of legumes, nuts, and seeds to 4-5 servings per week. One serving of dried beans or legumes equals  cup cooked, 1 serving of nuts is  cup, and 1 serving of seeds equals 1 tablespoon. Fats Choose healthy fats more often. Choose monounsaturated and polyunsaturated fats, such as olive and canola oils, flaxseeds,  walnuts, almonds, and seeds. Eat more omega-3 fats. Choose salmon, mackerel, sardines, tuna, flaxseed oil, and ground flaxseeds. Aim to eat fish at least 2 times each week. Check food labels carefully to identify foods with trans fats or high amounts of saturated fat. Limit saturated fats. These are found in animal products, such as meats, butter, and cream. Plant sources of saturated fats include palm oil, palm kernel oil, and coconut oil. Avoid foods with partially hydrogenated oils in them. These contain trans fats. Examples are stick margarine, some tub margarines, cookies, crackers, and other baked goods. Avoid fried foods. General information Eat more home-cooked food and less restaurant, buffet, and fast food. Limit or avoid alcohol. Limit foods that are high in starch and sugar. Lose weight if you are overweight. Losing just 5-10% of your body weight can help your overall health and prevent diseases such as diabetes and heart disease. Monitor your salt (sodium) intake, especially if you have high blood pressure. Talk with your health care provider about your sodium intake. Try to incorporate more vegetarian meals weekly. What foods can I eat? Fruits All fresh, canned (in natural juice), or frozen fruits. Vegetables Fresh or frozen vegetables (raw, steamed, roasted, or grilled). Green salads. Grains Most grains. Choose whole wheat and whole grains most of the time. Rice and pasta, including brown rice and pastas made with whole wheat. Meats and other proteins Lean, well-trimmed beef, veal, pork, and lamb. Chicken and [place] without skin. All fish and shellfish. Wild duck, rabbit, pheasant, and venison. Egg whites or low-cholesterol egg substitutes. Dried beans, peas, lentils, and tofu. Seeds and most nuts. Dairy Low-fat or nonfat cheeses,  including ricotta and mozzarella. Skim or 1% milk (liquid, powdered, or evaporated). Buttermilk made with low-fat milk. Nonfat or low-fat  yogurt. Fats and oils Non-hydrogenated (trans-free) margarines. Vegetable oils, including soybean, sesame, sunflower, olive, peanut, safflower, corn, canola, and cottonseed. Salad dressings or mayonnaise made with a vegetable oil. Beverages Water (mineral or sparkling). Coffee and tea. Diet carbonated beverages. Sweets and desserts Sherbet, gelatin, and fruit ice. Small amounts of dark chocolate. Limit all sweets and desserts. Seasonings and condiments All seasonings and condiments. The items listed above may not be a complete list of foods and beverages you can eat. Contact a dietitian for more options. What foods are not recommended? Fruits Canned fruit in heavy syrup. Fruit in cream or butter sauce. Fried fruit. Limit coconut. Vegetables Vegetables cooked in cheese, cream, or butter sauce. Fried vegetables. Grains Breads made with saturated or trans fats, oils, or whole milk. Croissants. Sweet rolls. Donuts. High-fat crackers, such as cheese crackers. Meats and other proteins Fatty meats, such as hot dogs, ribs, sausage, bacon, rib-eye roast or steak. High-fat deli meats, such as salami and bologna. Caviar. Domestic duck and goose. Organ meats, such as liver. Dairy Cream, sour cream, cream cheese, and creamed cottage cheese. Whole-milk cheeses. Whole or 2% milk (liquid, evaporated, or condensed). Whole buttermilk. Cream sauce or high-fat cheese sauce. Whole-milk yogurt. Fats and oils Meat fat, or shortening. Cocoa butter, hydrogenated oils, palm oil, coconut oil, palm kernel oil. Solid fats and shortenings, including bacon fat, salt pork, lard, and butter. Nondairy cream substitutes. Salad dressings with cheese or sour cream. Beverages Regular sodas and any drinks with added sugar. Sweets and desserts Frosting. Pudding. Cookies. Cakes. Pies. Milk chocolate or white chocolate. Buttered syrups. Full-fat ice cream or ice cream drinks. The items listed above may not be a complete list of  foods and beverages to avoid. Contact a dietitian for more information. Summary Heart-healthy meal planning includes limiting unhealthy fats, increasing healthy fats, and making other diet and lifestyle changes. Lose weight if you are overweight. Losing just 5-10% of your body weight can help your overall health and prevent diseases such as diabetes and heart disease. Focus on eating a balance of foods, including fruits and vegetables, low-fat or nonfat dairy, lean protein, nuts and legumes, whole grains, and heart-healthy oils and fats. This information is not intended to replace advice given to you by your health care provider. Make sure you discuss any questions you have with your health care provider. Document Revised: 02/11/2021 Document Reviewed: 02/11/2021 Elsevier Patient Education  2022 [place]. Fatty Liver Disease The liver converts food into energy, removes toxic material from the blood, makes important proteins, and absorbs necessary vitamins from food. Fatty liver disease occurs when too much fat has built up in your liver cells. Fatty liver disease is also called hepatic steatosis. In many cases, fatty liver disease does not cause symptoms or problems. It is often diagnosed when tests are being done for other reasons. However, over time, fatty liver can cause inflammation that may lead to more serious liver problems, such as scarring of the liver (cirrhosis) and liver failure. Fatty liver is associated with insulin resistance, increased body fat, high blood pressure (hypertension), and high cholesterol. These are features of metabolic syndrome and increase your risk for stroke, diabetes, and heart disease. What are the causes? This condition may be caused by components of metabolic syndrome: Obesity. Insulin resistance. High cholesterol. Other causes: Alcohol abuse. Poor nutrition. Cushing syndrome. Pregnancy. Certain drugs. Poisons. Some viral infections.  What increases  the risk? You are more likely to develop this condition if you: Abuse alcohol. Are overweight. Have diabetes. Have hepatitis. Have a high triglyceride level. Are pregnant. What are the signs or symptoms? Fatty liver disease often does not cause symptoms. If symptoms do develop, they can include: Fatigue and weakness. Weight loss. Confusion. Nausea, vomiting, or abdominal pain. Yellowing of your skin and the white parts of your eyes (jaundice). Itchy skin. How is this diagnosed? This condition may be diagnosed by: A physical exam and your medical history. Blood tests. Imaging tests, such as an ultrasound, CT scan, or MRI. A liver biopsy. A small sample of liver tissue is removed using a needle. The sample is then looked at under a microscope. How is this treated? Fatty liver disease is often caused by other health conditions. Treatment for fatty liver may involve medicines and lifestyle changes to manage conditions such as: Alcoholism. High cholesterol. Diabetes. Being overweight or obese. Follow these instructions at home:  Do not drink alcohol. If you have trouble quitting, ask your health care provider how to safely quit with the help of medicine or a supervised program. This is important to keep your condition from getting worse. Eat a healthy diet as told by your health care provider. Ask your health care provider about working with a dietitian to develop an eating plan. Exercise regularly. This can help you lose weight and control your cholesterol and diabetes. Talk to your health care provider about an exercise plan and which activities are best for you. Take over-the-counter and prescription medicines only as told by your health care provider. Keep all follow-up visits. This is important. Contact a health care provider if: You have trouble controlling your: Blood sugar. This is especially important if you have diabetes. Cholesterol. Drinking of alcohol. Get help right  away if: You have abdominal pain. You have jaundice. You have nausea and are vomiting. You vomit blood or material that looks like coffee grounds. You have stools that are black, tar-like, or bloody. Summary Fatty liver disease develops when too much fat builds up in the cells of your liver. Fatty liver disease often causes no symptoms or problems. However, over time, fatty liver can cause inflammation that may lead to more serious liver problems, such as scarring of the liver (cirrhosis). You are more likely to develop this condition if you abuse alcohol, are pregnant, are overweight, have diabetes, have hepatitis, or have high triglyceride or cholesterol levels. Contact your health care provider if you have trouble controlling your blood sugar, cholesterol, or drinking of alcohol. This information is not intended to replace advice given to you by your health care provider. Make sure you discuss any questions you have with your health care provider. Document Revised: 07/16/2020 Document Reviewed: 07/16/2020 Elsevier Patient Education  [place]. Low-Sodium Eating Plan Sodium, which is an element that makes up salt, helps you maintain a healthy balance of fluids in your body. Too much sodium can increase your blood pressure and cause fluid and waste to be held in your body. Your health care provider or dietitian may recommend following this plan if you have high blood pressure (hypertension), kidney disease, liver disease, or heart failure. Eating less sodium can help lower your blood pressure, reduce swelling, and protect your heart, liver, and kidneys. What are tips for following this plan? Reading food labels The Nutrition Facts label lists the amount of sodium in one serving of the food. If you eat more than one  serving, you must multiply the listed amount of sodium by the number of servings. Choose foods with less than 140 mg of sodium per serving. Avoid foods with 300 mg of sodium  or more per serving. Shopping  Look for lower-sodium products, often labeled as "low-sodium" or "no salt added." Always check the sodium content, even if foods are labeled as "unsalted" or "no salt added." Buy fresh foods. Avoid canned foods and pre-made or frozen meals. Avoid canned, cured, or processed meats. Buy breads that have less than 80 mg of sodium per slice. Cooking  Eat more home-cooked food and less restaurant, buffet, and fast food. Avoid adding salt when cooking. Use salt-free seasonings or herbs instead of table salt or sea salt. Check with your health care provider or pharmacist before using salt substitutes. Cook with plant-based oils, such as canola, sunflower, or olive oil. Meal planning When eating at a restaurant, ask that your food be prepared with less salt or no salt, if possible. Avoid dishes labeled as brined, pickled, cured, smoked, or made with soy sauce, miso, or teriyaki sauce. Avoid foods that contain MSG (monosodium glutamate). MSG is sometimes added to [place] food, bouillon, and some canned foods. Make meals that can be grilled, baked, poached, roasted, or steamed. These are generally made with less sodium. General information Most people on this plan should limit their sodium intake to 1,500-2,000 mg (milligrams) of sodium each day. What foods should I eat? Fruits Fresh, frozen, or canned fruit. Fruit juice. Vegetables Fresh or frozen vegetables. "No salt added" canned vegetables. "No salt added" tomato sauce and paste. Low-sodium or reduced-sodium tomato and vegetable juice. Grains Low-sodium cereals, including oats, puffed wheat and rice, and shredded wheat. Low-sodium crackers. Unsalted rice. Unsalted pasta. Low-sodium bread. Whole-grain breads and whole-grain pasta. Meats and other proteins Fresh or frozen (no salt added) meat, poultry, seafood, and fish. Low-sodium canned tuna and salmon. Unsalted nuts. Dried peas, beans, and lentils without added  salt. Unsalted canned beans. Eggs. Unsalted nut butters. Dairy Milk. Soy milk. Cheese that is naturally low in sodium, such as ricotta cheese, fresh mozzarella, or Swiss cheese. Low-sodium or reduced-sodium cheese. Cream cheese. Yogurt. Seasonings and condiments Fresh and dried herbs and spices. Salt-free seasonings. Low-sodium mustard and ketchup. Sodium-free salad dressing. Sodium-free light mayonnaise. Fresh or refrigerated horseradish. Lemon juice. Vinegar. Other foods Homemade, reduced-sodium, or low-sodium soups. Unsalted popcorn and pretzels. Low-salt or salt-free chips. The items listed above may not be a complete list of foods and beverages you can eat. Contact a dietitian for more information. What foods should I avoid? Vegetables Sauerkraut, pickled vegetables, and relishes. Olives. [place] fries. Onion rings. Regular canned vegetables (not low-sodium or reduced-sodium). Regular canned tomato sauce and paste (not low-sodium or reduced-sodium). Regular tomato and vegetable juice (not low-sodium or reduced-sodium). Frozen vegetables in sauces. Grains Instant hot cereals. Bread stuffing, pancake, and biscuit mixes. Croutons. Seasoned rice or pasta mixes. Noodle soup cups. Boxed or frozen macaroni and cheese. Regular salted crackers. Self-rising flour. Meats and other proteins Meat or fish that is salted, canned, smoked, spiced, or pickled. Precooked or cured meat, such as sausages or meat loaves. Berniece Salines. Ham. Pepperoni. Hot dogs. Corned beef. Chipped beef. Salt pork. Jerky. Pickled herring. Anchovies and sardines. Regular canned tuna. Salted nuts. Dairy Processed cheese and cheese spreads. Hard cheeses. Cheese curds. Blue cheese. Feta cheese. String cheese. Regular cottage cheese. Buttermilk. Canned milk. Fats and oils Salted butter. Regular margarine. Ghee. Bacon fat. Seasonings and condiments Onion salt, garlic salt, seasoned  salt, table salt, and sea salt. Canned and packaged gravies.  Worcestershire sauce. Tartar sauce. Barbecue sauce. Teriyaki sauce. Soy sauce, including reduced-sodium. Steak sauce. Fish sauce. Oyster sauce. Cocktail sauce. Horseradish that you find on the shelf. Regular ketchup and mustard. Meat flavorings and tenderizers. Bouillon cubes. Hot sauce. Pre-made or packaged marinades. Pre-made or packaged taco seasonings. Relishes. Regular salad dressings. Salsa. Other foods Salted popcorn and pretzels. Corn chips and puffs. Potato and tortilla chips. Canned or dried soups. Pizza. Frozen entrees and pot pies. The items listed above may not be a complete list of foods and beverages you should avoid. Contact a dietitian for more information. Summary Eating less sodium can help lower your blood pressure, reduce swelling, and protect your heart, liver, and kidneys. Most people on this plan should limit their sodium intake to 1,500-2,000 mg (milligrams) of sodium each day. Canned, boxed, and frozen foods are high in sodium. Restaurant foods, fast foods, and pizza are also very high in sodium. You also get sodium by adding salt to food. Try to cook at home, eat more fresh fruits and vegetables, and eat less fast food and canned, processed, or prepared foods. This information is not intended to replace advice given to you by your health care provider. Make sure you discuss any questions you have with your health care provider. Document Revised: 11/08/2019 Document Reviewed: 09/04/2019 Elsevier Patient Education  2022 [employer].

## 2021-08-31 NOTE — Addendum Note (Signed)
Addended by: Eliseo Squires on: 08/31/2021 03:26 PM   Modules accepted: Orders, SmartSet

## 2021-08-31 NOTE — Progress Notes (Signed)
Paul Darby, MD [address]  [place]  [place], [place] [address]  Main: [phone]  Fax: [phone]    Gastroenterology Consultation  Referring Provider:     Crecencio Mc, MD Primary Care Physician:  McLean-Scocuzza, Paul Glow, MD Primary Gastroenterologist:  Dr. Cephas Cook Reason for Consultation:     Elevated LFTs, fatty liver        HPI:   Paul Cook is a 70 y.o. male referred by Dr. Terese Door, Paul Glow, MD  for consultation & management of elevated LFTs.  Patient is found to have abnormal liver enzymes, first detected in 4/21, AST 59, ALT 84, most recently AST 67, ALT 95, alkaline phosphatase and total bilirubin normal.  No evidence of thrombocytopenia.  Ultrasound revealed fatty liver.  Hep C antibody negative, TSH normal.  Hemoglobin A1c 6.1.  Patient denies any GI symptoms.  He does acknowledge drinking diet sodas, fruit juices, sweet tea daily.  He does eat hamburger twice a week.  Patient is accompanied by his wife today.  No evidence of anemia  Patient does drink alcohol, he used to drink alcohol 20 years ago, restarted about 2 years ago since he stopped working.  He drinks beer mostly, sometimes up to 50 beers in 2 days  NSAIDs: None  Antiplts/Anticoagulants/Anti thrombotics: None  GI Procedures:  Colonoscopy 04/11/2016 - Diverticulosis in the entire examined colon, worst in the left colon. - One 5 mm polyp in the cecum, removed with a cold snare. Resected and retrieved. - One 4 mm polyp in the ascending colon, removed with a cold biopsy forceps. Resected and retrieved. - One diminutive polyp in the transverse colon, removed with a cold biopsy forceps. Resected and retrieved. - One 5 mm polyp in the transverse colon, removed with a cold snare. Resected and retrieved. - A tattoo was seen in the sigmoid colon. The tattoo site appeared normal. - Non-bleeding internal hemorrhoids. - The examination was otherwise normal.  Diagnosis Surgical  [P], transverse, ascending and cecum, polyp (4) - TUBULAR ADENOMA (X1 FRAGMENT). - SESSILE SERRATED POLYP/ADENOMA (X2 FRAGMENTS). - BENIGN COLONIC MUCOSA (X1 FRAGMENT). - NO HIGH DYSPLASIA OR MALIGNANCY.  Past Medical History:  Diagnosis Date   Adenomatous colon polyp    tubular   Adenomatous rectal polyp    tubular   Asthma    Depression    Diverticulosis    GERD (gastroesophageal reflux disease)    History of chicken pox    Hyperlipidemia    Hypertension    Lung nodule    CT 10/2014   OSA (obstructive sleep apnea)    Sleep apnea    wears cpap   Vitamin D deficiency     Past Surgical History:  Procedure Laterality Date   COLONOSCOPY     COLONOSCOPY W/ POLYPECTOMY  2015   [facility], benign   HERNIA REPAIR     inguinal   POLYPECTOMY     TONSILLECTOMY     age 31     Current Outpatient Medications:    amLODipine (NORVASC) 5 MG tablet, Take 1 tablet (5 mg total) by mouth daily., Disp: 90 tablet, Rfl: 1   aspirin EC 81 MG tablet, Take 1 tablet (81 mg total) by mouth daily. Swallow whole., Disp: 90 tablet, Rfl: 1   ezetimibe (ZETIA) 10 MG tablet, Take 1 tablet (10 mg total) by mouth daily., Disp: 90 tablet, Rfl: 1   gabapentin (NEURONTIN) 600 MG tablet, TAKE 1 TABLET(600 MG) BY MOUTH THREE TIMES DAILY,  Disp: 270 tablet, Rfl: 0   lisinopril-hydrochlorothiazide (ZESTORETIC) 20-12.5 MG tablet, Take 2 tablets by mouth daily., Disp: 180 tablet, Rfl: 1   metoprolol succinate (TOPROL-XL) 25 MG 24 hr tablet, TAKE 1 TABLET(25 MG) BY MOUTH DAILY, Disp: 90 tablet, Rfl: 1   polyethylene glycol-electrolytes (NULYTELY) 420 g solution, Take 4,000 mLs by mouth once for 1 dose., Disp: 4000 mL, Rfl: 0   rOPINIRole (REQUIP) 2 MG tablet, TAKE 3 TABLETS(6 MG) BY MOUTH AT BEDTIME, Disp: 270 tablet, Rfl: 1   gabapentin (NEURONTIN) 300 MG capsule, Take 300 mg by mouth daily., Disp: , Rfl:     Family History  Problem Relation Age of Onset   Depression Mother    Hypertension Father    Heart disease  Father    Stroke Father    Cancer Maternal Grandmother        ovarian?   Cancer Paternal Grandfather        prostate   Prostate cancer Paternal Grandfather    Colon cancer Neg Hx    Colon polyps Neg Hx      Social History   Tobacco Use   Smoking status: Never    Passive exposure: Never   Smokeless tobacco: Never  Vaping Use   Vaping Use: Never used  Substance Use Topics   Alcohol use: Yes    Alcohol/week: 4.0 standard drinks    Types: 4 Cans of beer per week    Comment: Alcoholic in the past   Drug use: No    Allergies as of 08/31/2021 - Review Complete 08/31/2021  Allergen Reaction Noted   Nsaids Other (See Comments) 06/12/2013    Review of Systems:    All systems reviewed and negative except where noted in HPI.   Physical Exam:  BP 125/79 (BP Location: Right Arm, Patient Position: Sitting, Cuff Size: Large)   Pulse 92   Temp 98.1 F (36.7 C) (Oral)   Ht 5\' 6"  (1.676 m)   Wt (!) 323 lb 3.2 oz (146.6 kg)   BMI 52.17 kg/m  No LMP for male patient.  General:   Alert,  Well-developed, well-nourished, pleasant and cooperative in NAD Head:  Normocephalic and atraumatic. Eyes:  Sclera clear, no icterus.   Conjunctiva pink. Ears:  Normal auditory acuity. Nose:  No deformity, discharge, or lesions. Mouth:  No deformity or lesions,oropharynx pink & moist. Neck:  Supple; no masses or thyromegaly. Lungs:  Respirations even and unlabored.  Clear throughout to auscultation.   No wheezes, crackles, or rhonchi. No acute distress. Heart:  Regular rate and rhythm; no murmurs, clicks, rubs, or gallops. Abdomen:  Normal bowel sounds. Soft, non-tender and non-distended without masses, hepatosplenomegaly or hernias noted.  No guarding or rebound tenderness.   Rectal: Not performed Msk:  Symmetrical without gross deformities. Good, equal movement & strength bilaterally. Pulses:  Normal pulses noted. Extremities:  No clubbing, 2+ edema.  No cyanosis. Neurologic:  Alert and  oriented x3;  grossly normal neurologically. Skin:  Intact without significant lesions or rashes. No jaundice. Psych:  Alert and cooperative. Normal mood and affect.  Imaging Studies: Reviewed  Assessment and Plan:   Paul Cook is a 71 y.o. male with history of metabolic syndrome, morbid obesity BMI 52 is seen in consultation for elevated liver enzymes, fatty liver and personal history of tubular adenomas of the colon  Elevated liver enzymes: Most likely secondary to NASH from metabolic syndrome Complete secondary liver disease work-up, labs ordered Reiterated on healthy diet Discussed about low-sodium diet, low-fat  diet Discussed about tight control of diabetes Okay to start metformin 1000 mg daily  Tubular adenomas of the colon Discussed with surveillance colonoscopy and patient is agreeable  I have discussed alternative options, risks & benefits,  which include, but are not limited to, bleeding, infection, perforation,respiratory complication & drug reaction.  The patient agrees with this plan & written consent will be obtained.      Follow up in 4 months   Paul Darby, MD

## 2021-09-01 LAB — HEPATIC FUNCTION PANEL
ALT: 135 IU/L — ABNORMAL HIGH (ref 0–44)
AST: 106 IU/L — ABNORMAL HIGH (ref 0–40)
Albumin: 4.5 g/dL (ref 3.8–4.8)
Alkaline Phosphatase: 75 IU/L (ref 44–121)
Bilirubin Total: 0.7 mg/dL (ref 0.0–1.2)
Bilirubin, Direct: 0.22 mg/dL (ref 0.00–0.40)
Total Protein: 6.8 g/dL (ref 6.0–8.5)

## 2021-09-01 LAB — ANA: Anti Nuclear Antibody (ANA): POSITIVE — AB

## 2021-09-02 LAB — HEPATITIS B SURFACE ANTIGEN: Hepatitis B Surface Ag: NEGATIVE

## 2021-09-02 LAB — CERULOPLASMIN: Ceruloplasmin: 21.6 mg/dL (ref 16.0–31.0)

## 2021-09-02 LAB — HEPATITIS B SURFACE ANTIBODY,QUALITATIVE: Hep B Surface Ab, Qual: NONREACTIVE

## 2021-09-02 LAB — CELIAC DISEASE PANEL
Endomysial IgA: NEGATIVE
IgA/Immunoglobulin A, Serum: 240 mg/dL (ref 61–437)
Transglutaminase IgA: <2 U/mL (ref 0–3)

## 2021-09-02 LAB — IRON,TIBC AND FERRITIN PANEL
Ferritin: 817 ng/mL — ABNORMAL HIGH (ref 30–400)
Iron Saturation: 42 % (ref 15–55)
Iron: 129 ug/dL (ref 38–169)
Total Iron Binding Capacity: 306 ug/dL (ref 250–450)
UIBC: 177 ug/dL (ref 111–343)

## 2021-09-02 LAB — HEPATITIS B CORE ANTIBODY, TOTAL: Hep B Core Total Ab: NEGATIVE

## 2021-09-02 LAB — ALPHA-1-ANTITRYPSIN: A-1 Antitrypsin: 163 mg/dL (ref 101–187)

## 2021-09-02 LAB — ANTI-SMOOTH MUSCLE ANTIBODY, IGG: Smooth Muscle Ab: 6 Units (ref 0–19)

## 2021-09-02 LAB — ANTI-MICROSOMAL ANTIBODY LIVER / KIDNEY: LKM1 Ab: 0.6 Units (ref 0.0–20.0)

## 2021-09-02 LAB — HEPATITIS A ANTIBODY, TOTAL: hep A Total Ab: POSITIVE — AB

## 2021-09-02 LAB — MITOCHONDRIAL ANTIBODIES: Mitochondrial Ab: <20 Units (ref 0.0–20.0)

## 2021-09-03 ENCOUNTER — Telehealth: Payer: Self-pay

## 2021-09-03 NOTE — Telephone Encounter (Signed)
CALLED PATIENT NO ANSWER LEFT VOICEMAIL FOR A CALL BACK ? ?

## 2021-09-03 NOTE — Telephone Encounter (Signed)
-----   Message from Lin Landsman, MD sent at 09/02/2021  4:55 PM EST ----- Please inform patient that the work-up of his elevated liver enzymes came back negative.  His liver enzymes are elevated secondary to underlying fatty liver.  RV

## 2021-09-06 ENCOUNTER — Telehealth: Payer: Self-pay

## 2021-09-06 NOTE — Telephone Encounter (Signed)
-----   Message from Lin Landsman, MD sent at 09/02/2021  4:55 PM EST ----- Please inform patient that the work-up of his elevated liver enzymes came back negative.  His liver enzymes are elevated secondary to underlying fatty liver.  RV

## 2021-09-06 NOTE — Telephone Encounter (Signed)
Called patient spoke with wife about results she is one his release paper she understands and will call us with any other questions she may have later

## 2021-09-10 ENCOUNTER — Other Ambulatory Visit: Payer: Self-pay | Admitting: Family Medicine

## 2021-09-10 DIAGNOSIS — I10 Essential (primary) hypertension: Secondary | ICD-10-CM | POA: Diagnosis not present

## 2021-09-10 DIAGNOSIS — I2510 Atherosclerotic heart disease of native coronary artery without angina pectoris: Secondary | ICD-10-CM | POA: Diagnosis not present

## 2021-09-10 DIAGNOSIS — I2584 Coronary atherosclerosis due to calcified coronary lesion: Secondary | ICD-10-CM

## 2021-09-10 DIAGNOSIS — E785 Hyperlipidemia, unspecified: Secondary | ICD-10-CM | POA: Diagnosis not present

## 2021-09-10 DIAGNOSIS — R54 Age-related physical debility: Secondary | ICD-10-CM | POA: Diagnosis not present

## 2021-09-13 DIAGNOSIS — I10 Essential (primary) hypertension: Secondary | ICD-10-CM | POA: Diagnosis not present

## 2021-09-13 DIAGNOSIS — K760 Fatty (change of) liver, not elsewhere classified: Secondary | ICD-10-CM | POA: Diagnosis not present

## 2021-09-13 DIAGNOSIS — R54 Age-related physical debility: Secondary | ICD-10-CM | POA: Diagnosis not present

## 2021-09-13 DIAGNOSIS — F0390 Unspecified dementia without behavioral disturbance: Secondary | ICD-10-CM | POA: Diagnosis not present

## 2021-09-13 DIAGNOSIS — Z6841 Body Mass Index (BMI) 40.0 and over, adult: Secondary | ICD-10-CM | POA: Diagnosis not present

## 2021-09-13 DIAGNOSIS — E785 Hyperlipidemia, unspecified: Secondary | ICD-10-CM | POA: Diagnosis not present

## 2021-09-13 DIAGNOSIS — I781 Nevus, non-neoplastic: Secondary | ICD-10-CM | POA: Diagnosis not present

## 2021-09-13 DIAGNOSIS — F1011 Alcohol abuse, in remission: Secondary | ICD-10-CM | POA: Diagnosis not present

## 2021-09-15 DIAGNOSIS — I10 Essential (primary) hypertension: Secondary | ICD-10-CM

## 2021-09-15 DIAGNOSIS — F0153 Vascular dementia, unspecified severity, with mood disturbance: Secondary | ICD-10-CM | POA: Diagnosis not present

## 2021-09-16 DIAGNOSIS — M5010 Cervical disc disorder with radiculopathy, unspecified cervical region: Secondary | ICD-10-CM | POA: Diagnosis not present

## 2021-09-16 DIAGNOSIS — R2681 Unsteadiness on feet: Secondary | ICD-10-CM | POA: Diagnosis not present

## 2021-09-20 ENCOUNTER — Telehealth: Payer: Self-pay | Admitting: Gastroenterology

## 2021-09-20 NOTE — Telephone Encounter (Signed)
Pt called again to r/s procedure

## 2021-09-20 NOTE — Telephone Encounter (Signed)
Inbound call from pt requesting a call back to cancel his procedure. Pt does need to r/s his appt. Thank you.

## 2021-09-22 ENCOUNTER — Ambulatory Visit: Payer: PPO | Admitting: Internal Medicine

## 2021-09-22 ENCOUNTER — Telehealth: Payer: Self-pay

## 2021-09-22 NOTE — Telephone Encounter (Signed)
PATIENT WANTED TO CANCEL COLONOSCOPY AND COME IN TO BE SEEN SO I MADE AN APPOINTMENT FOR 10/20/2021 AT 1 FOR PATIENT

## 2021-09-23 ENCOUNTER — Ambulatory Visit: Admission: RE | Admit: 2021-09-23 | Payer: PPO | Source: Home / Self Care | Admitting: Gastroenterology

## 2021-09-23 ENCOUNTER — Encounter: Admission: RE | Payer: Self-pay | Source: Home / Self Care

## 2021-09-23 ENCOUNTER — Telehealth: Payer: Self-pay | Admitting: Internal Medicine

## 2021-09-23 SURGERY — COLONOSCOPY WITH PROPOFOL
Anesthesia: General

## 2021-09-23 NOTE — Telephone Encounter (Signed)
Patient and wife called in have question about sleep study need more information

## 2021-09-24 NOTE — Telephone Encounter (Signed)
Spoke with Patient's wife [place].   States they were able to get in touch with the proper office

## 2021-09-29 ENCOUNTER — Ambulatory Visit (INDEPENDENT_AMBULATORY_CARE_PROVIDER_SITE_OTHER): Payer: PPO | Admitting: *Deleted

## 2021-09-29 DIAGNOSIS — I10 Essential (primary) hypertension: Secondary | ICD-10-CM

## 2021-09-29 DIAGNOSIS — F0153 Vascular dementia, unspecified severity, with mood disturbance: Secondary | ICD-10-CM

## 2021-10-02 DIAGNOSIS — G4733 Obstructive sleep apnea (adult) (pediatric): Secondary | ICD-10-CM | POA: Diagnosis not present

## 2021-10-02 NOTE — Chronic Care Management (AMB) (Signed)
Chronic Care Management   CCM RN Visit Note  10/02/2021 Name: Paul Cook MRN: [id number] DOB: [birth year]  Subjective: Paul Cook is a 70 y.o. year old male who is a primary care patient of McLean-Scocuzza, [name], MD. The care management team was consulted for assistance with disease management and care coordination needs.    Engaged with patient by telephone for follow up visit in response to provider referral for case management and/or care coordination services.   Consent to Services:  The patient was given information about Chronic Care Management services, agreed to services, and gave verbal consent prior to initiation of services.  Please see initial visit note for detailed documentation.   Patient agreed to services and verbal consent obtained.   Assessment: Review of patient past medical history, allergies, medications, health status, including review of consultants reports, laboratory and other test data, was performed as part of comprehensive evaluation and provision of chronic care management services.   SDOH (Social Determinants of Health) assessments and interventions performed:    CCM Care Plan  Allergies  Allergen Reactions   Nsaids Other (See Comments)    GI Issues    Outpatient Encounter Medications as of 09/29/2021  Medication Sig Note   amLODipine (NORVASC) 5 MG tablet Take 1 tablet (5 mg total) by mouth daily.    ASPIRIN LOW DOSE 81 MG EC tablet TAKE 1 TABLET BY MOUTH ONCE DAILY    ezetimibe (ZETIA) 10 MG tablet Take 1 tablet (10 mg total) by mouth daily.    gabapentin (NEURONTIN) 300 MG capsule Take 300 mg by mouth daily.    gabapentin (NEURONTIN) 600 MG tablet TAKE 1 TABLET(600 MG) BY MOUTH THREE TIMES DAILY 07/20/2021: 300 mg QPM   lisinopril-hydrochlorothiazide (ZESTORETIC) 20-12.5 MG tablet Take 2 tablets by mouth daily.    metoprolol succinate (TOPROL-XL) 25 MG 24 hr tablet TAKE 1 TABLET(25 MG) BY MOUTH DAILY    rOPINIRole (REQUIP) 2 MG tablet TAKE 3  TABLETS(6 MG) BY MOUTH AT BEDTIME    No facility-administered encounter medications on file as of 09/29/2021.    Patient Active Problem List   Diagnosis Date Noted   Urine incontinence 07/28/2021   OCD (obsessive compulsive disorder) 07/04/2021   Elevated liver enzymes 07/04/2021   Vascular dementia with depressed mood 07/02/2021   DOE (dyspnea on exertion) 08/06/2020   Abnormal EKG 08/06/2020   Coronary artery calcification seen on CT scan 07/13/2020   Coronary atherosclerosis due to calcified coronary lesion 06/10/2020   Aortic atherosclerosis ([place]) 06/10/2020   Lung nodule seen on imaging study 06/10/2020   Cyst of left kidney 05/29/2020   Gallstone 05/29/2020   Fatty liver 05/29/2020   Bilateral leg edema 05/15/2020   Venous stasis dermatitis of right lower extremity 05/15/2020   Right ankle swelling 12/17/2019   HLD (hyperlipidemia) 11/09/2018   History of alcohol abuse 09/06/2018   Chronic pain of right ankle 09/06/2018   Vitamin D deficiency 06/29/2018   Prediabetes 06/28/2018   Plantar fasciitis 08/29/2016   GERD (gastroesophageal reflux disease) 12/18/2015   Restless legs 03/19/2015   Essential hypertension 01/14/2014   Obstructive sleep apnea 12/17/2013   IBS (irritable bowel syndrome) 07/30/2013   Insomnia 06/12/2013   Cervical disc disorder with radiculopathy of cervical region 06/12/2013   Morbid obesity with BMI of 50.0-59.9, adult ([place]) 06/12/2013   Seasonal allergies 06/12/2013   Anxiety and depression 06/12/2013    Conditions to be addressed/monitored:HTN and Dementia  Care Plan : CCM RNCM Care Plan  Updates made by  Leona Singleton, RN since 10/02/2021 12:00 AM     Problem: Increasing forgetfulness related to dementia causing dificulties with self cae management of hypertension   Priority: Medium     Long-Range Goal: Patient and wife will work with CCM team to assist in learning more about dementia to help self care management of cormobidities    Start Date: 07/09/2021  Expected End Date: 02/13/2022  Recent Progress: Not on track  Priority: Medium  Note:   Current Barriers:  Knowledge Deficits related to plan of care for management of HTN and Dementia  Care Coordination needs related to Cognitive Deficits, Memory Deficits, and assistance arranging GI appointment  Cognitive Deficits;   History of hypertension and not monitoring blood pressures at home.  Wife has noticed increase in confusion for patient.  Lives at home with wife, currently at home alone, whole wife recovers from hip surgery at daughters house.  Per wife, patient continues to drive; does not feel well today, has cold and bad cough.  Wife continues to manage meds through pill box, although he missed multiple days a few weeks ago while she was not home, but states it has gotten better.  Spoke with wife, not patient.  Wife continues to be home with patient.  States patient is about the same, maybe a little better now that she is home to make sure he is getting his medications.  States his alcohol intake is less but he sometimes is still able to sneak it in.  Wife wants to visit kids out of town but is afraid to leave patient alone (encouraged to discuss with other family).  Has had GI eval with coloscopy and endoscopy.  Just completed sleep study this morning.  Reports blood pressures at home have been good, 120-130/70-80. Wife is asking for Handicap Placard for patient as his mobility is limited due to weight and knees.  RNCM Clinical Goal(s):  Patient will verbalize understanding of plan for management of HTN and Dementia  take all medications exactly as prescribed and will call provider for medication related questions continue to work with RN Care Manager to address care management and care coordination needs related to HTN and Dementia  work with Education officer, museum to address [place]abella Concerns , Cognitive Deficits, and Memory Deficits related to the management of Dementia  through collaboration with RN Care manager, provider, and care team.  Wife will report monitoring blood pressure at home weekly within the next 45 days.  Interventions: 1:1 collaboration with primary care provider regarding development and update of comprehensive plan of care as evidenced by provider attestation and co-signature Inter-disciplinary care team collaboration (see longitudinal plan of care) Evaluation of current treatment plan related to  self management and patient's adherence to plan as established by provider Attend appointment with GI 08/31/21 Encouraged no alcohol  Dementia  (Status: Goal on track: NO.) Evaluation of current treatment plan related to Dementia, Limited education about Dementia*, Cognitive Deficits, and Memory Deficits self-management and patient's adherence to plan as established by provider. Provided education to patient re: Dementia; Discussed plans with patient for ongoing care management follow up and provided patient with direct contact information for care management team; Advised patient to discuss diagnosis and medications with provider; Assessed social determinant of health barriers;  Follow up with Neurologist to discuss OT referral  Discussed with wife patient should not be drinking and driving; encouraged wife to discuss driving with patient and Neurologist Discussed dangers of alcohol with dementia and driving and encouraged wife  to discuss with providers Discussed Handicap sticker and encouraged wife to request form from PCP  Hypertension: (Status: Goal on Track (progressing): YES.) Last practice recorded BP readings:  BP Readings from Last 3 Encounters:  08/31/21 125/79  08/17/21 120/72  07/27/21 118/70  Most recent eGFR/CrCl: No results found for: EGFR  No components found for: CRCL  Evaluation of current treatment plan related to hypertension self management and patient's adherence to plan as established by provider;   Reviewed  medications with patient and discussed importance of compliance;  Advised patient, providing education and rationale, to monitor blood pressure daily and record, calling PCP for findings outside established parameters;  Discussed complications of poorly controlled blood pressure such as heart disease, stroke, circulatory complications, vision complications, kidney impairment, sexual dysfunction;  Encouraged to continue to take and record blood pressures at least weekly for provider review  Discussed low salt heart healthy diet  Patient Goals/Self-Care Activities: Patient will attend all scheduled provider appointments as evidenced by clinician review of documented attendance to scheduled appointments and patient/caregiver report Patient will continue to perform ADL's independently as evidenced by patient/caregiver report Patient will call provider office for new concerns or questions as evidenced by review of documented incoming telephone call notes and patient report Check blood pressure weekly Write blood pressure results in a log for provider review Keep medicine where it is safe or locked Do not drive; do not drink and drive Use appliances that have an auto shut-off feature  Keep patient active during the day; do more crossword puzzle Limit naps throughout the day Do one enjoyable thing every day; laugh; watch a funny movie or comedian Eat healthy; do not eat or exercise right before bedtime Get outdoors every day (weather permitting) Spend time or talk with others at least 2 to 3 times per week       Plan:The care management team will reach out to the patient again over the next 45 days.  Hubert Azure RN, MSN RN Care Management Coordinator [place] [phone] .[email or web address]

## 2021-10-02 NOTE — Patient Instructions (Addendum)
Visit Information  Thank you for taking time to visit with me today. Please don't hesitate to contact me if I can be of assistance to you before our next scheduled telephone appointment.  Following are the goals we discussed today:  Patient will attend all scheduled provider appointments as evidenced by clinician review of documented attendance to scheduled appointments and patient/caregiver report Patient will continue to perform ADL's independently as evidenced by patient/caregiver report Patient will call provider office for new concerns or questions as evidenced by review of documented incoming telephone call notes and patient report Check blood pressure weekly Write blood pressure results in a log for provider review Keep medicine where it is safe or locked Do not drive; do not drink and drive Use appliances that have an auto shut-off feature  Keep patient active during the day; do more crossword puzzle Limit naps throughout the day Do one enjoyable thing every day; laugh; watch a funny movie or comedian Eat healthy; do not eat or exercise right before bedtime Get outdoors every day (weather permitting) Spend time or talk with others at least 2 to 3 times per week   Our next appointment is by telephone on 11/10/21 at 1000 Please call the care guide team at [phone] if you need to cancel or reschedule your appointment.   If you are experiencing a Mental Health or [place] or need someone to talk to, please call the Suicide and Crisis Lifeline: 988 call the [place] National Suicide Prevention Lifeline: 708-244-3281 or TTY: 915-221-0156 TTY (206)668-7321) to talk to a trained counselor call 1-800-273-TALK (toll free, 24 hour hotline) call 911   The patient verbalized understanding of instructions, educational materials, and care plan provided today and declined offer to receive copy of patient instructions, educational materials, and care plan.   Hubert Azure RN,  MSN RN Care Management Coordinator [place] [phone] [email or web address]

## 2021-10-05 DIAGNOSIS — M9901 Segmental and somatic dysfunction of cervical region: Secondary | ICD-10-CM | POA: Diagnosis not present

## 2021-10-05 DIAGNOSIS — M9903 Segmental and somatic dysfunction of lumbar region: Secondary | ICD-10-CM | POA: Diagnosis not present

## 2021-10-05 DIAGNOSIS — M9902 Segmental and somatic dysfunction of thoracic region: Secondary | ICD-10-CM | POA: Diagnosis not present

## 2021-10-05 DIAGNOSIS — M9906 Segmental and somatic dysfunction of lower extremity: Secondary | ICD-10-CM | POA: Diagnosis not present

## 2021-10-12 ENCOUNTER — Encounter: Payer: Self-pay | Admitting: Internal Medicine

## 2021-10-12 ENCOUNTER — Other Ambulatory Visit: Payer: Self-pay

## 2021-10-12 ENCOUNTER — Telehealth (INDEPENDENT_AMBULATORY_CARE_PROVIDER_SITE_OTHER): Payer: PPO | Admitting: Internal Medicine

## 2021-10-12 VITALS — BP 130/70 | Ht 66.0 in | Wt 315.0 lb

## 2021-10-12 DIAGNOSIS — I10 Essential (primary) hypertension: Secondary | ICD-10-CM

## 2021-10-12 DIAGNOSIS — E559 Vitamin D deficiency, unspecified: Secondary | ICD-10-CM | POA: Diagnosis not present

## 2021-10-12 DIAGNOSIS — M25471 Effusion, right ankle: Secondary | ICD-10-CM

## 2021-10-12 DIAGNOSIS — Z125 Encounter for screening for malignant neoplasm of prostate: Secondary | ICD-10-CM

## 2021-10-12 DIAGNOSIS — R739 Hyperglycemia, unspecified: Secondary | ICD-10-CM | POA: Diagnosis not present

## 2021-10-12 DIAGNOSIS — G4733 Obstructive sleep apnea (adult) (pediatric): Secondary | ICD-10-CM

## 2021-10-12 DIAGNOSIS — E785 Hyperlipidemia, unspecified: Secondary | ICD-10-CM

## 2021-10-12 DIAGNOSIS — M76821 Posterior tibial tendinitis, right leg: Secondary | ICD-10-CM | POA: Diagnosis not present

## 2021-10-12 DIAGNOSIS — I517 Cardiomegaly: Secondary | ICD-10-CM | POA: Diagnosis not present

## 2021-10-12 DIAGNOSIS — G4719 Other hypersomnia: Secondary | ICD-10-CM

## 2021-10-12 DIAGNOSIS — Z1211 Encounter for screening for malignant neoplasm of colon: Secondary | ICD-10-CM

## 2021-10-12 DIAGNOSIS — F5104 Psychophysiologic insomnia: Secondary | ICD-10-CM | POA: Diagnosis not present

## 2021-10-12 DIAGNOSIS — M47816 Spondylosis without myelopathy or radiculopathy, lumbar region: Secondary | ICD-10-CM

## 2021-10-12 DIAGNOSIS — R748 Abnormal levels of other serum enzymes: Secondary | ICD-10-CM

## 2021-10-12 DIAGNOSIS — N3281 Overactive bladder: Secondary | ICD-10-CM | POA: Diagnosis not present

## 2021-10-12 DIAGNOSIS — Z1329 Encounter for screening for other suspected endocrine disorder: Secondary | ICD-10-CM | POA: Diagnosis not present

## 2021-10-12 DIAGNOSIS — Z20822 Contact with and (suspected) exposure to covid-19: Secondary | ICD-10-CM

## 2021-10-12 HISTORY — DX: Cardiomegaly: I51.7

## 2021-10-12 HISTORY — DX: Posterior tibial tendinitis, right leg: M76.821

## 2021-10-12 MED ORDER — EZETIMIBE 10 MG PO TABS: 10.0000 mg | ORAL_TABLET | Freq: Every day | ORAL | 3 refills | Status: DC

## 2021-10-12 MED ORDER — AMLODIPINE BESYLATE 5 MG PO TABS: 5.0000 mg | ORAL_TABLET | Freq: Every day | ORAL | 3 refills | Status: DC

## 2021-10-12 MED ORDER — MIRABEGRON ER 25 MG PO TB24: 25.0000 mg | ORAL_TABLET | Freq: Every day | ORAL | 0 refills | Status: DC

## 2021-10-12 MED ORDER — METOPROLOL SUCCINATE ER 25 MG PO TB24: ORAL_TABLET | ORAL | 3 refills | Status: DC

## 2021-10-12 MED ORDER — LISINOPRIL-HYDROCHLOROTHIAZIDE 20-12.5 MG PO TABS: 2.0000 | ORAL_TABLET | Freq: Every day | ORAL | 3 refills | Status: DC

## 2021-10-12 NOTE — Patient Instructions (Addendum)
Call insurance # on the back of the insurance card to determine cost of Physical therapy outpatient or at home and let me know if either desired    Handicap sticker to license tag place not DMV    Mirabegron Extended-release Oral Tablets What is this medication? MIRABEGRON (MIR a BEG ron) is used to treat overactive bladder. This medicine reduces the amount of bathroom visits. It may also help to control wetting accidents. This medicine may be used for other purposes; ask your health care provider or pharmacist if you have questions. COMMON BRAND NAME(S): Myrbetriq What should I tell my care team before I take this medication? They need to know if you have any of these conditions: high blood pressure kidney disease liver disease problems urinating prostate disease an unusual or allergic reaction to mirabegron, other medicines, foods, dyes, or preservatives pregnant or trying to get pregnant breast-feeding How should I use this medication? Take this medicine by mouth with water. Take it as directed on the prescription label at the same time every day. Do not cut, crush or chew this medicine. Swallow the tablets whole. Adults can take it with or without food. Children should take it with food. Keep taking it unless your health care provider tells you to stop. Talk to your health care provider about the use of this medicine in children. While it may be prescribed for children as young as 3 years for selected conditions, precautions do apply. Overdosage: If you think you have taken too much of this medicine contact a poison control center or emergency room at once. NOTE: This medicine is only for you. Do not share this medicine with others. What if I miss a dose? If you miss a dose, take it as soon as you can unless it is more than 12 hours late. If it is more than 12 hours late, skip the missed dose. Take the next dose at the normal time. What may interact with this  medication? codeine desipramine digoxin flecainide MAOIs like Carbex, Eldepryl, Marplan, Nardil, and Parnate methadone metoprolol pimozide propafenone thioridazine warfarin This list may not describe all possible interactions. Give your health care provider a list of all the medicines, herbs, non-prescription drugs, or dietary supplements you use. Also tell them if you smoke, drink alcohol, or use illegal drugs. Some items may interact with your medicine. What should I watch for while using this medication? Visit your health care provider for regular checks on your progress. Tell your health care provider if your symptoms do not start to get better or if they get worse. Check your blood pressure as directed. Ask your doctor or health care professional what your blood pressure should be and when you should contact him or her. What side effects may I notice from receiving this medication? Side effects that you should report to your doctor or health care professional as soon as possible: allergic reactions (skin rash, itching or hives; swelling of the face, lips, or tongue) increase in blood pressure fast, irregular heartbeat infection (fever, chills, pain or trouble passing urine) trouble passing urine Side effects that usually do not require medical attention (report these to your doctor or health care professional if they continue or are bothersome): constipation dry mouth headache nausea sore throat This list may not describe all possible side effects. Call your doctor for medical advice about side effects. You may report side effects to FDA at 1-800-FDA-1088. Where should I keep my medication? Keep out of the reach of children and  pets. Store at room temperature between 20 and 25 degrees C (68 and 77 degrees F). Get rid of any unused medicine after the expiration date. To get rid of medicines that are no longer needed or have expired: Take the medicine to a medicine take-back  program. Check with your pharmacy or law enforcement to find a location. If you cannot return the medicine, check the label or package insert to see if the medicine should be thrown out in the garbage or flushed down the toilet. If you are not sure, ask your health care provider. If it is safe to put it in the trash, empty the medicine out of the container. Mix the medicine with cat litter, dirt, coffee grounds, or other unwanted substance. Seal the mixture in a bag or container. Put it in the trash. NOTE: This sheet is a summary. It may not cover all possible information. If you have questions about this medicine, talk to your doctor, pharmacist, or health care provider.  2022 Elsevier/Gold Standard (2020-07-03 00:00:00)  Urinary Frequency, Adult Urinary frequency means urinating more often than usual. You may urinate every 1-2 hours even though you drink a normal amount of fluid and do not have a bladder infection or condition. Although you urinate more often than normal, the total amount of urine produced in a day is normal. With urinary frequency, you may have an urgent need to urinate often. The stress and anxiety of needing to find a bathroom quickly can make this urge worse. This condition may go away on its own, or you may need treatment at home. Home treatment may include bladder training, exercises, taking medicines, or making changes to your diet. Follow these instructions at home: Bladder health Your health care provider will tell you what to do to improve bladder health. You may be told to: Keep a bladder diary. Keep track of: What you eat and drink. How often you urinate. How much you urinate. Follow a bladder training program. This may include: Learning to delay going to the bathroom. Double urinating, also called voiding. This helps if you are not completely emptying your bladder. Scheduled voiding. Do Kegel exercises. Kegel exercises strengthen the muscles that help control  urination, which may help the condition.  Eating and drinking Follow instructions from your health care provider about eating or drinking restrictions. You may be told to: Avoid caffeine. Drink fewer fluids, especially alcohol. Avoid drinking in the evening. Avoid foods or drinks that may irritate the bladder. These include coffee, tea, soda, artificial sweeteners, citrus, tomato-based foods, and chocolate. Eat foods that help prevent or treat constipation. Constipation can make urinary frequency worse. You may need to take these actions to prevent or treat constipation: Drink enough fluid to keep your urine pale yellow. Take over-the-counter or prescription medicines. Eat foods that are high in fiber, such as beans, whole grains, and fresh fruits and vegetables. Limit foods that are high in fat and processed sugars, such as fried or sweet foods. General instructions Take over-the-counter and prescription medicines only as told by your health care provider. Keep all follow-up visits. This is important. Contact a health care provider if: You start urinating more often. You feel pain or irritation when you urinate. You notice blood in your urine. Your urine looks cloudy. You develop a fever. You begin vomiting. Get help right away if: You are unable to urinate. Summary Urinary frequency means urinating more often than usual. With urinary frequency, you may urinate every 1-2 hours even though you  drink a normal amount of fluid and do not have a bladder infection or other bladder condition. Your health care provider may recommend that you keep a bladder diary, follow a bladder training program, or make dietary changes. If told by your health care provider, do Kegel exercises to strengthen the muscles that help control urination. Take over-the-counter and prescription medicines only as told by your health care provider. Contact a health care provider if your symptoms do not improve or get  worse. This information is not intended to replace advice given to you by your health care provider. Make sure you discuss any questions you have with your health care provider. Document Revised: 05/08/2020 Document Reviewed: 05/08/2020 Elsevier Patient Education  [place].  Overactive Bladder, Adult Overactive bladder is a condition in which a person has a sudden and frequent need to urinate. A person might also leak urine if he or she cannot get to the bathroom fast enough (urinary incontinence). Sometimes, symptoms can interfere with work or social activities. What are the causes? Overactive bladder is associated with poor nerve signals between your bladder and your brain. Your bladder may get the signal to empty before it is full. You may also have very sensitive muscles that make your bladder squeeze too soon. This condition may also be caused by other factors, such as: Medical conditions: Urinary tract infection. Infection of nearby tissues. Prostate enlargement. Bladder stones, inflammation, or tumors. Diabetes. Muscle or nerve weakness, especially from these conditions: A spinal cord injury. Stroke. Multiple sclerosis. Parkinson's disease. Other causes: Surgery on the uterus or urethra. Drinking too much caffeine or alcohol. Certain medicines, especially those that eliminate extra fluid in the body (diuretics). Constipation. What increases the risk? You may be at greater risk for overactive bladder if you: Are an older adult. Smoke. Are going through menopause. Have prostate problems. Have a neurological disease, such as stroke, dementia, Parkinson's disease, or multiple sclerosis (MS). Eat or drink alcohol, spicy food, caffeine, and other things that irritate the bladder. Are overweight or obese. What are the signs or symptoms? Symptoms of this condition include a sudden, strong urge to urinate. Other symptoms include: Leaking urine. Urinating 8 or more times a  day. Waking up to urinate 2 or more times overnight. How is this diagnosed? This condition may be diagnosed based on: Your symptoms and medical history. A physical exam. Blood or urine tests to check for possible causes, such as infection. You may also need to see a health care provider who specializes in urinary tract problems. This is called a urologist. How is this treated? Treatment for overactive bladder depends on the cause of your condition and whether it is mild or severe. Treatment may include: Bladder training, such as: Learning to control the urge to urinate by following a schedule to urinate at regular intervals. Doing Kegel exercises to strengthen the pelvic floor muscles that support your bladder. Special devices, such as: Biofeedback. This uses sensors to help you become aware of your body's signals. Electrical stimulation. This uses electrodes placed inside the body (implanted) or outside the body. These electrodes send gentle pulses of electricity to strengthen the nerves or muscles that control the bladder. Women may use a plastic device, called a pessary, that fits into the vagina and supports the bladder. Medicines, such as: Antibiotics to treat bladder infection. Antispasmodics to stop the bladder from releasing urine at the wrong time. Tricyclic antidepressants to relax bladder muscles. Injections of botulinum toxin type A directly into the  bladder tissue to relax bladder muscles. Surgery, such as: A device may be implanted to help manage the nerve signals that control urination. An electrode may be implanted to stimulate electrical signals in the bladder. A procedure may be done to change the shape of the bladder. This is done only in very severe cases. Follow these instructions at home: Eating and drinking  Make diet or lifestyle changes recommended by your health care provider. These may include: Drinking fluids throughout the day and not only with  meals. Cutting down on caffeine or alcohol. Eating a healthy and balanced diet to prevent constipation. This may include: Choosing foods that are high in fiber, such as beans, whole grains, and fresh fruits and vegetables. Limiting foods that are high in fat and processed sugars, such as fried and sweet foods. Lifestyle  Lose weight if needed. Do not use any products that contain nicotine or tobacco. These include cigarettes, chewing tobacco, and vaping devices, such as e-cigarettes. If you need help quitting, ask your health care provider. General instructions Take over-the-counter and prescription medicines only as told by your health care provider. If you were prescribed an antibiotic medicine, take it as told by your health care provider. Do not stop taking the antibiotic even if you start to feel better. Use any implants or pessary as told by your health care provider. If needed, wear pads to absorb urine leakage. Keep a log to track how much and when you drink, and when you need to urinate. This will help your health care provider monitor your condition. Keep all follow-up visits. This is important. Contact a health care provider if: You have a fever or chills. Your symptoms do not get better with treatment. Your pain and discomfort get worse. You have more frequent urges to urinate. Get help right away if: You are not able to control your bladder. Summary Overactive bladder refers to a condition in which a person has a sudden and frequent need to urinate. Several conditions may lead to an overactive bladder. Treatment for overactive bladder depends on the cause and severity of your condition. Making lifestyle changes, doing Kegel exercises, keeping a log, and taking medicines can help with this condition. This information is not intended to replace advice given to you by your health care provider. Make sure you discuss any questions you have with your health care  provider. Document Revised: 06/22/2020 Document Reviewed: 06/22/2020 Elsevier Patient Education  [place].

## 2021-10-12 NOTE — Progress Notes (Addendum)
Telephone Note  I connected with Paul Cook  on 10/12/21 at  4:18 PM EST by telephone and verified that I am speaking with the correct person using two identifiers.  Location patient: home, [place] Location provider:work or home office Persons participating in the virtual visit: patient, provider  I discussed the limitations of evaluation and management by telemedicine and the availability of in person appointments. The patient expressed understanding and agreed to proceed.   HPI:  Acute telemedicine visit for : Wife has covid today today 10/12/21 he is not ill  Handicap sticker filled out today and will be mailed  Requesting PT neck and back pain  He is seeing chiropractor  Pt c/w cost  4. Co overactive bladder chronically esp if watching a thriller movie x years denies urinary leakage and wakes him up to pee at him during the night   5. Chronic insomnia bedtime 10 pm and falls asleep 1 am and has trouble falling asleep he did sleep study 2 weeks ago total 7.5-8 hours     -COVID-19 vaccine status:2/2 ? If had 3rd dose  ROS: See pertinent positives and negatives per HPI.  Past Medical History:  Diagnosis Date   Adenomatous colon polyp    tubular   Adenomatous rectal polyp    tubular   Asthma    Depression    Diverticulosis    GERD (gastroesophageal reflux disease)    History of chicken pox    Hyperlipidemia    Hypertension    Lung nodule    CT 10/2014   OSA (obstructive sleep apnea)    Sleep apnea    wears cpap   Vitamin D deficiency     Past Surgical History:  Procedure Laterality Date   COLONOSCOPY     COLONOSCOPY W/ POLYPECTOMY  2015   [facility], benign   HERNIA REPAIR     inguinal   POLYPECTOMY     TONSILLECTOMY     age 70      Current Outpatient Medications:    ASPIRIN LOW DOSE 81 MG EC tablet, TAKE 1 TABLET BY MOUTH ONCE DAILY, Disp: 90 tablet, Rfl: 1   gabapentin (NEURONTIN) 300 MG capsule, Take 300 mg by mouth daily., Disp: , Rfl:    gabapentin  (NEURONTIN) 600 MG tablet, TAKE 1 TABLET(600 MG) BY MOUTH THREE TIMES DAILY, Disp: 270 tablet, Rfl: 0   mirabegron ER (MYRBETRIQ) 25 MG TB24 tablet, Take 1 tablet (25 mg total) by mouth daily. Try around 7 pm. Pt has elevated liver enzymes not able to do 50 mg which insurance states will approve, Disp: 30 tablet, Rfl: 0   rOPINIRole (REQUIP) 2 MG tablet, TAKE 3 TABLETS(6 MG) BY MOUTH AT BEDTIME, Disp: 270 tablet, Rfl: 1   amLODipine (NORVASC) 5 MG tablet, Take 1 tablet (5 mg total) by mouth daily., Disp: 90 tablet, Rfl: 3   ezetimibe (ZETIA) 10 MG tablet, Take 1 tablet (10 mg total) by mouth daily., Disp: 90 tablet, Rfl: 3   lisinopril-hydrochlorothiazide (ZESTORETIC) 20-12.5 MG tablet, Take 2 tablets by mouth daily., Disp: 180 tablet, Rfl: 3   metoprolol succinate (TOPROL-XL) 25 MG 24 hr tablet, TAKE 1 TABLET(25 MG) BY MOUTH DAILY, Disp: 90 tablet, Rfl: 3  EXAM:  VITALS per patient if applicable:  GENERAL: alert, oriented, appears well and in no acute distress  PSYCH/NEURO: pleasant and cooperative, no obvious depression or anxiety, speech and thought processing grossly intact  ASSESSMENT AND PLAN:  Discussed the following assessment and plan:  Essential hypertension -  Plan: amLODipine (NORVASC) 5 MG tablet, lisinopril-hydrochlorothiazide (ZESTORETIC) 20-12.5 MG tablet, metoprolol succinate (TOPROL-XL) 25 MG 24 hr tablet, Comprehensive metabolic panel, Lipid panel, CBC with Differential/Platelet  Daytime hypersomnolence/chronic insomnia Pending home sleep study results not heard from report cc'ed Dr. Manuella Ghazi  F/u psychiatry chronic insomnia   Facet arthritis, degenerative, lumbar spine Neck pain  Consider home pt/ot vs outpatient pt to let me know   Right ankle swelling Chronic following with podiatry  Posterior tibial tendinitis of right lower extremity  LVH (left ventricular hypertrophy)  Hyperlipidemia, unspecified hyperlipidemia type - Plan: ezetimibe (ZETIA) 10 MG tablet, Lipid  panel  Overactive bladder - Plan: mirabegron ER (MYRBETRIQ) 25 MG TB24 tablet, Urinalysis, Routine w reflex microscopic, Urine Culture Check PSA  Hyperglycemia - Plan: Hemoglobin A1c  Elevated liver enzymes - Plan: Comprehensive metabolic panel  Covid exposure   HM Flu shot utd  tdap utd, prevnar, pna 23  Consider shingrix in future Zostervax 12/18/13  covid 19 vx had 2/2 pfizer consider booster Check with [facility]     Never smoker  Nl PSA 1.04 07/09/19   Colonoscopy h/o multiple polyps due 04/12/2019 [facility]  -pt wants to hold as of 10/17/2019 and 01/21/20, 10/12/21    Consider derm in future not needed now   MMR immune and hep C neg   rec healthy diet and exercise   Consider dermatology referral for isks trunk and Sks and skin cancer screening in future pt will let me know if wants in future    Overactive bladder Myrbetriq/gemtasa too expensive and pharm d states below 03/11/22 [place], Maude Leriche, RPH  McLean-Scocuzza, [name], MD  [place],   There are only 2 generic options covered on the patient's [employer] PPO formulary.   -IR and ER formulations of Oxybutynin:  Oxybutynin IR 5 mg tablets;         Oxybutynin ER 5 mg, 10 mg, 15 mg tablets   OR  -Solifenacin succinate 5 mg, 10 mg   Thank you,   Loretha Brasil, PharmD  [place]  Clinical Pharmacist  Office: [phone]  -we discussed possible serious and likely etiologies, options for evaluation and workup, limitations of telemedicine visit vs in person visit, treatment, treatment risks and precautions. Pt is agreeable to treatment via telemedicine at this moment.     I discussed the assessment and treatment plan with the patient. The patient was provided an opportunity to ask questions and all were answered. The patient agreed with the plan and demonstrated an understanding of the instructions.    Time spent 20 minutes  Delorise Jackson, MD

## 2021-10-13 ENCOUNTER — Telehealth: Payer: Self-pay

## 2021-10-13 DIAGNOSIS — G4733 Obstructive sleep apnea (adult) (pediatric): Secondary | ICD-10-CM | POA: Diagnosis not present

## 2021-10-13 NOTE — Telephone Encounter (Signed)
CALLED PATIENT NO ANSWER LEFT VOICEMAIL FOR A CALL BACK ? ?

## 2021-10-14 ENCOUNTER — Telehealth: Payer: Self-pay | Admitting: Internal Medicine

## 2021-10-14 DIAGNOSIS — M9906 Segmental and somatic dysfunction of lower extremity: Secondary | ICD-10-CM | POA: Diagnosis not present

## 2021-10-14 DIAGNOSIS — M9901 Segmental and somatic dysfunction of cervical region: Secondary | ICD-10-CM | POA: Diagnosis not present

## 2021-10-14 DIAGNOSIS — M9903 Segmental and somatic dysfunction of lumbar region: Secondary | ICD-10-CM | POA: Diagnosis not present

## 2021-10-14 DIAGNOSIS — M9902 Segmental and somatic dysfunction of thoracic region: Secondary | ICD-10-CM | POA: Diagnosis not present

## 2021-10-14 NOTE — Addendum Note (Signed)
Addended by: Orland Mustard on: 10/14/2021 05:04 PM   Modules accepted: Orders

## 2021-10-14 NOTE — Telephone Encounter (Signed)
Pt wife called in stating that she needs help with getting Pt a CPAP machine. Pt wife stated that she had tired call the neurology Dr. Abbott Pao wife stated that its hard to get through to there office. Pt wife stated that for 3 to 4 years they have been trying to get the Pt the help he needs. Pt was wondering if Dr. [name] can help them with there situation. Pt wife is requesting callback.

## 2021-10-14 NOTE — Telephone Encounter (Signed)
Called and spoke with the Patient's wife. She states the CPAP Neurology ordered is on back log. She states the Patient needs some type of alternative or a machine from somewhere else.   Informed the Patient that we do not do CPAP supplies. Informed them that we could put in a referral to Pulmonology to get some help with an alternative or a different machine.   They are agreeable to a referral to Pulmonology

## 2021-10-14 NOTE — Telephone Encounter (Signed)
Referral in pulmonary

## 2021-10-16 DIAGNOSIS — F0153 Vascular dementia, unspecified severity, with mood disturbance: Secondary | ICD-10-CM

## 2021-10-16 DIAGNOSIS — I10 Essential (primary) hypertension: Secondary | ICD-10-CM | POA: Diagnosis not present

## 2021-10-17 DIAGNOSIS — R2681 Unsteadiness on feet: Secondary | ICD-10-CM | POA: Diagnosis not present

## 2021-10-17 DIAGNOSIS — M5010 Cervical disc disorder with radiculopathy, unspecified cervical region: Secondary | ICD-10-CM | POA: Diagnosis not present

## 2021-10-20 ENCOUNTER — Telehealth: Payer: Self-pay | Admitting: Internal Medicine

## 2021-10-20 ENCOUNTER — Other Ambulatory Visit: Payer: Self-pay

## 2021-10-20 ENCOUNTER — Encounter: Payer: Self-pay | Admitting: Gastroenterology

## 2021-10-20 ENCOUNTER — Ambulatory Visit (INDEPENDENT_AMBULATORY_CARE_PROVIDER_SITE_OTHER): Payer: PPO | Admitting: Gastroenterology

## 2021-10-20 VITALS — BP 115/63 | HR 75 | Temp 98.9°F | Ht 66.0 in | Wt 321.4 lb

## 2021-10-20 DIAGNOSIS — F411 Generalized anxiety disorder: Secondary | ICD-10-CM | POA: Diagnosis not present

## 2021-10-20 DIAGNOSIS — K625 Hemorrhage of anus and rectum: Secondary | ICD-10-CM

## 2021-10-20 DIAGNOSIS — R7989 Other specified abnormal findings of blood chemistry: Secondary | ICD-10-CM | POA: Diagnosis not present

## 2021-10-20 MED ORDER — AMITRIPTYLINE HCL 10 MG PO TABS: 20.0000 mg | ORAL_TABLET | Freq: Every day | ORAL | 0 refills | Status: DC

## 2021-10-20 NOTE — Progress Notes (Signed)
Paul Darby, MD [address]  [place]  [place], [place] [address]  Main: [phone]  Fax: [phone]    Gastroenterology Consultation  Referring Provider:     McLean-Scocuzza, [name] * Primary Care Physician:  McLean-Scocuzza, [name], MD Primary Gastroenterologist:  Dr. Cephas Cook Reason for Consultation:     Elevated LFTs, fatty liver        HPI:   Paul Cook is a 71 y.o. male referred by Dr. Terese Door, [name], MD  for consultation & management of elevated LFTs.  Patient is found to have abnormal liver enzymes, first detected in 4/21, AST 59, ALT 84, most recently AST 67, ALT 95, alkaline phosphatase and total bilirubin normal.  No evidence of thrombocytopenia.  Ultrasound revealed fatty liver.  Hep C antibody negative, TSH normal.  Hemoglobin A1c 6.1.  Patient denies any GI symptoms.  He does acknowledge drinking diet sodas, fruit juices, sweet tea daily.  He does eat hamburger twice a week.  Patient is accompanied by his wife today.  No evidence of anemia  Patient does drink alcohol, he used to drink alcohol 20 years ago, restarted about 2 years ago since he stopped working.  He drinks beer mostly, sometimes up to 50 beers in 2 days  Follow-up visit 10/20/2021 Patient is here along with his wife to discuss about colonoscopy.  Patient canceled his colonoscopy due to concern about side effects from anesthesia.  Patient's wife states that he had prolonged recovery and memory issues after his last colonoscopy from 2017.  He had 2 go to emergency room at that time.  Patient also has severe sleep apnea, has not been on CPAP.  He is waiting for his CPAP machine to arrive since his current one broke.  Both patient and his wife would like to know alternate options for his colonoscopy.  I tried to tell the patient that we perform colonoscopy under propofol and there are minimal side effects from propofol.  However, they are interested in noninvasive tests.  I discussed with  them about Cologuard and CT colonography.  They prefer Cologuard test, I informed the patient that the Cologuard test is only 92 to 95% sensitive in detecting colon adenomas and colorectal cancer.  I also informed him that if either of these test come back positive, then a colonoscopy would be indicated  Patient states that he continues to drink beer up to 2-3 daily, he is requesting antianxiety medication  NSAIDs: None  Antiplts/Anticoagulants/Anti thrombotics: None  GI Procedures:  Colonoscopy 04/11/2016 - Diverticulosis in the entire examined colon, worst in the left colon. - One 5 mm polyp in the cecum, removed with a cold snare. Resected and retrieved. - One 4 mm polyp in the ascending colon, removed with a cold biopsy forceps. Resected and retrieved. - One diminutive polyp in the transverse colon, removed with a cold biopsy forceps. Resected and retrieved. - One 5 mm polyp in the transverse colon, removed with a cold snare. Resected and retrieved. - A tattoo was seen in the sigmoid colon. The tattoo site appeared normal. - Non-bleeding internal hemorrhoids. - The examination was otherwise normal.  Diagnosis Surgical [P], transverse, ascending and cecum, polyp (4) - TUBULAR ADENOMA (X1 FRAGMENT). - SESSILE SERRATED POLYP/ADENOMA (X2 FRAGMENTS). - BENIGN COLONIC MUCOSA (X1 FRAGMENT). - NO HIGH DYSPLASIA OR MALIGNANCY.  Past Medical History:  Diagnosis Date   Adenomatous colon polyp    tubular   Adenomatous rectal polyp    tubular  Asthma    Depression    Diverticulosis    GERD (gastroesophageal reflux disease)    History of chicken pox    Hyperlipidemia    Hypertension    Lung nodule    CT 10/2014   OSA (obstructive sleep apnea)    Sleep apnea    wears cpap   Vitamin D deficiency     Past Surgical History:  Procedure Laterality Date   COLONOSCOPY     COLONOSCOPY W/ POLYPECTOMY  2015   [facility], benign   HERNIA REPAIR     inguinal   POLYPECTOMY      TONSILLECTOMY     age 89     Current Outpatient Medications:    amitriptyline (ELAVIL) 10 MG tablet, Take 2 tablets (20 mg total) by mouth at bedtime., Disp: 60 tablet, Rfl: 0   amLODipine (NORVASC) 5 MG tablet, Take 1 tablet (5 mg total) by mouth daily., Disp: 90 tablet, Rfl: 3   ASPIRIN LOW DOSE 81 MG EC tablet, TAKE 1 TABLET BY MOUTH ONCE DAILY, Disp: 90 tablet, Rfl: 1   ezetimibe (ZETIA) 10 MG tablet, Take 1 tablet (10 mg total) by mouth daily., Disp: 90 tablet, Rfl: 3   gabapentin (NEURONTIN) 300 MG capsule, Take 300 mg by mouth daily., Disp: , Rfl:    gabapentin (NEURONTIN) 600 MG tablet, TAKE 1 TABLET(600 MG) BY MOUTH THREE TIMES DAILY, Disp: 270 tablet, Rfl: 0   lisinopril-hydrochlorothiazide (ZESTORETIC) 20-12.5 MG tablet, Take 2 tablets by mouth daily., Disp: 180 tablet, Rfl: 3   metoprolol succinate (TOPROL-XL) 25 MG 24 hr tablet, TAKE 1 TABLET(25 MG) BY MOUTH DAILY, Disp: 90 tablet, Rfl: 3   mirabegron ER (MYRBETRIQ) 25 MG TB24 tablet, Take 1 tablet (25 mg total) by mouth daily. Try around 7 pm. Pt has elevated liver enzymes not able to do 50 mg which insurance states will approve, Disp: 30 tablet, Rfl: 0   rOPINIRole (REQUIP) 2 MG tablet, TAKE 3 TABLETS(6 MG) BY MOUTH AT BEDTIME, Disp: 270 tablet, Rfl: 1    Family History  Problem Relation Age of Onset   Depression Mother    Hypertension Father    Heart disease Father    Stroke Father    Cancer Maternal Grandmother        ovarian?   Cancer Paternal Grandfather        prostate   Prostate cancer Paternal Grandfather    Colon cancer Neg Hx    Colon polyps Neg Hx      Social History   Tobacco Use   Smoking status: Never    Passive exposure: Never   Smokeless tobacco: Never  Vaping Use   Vaping Use: Never used  Substance Use Topics   Alcohol use: Yes    Alcohol/week: 4.0 standard drinks    Types: 4 Cans of beer per week    Comment: Alcoholic in the past   Drug use: No    Allergies as of 10/20/2021 - Review  Complete 10/20/2021  Allergen Reaction Noted   Nsaids Other (See Comments) 06/12/2013    Review of Systems:    All systems reviewed and negative except where noted in HPI.   Physical Exam:  BP 115/63 (BP Location: Left Arm, Patient Position: Sitting, Cuff Size: Large)    Pulse 75    Temp 98.9 F (37.2 C) (Oral)    Ht 5\' 6"  (1.676 m)    Wt (!) 321 lb 6 oz (145.8 kg)    BMI 51.87 kg/m  No  LMP for male patient.  General:   Alert,  Well-developed, well-nourished, pleasant and cooperative in NAD Head:  Normocephalic and atraumatic. Eyes:  Sclera clear, no icterus.   Conjunctiva pink. Ears:  Normal auditory acuity. Nose:  No deformity, discharge, or lesions. Mouth:  No deformity or lesions,oropharynx pink & moist. Neck:  Supple; no masses or thyromegaly. Lungs:  Respirations even and unlabored.  Clear throughout to auscultation.   No wheezes, crackles, or rhonchi. No acute distress. Heart:  Regular rate and rhythm; no murmurs, clicks, rubs, or gallops. Abdomen:  Normal bowel sounds. Soft, non-tender and non-distended without masses, hepatosplenomegaly or hernias noted.  No guarding or rebound tenderness.   Rectal: Not performed Msk:  Symmetrical without gross deformities. Good, equal movement & strength bilaterally. Pulses:  Normal pulses noted. Extremities:  No clubbing, 2+ edema.  No cyanosis. Neurologic:  Alert and oriented x3;  grossly normal neurologically. Skin:  Intact without significant lesions or rashes. No jaundice. Psych:  Alert and cooperative. Normal mood and affect.  Imaging Studies: Reviewed  Assessment and Plan:   Emmanual Gauthreaux is a 71 y.o. male with history of metabolic syndrome, morbid obesity BMI 52 is seen in consultation for elevated liver enzymes, fatty liver and personal history of tubular adenomas of the colon  Elevated liver enzymes: Most likely secondary to NASH from metabolic syndrome secondary liver disease work-up revealed elevated serum ferritin levels,  normal percent saturation.  Rule out hereditary hemochromatosis, ordered HFE gene test Reiterated on healthy diet Discussed about low-sodium diet, low-fat diet Discussed about tight control of diabetes Okay to start metformin 1000 mg daily Reiterated on complete abstinence from alcohol use Trial of amitriptyline 20 mg at bedtime to alleviate anxiety  Tubular adenomas of the colon Patient originally agreed to undergo surveillance colonoscopy.  However, both patient and his wife are concerned about neurologic side effects and with his underlying vascular dementia after the procedure.  He had prolonged recovery and memory problems after his previous colonoscopy in 2017.  I tried to inform the patient that since he had history of colon polyps, I strongly recommend colonoscopy.  However, he is reluctant to undergo colonoscopy.  Both patient and his wife wanted to know alternate tests, I discussed with them about CT colonography and Cologuard and he chose Cologuard test   Follow up in 4 months   Paul Darby, MD

## 2021-10-20 NOTE — Telephone Encounter (Signed)
Patient dropped off a handicapp renewal form to be completed. Form is up front in Dr Audrie Gallus color folder.

## 2021-10-20 NOTE — Telephone Encounter (Signed)
Form has already been completed and was set to be mailed. Called Patient's wife and she states she will come to pick this up tomorrow.   Placed upfront

## 2021-10-21 NOTE — Telephone Encounter (Signed)
Noted  

## 2021-10-22 LAB — HEMOCHROMATOSIS DNA-PCR(C282Y,H63D)

## 2021-10-26 DIAGNOSIS — M9901 Segmental and somatic dysfunction of cervical region: Secondary | ICD-10-CM | POA: Diagnosis not present

## 2021-10-26 DIAGNOSIS — M9906 Segmental and somatic dysfunction of lower extremity: Secondary | ICD-10-CM | POA: Diagnosis not present

## 2021-10-26 DIAGNOSIS — M9902 Segmental and somatic dysfunction of thoracic region: Secondary | ICD-10-CM | POA: Diagnosis not present

## 2021-10-26 DIAGNOSIS — M9903 Segmental and somatic dysfunction of lumbar region: Secondary | ICD-10-CM | POA: Diagnosis not present

## 2021-11-01 DIAGNOSIS — M9901 Segmental and somatic dysfunction of cervical region: Secondary | ICD-10-CM | POA: Diagnosis not present

## 2021-11-01 DIAGNOSIS — M9902 Segmental and somatic dysfunction of thoracic region: Secondary | ICD-10-CM | POA: Diagnosis not present

## 2021-11-01 DIAGNOSIS — M9903 Segmental and somatic dysfunction of lumbar region: Secondary | ICD-10-CM | POA: Diagnosis not present

## 2021-11-01 DIAGNOSIS — M9906 Segmental and somatic dysfunction of lower extremity: Secondary | ICD-10-CM | POA: Diagnosis not present

## 2021-11-03 DIAGNOSIS — M9903 Segmental and somatic dysfunction of lumbar region: Secondary | ICD-10-CM | POA: Diagnosis not present

## 2021-11-03 DIAGNOSIS — M9906 Segmental and somatic dysfunction of lower extremity: Secondary | ICD-10-CM | POA: Diagnosis not present

## 2021-11-03 DIAGNOSIS — M9902 Segmental and somatic dysfunction of thoracic region: Secondary | ICD-10-CM | POA: Diagnosis not present

## 2021-11-03 DIAGNOSIS — M9901 Segmental and somatic dysfunction of cervical region: Secondary | ICD-10-CM | POA: Diagnosis not present

## 2021-11-05 ENCOUNTER — Ambulatory Visit: Admit: 2021-11-05 | Payer: PPO | Admitting: Gastroenterology

## 2021-11-05 SURGERY — COLONOSCOPY WITH PROPOFOL
Anesthesia: General

## 2021-11-08 DIAGNOSIS — G4733 Obstructive sleep apnea (adult) (pediatric): Secondary | ICD-10-CM | POA: Diagnosis not present

## 2021-11-08 DIAGNOSIS — M9906 Segmental and somatic dysfunction of lower extremity: Secondary | ICD-10-CM | POA: Diagnosis not present

## 2021-11-08 DIAGNOSIS — E785 Hyperlipidemia, unspecified: Secondary | ICD-10-CM | POA: Diagnosis not present

## 2021-11-08 DIAGNOSIS — M9901 Segmental and somatic dysfunction of cervical region: Secondary | ICD-10-CM | POA: Diagnosis not present

## 2021-11-08 DIAGNOSIS — G2581 Restless legs syndrome: Secondary | ICD-10-CM | POA: Diagnosis not present

## 2021-11-08 DIAGNOSIS — M9903 Segmental and somatic dysfunction of lumbar region: Secondary | ICD-10-CM | POA: Diagnosis not present

## 2021-11-08 DIAGNOSIS — F1010 Alcohol abuse, uncomplicated: Secondary | ICD-10-CM | POA: Diagnosis not present

## 2021-11-08 DIAGNOSIS — M9902 Segmental and somatic dysfunction of thoracic region: Secondary | ICD-10-CM | POA: Diagnosis not present

## 2021-11-08 DIAGNOSIS — I10 Essential (primary) hypertension: Secondary | ICD-10-CM | POA: Diagnosis not present

## 2021-11-10 ENCOUNTER — Ambulatory Visit: Payer: PPO | Admitting: *Deleted

## 2021-11-10 DIAGNOSIS — F0153 Vascular dementia, unspecified severity, with mood disturbance: Secondary | ICD-10-CM

## 2021-11-10 DIAGNOSIS — I10 Essential (primary) hypertension: Secondary | ICD-10-CM

## 2021-11-10 DIAGNOSIS — E785 Hyperlipidemia, unspecified: Secondary | ICD-10-CM

## 2021-11-10 NOTE — Chronic Care Management (AMB) (Signed)
°  Care Management   Follow Up Note   11/10/2021 Name: Numa Schroeter MRN: [id number] DOB: [birth year]   Referred by: McLean-Scocuzza, [name], MD Reason for referral : Chronic Care Management (DEMENTIA, HTN)   Successful outreach to wife for follow up.  Wife answered and stated she was on another important call and request call back.  Follow Up Plan: The care management team will reach out to the patient again over the next 20 days.   Hubert Azure RN, MSN RN Care Management Coordinator [place] [phone] [email or web address]

## 2021-11-11 DIAGNOSIS — M9901 Segmental and somatic dysfunction of cervical region: Secondary | ICD-10-CM | POA: Diagnosis not present

## 2021-11-11 DIAGNOSIS — M9903 Segmental and somatic dysfunction of lumbar region: Secondary | ICD-10-CM | POA: Diagnosis not present

## 2021-11-11 DIAGNOSIS — M9902 Segmental and somatic dysfunction of thoracic region: Secondary | ICD-10-CM | POA: Diagnosis not present

## 2021-11-11 DIAGNOSIS — M9906 Segmental and somatic dysfunction of lower extremity: Secondary | ICD-10-CM | POA: Diagnosis not present

## 2021-11-12 ENCOUNTER — Other Ambulatory Visit: Payer: Self-pay | Admitting: Internal Medicine

## 2021-11-12 DIAGNOSIS — N3281 Overactive bladder: Secondary | ICD-10-CM

## 2021-11-15 ENCOUNTER — Ambulatory Visit (INDEPENDENT_AMBULATORY_CARE_PROVIDER_SITE_OTHER): Payer: PPO | Admitting: *Deleted

## 2021-11-15 DIAGNOSIS — M9902 Segmental and somatic dysfunction of thoracic region: Secondary | ICD-10-CM | POA: Diagnosis not present

## 2021-11-15 DIAGNOSIS — M9903 Segmental and somatic dysfunction of lumbar region: Secondary | ICD-10-CM | POA: Diagnosis not present

## 2021-11-15 DIAGNOSIS — M9901 Segmental and somatic dysfunction of cervical region: Secondary | ICD-10-CM | POA: Diagnosis not present

## 2021-11-15 DIAGNOSIS — F0153 Vascular dementia, unspecified severity, with mood disturbance: Secondary | ICD-10-CM

## 2021-11-15 DIAGNOSIS — I10 Essential (primary) hypertension: Secondary | ICD-10-CM

## 2021-11-15 DIAGNOSIS — M9906 Segmental and somatic dysfunction of lower extremity: Secondary | ICD-10-CM | POA: Diagnosis not present

## 2021-11-15 NOTE — Chronic Care Management (AMB) (Signed)
Chronic Care Management   CCM RN Visit Note  11/15/2021 Name: Rosbel Buckner MRN: [id number] DOB: 16-Apr-1951  Subjective: Paul Cook is a 71 y.o. year old male who is a primary care patient of McLean-Scocuzza, [name], MD. The care management team was consulted for assistance with disease management and care coordination needs.    The patient was given information about Chronic Care Management services, agreed to services, and gave verbal consent prior to initiation of services.  Please see initial visit note for detailed documentation.  for follow up visit in response to provider referral for case management and/or care coordination services.   Consent to Services:  The patient was given information about Chronic Care Management services, agreed to services, and gave verbal consent prior to initiation of services.  Please see initial visit note for detailed documentation.   Patient agreed to services and verbal consent obtained.   Assessment: Review of patient past medical history, allergies, medications, health status, including review of consultants reports, laboratory and other test data, was performed as part of comprehensive evaluation and provision of chronic care management services.   SDOH (Social Determinants of Health) assessments and interventions performed:    CCM Care Plan  Allergies  Allergen Reactions   Nsaids Other (See Comments)    GI Issues    Outpatient Encounter Medications as of 11/15/2021  Medication Sig Note   amitriptyline (ELAVIL) 10 MG tablet Take 2 tablets (20 mg total) by mouth at bedtime.    amLODipine (NORVASC) 5 MG tablet Take 1 tablet (5 mg total) by mouth daily.    ASPIRIN LOW DOSE 81 MG EC tablet TAKE 1 TABLET BY MOUTH ONCE DAILY    ezetimibe (ZETIA) 10 MG tablet Take 1 tablet (10 mg total) by mouth daily.    gabapentin (NEURONTIN) 300 MG capsule Take 300 mg by mouth daily.    gabapentin (NEURONTIN) 600 MG tablet TAKE 1 TABLET(600 MG) BY MOUTH THREE  TIMES DAILY 07/20/2021: 300 mg QPM   lisinopril-hydrochlorothiazide (ZESTORETIC) 20-12.5 MG tablet Take 2 tablets by mouth daily.    metoprolol succinate (TOPROL-XL) 25 MG 24 hr tablet TAKE 1 TABLET(25 MG) BY MOUTH DAILY    MYRBETRIQ 25 MG TB24 tablet TAKE 1 TABLET BY MOUTH ONCE DAILY AROUND7:00PM    rOPINIRole (REQUIP) 2 MG tablet TAKE 3 TABLETS(6 MG) BY MOUTH AT BEDTIME    No facility-administered encounter medications on file as of 11/15/2021.    Patient Active Problem List   Diagnosis Date Noted   Facet arthritis, degenerative, lumbar spine 10/12/2021   Posterior tibial tendinitis of right lower extremity 10/12/2021   LVH (left ventricular hypertrophy) 10/12/2021   Overactive bladder 10/12/2021   Daytime hypersomnolence 10/12/2021   Urine incontinence 07/28/2021   OCD (obsessive compulsive disorder) 07/04/2021   Elevated liver enzymes 07/04/2021   Vascular dementia with depressed mood 07/02/2021   DOE (dyspnea on exertion) 08/06/2020   Abnormal EKG 08/06/2020   Coronary artery calcification seen on CT scan 07/13/2020   Coronary atherosclerosis due to calcified coronary lesion 06/10/2020   Aortic atherosclerosis ([place]) 06/10/2020   Lung nodule seen on imaging study 06/10/2020   Cyst of left kidney 05/29/2020   Gallstone 05/29/2020   Fatty liver 05/29/2020   Bilateral leg edema 05/15/2020   Venous stasis dermatitis of right lower extremity 05/15/2020   Right ankle swelling 12/17/2019   HLD (hyperlipidemia) 11/09/2018   History of alcohol abuse 09/06/2018   Chronic pain of right ankle 09/06/2018   Vitamin D deficiency 06/29/2018  Prediabetes 06/28/2018   Plantar fasciitis 08/29/2016   GERD (gastroesophageal reflux disease) 12/18/2015   Restless legs 03/19/2015   Essential hypertension 01/14/2014   Obstructive sleep apnea 12/17/2013   IBS (irritable bowel syndrome) 07/30/2013   Chronic insomnia 06/12/2013   Cervical disc disorder with radiculopathy of cervical region  06/12/2013   Morbid obesity with BMI of 50.0-59.9, adult ([place]) 06/12/2013   Seasonal allergies 06/12/2013   Anxiety and depression 06/12/2013    Conditions to be addressed/monitored:HTN and Dementia  Care Plan : [place]  Updates made by Leona Singleton, RN since 11/15/2021 12:00 AM     Problem: Increasing forgetfulness related to dementia causing dificulties with self cae management of hypertension   Priority: Medium     Long-Range Goal: Patient and wife will work with CCM team to assist in learning more about dementia to help self care management of cormobidities   Start Date: 07/09/2021  Expected End Date: 02/13/2022  Recent Progress: Not on track  Priority: Medium  Note:   Current Barriers:  Knowledge Deficits related to plan of care for management of HTN and Dementia  Care Coordination needs related to Cognitive Deficits, Memory Deficits, and assistance arranging GI appointment  Cognitive Deficits;   History of hypertension, monitors blood pressures at home occasionally.    Spoke with wife, not patient.  Wife continues to be home with patient.  States patient is about the same, maybe a little better now that she is home to make sure he is getting his medications.  States his alcohol intake is less but he sometimes is still able to sneak it in.  Wife wants to visit kids out of town but is afraid to leave patient alone (encouraged to discuss with other family).  Has had GI eval with coloscopy and endoscopy.  Just completed sleep study this morning.  Reports blood pressures at home have been good, 120-130/70-80. Wife is asking for Handicap Placard for patient as his mobility is limited due to weight and knees.  1/30--Patient continues to be about the same per wife.  Continues to be increasingly confused.  Encouraged daily routine activities.  Wife states patient has completed sleep study but having a hard time obtaining CPAP machine.  RNCM Clinical Goal(s):  Patient will  verbalize understanding of plan for management of HTN and Dementia  take all medications exactly as prescribed and will call provider for medication related questions continue to work with RN Care Manager to address care management and care coordination needs related to HTN and Dementia  work with Education officer, museum to address [place] Concerns , Cognitive Deficits, and Memory Deficits related to the management of Dementia through collaboration with RN Care manager, provider, and care team.  Wife will report monitoring blood pressure at home weekly within the next 45 days.  Interventions: 1:1 collaboration with primary care provider regarding development and update of comprehensive plan of care as evidenced by provider attestation and co-signature Inter-disciplinary care team collaboration (see longitudinal plan of care) Evaluation of current treatment plan related to  self management and patient's adherence to plan as established by provider Attend appointment with GI 08/31/21 Encouraged no alcohol  Dementia  (Status: Goal on track: NO.) Evaluation of current treatment plan related to Dementia, Limited education about Dementia*, Cognitive Deficits, and Memory Deficits self-management and patient's adherence to plan as established by provider. Provided education to patient re: Dementia; Discussed plans with patient for ongoing care management follow up and provided patient with direct contact  information for care management team; Advised patient to discuss diagnosis and medications with provider; Assessed social determinant of health barriers;  Follow up with Neurologist to discuss OT referral  Discussed with wife patient should not be drinking and driving; encouraged wife to discuss driving with patient and Neurologist Discussed dangers of alcohol with dementia and driving and encouraged wife to discuss with providers Discussed Handicap sticker and encouraged wife to request form from  PCP RNCM contacted [facility] to verify CPAP, awaiting call back from agency to confirm  where/when  CPAP  Hypertension: (Status: Goal on Track (progressing): YES.) Last practice recorded BP readings:  BP Readings from Last 3 Encounters:  08/31/21 125/79  08/17/21 120/72  07/27/21 118/70  Most recent eGFR/CrCl: No results found for: EGFR  No components found for: CRCL  Evaluation of current treatment plan related to hypertension self management and patient's adherence to plan as established by provider;   Reviewed medications with patient and discussed importance of compliance;  Advised patient, providing education and rationale, to monitor blood pressure daily and record, calling PCP for findings outside established parameters;  Discussed complications of poorly controlled blood pressure such as heart disease, stroke, circulatory complications, vision complications, kidney impairment, sexual dysfunction;  Encouraged to continue to take and record blood pressures at least weekly for provider review  Discussed low salt heart healthy diet  Patient Goals/Self-Care Activities: Patient will attend all scheduled provider appointments as evidenced by clinician review of documented attendance to scheduled appointments and patient/caregiver report Patient will continue to perform ADL's independently as evidenced by patient/caregiver report Patient will call provider office for new concerns or questions as evidenced by review of documented incoming telephone call notes and patient report Check blood pressure weekly Write blood pressure results in a log for provider review Keep medicine where it is safe or locked Do not drive; do not drink and drive Use appliances that have an auto shut-off feature  Keep patient active during the day; do more crossword puzzle Limit naps throughout the day Do one enjoyable thing every day; laugh; watch a funny movie or comedian Eat healthy; do not eat or exercise right  before bedtime Get outdoors every day (weather permitting) Spend time or talk with others at least 2 to 3 times per week       Plan:The care management team will reach out to the patient again over the next 30 days.  Hubert Azure RN, MSN RN Care Management Coordinator [place] [phone] .[email or web address]

## 2021-11-15 NOTE — Patient Instructions (Signed)
Visit Information  Thank you for taking time to visit with me today. Please don't hesitate to contact me if I can be of assistance to you before our next scheduled telephone appointment.  Following are the goals we discussed today:  (Copy and paste patient goals from clinical care plan here)  Our next appointment is by telephone on 2/24 at 1000  Please call the care guide team at [phone] if you need to cancel or reschedule your appointment.   If you are experiencing a Mental Health or [place] or need someone to talk to, please call the Suicide and Crisis Lifeline: 988 call the [place] National Suicide Prevention Lifeline: 925-826-7622 or TTY: 956-399-2764 TTY (514)215-6665) to talk to a trained counselor call 1-800-273-TALK (toll free, 24 hour hotline) call 911   The patient verbalized understanding of instructions, educational materials, and care plan provided today and declined offer to receive copy of patient instructions, educational materials, and care plan.   Hubert Azure RN, MSN RN Care Management Coordinator [place] [phone] [email or web address]

## 2021-11-16 ENCOUNTER — Other Ambulatory Visit: Payer: Self-pay | Admitting: Gastroenterology

## 2021-11-16 DIAGNOSIS — F411 Generalized anxiety disorder: Secondary | ICD-10-CM

## 2021-11-16 DIAGNOSIS — I10 Essential (primary) hypertension: Secondary | ICD-10-CM | POA: Diagnosis not present

## 2021-11-16 DIAGNOSIS — R0681 Apnea, not elsewhere classified: Secondary | ICD-10-CM | POA: Diagnosis not present

## 2021-11-16 DIAGNOSIS — G4700 Insomnia, unspecified: Secondary | ICD-10-CM | POA: Diagnosis not present

## 2021-11-16 DIAGNOSIS — E785 Hyperlipidemia, unspecified: Secondary | ICD-10-CM | POA: Diagnosis not present

## 2021-11-16 DIAGNOSIS — F0153 Vascular dementia, unspecified severity, with mood disturbance: Secondary | ICD-10-CM | POA: Diagnosis not present

## 2021-11-16 DIAGNOSIS — G4733 Obstructive sleep apnea (adult) (pediatric): Secondary | ICD-10-CM | POA: Diagnosis not present

## 2021-11-16 DIAGNOSIS — R2681 Unsteadiness on feet: Secondary | ICD-10-CM | POA: Diagnosis not present

## 2021-11-16 DIAGNOSIS — F32A Depression, unspecified: Secondary | ICD-10-CM | POA: Diagnosis not present

## 2021-11-16 DIAGNOSIS — M5010 Cervical disc disorder with radiculopathy, unspecified cervical region: Secondary | ICD-10-CM | POA: Diagnosis not present

## 2021-11-17 ENCOUNTER — Institutional Professional Consult (permissible substitution): Payer: PPO | Admitting: Pulmonary Disease

## 2021-11-17 DIAGNOSIS — M5010 Cervical disc disorder with radiculopathy, unspecified cervical region: Secondary | ICD-10-CM | POA: Diagnosis not present

## 2021-11-17 DIAGNOSIS — R2681 Unsteadiness on feet: Secondary | ICD-10-CM | POA: Diagnosis not present

## 2021-11-19 DIAGNOSIS — M419 Scoliosis, unspecified: Secondary | ICD-10-CM | POA: Diagnosis not present

## 2021-11-19 DIAGNOSIS — S32020A Wedge compression fracture of second lumbar vertebra, initial encounter for closed fracture: Secondary | ICD-10-CM | POA: Diagnosis not present

## 2021-11-19 DIAGNOSIS — I700 Atherosclerosis of aorta: Secondary | ICD-10-CM | POA: Diagnosis not present

## 2021-11-19 DIAGNOSIS — M4316 Spondylolisthesis, lumbar region: Secondary | ICD-10-CM | POA: Diagnosis not present

## 2021-11-19 DIAGNOSIS — M545 Low back pain, unspecified: Secondary | ICD-10-CM | POA: Diagnosis not present

## 2021-11-19 DIAGNOSIS — M47816 Spondylosis without myelopathy or radiculopathy, lumbar region: Secondary | ICD-10-CM | POA: Diagnosis not present

## 2021-11-19 DIAGNOSIS — M5136 Other intervertebral disc degeneration, lumbar region: Secondary | ICD-10-CM | POA: Diagnosis not present

## 2021-11-19 DIAGNOSIS — Z6841 Body Mass Index (BMI) 40.0 and over, adult: Secondary | ICD-10-CM | POA: Diagnosis not present

## 2021-11-19 DIAGNOSIS — G8929 Other chronic pain: Secondary | ICD-10-CM | POA: Diagnosis not present

## 2021-11-22 ENCOUNTER — Other Ambulatory Visit: Payer: Self-pay | Admitting: Sports Medicine

## 2021-11-22 DIAGNOSIS — S32020A Wedge compression fracture of second lumbar vertebra, initial encounter for closed fracture: Secondary | ICD-10-CM

## 2021-11-22 DIAGNOSIS — M4316 Spondylolisthesis, lumbar region: Secondary | ICD-10-CM

## 2021-11-22 DIAGNOSIS — M545 Low back pain, unspecified: Secondary | ICD-10-CM

## 2021-11-29 ENCOUNTER — Other Ambulatory Visit: Payer: Self-pay | Admitting: Internal Medicine

## 2021-11-29 DIAGNOSIS — I10 Essential (primary) hypertension: Secondary | ICD-10-CM

## 2021-12-10 ENCOUNTER — Ambulatory Visit: Payer: PPO | Admitting: *Deleted

## 2021-12-12 NOTE — Chronic Care Management (AMB) (Signed)
°  Care Management   Follow Up Note   12/12/2021 Name: Paul Cook MRN: [id number] DOB: [birth year]   Referred by: McLean-Scocuzza, [name], MD Reason for referral : Chronic Care Management (HTN, DEMENTIA)   Successful outreach to wife.  States she is on another important call and requesting callback another day and time.  Follow Up Plan: The care management team will reach out to the patient again over the next 20 PER WIFE'S REQUEST days.   Hubert Azure RN, MSN RN Care Management Coordinator [place] [phone] [email or web address]

## 2021-12-14 DIAGNOSIS — F32A Depression, unspecified: Secondary | ICD-10-CM | POA: Diagnosis not present

## 2021-12-14 DIAGNOSIS — I10 Essential (primary) hypertension: Secondary | ICD-10-CM | POA: Diagnosis not present

## 2021-12-14 DIAGNOSIS — G4733 Obstructive sleep apnea (adult) (pediatric): Secondary | ICD-10-CM | POA: Diagnosis not present

## 2021-12-14 DIAGNOSIS — G4700 Insomnia, unspecified: Secondary | ICD-10-CM | POA: Diagnosis not present

## 2021-12-14 DIAGNOSIS — R0681 Apnea, not elsewhere classified: Secondary | ICD-10-CM | POA: Diagnosis not present

## 2021-12-17 ENCOUNTER — Other Ambulatory Visit: Payer: Self-pay | Admitting: Gastroenterology

## 2021-12-17 DIAGNOSIS — F411 Generalized anxiety disorder: Secondary | ICD-10-CM

## 2021-12-17 DIAGNOSIS — G4700 Insomnia, unspecified: Secondary | ICD-10-CM | POA: Diagnosis not present

## 2021-12-17 DIAGNOSIS — G4733 Obstructive sleep apnea (adult) (pediatric): Secondary | ICD-10-CM | POA: Diagnosis not present

## 2021-12-17 DIAGNOSIS — I10 Essential (primary) hypertension: Secondary | ICD-10-CM | POA: Diagnosis not present

## 2021-12-21 ENCOUNTER — Telehealth: Payer: Self-pay | Admitting: Internal Medicine

## 2021-12-21 ENCOUNTER — Other Ambulatory Visit: Payer: Self-pay

## 2021-12-21 ENCOUNTER — Ambulatory Visit: Payer: PPO | Admitting: Gastroenterology

## 2021-12-21 ENCOUNTER — Other Ambulatory Visit: Payer: Self-pay | Admitting: Internal Medicine

## 2021-12-21 DIAGNOSIS — N3281 Overactive bladder: Secondary | ICD-10-CM

## 2021-12-21 MED ORDER — MIRABEGRON ER 25 MG PO TB24: ORAL_TABLET | ORAL | 0 refills | Status: DC

## 2021-12-21 MED ORDER — MIRABEGRON ER 25 MG PO TB24: ORAL_TABLET | ORAL | 3 refills | Status: DC

## 2021-12-21 NOTE — Telephone Encounter (Signed)
Medication sent in to preferred pharmacy per protocol.  ?Okay to send in more than a 30 supply with refills?  ?

## 2021-12-21 NOTE — Telephone Encounter (Signed)
Pharmacy is calling about refilling patient's MYRBETRIQ 25 MG TB24 tablet, [facility]. ?

## 2021-12-22 ENCOUNTER — Ambulatory Visit: Payer: Self-pay | Admitting: Pharmacist

## 2021-12-22 ENCOUNTER — Ambulatory Visit: Payer: PPO | Admitting: *Deleted

## 2021-12-22 DIAGNOSIS — I10 Essential (primary) hypertension: Secondary | ICD-10-CM

## 2021-12-22 DIAGNOSIS — R5383 Other fatigue: Secondary | ICD-10-CM

## 2021-12-22 DIAGNOSIS — F0153 Vascular dementia, unspecified severity, with mood disturbance: Secondary | ICD-10-CM

## 2021-12-22 DIAGNOSIS — F32A Depression, unspecified: Secondary | ICD-10-CM

## 2021-12-22 DIAGNOSIS — F419 Anxiety disorder, unspecified: Secondary | ICD-10-CM

## 2021-12-22 NOTE — Chronic Care Management (AMB) (Signed)
Care Management    RN Visit Note  12/22/2021 Name: Paul Cook MRN: [id number] DOB: [birth year]  Subjective: Paul Cook is a 71 y.o. year old male who is a primary care patient of McLean-Scocuzza, [name], MD. The care management team was consulted for assistance with disease management and care coordination needs.    Engaged with patient by telephone for follow up visit in response to provider referral for case management and/or care coordination services.   Consent to Services:   Paul Cook was given information about Care Management services today including:  Care Management services includes personalized support from designated clinical staff supervised by his physician, including individualized plan of care and coordination with other care providers 24/7 contact phone numbers for assistance for urgent and routine care needs. The patient may stop case management services at any time by phone call to the office staff.  Patient agreed to services and consent obtained.   Assessment: Review of patient past medical history, allergies, medications, health status, including review of consultants reports, laboratory and other test data, was performed as part of comprehensive evaluation and provision of chronic care management services.   SDOH (Social Determinants of Health) assessments and interventions performed:    Care Plan  Allergies  Allergen Reactions   Nsaids Other (See Comments)    GI Issues    Outpatient Encounter Medications as of 12/22/2021  Medication Sig Note   amitriptyline (ELAVIL) 10 MG tablet TAKE 2 TABLETS BY MOUTH AT BEDTIME    amLODipine (NORVASC) 5 MG tablet 1 tablet    ASPIRIN LOW DOSE 81 MG EC tablet TAKE 1 TABLET BY MOUTH ONCE DAILY    ezetimibe (ZETIA) 10 MG tablet Take 1 tablet (10 mg total) by mouth daily.    gabapentin (NEURONTIN) 300 MG capsule Take 300 mg by mouth daily.    gabapentin (NEURONTIN) 600 MG tablet TAKE 1 TABLET(600 MG) BY MOUTH THREE TIMES  DAILY 07/20/2021: 300 mg QPM   lisinopril-hydrochlorothiazide (ZESTORETIC) 20-12.5 MG tablet Take 2 tablets by mouth daily.    metoprolol succinate (TOPROL-XL) 25 MG 24 hr tablet TAKE 1 TABLET(25 MG) BY MOUTH DAILY    mirabegron ER (MYRBETRIQ) 25 MG TB24 tablet TAKE 1 TABLET BY MOUTH ONCE DAILY AROUND7:00PM    rOPINIRole (REQUIP) 2 MG tablet TAKE 3 TABLETS(6 MG) BY MOUTH AT BEDTIME    No facility-administered encounter medications on file as of 12/22/2021.    Patient Active Problem List   Diagnosis Date Noted   Facet arthritis, degenerative, lumbar spine 10/12/2021   Posterior tibial tendinitis of right lower extremity 10/12/2021   LVH (left ventricular hypertrophy) 10/12/2021   Overactive bladder 10/12/2021   Daytime hypersomnolence 10/12/2021   Urine incontinence 07/28/2021   OCD (obsessive compulsive disorder) 07/04/2021   Elevated liver enzymes 07/04/2021   Vascular dementia with depressed mood 07/02/2021   DOE (dyspnea on exertion) 08/06/2020   Abnormal EKG 08/06/2020   Coronary artery calcification seen on CT scan 07/13/2020   Coronary atherosclerosis due to calcified coronary lesion 06/10/2020   Aortic atherosclerosis ([place]) 06/10/2020   Lung nodule seen on imaging study 06/10/2020   Cyst of left kidney 05/29/2020   Gallstone 05/29/2020   Fatty liver 05/29/2020   Bilateral leg edema 05/15/2020   Venous stasis dermatitis of right lower extremity 05/15/2020   Right ankle swelling 12/17/2019   HLD (hyperlipidemia) 11/09/2018   History of alcohol abuse 09/06/2018   Chronic pain of right ankle 09/06/2018   Vitamin D deficiency 06/29/2018   Prediabetes  06/28/2018   Plantar fasciitis 08/29/2016   GERD (gastroesophageal reflux disease) 12/18/2015   Restless legs 03/19/2015   Essential hypertension 01/14/2014   Obstructive sleep apnea 12/17/2013   IBS (irritable bowel syndrome) 07/30/2013   Chronic insomnia 06/12/2013   Cervical disc disorder with radiculopathy of cervical  region 06/12/2013   Morbid obesity with BMI of 50.0-59.9, adult ([place]) 06/12/2013   Seasonal allergies 06/12/2013   Anxiety and depression 06/12/2013    Conditions to be addressed/monitored: HTN and Dementia  Care Plan : [place]  Updates made by Leona Singleton, RN since 12/22/2021 12:00 AM     Problem: Increasing forgetfulness related to dementia causing dificulties with self cae management of hypertension   Priority: Medium     Long-Range Goal: Patient and wife will work with CCM team to assist in learning more about dementia to help self care management of cormobidities   Start Date: 07/09/2021  Expected End Date: 02/13/2022  Recent Progress: Not on track  Priority: Medium  Note:   Current Barriers:  Knowledge Deficits related to plan of care for management of HTN and Dementia  Care Coordination needs related to Cognitive Deficits, Memory Deficits, and assistance arranging GI appointment  Cognitive Deficits;   History of hypertension, monitors blood pressures at home occasionally.    Spoke with wife, not patient.  Wife continues to be home with patient.  States patient is about the same, maybe a little better now that she is home to make sure he is getting his medications.  States his alcohol intake is less but he sometimes is still able to sneak it in.  Wife wants to visit kids out of town but is afraid to leave patient alone (encouraged to discuss with other family).  Has had GI eval with coloscopy and endoscopy.  Just completed sleep study this morning.  Reports blood pressures at home have been good, 120-130/70-80. Wife is asking for Handicap Placard for patient as his mobility is limited due to weight and knees.  1/30--Patient continues to be about the same per wife.  Continues to be increasingly confused.  Encouraged daily routine activities.  Wife states patient has completed sleep study but having a hard time obtaining CPAP machine.  3/8--wife reports patient doing a  little better.  Has obtained his CPAP and is sleeping better.  States he is now going to [place] to help  RNCM Clinical Goal(s):  Patient will verbalize understanding of plan for management of HTN and Dementia  take all medications exactly as prescribed and will call provider for medication related questions continue to work with [place] to address care management and care coordination needs related to HTN and Dementia  work with Education officer, museum to address [place] Concerns , Cognitive Deficits, and Memory Deficits related to the management of Dementia through collaboration with RN Care manager, provider, and care team.  Wife will report monitoring blood pressure at home weekly within the next 45 days.  Interventions: 1:1 collaboration with primary care provider regarding development and update of comprehensive plan of care as evidenced by provider attestation and co-signature Inter-disciplinary care team collaboration (see longitudinal plan of care) Evaluation of current treatment plan related to  self management and patient's adherence to plan as established by provider Encouraged no alcohol and continue attend AA  Dementia  (Status: Goal on Track (progressing): YES.) Evaluation of current treatment plan related to Dementia, Limited education about Dementia*, Cognitive Deficits, and Memory Deficits self-management and patient's adherence to  plan as established by provider. Provided education to patient re: Dementia; Discussed plans with patient for ongoing care management follow up and provided patient with direct contact information for care management team; Advised patient to discuss diagnosis and medications with provider; Assessed social determinant of health barriers;  Follow up with Neurologist to discuss OT referral  Discussed with wife patient should not be drinking and driving; encouraged wife to discuss driving with patient and Neurologist Discussed dangers of alcohol with  dementia and driving and encouraged wife to discuss with providers Discussed Handicap sticker and encouraged wife to request form from PCP [facility] confirmed patient has CPAP and wearing  Hypertension: (Status: Goal on Track (progressing): YES.) Last practice recorded BP readings:  BP Readings from Last 3 Encounters:  10/20/21 115/63  10/12/21 130/70  08/31/21 125/79  Most recent eGFR/CrCl: No results found for: EGFR  No components found for: CRCL  Evaluation of current treatment plan related to hypertension self management and patient's adherence to plan as established by provider;   Reviewed medications with patient and discussed importance of compliance;  Advised patient, providing education and rationale, to monitor blood pressure daily and record, calling PCP for findings outside established parameters;  Discussed complications of poorly controlled blood pressure such as heart disease, stroke, circulatory complications, vision complications, kidney impairment, sexual dysfunction;  Encouraged to continue to take and record blood pressures at least weekly for provider review  Discussed low salt heart healthy diet  Patient Goals/Self-Care Activities: Patient will attend all scheduled provider appointments as evidenced by clinician review of documented attendance to scheduled appointments and patient/caregiver report Patient will continue to perform ADL's independently as evidenced by patient/caregiver report Patient will call provider office for new concerns or questions as evidenced by review of documented incoming telephone call notes and patient report Check blood pressure weekly Write blood pressure results in a log for provider review Keep medicine where it is safe or locked Do not drive; do not drink and drive Use appliances that have an auto shut-off feature  Keep patient active during the day; do more crossword puzzle Limit naps throughout the day Do one enjoyable thing every day;  laugh; watch a funny movie or comedian Eat healthy; do not eat or exercise right before bedtime Get outdoors every day (weather permitting) Spend time or talk with others at least 2 to 3 times per week Continue with AA and wearing CPAP       Plan: The care management team will reach out to the patient again over the next 45 days.  Hubert Azure RN, MSN RN Care Management Coordinator [place] [phone] .[email or web address]

## 2021-12-22 NOTE — Patient Instructions (Addendum)
Visit Information ? ?Thank you for taking time to visit with me today. Please don't hesitate to contact me if I can be of assistance to you before our next scheduled telephone appointment. ? ?Following are the goals we discussed today:  ?Patient will attend all scheduled provider appointments as evidenced by clinician review of documented attendance to scheduled appointments and patient/caregiver report ?Patient will continue to perform ADL's independently as evidenced by patient/caregiver report ?Patient will call provider office for new concerns or questions as evidenced by review of documented incoming telephone call notes and patient report ?Check blood pressure weekly ?Write blood pressure results in a log for provider review ?Keep medicine where it is safe or locked ?Do not drive; do not drink and drive ?Use appliances that have an auto shut-off feature  ?Keep patient active during the day; do more crossword puzzle ?Limit naps throughout the day ?Do one enjoyable thing every day; laugh; watch a funny movie or comedian ?Eat healthy; do not eat or exercise right before bedtime ?Get outdoors every day (weather permitting) ?Spend time or talk with others at least 2 to 3 times per week ?Continue with AA and wearing CPAP ? ?Our next appointment is by telephone on 4/19 at 1030 ? ?Please call the care guide team at [phone] if you need to cancel or reschedule your appointment.  ? ?If you are experiencing a Mental Health or [place] or need someone to talk to, please call the Suicide and Crisis Lifeline: 988 ?call the [place] National Suicide Prevention Lifeline: 6144551347 or TTY: 478-813-7235 TTY 724 342 8226) to talk to a trained counselor ?call 1-800-273-TALK (toll free, 24 hour hotline) ?call 911  ? ?The patient verbalized understanding of instructions, educational materials, and care plan provided today and declined offer to receive copy of patient instructions, educational materials, and  care plan.  ? ?The care management team will reach out to the patient again over the next 45 days.  ? ?Hubert Azure RN, MSN ?RN Care Management Coordinator ?[place] ?[phone] ?[email or web address] ? ?

## 2021-12-22 NOTE — Chronic Care Management (AMB) (Signed)
?  Chronic Care Management  ? ?Note ? ?12/22/2021 ?Name: Bay Wayson MRN: [id number] DOB: 1950-12-15 ? ? ? ?Closing pharmacy CCM case at this time.  Patient has clinic contact information for future questions or concerns.  ? ?Catie Darnelle Maffucci, PharmD, [place], CPP ?Clinical Pharmacist ?Therapist, music at [facility] [employer] ?[phone] ? ?

## 2021-12-28 ENCOUNTER — Telehealth: Payer: Self-pay | Admitting: Gastroenterology

## 2021-12-28 NOTE — Telephone Encounter (Signed)
Patient left vm complaining of a $50 fine for a no show on his appt. Patient states that he called 2 days before his appt to cancel it. Requesting call back. ?

## 2021-12-29 ENCOUNTER — Ambulatory Visit: Payer: PPO | Admitting: Gastroenterology

## 2022-01-12 NOTE — Telephone Encounter (Signed)
Reviewed pt's account and confirmed he was not charged a $50 copay. ?

## 2022-01-13 ENCOUNTER — Telehealth: Payer: Self-pay | Admitting: Internal Medicine

## 2022-01-13 ENCOUNTER — Other Ambulatory Visit: Payer: Self-pay | Admitting: Internal Medicine

## 2022-01-13 DIAGNOSIS — G2581 Restless legs syndrome: Secondary | ICD-10-CM

## 2022-01-13 MED ORDER — GABAPENTIN 300 MG PO CAPS: 300.0000 mg | ORAL_CAPSULE | Freq: Every day | ORAL | 3 refills | Status: DC

## 2022-01-13 NOTE — Telephone Encounter (Signed)
What dose of gabapentin is he taking before I send into [facility]?  ? ?

## 2022-01-13 NOTE — Telephone Encounter (Signed)
Pt called in needing a refill on gabapentin and ambien sent to [facility]  ?

## 2022-01-13 NOTE — Telephone Encounter (Signed)
Again rec he schedule psychiatry referral for sleep mood and anxiety  ?Sent gabapentin ? ?Call he can have virtual appt with psychiatry  ? ?[facility] as given the info before for both  ?[facility] [place]  ?[place] ?[phone]  ?  ?[facility] [place]  ?[id number]  ?[place] [address]  ?[phone]  ?

## 2022-01-13 NOTE — Telephone Encounter (Signed)
Ambien last sent in by you in 2019. Please advise   ?

## 2022-01-14 DIAGNOSIS — G4700 Insomnia, unspecified: Secondary | ICD-10-CM | POA: Diagnosis not present

## 2022-01-14 DIAGNOSIS — R0681 Apnea, not elsewhere classified: Secondary | ICD-10-CM | POA: Diagnosis not present

## 2022-01-14 DIAGNOSIS — F32A Depression, unspecified: Secondary | ICD-10-CM | POA: Diagnosis not present

## 2022-01-14 DIAGNOSIS — I10 Essential (primary) hypertension: Secondary | ICD-10-CM | POA: Diagnosis not present

## 2022-01-14 DIAGNOSIS — G4733 Obstructive sleep apnea (adult) (pediatric): Secondary | ICD-10-CM | POA: Diagnosis not present

## 2022-01-14 NOTE — Telephone Encounter (Signed)
Patient wife informed and verbalized understanding

## 2022-01-14 NOTE — Telephone Encounter (Signed)
McLean-Scocuzza, [name], MD 21 hours ago (1:38 PM)  ? ?TM ?What dose of gabapentin is he taking before I send into [facility]?   ?  ? ?

## 2022-01-14 NOTE — Telephone Encounter (Signed)
Added message to other 01/13/22 telephone encounter  ?

## 2022-01-16 LAB — EXTERNAL GENERIC LAB PROCEDURE

## 2022-01-16 LAB — COLOGUARD

## 2022-01-17 ENCOUNTER — Other Ambulatory Visit: Payer: Self-pay | Admitting: Gastroenterology

## 2022-01-17 DIAGNOSIS — F411 Generalized anxiety disorder: Secondary | ICD-10-CM

## 2022-01-17 NOTE — Telephone Encounter (Signed)
He  is already on gabapentin which works very similar to amitriptyline.  will have to wait till Dr. Marius Ditch comes back

## 2022-01-17 NOTE — Telephone Encounter (Signed)
Trial of amitriptyline 20 mg at bedtime to alleviate anxiety Last office visit. Do not know if patient needs to take daily  ?

## 2022-02-02 ENCOUNTER — Ambulatory Visit (INDEPENDENT_AMBULATORY_CARE_PROVIDER_SITE_OTHER): Payer: PPO | Admitting: *Deleted

## 2022-02-02 DIAGNOSIS — F0153 Vascular dementia, unspecified severity, with mood disturbance: Secondary | ICD-10-CM

## 2022-02-02 DIAGNOSIS — I10 Essential (primary) hypertension: Secondary | ICD-10-CM
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
## 2022-02-02 NOTE — Patient Instructions (Addendum)
Visit Information ? ?Thank you for taking time to visit with me today. Please don't hesitate to contact me if I can be of assistance to you before our next scheduled telephone appointment. ? ?Following are the goals we discussed today:  ?Patient will attend all scheduled provider appointments as evidenced by clinician review of documented attendance to scheduled appointments and patient/caregiver report ?Patient will continue to perform ADL's independently as evidenced by patient/caregiver report ?Patient will call provider office for new concerns or questions as evidenced by review of documented incoming telephone call notes and patient report ?Check blood pressure weekly ?Write blood pressure results in a log for provider review ?Keep medicine where it is safe or locked ?Do not drive; do not drink and drive ?Use appliances that have an auto shut-off feature  ?Keep patient active during the day; do more crossword puzzle ?Limit naps throughout the day ?Do one enjoyable thing every day; laugh; watch a funny movie or comedian ?Eat healthy; do not eat or exercise right before bedtime ?Get outdoors every day (weather permitting) ?Spend time or talk with others at least 2 to 3 times per week ?Continue with AA and wearing CPAP ?Work with CSW for Adult Day Care options and other resources for patient ? ?Our next appointment is by telephone on 5/17 at 1045 ? ?Please call the care guide team at [phone] if you need to cancel or reschedule your appointment.  ? ?If you are experiencing a Mental Health or [place] or need someone to talk to, please call the Suicide and Crisis Lifeline: 988 ?call the [place] National Suicide Prevention Lifeline: 470-878-4289 or TTY: 747-525-7052 TTY 279-577-8436) to talk to a trained counselor ?call 1-800-273-TALK (toll free, 24 hour hotline) ?call 911  ? ?The patient verbalized understanding of instructions, educational materials, and care plan provided today and declined  offer to receive copy of patient instructions, educational materials, and care plan.  ? ?Hubert Azure RN, MSN ?RN Care Management Coordinator ?[place] ?[phone] ?[email or web address] ? ?

## 2022-02-03 ENCOUNTER — Telehealth: Payer: Self-pay | Admitting: *Deleted

## 2022-02-03 NOTE — Telephone Encounter (Addendum)
?  Care Management  ? ?Follow Up Note ? ? ?02/03/2022 ?Name: Paul Cook MRN: [id number] DOB: 09-10-1951 ? ? ?Referred by: McLean-Scocuzza, [name], MD ?Reason for referral : Care Coordination ? ? ?An unsuccessful telephone outreach was attempted today. The patient was referred to the case management team for assistance with care management and care coordination.  ? ?Follow Up Plan: Telephone follow up appointment with care management team member scheduled for: 02/09/22 ? ? , LCSW ?[place] ?[phone] ? ? ?

## 2022-02-08 ENCOUNTER — Encounter: Payer: Self-pay | Admitting: Gastroenterology

## 2022-02-09 ENCOUNTER — Ambulatory Visit: Payer: PPO | Admitting: *Deleted

## 2022-02-09 DIAGNOSIS — I10 Essential (primary) hypertension: Secondary | ICD-10-CM

## 2022-02-09 DIAGNOSIS — F0153 Vascular dementia, unspecified severity, with mood disturbance: Secondary | ICD-10-CM

## 2022-02-09 NOTE — Patient Instructions (Signed)
Visit Information ? ?Thank you for taking time to visit with me today. Please don't hesitate to contact me if I can be of assistance to you before our next scheduled telephone appointment. ? ?Following are the goals we discussed today:  ?Patient's spouse to contact [facility] to explore participation options ? ?Our next appointment is by telephone on 02/23/22 at 10am ? ?Please call the care guide team at [phone] if you need to cancel or reschedule your appointment.  ? ?If you are experiencing a Mental Health or [place] or need someone to talk to, please call the Suicide and Crisis Lifeline: 988  ? ?Following is a copy of your full plan of care:  ?Care Plan : General Social Work (Adult)  ?Updates made by Vern Claude, LCSW since 02/09/2022 12:00 AM  ?  ? ?Problem: CHL AMB "PATIENT-SPECIFIC PROBLEM"   ?Note:   ?CARE PLAN ENTRY ?(see longitudinal plan of care for additional care plan information) ? ?Current Barriers:  ?Patient with Dementia in need of assistance with connection to community resources  ?Knowledge deficits and need for support, education and care coordination related to community resources support  ?Memory Deficits ? ?Clinical Goal(s)  ?Over the next 90 days, patient's spouse will work with care management team member to address concerns related to respite and additional caregiver support ? ?Interventions provided by LCSW:  ?Assessed patient's care coordination needs related to patient's identified community resource needs and discussed ongoing care management follow up  ?Safety concerns discussed, per patient's spouse she has removed patient's car keys from patient's reach due to episodes of drinking and losing is way while driving ?Patient's spouse requesting additional assistance with patient care due to limited family and social support ?Provided patient's spouse with information about the respite program through [facility] as well as [facility] ?Advised patient's spouse  to contact [facility] to schedule a tour for possible admission-contact information provided-spouse agreeable to contacting this program ?This Education officer, museum agreeable to referring patient to the respite care program through the [place] ?Collaborated with appropriate clinical care team members regarding patient needs ?Active listening / Reflection utilized  ?Emotional Support Provided ?Caregiver stress acknowledged   ? ?Patient Self Care Activities & Deficits:  ?Patient is unable to independently navigate community resource options without care coordination support  ?Acknowledges deficits and is motivated to resolve concern  ?Patient is able to [facility]  as discussed today ?Unable to independently due to memory deficits ?Attends all scheduled provider appointments ? ?Initial goal documentation ? ? ?  ? ? ?Mr. Cogbill was given information about Care Management services by the embedded care coordination team including:  ?Care Management services include personalized support from designated clinical staff supervised by his physician, including individualized plan of care and coordination with other care providers ?24/7 contact phone numbers for assistance for urgent and routine care needs. ?The patient may stop CCM services at any time (effective at the end of the month) by phone call to the office staff. ? ?Patient agreed to services and verbal consent obtained.  ? ?Patient verbalizes understanding of instructions and care plan provided today and agrees to view in [place]. Active MyChart status confirmed with patient.   ? ?Telephone follow up appointment with care management team member scheduled for: 02/23/22 ? ? , LCSW ?[place] ?[phone] ? ? ?  ?

## 2022-02-09 NOTE — Chronic Care Management (AMB) (Signed)
Care Management ?Clinical Social Work Note ? ?02/09/2022 ?Name: Paul Cook MRN: [id number] DOB: 03/25/1951 ? ?Paul Cook is a 71 y.o. year old male who is a primary care patient of McLean-Scocuzza, [name], MD.  The Care Management team was consulted for assistance with chronic disease management and coordination needs. ? ?Collaboration with patient's spouse  for initial visit in response to provider referral for social work chronic care management and care coordination services ? ?Consent to Services:  ?Paul Cook was given information about Care Management services today including:  ?Care Management services includes personalized support from designated clinical staff supervised by his physician, including individualized plan of care and coordination with other care providers ?24/7 contact phone numbers for assistance for urgent and routine care needs. ?The patient may stop case management services at any time by phone call to the office staff. ? ?Patient agreed to services and consent obtained.  ? ?Assessment: Review of patient past medical history, allergies, medications, and health status, including review of relevant consultants reports was performed today as part of a comprehensive evaluation and provision of chronic care management and care coordination services. ? ?SDOH (Social Determinants of Health) assessments and interventions performed:  ?SDOH Interventions   ? ?Flowsheet Row Most Recent Value  ?SDOH Interventions   ?SDOH Interventions for the Following Domains Social Connections  ?Social Connections Interventions Other (Comment)  [refer to the [facility] and [place] for respite care options]  ? ?  ?  ? ?Advanced Directives Status: Not addressed in this encounter. ? ?Care Plan ? ?Allergies  ?Allergen Reactions  ? Nsaids Other (See Comments)  ?  GI Issues  ? ? ?Outpatient Encounter Medications as of 02/09/2022  ?Medication Sig Note  ? amitriptyline (ELAVIL) 10 MG tablet TAKE  2 TABLETS BY MOUTH AT BEDTIME   ? amLODipine (NORVASC) 5 MG tablet 1 tablet   ? ASPIRIN LOW DOSE 81 MG EC tablet TAKE 1 TABLET BY MOUTH ONCE DAILY   ? ezetimibe (ZETIA) 10 MG tablet Take 1 tablet (10 mg total) by mouth daily.   ? gabapentin (NEURONTIN) 300 MG capsule Take 1 capsule (300 mg total) by mouth daily.   ? gabapentin (NEURONTIN) 600 MG tablet TAKE 1 TABLET(600 MG) BY MOUTH THREE TIMES DAILY 07/20/2021: 300 mg QPM  ? lisinopril-hydrochlorothiazide (ZESTORETIC) 20-12.5 MG tablet Take 2 tablets by mouth daily.   ? metoprolol succinate (TOPROL-XL) 25 MG 24 hr tablet TAKE 1 TABLET(25 MG) BY MOUTH DAILY   ? mirabegron ER (MYRBETRIQ) 25 MG TB24 tablet TAKE 1 TABLET BY MOUTH ONCE DAILY AROUND7:00PM   ? rOPINIRole (REQUIP) 2 MG tablet TAKE 3 TABLETS(6 MG) BY MOUTH AT BEDTIME   ? ?No facility-administered encounter medications on file as of 02/09/2022.  ? ? ?Patient Active Problem List  ? Diagnosis Date Noted  ? Facet arthritis, degenerative, lumbar spine 10/12/2021  ? Posterior tibial tendinitis of right lower extremity 10/12/2021  ? LVH (left ventricular hypertrophy) 10/12/2021  ? Overactive bladder 10/12/2021  ? Daytime hypersomnolence 10/12/2021  ? Urine incontinence 07/28/2021  ? OCD (obsessive compulsive disorder) 07/04/2021  ? Elevated liver enzymes 07/04/2021  ? Vascular dementia with depressed mood ([place]) 07/02/2021  ? DOE (dyspnea on exertion) 08/06/2020  ? Abnormal EKG 08/06/2020  ? Coronary artery calcification seen on CT scan 07/13/2020  ? Coronary atherosclerosis due to calcified coronary lesion 06/10/2020  ? Aortic atherosclerosis ([place]) 06/10/2020  ? Lung nodule seen on imaging study 06/10/2020  ? Cyst of left kidney 05/29/2020  ?  Gallstone 05/29/2020  ? Fatty liver 05/29/2020  ? Bilateral leg edema 05/15/2020  ? Venous stasis dermatitis of right lower extremity 05/15/2020  ? Right ankle swelling 12/17/2019  ? HLD (hyperlipidemia) 11/09/2018  ? History of alcohol abuse 09/06/2018  ? Chronic pain of right  ankle 09/06/2018  ? Vitamin D deficiency 06/29/2018  ? Prediabetes 06/28/2018  ? Plantar fasciitis 08/29/2016  ? GERD (gastroesophageal reflux disease) 12/18/2015  ? Restless legs 03/19/2015  ? Essential hypertension 01/14/2014  ? Obstructive sleep apnea 12/17/2013  ? IBS (irritable bowel syndrome) 07/30/2013  ? Chronic insomnia 06/12/2013  ? Cervical disc disorder with radiculopathy of cervical region 06/12/2013  ? Morbid obesity with BMI of 50.0-59.9, adult ([place]) 06/12/2013  ? Seasonal allergies 06/12/2013  ? Anxiety and depression 06/12/2013  ? ? ?Conditions to be addressed/monitored: Dementia; Memory Deficits ? ?Care Plan : General Social Work (Adult)  ?Updates made by Vern Claude, LCSW since 02/09/2022 12:00 AM  ?  ? ?Problem: CHL AMB "PATIENT-SPECIFIC PROBLEM"   ?Note:   ?CARE PLAN ENTRY ?(see longitudinal plan of care for additional care plan information) ? ?Current Barriers:  ?Patient with Dementia in need of assistance with connection to community resources  ?Knowledge deficits and need for support, education and care coordination related to community resources support  ?Memory Deficits ? ?Clinical Goal(s)  ?Over the next 90 days, patient's spouse will work with care management team member to address concerns related to respite and additional caregiver support ? ?Interventions provided by LCSW:  ?Assessed patient's care coordination needs related to patient's identified community resource needs and discussed ongoing care management follow up  ?Safety concerns discussed, per patient's spouse she has removed patient's car keys from patient's reach due to episodes of drinking and losing is way while driving ?Patient's spouse requesting additional assistance with patient care due to limited family and social support ?Provided patient's spouse with information about the respite program through [facility] as well as [facility] ?Advised patient's spouse  to contact [facility] to schedule a tour for possible admission-contact information provided-spouse agreeable to contacting this program ?This Education officer, museum agreeable to referring patient to the respite care program through the [place] ?Collaborated with appropriate clinical care team members regarding patient needs ?Active listening / Reflection utilized  ?Emotional Support Provided ?Caregiver stress acknowledged   ? ?Patient Self Care Activities & Deficits:  ?Patient is unable to independently navigate community resource options without care coordination support  ?Acknowledges deficits and is motivated to resolve concern  ?Patient is able to [facility]  as discussed today ?Unable to independently due to memory deficits ?Attends all scheduled provider appointments ? ?Initial goal documentation ? ? ?  ?  ? ?Follow Up Plan: SW will follow up with patient by phone over the next 14 business days ? ? , LCSW ?[place] ?[phone] ? ?

## 2022-02-13 DIAGNOSIS — F0153 Vascular dementia, unspecified severity, with mood disturbance: Secondary | ICD-10-CM | POA: Diagnosis not present

## 2022-02-13 DIAGNOSIS — R0681 Apnea, not elsewhere classified: Secondary | ICD-10-CM | POA: Diagnosis not present

## 2022-02-13 DIAGNOSIS — F32A Depression, unspecified: Secondary | ICD-10-CM | POA: Diagnosis not present

## 2022-02-13 DIAGNOSIS — I10 Essential (primary) hypertension: Secondary | ICD-10-CM | POA: Diagnosis not present

## 2022-02-13 DIAGNOSIS — G4700 Insomnia, unspecified: Secondary | ICD-10-CM | POA: Diagnosis not present

## 2022-02-13 DIAGNOSIS — G4733 Obstructive sleep apnea (adult) (pediatric): Secondary | ICD-10-CM | POA: Diagnosis not present

## 2022-02-21 ENCOUNTER — Other Ambulatory Visit: Payer: Self-pay | Admitting: Gastroenterology

## 2022-02-21 DIAGNOSIS — F411 Generalized anxiety disorder: Secondary | ICD-10-CM

## 2022-02-23 ENCOUNTER — Ambulatory Visit (INDEPENDENT_AMBULATORY_CARE_PROVIDER_SITE_OTHER): Payer: PPO | Admitting: *Deleted

## 2022-02-23 DIAGNOSIS — I10 Essential (primary) hypertension: Secondary | ICD-10-CM

## 2022-02-23 DIAGNOSIS — F0153 Vascular dementia, unspecified severity, with mood disturbance: Secondary | ICD-10-CM

## 2022-02-23 NOTE — Chronic Care Management (AMB) (Deleted)
Care Management ?Clinical Social Work Note ? ?02/23/2022 ?Name: Paul Cook MRN: [id number] DOB: [birth year] ? ?Paul Cook is a 71 y.o. year old male who is a primary care patient of McLean-Scocuzza, [name], MD.  The Care Management team was consulted for assistance with chronic disease management and coordination needs. ? ?Engaged with patient by telephone for follow up visit in response to provider referral for social work chronic care management and care coordination services ? ?Consent to Services:  ?Paul Cook was given information about Care Management services today including:  ?Care Management services includes personalized support from designated clinical staff supervised by his physician, including individualized plan of care and coordination with other care providers ?24/7 contact phone numbers for assistance for urgent and routine care needs. ?The patient may stop case management services at any time by phone call to the office staff. ? ?Patient agreed to services and consent obtained.  ? ?Assessment: Review of patient past medical history, allergies, medications, and health status, including review of relevant consultants reports was performed today as part of a comprehensive evaluation and provision of chronic care management and care coordination services. ? ?SDOH (Social Determinants of Health) assessments and interventions performed:   ? ?Advanced Directives Status: Not addressed in this encounter. ? ?Care Plan ? ?Allergies  ?Allergen Reactions  ? Nsaids Other (See Comments)  ?  GI Issues  ? ? ?Outpatient Encounter Medications as of 02/23/2022  ?Medication Sig Note  ? amitriptyline (ELAVIL) 10 MG tablet TAKE 2 TABLETS BY MOUTH AT BEDTIME   ? amLODipine (NORVASC) 5 MG tablet 1 tablet   ? ASPIRIN LOW DOSE 81 MG EC tablet TAKE 1 TABLET BY MOUTH ONCE DAILY   ? ezetimibe (ZETIA) 10 MG tablet Take 1 tablet (10 mg total) by mouth daily.   ? gabapentin (NEURONTIN) 300 MG capsule Take 1 capsule (300 mg total)  by mouth daily.   ? gabapentin (NEURONTIN) 600 MG tablet TAKE 1 TABLET(600 MG) BY MOUTH THREE TIMES DAILY 07/20/2021: 300 mg QPM  ? lisinopril-hydrochlorothiazide (ZESTORETIC) 20-12.5 MG tablet Take 2 tablets by mouth daily.   ? metoprolol succinate (TOPROL-XL) 25 MG 24 hr tablet TAKE 1 TABLET(25 MG) BY MOUTH DAILY   ? mirabegron ER (MYRBETRIQ) 25 MG TB24 tablet TAKE 1 TABLET BY MOUTH ONCE DAILY AROUND7:00PM   ? rOPINIRole (REQUIP) 2 MG tablet TAKE 3 TABLETS(6 MG) BY MOUTH AT BEDTIME   ? ?No facility-administered encounter medications on file as of 02/23/2022.  ? ? ?Patient Active Problem List  ? Diagnosis Date Noted  ? Facet arthritis, degenerative, lumbar spine 10/12/2021  ? Posterior tibial tendinitis of right lower extremity 10/12/2021  ? LVH (left ventricular hypertrophy) 10/12/2021  ? Overactive bladder 10/12/2021  ? Daytime hypersomnolence 10/12/2021  ? Urine incontinence 07/28/2021  ? OCD (obsessive compulsive disorder) 07/04/2021  ? Elevated liver enzymes 07/04/2021  ? Vascular dementia with depressed mood ([place]) 07/02/2021  ? DOE (dyspnea on exertion) 08/06/2020  ? Abnormal EKG 08/06/2020  ? Coronary artery calcification seen on CT scan 07/13/2020  ? Coronary atherosclerosis due to calcified coronary lesion 06/10/2020  ? Aortic atherosclerosis ([place]) 06/10/2020  ? Lung nodule seen on imaging study 06/10/2020  ? Cyst of left kidney 05/29/2020  ? Gallstone 05/29/2020  ? Fatty liver 05/29/2020  ? Bilateral leg edema 05/15/2020  ? Venous stasis dermatitis of right lower extremity 05/15/2020  ? Right ankle swelling 12/17/2019  ? HLD (hyperlipidemia) 11/09/2018  ? History of alcohol abuse 09/06/2018  ? Chronic pain of right ankle  09/06/2018  ? Vitamin D deficiency 06/29/2018  ? Prediabetes 06/28/2018  ? Plantar fasciitis 08/29/2016  ? GERD (gastroesophageal reflux disease) 12/18/2015  ? Restless legs 03/19/2015  ? Essential hypertension 01/14/2014  ? Obstructive sleep apnea 12/17/2013  ? IBS (irritable bowel  syndrome) 07/30/2013  ? Chronic insomnia 06/12/2013  ? Cervical disc disorder with radiculopathy of cervical region 06/12/2013  ? Morbid obesity with BMI of 50.0-59.9, adult ([place]) 06/12/2013  ? Seasonal allergies 06/12/2013  ? Anxiety and depression 06/12/2013  ? ? ?Conditions to be addressed/monitored: Dementia; Memory Deficits ? ?Care Plan : General Social Work (Adult)  ?Updates made by Vern Claude, LCSW since 02/23/2022 12:00 AM  ?  ? ?Problem: CHL AMB "PATIENT-SPECIFIC PROBLEM"   ?Note:   ?CARE PLAN ENTRY ?(see longitudinal plan of care for additional care plan information) ? ?Current Barriers:  ?Patient with Dementia in need of assistance with connection to community resources  ?Knowledge deficits and need for support, education and care coordination related to community resources support  ?Memory Deficits ? ?Clinical Goal(s)  ?Over the next 90 days, patient's spouse will work with care management team member to address concerns related to respite and additional caregiver support ? ?Interventions provided by LCSW:  ?Continued to briefly assess patient's care coordination needs related to patient's identified community resource needs and discussed ongoing care management follow up  ?Patient's spouse unable to engage fully today, however she did stated that she was not able to contact the [facility] yet but plans to soon ?Patient's spouse has not heard from [facility] regarding respite care, collaboration phone call to  [facility] to follow up on referral for respite care, VM left for a return call. ?Advised patient's spouse  to contact [facility] to schedule a tour for possible admission-contact information provided-spouse agreeable to contacting this program ?This Education officer, museum agreeable to continued follow up regarding referring to the respite care program through the [facility] ?Collaborated with appropriate clinical care team members regarding patient  needs ?Continued encouragement provided for follow up with the community resources provided  ? ?Patient Self Care Activities & Deficits:  ?Patient is unable to independently navigate community resource options without care coordination support  ?Acknowledges deficits and is motivated to resolve concern  ?Patient is able to [facility]  as discussed today ?Unable to independently due to memory deficits ?Attends all scheduled provider appointments ? ?Initial goal documentation ? ? ?  ?  ? ?Follow Up Plan: SW will follow up with patient by phone over the next 14-21 business days ? ? , LCSW ?[place] ?[phone] ? ?

## 2022-02-23 NOTE — Chronic Care Management (AMB) (Deleted)
?Chronic Care Management  ? ? Clinical Social Work Note ? ?02/23/2022 ?Name: Paul Cook MRN: [id number] DOB: 1951/01/19 ? ?Paul Cook is a 71 y.o. year old male who is a primary care patient of Paul Cook, Paul Glow, MD. The CCM team was consulted to assist the patient with chronic disease management and/or care coordination needs related to: [employer] .  ? ?Collaboration with patient's spouse  for follow up visit in response to provider referral for social work chronic care management and care coordination services.  ? ?Consent to Services:  ?The patient was given information about Chronic Care Management services, agreed to services, and gave verbal consent prior to initiation of services.  Please see initial visit note for detailed documentation.  ? ?Patient agreed to services and consent obtained.  ? ?Assessment: Review of patient past medical history, allergies, medications, and health status, including review of relevant consultants reports was performed today as part of a comprehensive evaluation and provision of chronic care management and care coordination services.    ? ?SDOH (Social Determinants of Health) assessments and interventions performed:   ? ?Advanced Directives Status: Not addressed in this encounter. ? ?CCM Care Plan ? ?Allergies  ?Allergen Reactions  ? Nsaids Other (See Comments)  ?  GI Issues  ? ? ?Outpatient Encounter Medications as of 02/23/2022  ?Medication Sig Note  ? amitriptyline (ELAVIL) 10 MG tablet TAKE 2 TABLETS BY MOUTH AT BEDTIME   ? amLODipine (NORVASC) 5 MG tablet 1 tablet   ? ASPIRIN LOW DOSE 81 MG EC tablet TAKE 1 TABLET BY MOUTH ONCE DAILY   ? ezetimibe (ZETIA) 10 MG tablet Take 1 tablet (10 mg total) by mouth daily.   ? gabapentin (NEURONTIN) 300 MG capsule Take 1 capsule (300 mg total) by mouth daily.   ? gabapentin (NEURONTIN) 600 MG tablet TAKE 1 TABLET(600 MG) BY MOUTH THREE TIMES DAILY 07/20/2021: 300 mg QPM  ? lisinopril-hydrochlorothiazide (ZESTORETIC)  20-12.5 MG tablet Take 2 tablets by mouth daily.   ? metoprolol succinate (TOPROL-XL) 25 MG 24 hr tablet TAKE 1 TABLET(25 MG) BY MOUTH DAILY   ? mirabegron ER (MYRBETRIQ) 25 MG TB24 tablet TAKE 1 TABLET BY MOUTH ONCE DAILY AROUND7:00PM   ? rOPINIRole (REQUIP) 2 MG tablet TAKE 3 TABLETS(6 MG) BY MOUTH AT BEDTIME   ? ?No facility-administered encounter medications on file as of 02/23/2022.  ? ? ?Patient Active Problem List  ? Diagnosis Date Noted  ? Facet arthritis, degenerative, lumbar spine 10/12/2021  ? Posterior tibial tendinitis of right lower extremity 10/12/2021  ? LVH (left ventricular hypertrophy) 10/12/2021  ? Overactive bladder 10/12/2021  ? Daytime hypersomnolence 10/12/2021  ? Urine incontinence 07/28/2021  ? OCD (obsessive compulsive disorder) 07/04/2021  ? Elevated liver enzymes 07/04/2021  ? Vascular dementia with depressed mood ([place]) 07/02/2021  ? DOE (dyspnea on exertion) 08/06/2020  ? Abnormal EKG 08/06/2020  ? Coronary artery calcification seen on CT scan 07/13/2020  ? Coronary atherosclerosis due to calcified coronary lesion 06/10/2020  ? Aortic atherosclerosis ([place]) 06/10/2020  ? Lung nodule seen on imaging study 06/10/2020  ? Cyst of left kidney 05/29/2020  ? Gallstone 05/29/2020  ? Fatty liver 05/29/2020  ? Bilateral leg edema 05/15/2020  ? Venous stasis dermatitis of right lower extremity 05/15/2020  ? Right ankle swelling 12/17/2019  ? HLD (hyperlipidemia) 11/09/2018  ? History of alcohol abuse 09/06/2018  ? Chronic pain of right ankle 09/06/2018  ? Vitamin D deficiency 06/29/2018  ? Prediabetes 06/28/2018  ? Plantar fasciitis 08/29/2016  ?  GERD (gastroesophageal reflux disease) 12/18/2015  ? Restless legs 03/19/2015  ? Essential hypertension 01/14/2014  ? Obstructive sleep apnea 12/17/2013  ? IBS (irritable bowel syndrome) 07/30/2013  ? Chronic insomnia 06/12/2013  ? Cervical disc disorder with radiculopathy of cervical region 06/12/2013  ? Morbid obesity with BMI of 50.0-59.9, adult ([place])  06/12/2013  ? Seasonal allergies 06/12/2013  ? Anxiety and depression 06/12/2013  ? ? ?Conditions to be addressed/monitored: Dementia; Memory Deficits ? ?Care Plan : General Social Work (Adult)  ?Updates made by Vern Claude, LCSW since 02/23/2022 12:00 AM  ?  ? ?Problem: [facility] AMB "PATIENT-SPECIFIC PROBLEM"   ?Note:   ?CARE PLAN ENTRY ?(see longitudinal plan of care for additional care plan information) ? ?Current Barriers:  ?Patient with Dementia in need of assistance with connection to community resources  ?Knowledge deficits and need for support, education and care coordination related to community resources support  ?Memory Deficits ? ?Clinical Goal(s)  ?Over the next 90 days, patient's spouse will work with care management team member to address concerns related to respite and additional caregiver support ? ?Interventions provided by LCSW:  ?Continued to briefly assess patient's care coordination needs related to patient's identified community resource needs and discussed ongoing care management follow up  ?Patient's spouse unable to engage fully today, however she did stated that she was not able to contact [facility] yet but plans to soon ?Patient's spouse has not heard from [facility] regarding respite care, collaboration phone call to  [facility] to follow up on referral for respite care, VM left for a return call. ?Advised patient's spouse  to contact [facility] to schedule a tour for possible admission-contact information provided-spouse agreeable to contacting this program ?This Education officer, museum agreeable to continued follow up regarding referring to the respite care program through the [facility] ?Collaborated with appropriate clinical care team members regarding patient needs ?Continued encouragement provided for follow up with the community resources provided  ? ?Patient Self Care Activities & Deficits:  ?Patient is unable to independently navigate  community resource options without care coordination support  ?Acknowledges deficits and is motivated to resolve concern  ?Patient is able to [facility]  as discussed today ?Unable to independently due to memory deficits ?Attends all scheduled provider appointments ? ?Initial goal documentation ? ? ?  ?  ? ?Follow Up Plan: SW will follow up with patient by phone over the next 14-21 business days ?     ? , LCSW ?Clinical Social Worker  ?[facility] Care Management ?[phone] ? ? ? ?

## 2022-02-23 NOTE — Patient Instructions (Signed)
Visit Information ? ?Thank you for taking time to visit with me today. Please don't hesitate to contact me if I can be of assistance to you before our next scheduled telephone appointment. ? ?Following are the goals we discussed today:  ?Please follow up with the [facility] for possible admission ?Please anticipate a call from [facility] regarding respite care coordinaton ? ?Our next appointment is by telephone on 03/16/22 at 10am ? ?Please call the care guide team at [phone] if you need to cancel or reschedule your appointment.  ? ?If you are experiencing a Mental Health or [place] or need someone to talk to, please call the Suicide and Crisis Lifeline: 988  ? ?Patient verbalizes understanding of instructions and care plan provided today and agrees to view in [place]. Active MyChart status confirmed with patient.   ? ?Telephone follow up appointment with care management team member scheduled for: 03/16/22 ? ? , LCSW ?[place] ?[phone] ? ?

## 2022-02-24 ENCOUNTER — Other Ambulatory Visit: Payer: Self-pay | Admitting: Internal Medicine

## 2022-02-24 ENCOUNTER — Telehealth: Payer: Self-pay | Admitting: Internal Medicine

## 2022-02-24 DIAGNOSIS — I10 Essential (primary) hypertension: Secondary | ICD-10-CM

## 2022-02-24 DIAGNOSIS — N3281 Overactive bladder: Secondary | ICD-10-CM

## 2022-02-24 MED ORDER — METOPROLOL SUCCINATE ER 25 MG PO TB24: ORAL_TABLET | ORAL | 3 refills | Status: DC

## 2022-02-24 MED ORDER — MIRABEGRON ER 25 MG PO TB24: ORAL_TABLET | ORAL | 3 refills | Status: DC

## 2022-02-24 MED ORDER — GEMTESA 75 MG PO TABS: 1.0000 | ORAL_TABLET | Freq: Every day | ORAL | 3 refills | Status: DC

## 2022-02-24 NOTE — Telephone Encounter (Signed)
Will try same meds to [facility] drug If too expensive substitute with gemtasa or metoprolol immediate release if too expensive  ? ? ? ?

## 2022-02-24 NOTE — Telephone Encounter (Signed)
Pt called stating he can not afford some of his medications and would like a call back ?

## 2022-02-24 NOTE — Telephone Encounter (Signed)
Pt's wife states that pt is having hard time affording his medication, myrbetriq $45 dollars a month and metoprolol is $27 a month. Asking if substitute can be given or be sent to [facility] drug in [place]. She received an advertisement stating she can get prescriptions cheaper.  ?

## 2022-02-25 NOTE — Chronic Care Management (AMB) (Signed)
?Chronic Care Management  ? ? Clinical Social Work Note ? ?02/25/2022 ?Name: Paul Cook MRN: [id number] DOB: 1951/01/15 ? ?Paul Cook is a 71 y.o. year old male who is a primary care patient of McLean-Scocuzza, Paul Glow, MD. The CCM team was consulted to assist the patient with chronic disease management and/or care coordination needs related to: [employer] .  ? ?Collaboration with patient's spouse  for follow up visit in response to provider referral for social work chronic care management and care coordination services.  ? ?Consent to Services:  ?The patient was given information about Chronic Care Management services, agreed to services, and gave verbal consent prior to initiation of services.  Please see initial visit note for detailed documentation.  ? ?Patient agreed to services and consent obtained.  ? ?Assessment: Review of patient past medical history, allergies, medications, and health status, including review of relevant consultants reports was performed today as part of a comprehensive evaluation and provision of chronic care management and care coordination services.    ? ?SDOH (Social Determinants of Health) assessments and interventions performed:   ? ?Advanced Directives Status: Not addressed in this encounter. ? ?CCM Care Plan ? ?Allergies  ?Allergen Reactions  ? Nsaids Other (See Comments)  ?  GI Issues  ? ? ?Outpatient Encounter Medications as of 02/23/2022  ?Medication Sig Note  ? amitriptyline (ELAVIL) 10 MG tablet TAKE 2 TABLETS BY MOUTH AT BEDTIME   ? amLODipine (NORVASC) 5 MG tablet 1 tablet   ? ASPIRIN LOW DOSE 81 MG EC tablet TAKE 1 TABLET BY MOUTH ONCE DAILY   ? ezetimibe (ZETIA) 10 MG tablet Take 1 tablet (10 mg total) by mouth daily.   ? gabapentin (NEURONTIN) 300 MG capsule Take 1 capsule (300 mg total) by mouth daily.   ? gabapentin (NEURONTIN) 600 MG tablet TAKE 1 TABLET(600 MG) BY MOUTH THREE TIMES DAILY 07/20/2021: 300 mg QPM  ? lisinopril-hydrochlorothiazide (ZESTORETIC)  20-12.5 MG tablet Take 2 tablets by mouth daily.   ? rOPINIRole (REQUIP) 2 MG tablet TAKE 3 TABLETS(6 MG) BY MOUTH AT BEDTIME   ? [DISCONTINUED] metoprolol succinate (TOPROL-XL) 25 MG 24 hr tablet TAKE 1 TABLET(25 MG) BY MOUTH DAILY   ? [DISCONTINUED] mirabegron ER (MYRBETRIQ) 25 MG TB24 tablet TAKE 1 TABLET BY MOUTH ONCE DAILY AROUND7:00PM   ? ?No facility-administered encounter medications on file as of 02/23/2022.  ? ? ?Patient Active Problem List  ? Diagnosis Date Noted  ? Facet arthritis, degenerative, lumbar spine 10/12/2021  ? Posterior tibial tendinitis of right lower extremity 10/12/2021  ? LVH (left ventricular hypertrophy) 10/12/2021  ? Overactive bladder 10/12/2021  ? Daytime hypersomnolence 10/12/2021  ? Urine incontinence 07/28/2021  ? OCD (obsessive compulsive disorder) 07/04/2021  ? Elevated liver enzymes 07/04/2021  ? Vascular dementia with depressed mood ([place]) 07/02/2021  ? DOE (dyspnea on exertion) 08/06/2020  ? Abnormal EKG 08/06/2020  ? Coronary artery calcification seen on CT scan 07/13/2020  ? Coronary atherosclerosis due to calcified coronary lesion 06/10/2020  ? Aortic atherosclerosis ([place]) 06/10/2020  ? Lung nodule seen on imaging study 06/10/2020  ? Cyst of left kidney 05/29/2020  ? Gallstone 05/29/2020  ? Fatty liver 05/29/2020  ? Bilateral leg edema 05/15/2020  ? Venous stasis dermatitis of right lower extremity 05/15/2020  ? Right ankle swelling 12/17/2019  ? HLD (hyperlipidemia) 11/09/2018  ? History of alcohol abuse 09/06/2018  ? Chronic pain of right ankle 09/06/2018  ? Vitamin D deficiency 06/29/2018  ? Prediabetes 06/28/2018  ? Plantar fasciitis 08/29/2016  ?  GERD (gastroesophageal reflux disease) 12/18/2015  ? Restless legs 03/19/2015  ? Essential hypertension 01/14/2014  ? Obstructive sleep apnea 12/17/2013  ? IBS (irritable bowel syndrome) 07/30/2013  ? Chronic insomnia 06/12/2013  ? Cervical disc disorder with radiculopathy of cervical region 06/12/2013  ? Morbid obesity with  BMI of 50.0-59.9, adult ([place]) 06/12/2013  ? Seasonal allergies 06/12/2013  ? Anxiety and depression 06/12/2013  ? ? ?Conditions to be addressed/monitored: Dementia; Memory Deficits ? ?There are no care plans that you recently modified to display for this patient. ?  ? ?Follow Up Plan: SW will follow up with patient by phone over the next 14 business days ?     ? , LCSW ?[place] ?[phone] ? ? ? ?

## 2022-03-02 ENCOUNTER — Telehealth: Payer: PPO

## 2022-03-02 ENCOUNTER — Ambulatory Visit: Payer: PPO | Admitting: *Deleted

## 2022-03-02 DIAGNOSIS — I10 Essential (primary) hypertension: Secondary | ICD-10-CM

## 2022-03-02 DIAGNOSIS — F0153 Vascular dementia, unspecified severity, with mood disturbance: Secondary | ICD-10-CM

## 2022-03-02 NOTE — Patient Instructions (Addendum)
Visit Information ? ?Thank you for taking time to visit with me today. Please don't hesitate to contact me if I can be of assistance to you before our next scheduled telephone appointment. ? ?Following are the goals we discussed today:  ?Patient will attend all scheduled provider appointments as evidenced by clinician review of documented attendance to scheduled appointments and patient/caregiver report ?Patient will continue to perform ADL's independently as evidenced by patient/caregiver report ?Patient will call provider office for new concerns or questions as evidenced by review of documented incoming telephone call notes and patient report ?Check blood pressure weekly ?Write blood pressure results in a log for provider review ?Keep medicine where it is safe or locked ?Do not drive; do not drink and drive ?Use appliances that have an auto shut-off feature  ?Keep patient active during the day; do more crossword puzzle ?Limit naps throughout the day ?Do one enjoyable thing every day; laugh; watch a funny movie or comedian ?Eat healthy ?Get outdoors every day (weather permitting) ?Spend time or talk with others at least 2 to 3 times per week ?Continue with AA and wearing CPAP ?Work with CSW for Adult Day Care options and other resources for patient ? ?Our next appointment is by telephone on 6/19 at 1000 ? ?Please call the care guide team at [phone] if you need to cancel or reschedule your appointment.  ? ?If you are experiencing a Mental Health or [place] or need someone to talk to, please call the Suicide and Crisis Lifeline: 988 ?call the [place] National Suicide Prevention Lifeline: 470-237-8438 or TTY: 609-287-5249 TTY 559-393-6519) to talk to a trained counselor ?call 1-800-273-TALK (toll free, 24 hour hotline) ?call 911  ? ?The patient verbalized understanding of instructions, educational materials, and care plan provided today and DECLINED offer to receive copy of patient instructions,  educational materials, and care plan.  ?Hubert Azure RN, MSN ?RN Care Management Coordinator ?[place] ?[phone] ?[email or web address] ? ?
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
## 2022-03-09 ENCOUNTER — Ambulatory Visit: Payer: PPO | Admitting: *Deleted

## 2022-03-09 DIAGNOSIS — I10 Essential (primary) hypertension: Secondary | ICD-10-CM

## 2022-03-09 DIAGNOSIS — F0153 Vascular dementia, unspecified severity, with mood disturbance: Secondary | ICD-10-CM

## 2022-03-10 ENCOUNTER — Other Ambulatory Visit: Payer: Self-pay | Admitting: Internal Medicine

## 2022-03-10 ENCOUNTER — Telehealth: Payer: Self-pay | Admitting: Pharmacist

## 2022-03-10 DIAGNOSIS — I10 Essential (primary) hypertension: Secondary | ICD-10-CM

## 2022-03-10 MED ORDER — METOPROLOL TARTRATE 25 MG PO TABS: 25.0000 mg | ORAL_TABLET | Freq: Two times a day (BID) | ORAL | 3 refills | Status: DC

## 2022-03-10 NOTE — Chronic Care Management (AMB) (Addendum)
Chronic Care Management    Clinical Social Work Note  03/10/2022 Name: Paul Cook MRN: [id number] DOB: September 10, 1951  Paul Cook is a 71 y.o. year old male who is a primary care patient of McLean-Scocuzza, [name], MD. The CCM team was consulted to assist the patient with chronic disease management and/or care coordination needs related to: [employer] .   Engaged with patient's spouse by telephone for follow up visit in response to provider referral for social work chronic care management and care coordination services.   Consent to Services:  The patient was given information about Chronic Care Management services, agreed to services, and gave verbal consent prior to initiation of services.  Please see initial visit note for detailed documentation.   Patient agreed to services and consent obtained.   Assessment: Review of patient past medical history, allergies, medications, and health status, including review of relevant consultants reports was performed today as part of a comprehensive evaluation and provision of chronic care management and care coordination services.     SDOH (Social Determinants of Health) assessments and interventions performed:    Advanced Directives Status: Not addressed in this encounter.  CCM Care Plan  Allergies  Allergen Reactions   Nsaids Other (See Comments)    GI Issues    Outpatient Encounter Medications as of 02/23/2022  Medication Sig Note   amitriptyline (ELAVIL) 10 MG tablet TAKE 2 TABLETS BY MOUTH AT BEDTIME    amLODipine (NORVASC) 5 MG tablet 1 tablet    ASPIRIN LOW DOSE 81 MG EC tablet TAKE 1 TABLET BY MOUTH ONCE DAILY    ezetimibe (ZETIA) 10 MG tablet Take 1 tablet (10 mg total) by mouth daily.    gabapentin (NEURONTIN) 300 MG capsule Take 1 capsule (300 mg total) by mouth daily.    gabapentin (NEURONTIN) 600 MG tablet TAKE 1 TABLET(600 MG) BY MOUTH THREE TIMES DAILY 07/20/2021: 300 mg QPM   lisinopril-hydrochlorothiazide  (ZESTORETIC) 20-12.5 MG tablet Take 2 tablets by mouth daily.    rOPINIRole (REQUIP) 2 MG tablet TAKE 3 TABLETS(6 MG) BY MOUTH AT BEDTIME    [DISCONTINUED] metoprolol succinate (TOPROL-XL) 25 MG 24 hr tablet TAKE 1 TABLET(25 MG) BY MOUTH DAILY    [DISCONTINUED] mirabegron ER (MYRBETRIQ) 25 MG TB24 tablet TAKE 1 TABLET BY MOUTH ONCE DAILY AROUND7:00PM    No facility-administered encounter medications on file as of 02/23/2022.    Patient Active Problem List   Diagnosis Date Noted   Facet arthritis, degenerative, lumbar spine 10/12/2021   Posterior tibial tendinitis of right lower extremity 10/12/2021   LVH (left ventricular hypertrophy) 10/12/2021   Overactive bladder 10/12/2021   Daytime hypersomnolence 10/12/2021   Urine incontinence 07/28/2021   OCD (obsessive compulsive disorder) 07/04/2021   Elevated liver enzymes 07/04/2021   Vascular dementia with depressed mood ([place]) 07/02/2021   DOE (dyspnea on exertion) 08/06/2020   Abnormal EKG 08/06/2020   Coronary artery calcification seen on CT scan 07/13/2020   Coronary atherosclerosis due to calcified coronary lesion 06/10/2020   Aortic atherosclerosis ([place]) 06/10/2020   Lung nodule seen on imaging study 06/10/2020   Cyst of left kidney 05/29/2020   Gallstone 05/29/2020   Fatty liver 05/29/2020   Bilateral leg edema 05/15/2020   Venous stasis dermatitis of right lower extremity 05/15/2020   Right ankle swelling 12/17/2019   HLD (hyperlipidemia) 11/09/2018   History of alcohol abuse 09/06/2018   Chronic pain of right ankle 09/06/2018   Vitamin D deficiency 06/29/2018   Prediabetes 06/28/2018   Plantar fasciitis  08/29/2016   GERD (gastroesophageal reflux disease) 12/18/2015   Restless legs 03/19/2015   Essential hypertension 01/14/2014   Obstructive sleep apnea 12/17/2013   IBS (irritable bowel syndrome) 07/30/2013   Chronic insomnia 06/12/2013   Cervical disc disorder with radiculopathy of cervical region 06/12/2013   Morbid  obesity with BMI of 50.0-59.9, adult ([place]) 06/12/2013   Seasonal allergies 06/12/2013   Anxiety and depression 06/12/2013    Conditions to be addressed/monitored: Dementia; Memory Deficits  Care Plan : General Social Work (Adult)  Updates made by Vern Claude, LCSW since 03/10/2022 12:00 AM     Problem: CHL AMB "PATIENT-SPECIFIC PROBLEM"   Note:   CARE PLAN ENTRY (see longitudinal plan of care for additional care plan information)  Current Barriers:  Patient with Dementia in need of assistance with connection to community resources  Knowledge deficits and need for support, education and care coordination related to community resources support  Memory Deficits  Clinical Goal(s)  Over the next 90 days, patient's spouse will work with care management team member to address concerns related to respite and additional caregiver support  Interventions provided by LCSW:  Continued to assess patient's care coordination needs related to patient's identified community resource needs and discussed ongoing care management follow up  Patient's spouse unable to engage fully today, however she did stated that she was not able to contact [facility] yet but plans to soon Patient's spouse has not heard from [facility] regarding respite care, collaboration phone call to  [facility] to follow up on referral for respite care, referral completed and patient placed on wait list-spoke with  Federico Flake. Per Sarai,  [facility] to start taking patient in July, patient's spouse to be contacted by the end of June Advised patient's spouse  to contact [facility] to schedule a tour for possible admission-contact information provided-spouse agreeable to contacting this program This Education officer, museum agreeable to continued follow up regarding referring to the respite care program through the [facility] with appropriate clinical care team  members regarding patient needs Continued encouragement provided for follow up with the community resources provided   Patient Self Care Activities & Deficits:  Patient is unable to independently navigate community resource options without care coordination support  Acknowledges deficits and is motivated to resolve concern  Patient is able to [facility]  as discussed today Unable to independently due to memory deficits Attends all scheduled provider appointments  Please see past updates related to this goal by clicking on the "Past Updates" button in the selected goal         Follow Up Plan: SW will follow up with patient by phone over the next 14 business days     [employer], [place] [phone]

## 2022-03-10 NOTE — Chronic Care Management (AMB) (Signed)
Chronic Care Management    Clinical Social Work Note  03/10/2022 Name: Paul Cook MRN: [id number] DOB: [birth year]  [place] Frieden is a 71 y.o. year old male who is a primary care patient of McLean-Scocuzza, [name], MD. The CCM team was consulted to assist the patient with chronic disease management and/or care coordination needs related to: [employer] .   Collaboration with patient's spouse and RNCM  for follow up visit in response to provider referral for social work chronic care management and care coordination services.   Consent to Services:  The patient was given information about Chronic Care Management services, agreed to services, and gave verbal consent prior to initiation of services.  Please see initial visit note for detailed documentation.   Patient agreed to services and consent obtained.   Assessment: Review of patient past medical history, allergies, medications, and health status, including review of relevant consultants reports was performed today as part of a comprehensive evaluation and provision of chronic care management and care coordination services.     SDOH (Social Determinants of Health) assessments and interventions performed:    Advanced Directives Status: Not addressed in this encounter.  CCM Care Plan  Allergies  Allergen Reactions   Nsaids Other (See Comments)    GI Issues    Outpatient Encounter Medications as of 03/09/2022  Medication Sig Note   amitriptyline (ELAVIL) 10 MG tablet TAKE 2 TABLETS BY MOUTH AT BEDTIME    Vibegron (GEMTESA) 75 MG TABS Take 1 tablet by mouth daily. D/c myrbetriq    amLODipine (NORVASC) 5 MG tablet 1 tablet    ASPIRIN LOW DOSE 81 MG EC tablet TAKE 1 TABLET BY MOUTH ONCE DAILY    ezetimibe (ZETIA) 10 MG tablet Take 1 tablet (10 mg total) by mouth daily.    gabapentin (NEURONTIN) 300 MG capsule Take 1 capsule (300 mg total) by mouth daily.    gabapentin (NEURONTIN) 600 MG tablet TAKE 1 TABLET(600 MG) BY MOUTH  THREE TIMES DAILY 07/20/2021: 300 mg QPM   lisinopril-hydrochlorothiazide (ZESTORETIC) 20-12.5 MG tablet Take 2 tablets by mouth daily.    metoprolol succinate (TOPROL-XL) 25 MG 24 hr tablet TAKE 1 TABLET(25 MG) BY MOUTH DAILY    rOPINIRole (REQUIP) 2 MG tablet TAKE 3 TABLETS(6 MG) BY MOUTH AT BEDTIME    No facility-administered encounter medications on file as of 03/09/2022.    Patient Active Problem List   Diagnosis Date Noted   Facet arthritis, degenerative, lumbar spine 10/12/2021   Posterior tibial tendinitis of right lower extremity 10/12/2021   LVH (left ventricular hypertrophy) 10/12/2021   Overactive bladder 10/12/2021   Daytime hypersomnolence 10/12/2021   Urine incontinence 07/28/2021   OCD (obsessive compulsive disorder) 07/04/2021   Elevated liver enzymes 07/04/2021   Vascular dementia with depressed mood ([place]) 07/02/2021   DOE (dyspnea on exertion) 08/06/2020   Abnormal EKG 08/06/2020   Coronary artery calcification seen on CT scan 07/13/2020   Coronary atherosclerosis due to calcified coronary lesion 06/10/2020   Aortic atherosclerosis ([place]) 06/10/2020   Lung nodule seen on imaging study 06/10/2020   Cyst of left kidney 05/29/2020   Gallstone 05/29/2020   Fatty liver 05/29/2020   Bilateral leg edema 05/15/2020   Venous stasis dermatitis of right lower extremity 05/15/2020   Right ankle swelling 12/17/2019   HLD (hyperlipidemia) 11/09/2018   History of alcohol abuse 09/06/2018   Chronic pain of right ankle 09/06/2018   Vitamin D deficiency 06/29/2018   Prediabetes 06/28/2018   Plantar fasciitis 08/29/2016  GERD (gastroesophageal reflux disease) 12/18/2015   Restless legs 03/19/2015   Essential hypertension 01/14/2014   Obstructive sleep apnea 12/17/2013   IBS (irritable bowel syndrome) 07/30/2013   Chronic insomnia 06/12/2013   Cervical disc disorder with radiculopathy of cervical region 06/12/2013   Morbid obesity with BMI of 50.0-59.9, adult ([place]) 06/12/2013    Seasonal allergies 06/12/2013   Anxiety and depression 06/12/2013    Conditions to be addressed/monitored: Dementia; Memory Deficits  Care Plan : General Social Work (Adult)  Updates made by Vern Claude, LCSW since 03/10/2022 12:00 AM     Problem: CHL AMB "PATIENT-SPECIFIC PROBLEM"   Note:   CARE PLAN ENTRY (see longitudinal plan of care for additional care plan information)  Current Barriers:  Patient with Dementia in need of assistance with connection to community resources  Knowledge deficits and need for support, education and care coordination related to community resources support  Memory Deficits  Clinical Goal(s)  Over the next 90 days, patient's spouse will work with care management team member to address concerns related to respite and additional caregiver support  Interventions provided by LCSW:  Continued to assess patient's care coordination needs related to patient's identified community resource needs and discussed ongoing care management follow up  Patient's spouse was not able to contact [facility] and has decided against follow up for this program  Confirmed that the referral has been completed for [facility] regarding respite care, patient placed on wait list-confirmed referral made with Federico Flake. It was was explained that [facility] will start taking patient in July, patient's spouse to be contacted by the end of June Patient's spouse concerned about affordability of his medications, providers office has been notified-collaboration with RNCM who confirmed that referral has been made to the pharmacy team for medication reconciliation This social worker agreeable to continued follow up regarding referring to the respite care program through the [facility] Continued encouragement provided for follow up with the community resources provided   Patient Self Care Activities & Deficits:  Patient is unable to  independently navigate community resource options without care coordination support  Acknowledges deficits and is motivated to resolve concern  Patient is able to [facility]  as discussed today Unable to independently due to memory deficits Attends all scheduled provider appointments  Please see past updates related to this goal by clicking on the "Past Updates" button in the selected goal         Follow Up Plan: SW will follow up with patient by phone over the next 14 business days      [employer], [place] [phone]

## 2022-03-10 NOTE — Progress Notes (Signed)
Thank you so much for your update!   Have a great day, Noreene Larsson, [place], [place], [place] [address] Direct Dial: [phone] [email or web address] Website: [email or web address]

## 2022-03-10 NOTE — Progress Notes (Signed)
Paul Cook [facility])  [place] Team    03/10/2022  Ebrima Ranta 1950-11-13 [id number]  Reason for referral: Medication Assistance  Referral source: [facility] RN Current insurance: [employer]  Outreach:  Successful telephone call with [facility], spouse.  HIPAA identifiers verified.    Objective: The ASCVD Risk score (Arnett DK, et al., 2019) failed to calculate for the following reasons:   The patient has a prior MI or stroke diagnosis  Lab Results  Component Value Date   CREATININE 0.91 07/02/2021   CREATININE 0.98 05/15/2020   CREATININE 0.92 01/23/2020    Lab Results  Component Value Date   HGBA1C 6.1 07/02/2021    Lipid Panel     Component Value Date/Time   CHOL 149 07/02/2021 1041   TRIG 81.0 07/02/2021 1041   HDL 51.00 07/02/2021 1041   CHOLHDL 3 07/02/2021 1041   VLDL 16.2 07/02/2021 1041   LDLCALC 82 07/02/2021 1041    BP Readings from Last 3 Encounters:  10/20/21 115/63  10/12/21 130/70  08/31/21 125/79    Allergies  Allergen Reactions   Nsaids Other (See Comments)    GI Issues   Medication Assistance Findings:  Medication assistance needs identified: Myrbetriq and metoprolol succinate.   Myrbetriq:  Reviewed patient formulary, HTA PPO-1. Myrbetriq cost is ~$45.00 for a 30 day supply of medication at a preferred retail pharmacy.  There is not a patient assistance program available for Mybetriq due to coverage on the HTA formulary.  Discussed the potential for therapeutic substitutions with Mrs. Garbers and informed her of potential side effect profile including but not limited to anticholinergic effects of generic OAB medications. Discussed difference in mechanism of action with generic OAB medications vs. Mybetriq being primary reason. Mrs. Romanoff wishes to trial a generic OAB medication for cost savings.   Metoprolol Succinate:  On review of HTA PPO-1 formulary, metoprolol succinate is ~$30.00 for a 90  day supply. Metoprolol succinate is a generic medication, therefore no patient assistance program available either.  Based on review of HTA PPO-1 formulary, metoprolol tartrate 25 mg is ~$10.00 for a 90 day supply at a preferred retail pharmacy.   Preferred Pharmacy Research:  Per request of Mrs. Paul-Wiens, checked [employer] and per website, [facility] in [place] is a preferred pharmacy with HTA in 2023.   Extra Help:  May be eligible for   Extra Help Low Income Subsidy based on reported income and assets. Mrs. Haugan will submit income to [employer] for better determination of qualification for Extra Help Program. I have advised Mrs. Bailee Thall it is best to go to the [employer] in person to apply for Extra Help if we find that Mr. And Mrs. Fatzinger fall into the income range for program. Mrs. Salvadore Dom verbalized understanding.   Patient Assistance Programs: Myrbetriq made by [employer] excludes assistance for patients that has a drug plan that includes coverage on the drug formulary   Additional medication assistance options reviewed with patient as warranted:  No other options identified  Plan: Will contact Dr. Terese Door regarding  therapeutic substitutions for Myrbetriq and metoprolol succinate .  Confirmed with patient's spouse, Mrs. Bair to send medications to [facility].  Will check in on the costs after medications are filled at [facility] prior to closing case out.    Loretha Brasil, PharmD [place] Pharmacist Office: [phone]

## 2022-03-10 NOTE — Patient Instructions (Signed)
Visit Information  Thank you for taking time to visit with me today. Please don't hesitate to contact me if I can be of assistance to you before our next scheduled telephone appointment.  Following are the goals we discussed today:  Referral made for respite care services with [facility] new patients will be added in July-please expect a call by June Referral made to the pharmacist for medication reconciliation-please expect a call from them for review of medications  Our next appointment is by telephone on 03/23/22 at 10am  Please call the care guide team at [phone] if you need to cancel or reschedule your appointment.   If you are experiencing a Mental Health or [place] or need someone to talk to, please call the Suicide and Crisis Lifeline: 988   Patient verbalizes understanding of instructions and care plan provided today and agrees to view in [place]. Active MyChart status and patient understanding of how to access instructions and care plan via MyChart confirmed with patient.     Telephone follow up appointment with care management team member scheduled for: 03/22/22   Elliot Gurney, [place] [phone]  10

## 2022-03-11 MED ORDER — SOLIFENACIN SUCCINATE 5 MG PO TABS: 5.0000 mg | ORAL_TABLET | Freq: Every day | ORAL | 3 refills | Status: DC

## 2022-03-11 NOTE — Addendum Note (Signed)
Addended by: Orland Mustard on: 03/11/2022 05:23 PM   Modules accepted: Orders

## 2022-03-15 NOTE — Progress Notes (Signed)
[facility])                                            [place] Team    03/15/2022  Rea Reser 30-Aug-1951 [id number]   Placed call to [facility], spoke with [facility]. Leafy Ro states she is working on the pill packs for the patient now, new costs are verified as metoprolol tartrate 25 mg 1 tab PO BID #180 $10.00 and solifenacin 5 mg 1 tab PO daily #90 $30.00.   Mandy requested me to ask Mrs. Bear Osten to call pharmacy for approval to place new medications into patients pill packs.   Placed call to Mrs. Paul-Henery and discussed the medication changes and new costs, also relayed message to call Mandy at [facility] to confirm new medications and pick up date.  Mrs. Dirosa verbalized understanding and was appreciative for my call.    [facility] pharmacy case is being closed due to the following reasons:  -Medications reported to be cost barrier with patient have been addressed and information relayed to patient's spouse.    [facility] is happy to assist the patient/family in the future for clinical pharmacy needs, following a discussion from your team about [place] outreach. Thank you for allowing [facility] to be a part of your patient's care.    Loretha Brasil, PharmD [place] Pharmacist Office: [phone]

## 2022-03-16 ENCOUNTER — Other Ambulatory Visit: Payer: Self-pay | Admitting: Gastroenterology

## 2022-03-16 DIAGNOSIS — G4700 Insomnia, unspecified: Secondary | ICD-10-CM | POA: Diagnosis not present

## 2022-03-16 DIAGNOSIS — F411 Generalized anxiety disorder: Secondary | ICD-10-CM

## 2022-03-16 DIAGNOSIS — F0390 Unspecified dementia without behavioral disturbance: Secondary | ICD-10-CM

## 2022-03-16 DIAGNOSIS — F32A Depression, unspecified: Secondary | ICD-10-CM | POA: Diagnosis not present

## 2022-03-16 DIAGNOSIS — I10 Essential (primary) hypertension: Secondary | ICD-10-CM | POA: Diagnosis not present

## 2022-03-16 DIAGNOSIS — G4733 Obstructive sleep apnea (adult) (pediatric): Secondary | ICD-10-CM | POA: Diagnosis not present

## 2022-03-16 DIAGNOSIS — R0681 Apnea, not elsewhere classified: Secondary | ICD-10-CM | POA: Diagnosis not present

## 2022-03-23 ENCOUNTER — Telehealth: Payer: PPO

## 2022-03-30 ENCOUNTER — Ambulatory Visit: Payer: PPO | Admitting: *Deleted

## 2022-03-30 DIAGNOSIS — I10 Essential (primary) hypertension: Secondary | ICD-10-CM

## 2022-03-30 DIAGNOSIS — F0153 Vascular dementia, unspecified severity, with mood disturbance: Secondary | ICD-10-CM

## 2022-03-30 NOTE — Chronic Care Management (AMB) (Signed)
Chronic Care Management    Clinical Social Work Note  03/30/2022 Name: Darcel Frane MRN: [id number] DOB: [birth year]  Jamael Hoffmann is a 71 y.o. year old male who is a primary care patient of McLean-Scocuzza, [name], MD. The CCM team was consulted to assist the patient with chronic disease management and/or care coordination needs related to: [employer] .   Collaboration with patient's spouse  for follow up visit in response to provider referral for social work chronic care management and care coordination services.   Consent to Services:  The patient was given information about Chronic Care Management services, agreed to services, and gave verbal consent prior to initiation of services.  Please see initial visit note for detailed documentation.   Patient agreed to services and consent obtained.   Assessment: Review of patient past medical history, allergies, medications, and health status, including review of relevant consultants reports was performed today as part of a comprehensive evaluation and provision of chronic care management and care coordination services.     SDOH (Social Determinants of Health) assessments and interventions performed:    Advanced Directives Status: Not addressed in this encounter.  CCM Care Plan  Allergies  Allergen Reactions   Nsaids Other (See Comments)    GI Issues    Outpatient Encounter Medications as of 03/30/2022  Medication Sig Note   amitriptyline (ELAVIL) 10 MG tablet TAKE 2 TABLETS BY MOUTH AT BEDTIME    amLODipine (NORVASC) 5 MG tablet 1 tablet    ASPIRIN LOW DOSE 81 MG EC tablet TAKE 1 TABLET BY MOUTH ONCE DAILY    ezetimibe (ZETIA) 10 MG tablet Take 1 tablet (10 mg total) by mouth daily.    gabapentin (NEURONTIN) 300 MG capsule Take 1 capsule (300 mg total) by mouth daily.    gabapentin (NEURONTIN) 600 MG tablet TAKE 1 TABLET(600 MG) BY MOUTH THREE TIMES DAILY 07/20/2021: 300 mg QPM   lisinopril-hydrochlorothiazide (ZESTORETIC)  20-12.5 MG tablet Take 2 tablets by mouth daily.    metoprolol tartrate (LOPRESSOR) 25 MG tablet Take 1 tablet (25 mg total) by mouth 2 (two) times daily. D/c xl due to cost    rOPINIRole (REQUIP) 2 MG tablet TAKE 3 TABLETS(6 MG) BY MOUTH AT BEDTIME    solifenacin (VESICARE) 5 MG tablet Take 1 tablet (5 mg total) by mouth daily.    No facility-administered encounter medications on file as of 03/30/2022.    Patient Active Problem List   Diagnosis Date Noted   Facet arthritis, degenerative, lumbar spine 10/12/2021   Posterior tibial tendinitis of right lower extremity 10/12/2021   LVH (left ventricular hypertrophy) 10/12/2021   Overactive bladder 10/12/2021   Daytime hypersomnolence 10/12/2021   Urine incontinence 07/28/2021   OCD (obsessive compulsive disorder) 07/04/2021   Elevated liver enzymes 07/04/2021   Vascular dementia with depressed mood ([place]) 07/02/2021   DOE (dyspnea on exertion) 08/06/2020   Abnormal EKG 08/06/2020   Coronary artery calcification seen on CT scan 07/13/2020   Coronary atherosclerosis due to calcified coronary lesion 06/10/2020   Aortic atherosclerosis ([place]) 06/10/2020   Lung nodule seen on imaging study 06/10/2020   Cyst of left kidney 05/29/2020   Gallstone 05/29/2020   Fatty liver 05/29/2020   Bilateral leg edema 05/15/2020   Venous stasis dermatitis of right lower extremity 05/15/2020   Right ankle swelling 12/17/2019   HLD (hyperlipidemia) 11/09/2018   History of alcohol abuse 09/06/2018   Chronic pain of right ankle 09/06/2018   Vitamin D deficiency 06/29/2018   Prediabetes 06/28/2018  Plantar fasciitis 08/29/2016   GERD (gastroesophageal reflux disease) 12/18/2015   Restless legs 03/19/2015   Essential hypertension 01/14/2014   Obstructive sleep apnea 12/17/2013   IBS (irritable bowel syndrome) 07/30/2013   Chronic insomnia 06/12/2013   Cervical disc disorder with radiculopathy of cervical region 06/12/2013   Morbid obesity with BMI of  50.0-59.9, adult ([place]) 06/12/2013   Seasonal allergies 06/12/2013   Anxiety and depression 06/12/2013    Conditions to be addressed/monitored: Dementia; Memory Deficits  Care Plan : General Social Work (Adult)  Updates made by Vern Claude, LCSW since 03/30/2022 12:00 AM     Problem: CHL AMB "PATIENT-SPECIFIC PROBLEM"   Note:   CARE PLAN ENTRY (see longitudinal plan of care for additional care plan information)  Current Barriers:  Patient with Dementia in need of assistance with connection to community resources  Knowledge deficits and need for support, education and care coordination related to community resources support  Memory Deficits  Clinical Goal(s)  Over the next 90 days, patient's spouse will work with care management team member to address concerns related to respite and additional caregiver support  Interventions provided by LCSW:  Continued to assess patient's care coordination needs related to patient's identified community resource needs and discussed ongoing care management follow up  Confirmed that the referral has been completed for [facility] regarding respite care, patient placed on wait list-confirmed referral made with Federico Flake. It was was explained that [facility] will start taking patient in July, patient's spouse to be contacted by the end of June Patient's spouse concerned about affordability of his medications, providers office has been notified-pharmacy team for medication reconciliation now involved This social worker agreeable to continued follow up regarding referring to the respite care program through the [facility] Continued encouragement provided for follow up with the community resources provided-no additional community resource needs requested at this time Follow up in 30 days  Patient Self Care Activities & Deficits:  Patient is unable to independently navigate community resource options without care  coordination support  Acknowledges deficits and is motivated to resolve concern  Patient is able to contact [facility]  as discussed today Unable to independently due to memory deficits Attends all scheduled provider appointments  Please see past updates related to this goal by clicking on the "Past Updates" button in the selected goal         Follow Up Plan: SW will follow up with patient by phone over the next 30 business days      [employer], [place] [phone]

## 2022-03-30 NOTE — Patient Instructions (Signed)
Visit Information  Thank you for taking time to visit with me today. Please don't hesitate to contact me if I can be of assistance to you before our next scheduled telephone appointment.  Following are the goals we discussed today:  Continue to follow up with community resources provided for respite support-referrals to begin processing near the end of June  Our next appointment is by telephone on 04/27/22 at 11am  Please call the care guide team at [phone] if you need to cancel or reschedule your appointment.   If you are experiencing a Mental Health or [place] or need someone to talk to, please call the Suicide and Crisis Lifeline: 988   Patient verbalizes understanding of instructions and care plan provided today and agrees to view in [place]. Active MyChart status and patient understanding of how to access instructions and care plan via MyChart confirmed with patient.     Telephone follow up appointment with care management team member scheduled for: 04/27/22 Elliot Gurney, [place] [phone]

## 2022-04-04 ENCOUNTER — Ambulatory Visit: Payer: PPO | Admitting: *Deleted

## 2022-04-04 DIAGNOSIS — I10 Essential (primary) hypertension: Secondary | ICD-10-CM

## 2022-04-04 DIAGNOSIS — F0153 Vascular dementia, unspecified severity, with mood disturbance: Secondary | ICD-10-CM

## 2022-04-04 NOTE — Chronic Care Management (AMB) (Signed)
  Care Management   Follow Up Note   04/04/2022 Name: Paul Cook MRN: [id number] DOB: 1951-08-14   Referred by: McLean-Scocuzza, [name], MD Reason for referral : Chronic Care Management (HTN)   Successful telephone outreach to pai tients wife foe follow up.  Wife states she is too busy to talk and requested call back.  Follow Up Plan: The care management team will reach out to the patient again over the next 30 days.   Hubert Azure RN, MSN RN Care Management Coordinator [place] [phone] .[email or web address]

## 2022-04-12 DIAGNOSIS — L821 Other seborrheic keratosis: Secondary | ICD-10-CM | POA: Diagnosis not present

## 2022-04-12 DIAGNOSIS — L918 Other hypertrophic disorders of the skin: Secondary | ICD-10-CM | POA: Diagnosis not present

## 2022-04-12 DIAGNOSIS — L298 Other pruritus: Secondary | ICD-10-CM | POA: Diagnosis not present

## 2022-04-13 ENCOUNTER — Ambulatory Visit (INDEPENDENT_AMBULATORY_CARE_PROVIDER_SITE_OTHER): Payer: PPO | Admitting: *Deleted

## 2022-04-13 DIAGNOSIS — I10 Essential (primary) hypertension: Secondary | ICD-10-CM

## 2022-04-13 DIAGNOSIS — F0153 Vascular dementia, unspecified severity, with mood disturbance: Secondary | ICD-10-CM

## 2022-04-13 NOTE — Patient Instructions (Addendum)
Visit Information  Thank you for taking time to visit with me today. Please don't hesitate to contact me if I can be of assistance to you before our next scheduled telephone appointment.  Following are the goals we discussed today:  Patient will attend all scheduled provider appointments as evidenced by clinician review of documented attendance to scheduled appointments and patient/caregiver report Patient will continue to perform ADL's independently as evidenced by patient/caregiver report Patient will call provider office for new concerns or questions as evidenced by review of documented incoming telephone call notes and patient report Check blood pressure weekly Write blood pressure results in a log for provider review Keep medicine where it is safe or locked Do not drive; do not drink and drive Use appliances that have an auto shut-off feature  Keep patient active during the day; do more crossword puzzle Limit naps throughout the day Do one enjoyable thing every day; laugh; watch a funny movie or comedian Eat healthy Get outdoors every day (weather permitting) Spend time or talk with others at least 2 to 3 times per week Continue with AA and wearing CPAP Work with CSW for Adult Day Care options and other resources for patient  Our next appointment is by telephone on 7/19 at 1100  Please call the care guide team at [phone] if you need to cancel or reschedule your appointment.   If you are experiencing a Mental Health or [place] or need someone to talk to, please call the Suicide and Crisis Lifeline: 988 call the [place] National Suicide Prevention Lifeline: 6175816311 or TTY: 534-397-2667 TTY 443 030 0955) to talk to a trained counselor call 1-800-273-TALK (toll free, 24 hour hotline) call 911   The patient verbalized understanding of instructions, educational materials, and care plan provided today and DECLINED offer to receive copy of patient instructions,  educational materials, and care plan.   Hubert Azure RN, MSN RN Care Management Coordinator [place] [phone] [email or web address]
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
## 2022-04-15 ENCOUNTER — Other Ambulatory Visit: Payer: Self-pay | Admitting: Gastroenterology

## 2022-04-15 DIAGNOSIS — F411 Generalized anxiety disorder: Secondary | ICD-10-CM

## 2022-04-15 DIAGNOSIS — F32A Depression, unspecified: Secondary | ICD-10-CM | POA: Diagnosis not present

## 2022-04-15 DIAGNOSIS — R0681 Apnea, not elsewhere classified: Secondary | ICD-10-CM | POA: Diagnosis not present

## 2022-04-15 DIAGNOSIS — G4733 Obstructive sleep apnea (adult) (pediatric): Secondary | ICD-10-CM | POA: Diagnosis not present

## 2022-04-15 DIAGNOSIS — F0153 Vascular dementia, unspecified severity, with mood disturbance: Secondary | ICD-10-CM

## 2022-04-15 DIAGNOSIS — I10 Essential (primary) hypertension: Secondary | ICD-10-CM | POA: Diagnosis not present

## 2022-04-15 DIAGNOSIS — G4700 Insomnia, unspecified: Secondary | ICD-10-CM | POA: Diagnosis not present

## 2022-04-17 IMAGING — CT CT CHEST W/O CM
2 of 4 series · 15 of 36 positions shown, 18 images · non-contrast
Comparison: Chest radiograph dated 07/16/2016 and CT of the abdomen
pelvis dated 06/25/2015.

CLINICAL DATA: 60-year-old male with pulmonary nodule follow-up.

EXAM:
CT CHEST WITHOUT CONTRAST
TECHNIQUE: Multidetector CT imaging of the chest was performed following the
standard protocol without IV contrast.

[Series 2: chest 2.00 · axial · 0.80mm/px · z∈[-1208,-938]mm · 12 of 161 slices shown, 15 images]
[im 13/161  mediastinal]
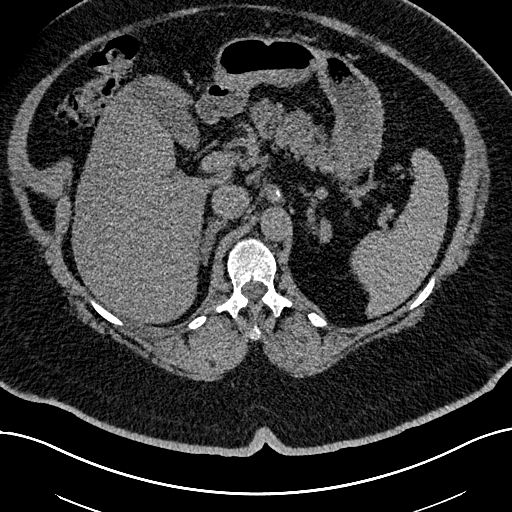
[im 13/161  lung]
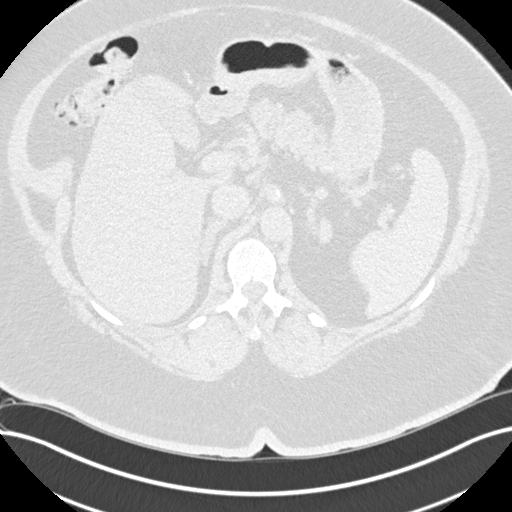
[im 25/161  lung]
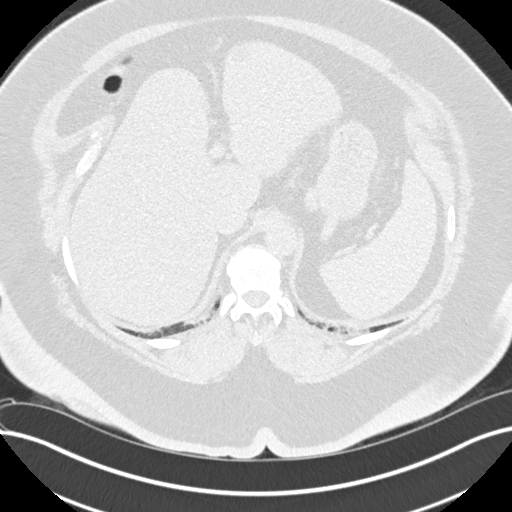
[im 37/161  lung]
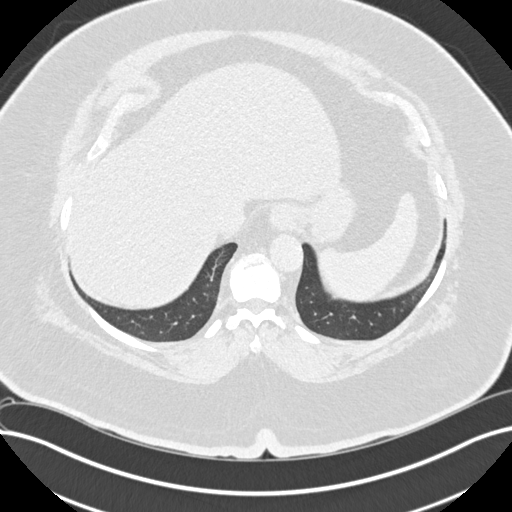
[im 50/161  lung]
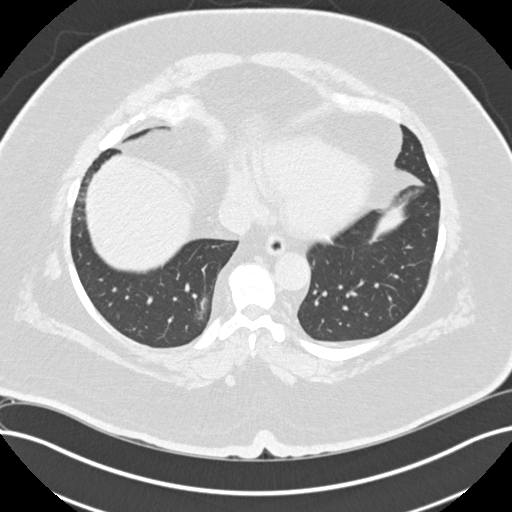
[im 62/161  mediastinal]
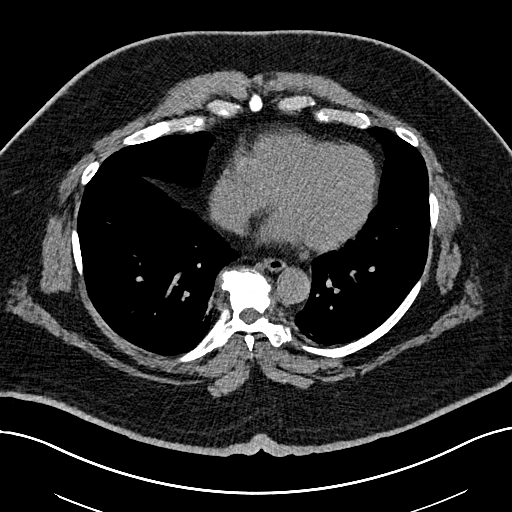
[im 62/161  lung]
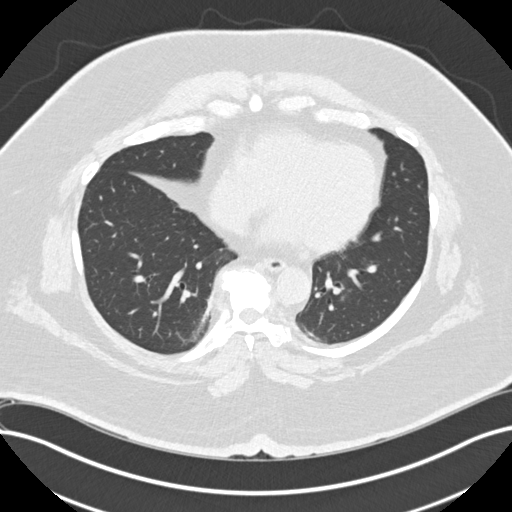
[im 74/161  lung]
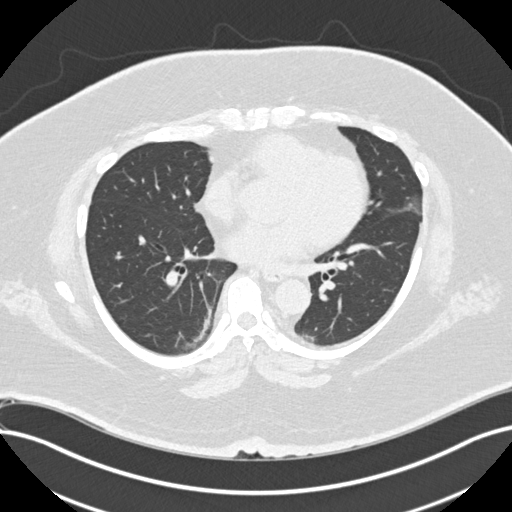
[im 87/161  lung]
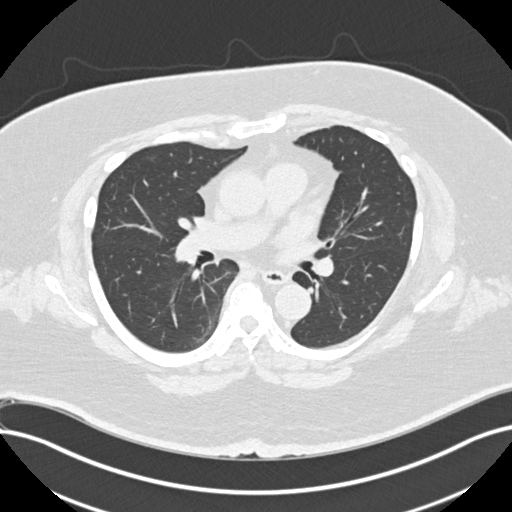
[im 99/161  lung]
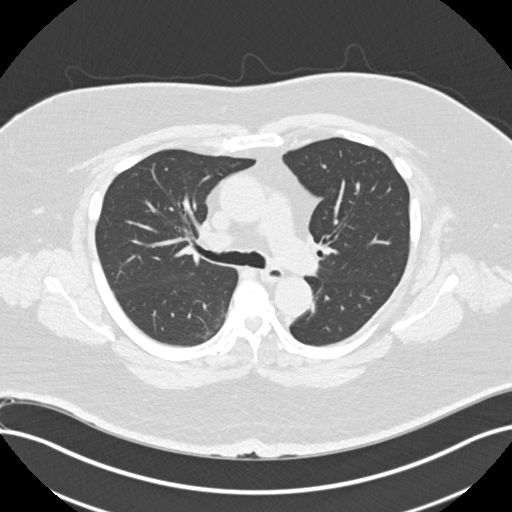
[im 111/161  mediastinal]
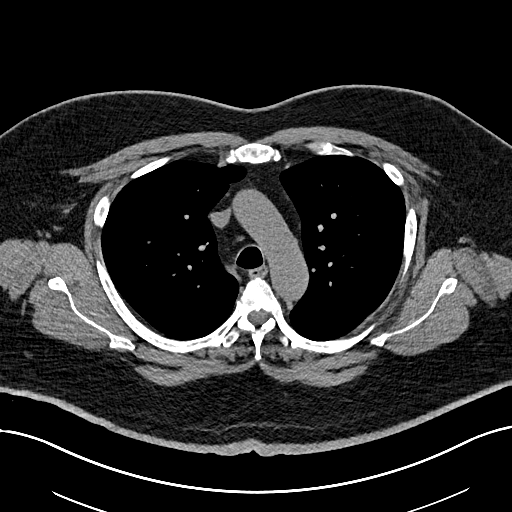
[im 111/161  lung]
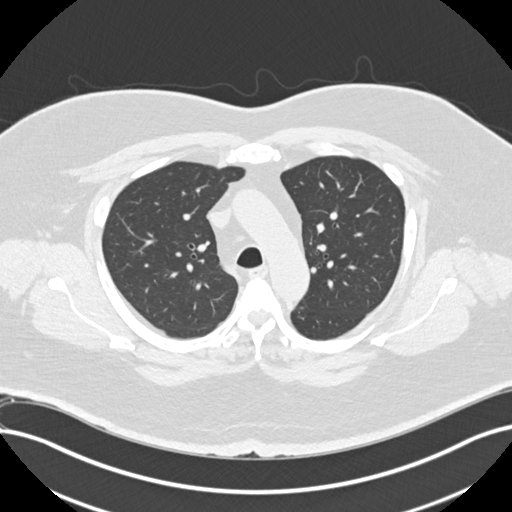
[im 124/161  lung]
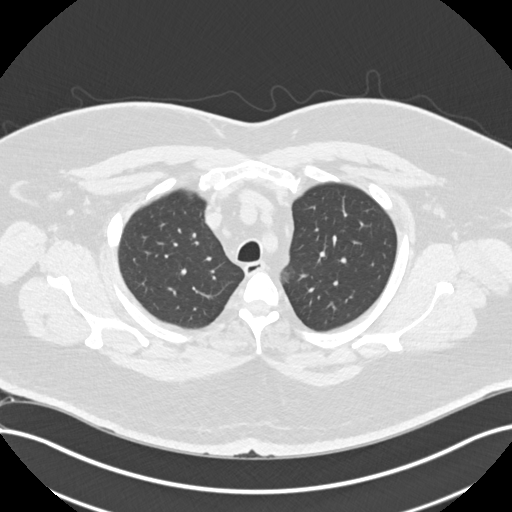
[im 136/161  lung]
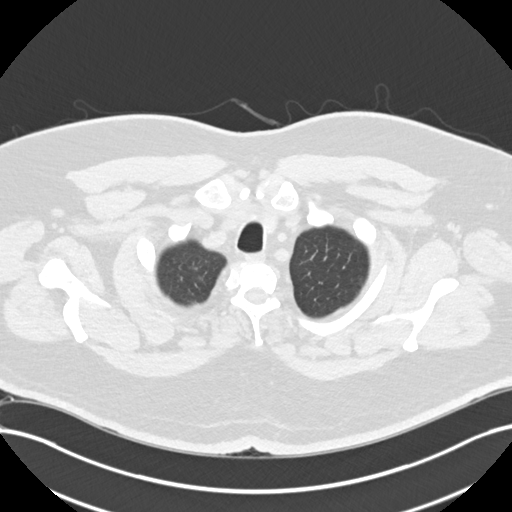
[im 148/161  lung]
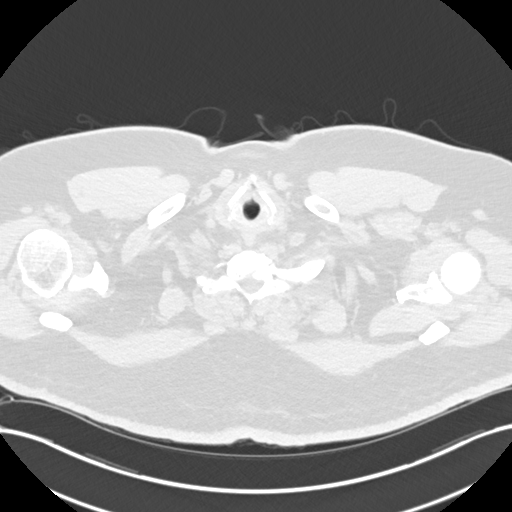

[Series 5: coronals chest 2.00 cor · coronal · 0.63mm/px · 3 of 187 slices shown]
[im 38/187  lung]
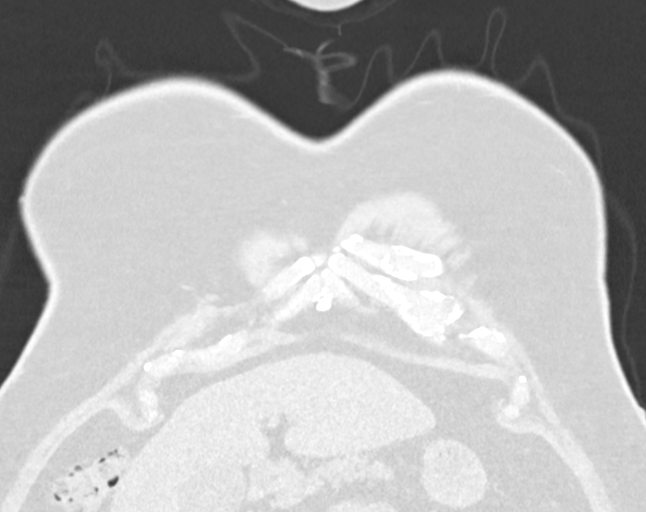
[im 75/187  lung]
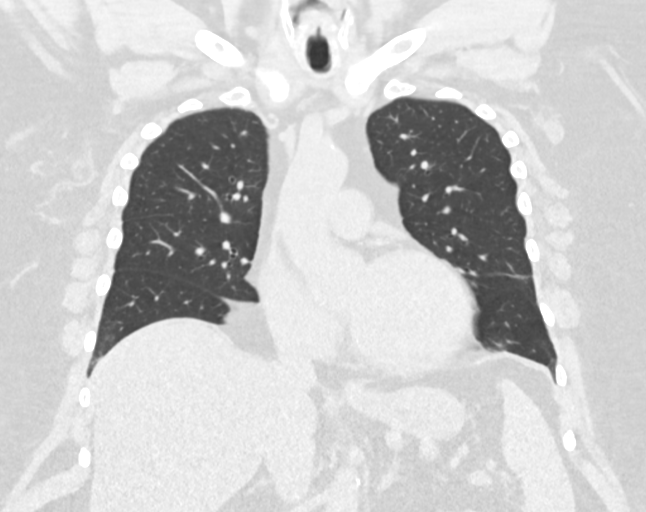
[im 112/187  lung]
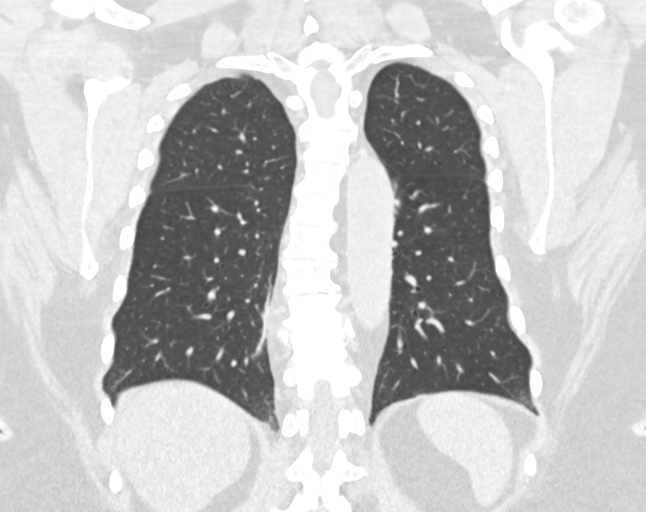

[15 of 36 positions shown; findings below may reference images not displayed]

FINDINGS: Evaluation of this exam is limited in the absence of intravenous
contrast.

Cardiovascular: There is no cardiomegaly or pericardial effusion.
There is coronary vascular calcification of the LAD. Mild
atherosclerotic calcification of the thoracic aorta. No aneurysmal
dilatation. The central pulmonary arteries are grossly unremarkable
on this noncontrast CT.

Mediastinum/Nodes: There is no hilar or mediastinal adenopathy. The
esophagus and the thyroid gland are grossly unremarkable. No
mediastinal fluid collection.

Lungs/Pleura: Punctate right lung base subpleural nodule (105/3)
similar to the study of 6564. No further follow-up recommended.
Minimal bilateral lower lobe subpleural atelectasis/scarring. No
focal consolidation, pleural effusion, or pneumothorax. The central
airways are patent.

Upper Abdomen: Fatty liver.  Scattered colonic diverticula.

Musculoskeletal: Degenerative changes of the spine. No acute osseous
pathology.
IMPRESSION: 1. No acute intrathoracic pathology. Stable punctate right lower
lobe subpleural nodule. No further follow-up advised.
2. Coronary vascular calcification of the LAD.
3. Fatty liver.
4. Aortic Atherosclerosis ([id number]-[id number]).

## 2022-04-25 ENCOUNTER — Ambulatory Visit: Payer: Self-pay | Admitting: *Deleted

## 2022-04-25 ENCOUNTER — Telehealth: Payer: Self-pay | Admitting: *Deleted

## 2022-04-25 NOTE — Progress Notes (Signed)
This encounter was created in error - please disregard.

## 2022-04-25 NOTE — Addendum Note (Signed)
Addended by: Vern Claude on: 04/25/2022 02:41 PM   Modules accepted: Orders, Level of Service

## 2022-04-25 NOTE — Chronic Care Management (AMB) (Deleted)
   04/25/2022  Paul Cook 05/24/1951 [id number]  Phone call from [facility] wishing to confirm patient's interest in respite care. Contact information provided for patient and patient's spouse to be contacted to confirm.  [address] [place] [phone]

## 2022-04-25 NOTE — Telephone Encounter (Signed)
   04/25/2022  Aztlan Coll 1951/08/03 [id number]    Phone call from [facility] wishing to confirm patient's interest in respite care. Contact information provided for patient and patient's spouse to be contacted to confirm.  [address], [place] [phone]

## 2022-04-28 ENCOUNTER — Other Ambulatory Visit: Payer: Self-pay | Admitting: Internal Medicine

## 2022-04-28 DIAGNOSIS — G2581 Restless legs syndrome: Secondary | ICD-10-CM

## 2022-05-04 ENCOUNTER — Ambulatory Visit (INDEPENDENT_AMBULATORY_CARE_PROVIDER_SITE_OTHER): Payer: PPO | Admitting: *Deleted

## 2022-05-04 ENCOUNTER — Telehealth: Payer: Self-pay | Admitting: Internal Medicine

## 2022-05-04 DIAGNOSIS — I10 Essential (primary) hypertension: Secondary | ICD-10-CM

## 2022-05-04 DIAGNOSIS — F0153 Vascular dementia, unspecified severity, with mood disturbance: Secondary | ICD-10-CM

## 2022-05-04 NOTE — Chronic Care Management (AMB) (Signed)
Care Management    RN Visit Note  05/04/2022 Name: Paul Cook MRN: [id number] DOB: [birth year]  Subjective: Paul Cook is a 71 y.o. year old male who is a primary care patient of McLean-Scocuzza, [name], MD. The care management team was consulted for assistance with disease management and care coordination needs.    Engaged with patient by telephone for follow up visit in response to provider referral for case management and/or care coordination services.   Consent to Services:   Paul Cook was given information about Care Management services today including:  Care Management services includes personalized support from designated clinical staff supervised by his physician, including individualized plan of care and coordination with other care providers 24/7 contact phone numbers for assistance for urgent and routine care needs. The patient may stop case management services at any time by phone call to the office staff.  Patient agreed to services and consent obtained.   Assessment: Review of patient past medical history, allergies, medications, health status, including review of consultants reports, laboratory and other test data, was performed as part of comprehensive evaluation and provision of chronic care management services.   SDOH (Social Determinants of Health) assessments and interventions performed:    Care Plan  Allergies  Allergen Reactions   Nsaids Other (See Comments)    GI Issues    Outpatient Encounter Medications as of 05/04/2022  Medication Sig Note   amitriptyline (ELAVIL) 10 MG tablet TAKE 2 TABLETS BY MOUTH AT BEDTIME    amLODipine (NORVASC) 5 MG tablet 1 tablet    ASPIRIN LOW DOSE 81 MG EC tablet TAKE 1 TABLET BY MOUTH ONCE DAILY    ezetimibe (ZETIA) 10 MG tablet Take 1 tablet (10 mg total) by mouth daily.    gabapentin (NEURONTIN) 300 MG capsule Take 1 capsule (300 mg total) by mouth daily.    gabapentin (NEURONTIN) 600 MG tablet TAKE 1 TABLET(600 MG) BY  MOUTH THREE TIMES DAILY 07/20/2021: 300 mg QPM   lisinopril-hydrochlorothiazide (ZESTORETIC) 20-12.5 MG tablet Take 2 tablets by mouth daily.    metoprolol tartrate (LOPRESSOR) 25 MG tablet Take 1 tablet (25 mg total) by mouth 2 (two) times daily. D/c xl due to cost    rOPINIRole (REQUIP) 2 MG tablet TAKE 3 TABLETS(6 MG) BY MOUTH AT BEDTIME    solifenacin (VESICARE) 5 MG tablet Take 1 tablet (5 mg total) by mouth daily.    No facility-administered encounter medications on file as of 05/04/2022.    Patient Active Problem List   Diagnosis Date Noted   Facet arthritis, degenerative, lumbar spine 10/12/2021   Posterior tibial tendinitis of right lower extremity 10/12/2021   LVH (left ventricular hypertrophy) 10/12/2021   Overactive bladder 10/12/2021   Daytime hypersomnolence 10/12/2021   Urine incontinence 07/28/2021   OCD (obsessive compulsive disorder) 07/04/2021   Elevated liver enzymes 07/04/2021   Vascular dementia with depressed mood ([place]) 07/02/2021   DOE (dyspnea on exertion) 08/06/2020   Abnormal EKG 08/06/2020   Coronary artery calcification seen on CT scan 07/13/2020   Coronary atherosclerosis due to calcified coronary lesion 06/10/2020   Aortic atherosclerosis ([place]) 06/10/2020   Lung nodule seen on imaging study 06/10/2020   Cyst of left kidney 05/29/2020   Gallstone 05/29/2020   Fatty liver 05/29/2020   Bilateral leg edema 05/15/2020   Venous stasis dermatitis of right lower extremity 05/15/2020   Right ankle swelling 12/17/2019   HLD (hyperlipidemia) 11/09/2018   History of alcohol abuse 09/06/2018   Chronic pain of right  ankle 09/06/2018   Vitamin D deficiency 06/29/2018   Prediabetes 06/28/2018   Plantar fasciitis 08/29/2016   GERD (gastroesophageal reflux disease) 12/18/2015   Restless legs 03/19/2015   Essential hypertension 01/14/2014   Obstructive sleep apnea 12/17/2013   IBS (irritable bowel syndrome) 07/30/2013   Chronic insomnia 06/12/2013   Cervical disc  disorder with radiculopathy of cervical region 06/12/2013   Morbid obesity with BMI of 50.0-59.9, adult ([place]) 06/12/2013   Seasonal allergies 06/12/2013   Anxiety and depression 06/12/2013    Conditions to be addressed/monitored: HTN and Dementia  Care Plan : CCM RNCM Care Plan    RESOLVING DUE TO DUPLICATE GOALS/CHANGING TO CARE COORDINATION  Updates made by Leona Singleton, RN since 05/04/2022 12:00 AM     Problem: Increasing forgetfulness related to dementia causing dificulties with self cae management of hypertension Resolved 05/04/2022  Priority: Medium  Note:   RESOLVING DUE TO DUPLICATE GOALS/CHANGING TO CARE COORDINATION    Long-Range Goal: Patient and wife will work with CCM team to assist in learning more about dementia to help self care management of cormobidities Completed 05/04/2022  Start Date: 07/09/2021  Expected End Date: 03/02/2023  Recent Progress: Not on track  Priority: Medium  Note:   RESOLVING DUE TO DUPLICATE GOALS/CHANGING TO CARE COORDINATION GOALS  Current Barriers:  Knowledge Deficits related to plan of care for management of HTN and Dementia  Care Coordination needs related to Cognitive Deficits, Memory Deficits, and assistance affording medications  Cognitive Deficits;   History of hypertension, monitors blood pressures at home occasionally.    3/8--wife reports patient doing a little better.  Has obtained his CPAP and is sleeping better.  States he is now going to [place] to help  4/19--Wife reports recent episode of patient driving under the influence and getting lost for 5 hours.  States after that episode she took his car keys.  States her medical conditions are getting out of control and she would like some assistance with patient.  Patient does not qualify for medicaid per wife and they cannot afford to private pay home aides.  CSW Chrystal requested to contact wife Adult Day Care options and other resources.  5/17--wife reports she is ill with the flu  but patient is fine, no flu like symptoms.  State continues to keep his keys hidden from him and does not supply him with alcohol.  Is working with CCM CSW for possible respite care for patient.  Reports blood pressures have been much with recent ranges of 110-140/70-80's.  Wife does state they are having difficulties affording his medications, especially the myrbetriq  (wife was looking for possible alternative); discussed [place] referral for medication recocilliation and medication assistance.  Wife states patients meds are over $150 a month and she would like to see if that can be reduced with substition of medications that are more affordable  6/28--spoke with wife briefly as she was driving.  States patient is doing fair.  Has spoken with pharmacy and is happy/satisfied with medication changes and cost reduction.  Still working with Education officer, museum for [employer] assistance.  RNCM Clinical Goal(s):  Patient will verbalize understanding of plan for management of HTN and Dementia  take all medications exactly as prescribed and will call provider for medication related questions continue to work with [place] to address care management and care coordination needs related to HTN and Dementia  work with Education officer, museum to address [place] Concerns , Cognitive Deficits, and Memory Deficits related  to the management of Dementia through collaboration with RN Care manager, provider, and care team.  Wife will report monitoring blood pressure at home weekly within the next 45 days.  Interventions: 1:1 collaboration with primary care provider regarding development and update of comprehensive plan of care as evidenced by provider attestation and co-signature Inter-disciplinary care team collaboration (see longitudinal plan of care) Evaluation of current treatment plan related to  self management and patient's adherence to plan as established by provider Encouraged no alcohol and continue  attend AA  Dementia  (Status: Goal on Track (progressing): YES.) Evaluation of current treatment plan related to Dementia, Limited education about Dementia*, Cognitive Deficits, and Memory Deficits self-management and patient's adherence to plan as established by provider. Provided education to patient re: Dementia; Discussed plans with patient for ongoing care management follow up and provided patient with direct contact information for care management team; Advised patient to discuss diagnosis and medications with provider; Assessed social determinant of health barriers;  Discussed with wife patient should not be drinking and driving; encouraged wife to discuss driving with patient and Neurologist Discussed dangers of alcohol with dementia and driving and encouraged wife to discuss with providers Encouraged continue working with Social Work for respite care options  Hypertension: (Status: Goal on Track (progressing): YES. Condition stable. Not addressed this visit.) Last practice recorded BP readings:  BP Readings from Last 3 Encounters:  10/20/21 115/63  10/12/21 130/70  08/31/21 125/79  Most recent eGFR/CrCl: No results found for: "EGFR"  No components found for: "CRCL"  Evaluation of current treatment plan related to hypertension self management and patient's adherence to plan as established by provider;   Reviewed medications with patient and discussed importance of compliance;  Advised patient, providing education and rationale, to monitor blood pressure daily and record, calling PCP for findings outside established parameters;  Discussed complications of poorly controlled blood pressure such as heart disease, stroke, circulatory complications, vision complications, kidney impairment, sexual dysfunction;  Encouraged to continue to take and record blood pressures at least weekly and record for provider review  Discussed low salt heart healthy diet  Patient Goals/Self-Care  Activities: Patient will attend all scheduled provider appointments as evidenced by clinician review of documented attendance to scheduled appointments and patient/caregiver report Patient will continue to perform ADL's independently as evidenced by patient/caregiver report Patient will call provider office for new concerns or questions as evidenced by review of documented incoming telephone call notes and patient report Check blood pressure weekly Write blood pressure results in a log for provider review Keep medicine where it is safe or locked Do not drive; do not drink and drive Use appliances that have an auto shut-off feature  Keep patient active during the day; do more crossword puzzle Limit naps throughout the day Do one enjoyable thing every day; laugh; watch a funny movie or comedian Eat healthy Get outdoors every day (weather permitting) Spend time or talk with others at least 2 to 3 times per week Continue with AA and wearing CPAP Work with CSW for Adult Day Care options and other resources for patient       Goals Addressed             This Visit's Progress    (THN)  MAKE PCP FOLLOW UP APPOINTMENT       Care Coordination Interventions: Advised patient to [place] Reviewed medications with patient and discussed MEDICATION COMPLIANCE Collaborated with OFFICE MANAGER regarding PCP FOLLOW UP APPOINTMENT  Plan: The care management team will reach out to the patient again over the next 15 days. WITH CARE COORDINATION TEAM.  Hubert Azure RN, MSN RN Care Management Coordinator [place] [phone] [email or web address]

## 2022-05-04 NOTE — Telephone Encounter (Signed)
Patient called and would like to know if Dr Derrel Nip will take him as a patient.

## 2022-05-04 NOTE — Patient Instructions (Signed)
Visit Information  Thank you for taking time to visit with me today. Please don't hesitate to contact me if I can be of assistance to you.   Following are the goals we discussed today:   Goals Addressed             This Visit's Progress    (THN)  MAKE PCP FOLLOW UP APPOINTMENT       Care Coordination Interventions: Advised patient to [place] BY SOCIAL WORKER Reviewed medications with patient and discussed MEDICATION COMPLIANCE Collaborated with OFFICE MANAGER regarding PCP FOLLOW UP APPOINTMENT           Our next appointment is by telephone on 7/25  at 1030  Please call the care guide team at [phone] if you need to cancel or reschedule your appointment.   If you are experiencing a Mental Health or [place] or need someone to talk to, please call the Suicide and Crisis Lifeline: 988 call the [place] National Suicide Prevention Lifeline: 323 091 3359 or TTY: 236-233-2385 TTY 807 013 8246) to talk to a trained counselor call 1-800-273-TALK (toll free, 24 hour hotline) call 911  The patient verbalized understanding of instructions, educational materials, and care plan provided today and DECLINED offer to receive copy of patient instructions, educational materials, and care plan.   The care management team will reach out to the patient again over the next 15 days.

## 2022-05-06 ENCOUNTER — Telehealth: Payer: Self-pay | Admitting: Internal Medicine

## 2022-05-06 NOTE — Telephone Encounter (Signed)
Pt called in requesting for refill on medication (lorazepam)... Medication is not on pt medication list...  Pt stated that he had this medication prescribe to him in 2021.... Pt requesting callback

## 2022-05-09 ENCOUNTER — Ambulatory Visit: Payer: Self-pay | Admitting: *Deleted

## 2022-05-09 DIAGNOSIS — I10 Essential (primary) hypertension: Secondary | ICD-10-CM

## 2022-05-09 DIAGNOSIS — F0153 Vascular dementia, unspecified severity, with mood disturbance: Secondary | ICD-10-CM

## 2022-05-09 NOTE — Chronic Care Management (AMB) (Addendum)
Chronic Care Management    Clinical Social Work Note  05/09/2022 Name: Paul Cook MRN: [id number] DOB: Sep 18, 1951  Paul Cook is a 71 y.o. year old male who is a primary care patient of McLean-Scocuzza, [name], MD. The CCM team was consulted to assist the patient with chronic disease management and/or care coordination needs related to: [employer] .   Collaboration with patient's spouse  for follow up visit in response to provider referral for social work chronic care management and care coordination services.   Consent to Services:  The patient was given information about Chronic Care Management services, agreed to services, and gave verbal consent prior to initiation of services.  Please see initial visit note for detailed documentation.   Patient agreed to services and consent obtained.   Assessment: Review of patient past medical history, allergies, medications, and health status, including review of relevant consultants reports was performed today as part of a comprehensive evaluation and provision of chronic care management and care coordination services.     SDOH (Social Determinants of Health) assessments and interventions performed:    Advanced Directives Status: Not addressed in this encounter.  CCM Care Plan  Allergies  Allergen Reactions   Nsaids Other (See Comments)    GI Issues    Outpatient Encounter Medications as of 05/09/2022  Medication Sig Note   amitriptyline (ELAVIL) 10 MG tablet TAKE 2 TABLETS BY MOUTH AT BEDTIME    amLODipine (NORVASC) 5 MG tablet 1 tablet    ASPIRIN LOW DOSE 81 MG EC tablet TAKE 1 TABLET BY MOUTH ONCE DAILY    ezetimibe (ZETIA) 10 MG tablet Take 1 tablet (10 mg total) by mouth daily.    gabapentin (NEURONTIN) 300 MG capsule Take 1 capsule (300 mg total) by mouth daily.    gabapentin (NEURONTIN) 600 MG tablet TAKE 1 TABLET(600 MG) BY MOUTH THREE TIMES DAILY 07/20/2021: 300 mg QPM   lisinopril-hydrochlorothiazide (ZESTORETIC)  20-12.5 MG tablet Take 2 tablets by mouth daily.    metoprolol tartrate (LOPRESSOR) 25 MG tablet Take 1 tablet (25 mg total) by mouth 2 (two) times daily. D/c xl due to cost    rOPINIRole (REQUIP) 2 MG tablet TAKE 3 TABLETS(6 MG) BY MOUTH AT BEDTIME    solifenacin (VESICARE) 5 MG tablet Take 1 tablet (5 mg total) by mouth daily.    No facility-administered encounter medications on file as of 05/09/2022.    Patient Active Problem List   Diagnosis Date Noted   Facet arthritis, degenerative, lumbar spine 10/12/2021   Posterior tibial tendinitis of right lower extremity 10/12/2021   LVH (left ventricular hypertrophy) 10/12/2021   Overactive bladder 10/12/2021   Daytime hypersomnolence 10/12/2021   Urine incontinence 07/28/2021   OCD (obsessive compulsive disorder) 07/04/2021   Elevated liver enzymes 07/04/2021   Vascular dementia with depressed mood ([place]) 07/02/2021   DOE (dyspnea on exertion) 08/06/2020   Abnormal EKG 08/06/2020   Coronary artery calcification seen on CT scan 07/13/2020   Coronary atherosclerosis due to calcified coronary lesion 06/10/2020   Aortic atherosclerosis ([place]) 06/10/2020   Lung nodule seen on imaging study 06/10/2020   Cyst of left kidney 05/29/2020   Gallstone 05/29/2020   Fatty liver 05/29/2020   Bilateral leg edema 05/15/2020   Venous stasis dermatitis of right lower extremity 05/15/2020   Right ankle swelling 12/17/2019   HLD (hyperlipidemia) 11/09/2018   History of alcohol abuse 09/06/2018   Chronic pain of right ankle 09/06/2018   Vitamin D deficiency 06/29/2018   Prediabetes 06/28/2018  Plantar fasciitis 08/29/2016   GERD (gastroesophageal reflux disease) 12/18/2015   Restless legs 03/19/2015   Essential hypertension 01/14/2014   Obstructive sleep apnea 12/17/2013   IBS (irritable bowel syndrome) 07/30/2013   Chronic insomnia 06/12/2013   Cervical disc disorder with radiculopathy of cervical region 06/12/2013   Morbid obesity with BMI of  50.0-59.9, adult ([place]) 06/12/2013   Seasonal allergies 06/12/2013   Anxiety and depression 06/12/2013    Conditions to be addressed/monitored: HTN and Dementia; Inability to perform IADL's independently  Care Plan : General Social Work (Adult)  Updates made by [employer], Darla Lesches, LCSW since 05/09/2022 12:00 AM     Problem: CHL AMB "PATIENT-SPECIFIC PROBLEM"   Note:   CARE PLAN ENTRY (see longitudinal plan of care for additional care plan information)  Current Barriers:  Patient with Dementia in need of assistance with connection to community resources  Knowledge deficits and need for support, education and care coordination related to community resources support  Memory Deficits  Clinical Goal(s)  Over the next 90 days, patient's spouse will work with care management team member to address concerns related to respite and additional caregiver support  Interventions provided by LCSW:  Acute call to patient's spouse to confirm referral for respite care and need to call [place] to confirm interest Patient's spouse confirmed that she has received a call from [facility] program  but has not been able to call them back Reinforced need to contact [place] to prevent patient's name from being removed from the list-they have begun to take new referrals as of  July 1 for their respite program Patient agreeable to contacting the respite care program through the [facility] to confirm continued interest Follow up appointment with new care provider scheduled for 05/10/22, patient's spouse agreeable to call from [facility] scheduled for 05/11/22 Continued encouragement provided for follow up with the community resources provided-no additional community resource needs requested at this time  Patient Self Care Activities & Deficits:  Patient is unable to independently navigate community resource options without care coordination support  Acknowledges  deficits and is motivated to resolve concern  Patient is able to contact [facility]  as discussed today Unable to independently due to memory deficits Attends all scheduled provider appointments  Please see past updates related to this goal by clicking on the "Past Updates" button in the selected goal         Follow Up Plan:  Patient's spouse will follow up with [place], [place] [phone]

## 2022-05-09 NOTE — Patient Instructions (Signed)
Visit Information  Thank you for taking time to visit with me today. Please don't hesitate to contact me if I can be of assistance to you before our next scheduled telephone appointment.  Following are the goals we discussed today:  Patient's spouse will contact [place] program to confirm interest   Please call the care guide team at [phone] if you need to cancel or reschedule your appointment.   If you are experiencing a Mental Health or [place] or need someone to talk to, please call the Suicide and Crisis Lifeline: 988   Patient verbalizes understanding of instructions and care plan provided today and agrees to view in [place]. Active MyChart status and patient understanding of how to access instructions and care plan via MyChart confirmed with patient.     No further follow up required: patient's spouse to contact [place], [place] [phone]

## 2022-05-09 NOTE — Telephone Encounter (Signed)
Spoke to Patient to let them know that Dr. Derrel Nip can not take him on but that Dr. Volanda Napoleon can take him on.

## 2022-05-11 ENCOUNTER — Encounter: Payer: Self-pay | Admitting: Family Medicine

## 2022-05-11 ENCOUNTER — Ambulatory Visit (INDEPENDENT_AMBULATORY_CARE_PROVIDER_SITE_OTHER): Payer: PPO | Admitting: Family Medicine

## 2022-05-11 VITALS — BP 114/68 | HR 74 | Temp 99.7°F | Ht 66.0 in | Wt 306.0 lb

## 2022-05-11 DIAGNOSIS — E785 Hyperlipidemia, unspecified: Secondary | ICD-10-CM

## 2022-05-11 DIAGNOSIS — Z6841 Body Mass Index (BMI) 40.0 and over, adult: Secondary | ICD-10-CM

## 2022-05-11 DIAGNOSIS — R7303 Prediabetes: Secondary | ICD-10-CM

## 2022-05-11 DIAGNOSIS — I10 Essential (primary) hypertension: Secondary | ICD-10-CM

## 2022-05-11 DIAGNOSIS — N3281 Overactive bladder: Secondary | ICD-10-CM

## 2022-05-11 MED ORDER — SHINGRIX 50 MCG/0.5ML IM SUSR: 0.5000 mL | Freq: Once | INTRAMUSCULAR | 0 refills | Status: AC

## 2022-05-11 NOTE — Patient Instructions (Signed)
It was a pleasure meeting you today. Thank you for allowing me to take part in your health care.  Our goals for today as we discussed include:  We will get some labs today.  If they are abnormal or we need to do something about them, I will call you.  If they are normal, I will send you a message on MyChart (if it is active) or a letter in the mail.  If you don't hear from [place] in 2 weeks, please call the office at the number below.    Recommend Shingles vaccine.  This is a 2 dose series and can be given at your local pharmacy.  Please talk to your pharmacist about this.   Please follow-up with PCP in 6 months  If you have any questions or concerns, please do not hesitate to call the office at [phone].  I look forward to our next visit and until then take care and stay safe.  Regards,   Carollee Leitz, MD   [facility]

## 2022-05-11 NOTE — Progress Notes (Signed)
    SUBJECTIVE:   CHIEF COMPLAINT / HPI: Transfer of care  Patient presents to clinic with his wife today. No acute concerns.  Hypertension Asymptomatic.  Compliant with current medication Zestoretic, Norvasc and metoprolol.  Hyperlipidemia Not currently on a statin.  Does not recall if was previously on statin therapy.  Taking Zetia daily.  Urinary incontinence Currently on Vesicare and reports tolerating medication well. . Vascular dementia Lives with wife who is present today.  She reports things are going well.  No decrease in mentation.   PERTINENT  PMH / PSH:  Hypertension CAD OSA Overactive bladder Class III obesity Vitamin D deficiency Hyperlipidemia   OBJECTIVE:   BP 114/68 (BP Location: Left Arm, Patient Position: Sitting, Cuff Size: Normal)   Pulse 74   Temp 99.7 F (37.6 C) (Oral)   Ht '5\' 6"'$  (1.676 m)   Wt (!) 306 lb (138.8 kg)   SpO2 98%   BMI 49.39 kg/m    General: Alert, no acute distress Cardio: Normal S1 and S2, RRR, no r/m/g Pulm: CTAB, normal work of breathing Abdomen: Bowel sounds normal. Abdomen soft, obese and non-tender.  Extremities: Trace bilateral lower extremity peripheral edema.    ASSESSMENT/PLAN:   Essential hypertension Normotensive. -CMET today -Continue amlodipine 10 mg daily -Continue Zestoretic 20- 12.5 daily -Continue metoprolol 25 mg twice daily -Will adjust medications pending lab results -Follow-up in 6 months.  Prediabetes A1c today  HLD (hyperlipidemia) Currently on Zetia.  Last lipid panel 2022. -Repeat lipid panel today -Given increasing age will discuss initiation of statin therapy with patient at next visit. -Continue Zetia 10 mg daily -Follow-up in 6 months.  Overactive bladder Continue Vesicare 5 mg daily.   PDMP reviewed  Healthcare maintenance Prescription for Shingrix vaccine provided.  Carollee Leitz, MD [place]

## 2022-05-12 ENCOUNTER — Ambulatory Visit: Payer: Self-pay | Admitting: *Deleted

## 2022-05-12 LAB — COMPREHENSIVE METABOLIC PANEL
ALT: 98 U/L — ABNORMAL HIGH (ref 0–53)
AST: 66 U/L — ABNORMAL HIGH (ref 0–37)
Albumin: 4.5 g/dL (ref 3.5–5.2)
Alkaline Phosphatase: 64 U/L (ref 39–117)
BUN: 21 mg/dL (ref 6–23)
CO2: 30 mEq/L (ref 19–32)
Calcium: 9.4 mg/dL (ref 8.4–10.5)
Chloride: 99 mEq/L (ref 96–112)
Creatinine, Ser: 1.12 mg/dL (ref 0.40–1.50)
GFR: 66.48 mL/min (ref >60.00)
Glucose, Bld: 99 mg/dL (ref 70–99)
Potassium: 4 mEq/L (ref 3.5–5.1)
Sodium: 139 mEq/L (ref 135–145)
Total Bilirubin: 0.6 mg/dL (ref 0.2–1.2)
Total Protein: 6.9 g/dL (ref 6.0–8.3)

## 2022-05-12 LAB — LIPID PANEL
Cholesterol: 150 mg/dL (ref 0–200)
HDL: 41.4 mg/dL (ref >39.00)
LDL Cholesterol: 78 mg/dL (ref 0–99)
NonHDL: 108.39
Total CHOL/HDL Ratio: 4
Triglycerides: 152 mg/dL — ABNORMAL HIGH (ref 0.0–149.0)
VLDL: 30.4 mg/dL (ref 0.0–40.0)

## 2022-05-12 LAB — HEMOGLOBIN A1C: Hgb A1c MFr Bld: 5.8 % (ref 4.6–6.5)

## 2022-05-12 NOTE — Patient Instructions (Signed)
Visit Information  Thank you for taking time to visit with me today. Please don't hesitate to contact me if I can be of assistance to you.   Following are the goals we discussed today:   Goals Addressed             This Visit's Progress    COMPLETED: (THN)  MAKE PCP FOLLOW UP APPOINTMENT   On track    Care Coordination Interventions: Advised patient to [place] Reviewed medications with patient and discussed MEDICATION COMPLIANCE Collaborated with OFFICE MANAGER regarding PCP FOLLOW UP APPOINTMENT     7/27--SPOKE WITH WIFE, PATIENT ATTENDED [place] PCP WITHOUT COMPLAINTS.  WIFE KNOWS TO CALL [facility], STATES SHE HAS CALLED AND AWAITING CALL BACK   ---COMPLETING GOAL        Please call the care guide team at [phone] if you need to cancel or reschedule your appointment.   If you are experiencing a Mental Health or [place] or need someone to talk to, please call the Suicide and Crisis Lifeline: 988 call the [place] National Suicide Prevention Lifeline: 856-466-4911 or TTY: (830)130-4527 TTY (937)433-1224) to talk to a trained counselor call 1-800-273-TALK (toll free, 24 hour hotline) call 911  The patient verbalized understanding of instructions, educational materials, and care plan provided today and DECLINED offer to receive copy of patient instructions, educational materials, and care plan.   No further follow up required: as you agree patient needs have been met.  Please give u a call if needs or questions arise in future

## 2022-05-12 NOTE — Patient Outreach (Signed)
  Care Coordination   Follow Up Visit Note   05/12/2022 Name: Paul Cook MRN: [id number] DOB: 1951-04-24  Paul Cook is a 71 y.o. year old male who sees Carollee Leitz, MD for primary care. I spoke with  Gretta Cool WIFE  by phone today  What matters to the patients health and wellness today?  PCP FOLLOW APPOINTMENT   Goals Addressed             This Visit's Progress    COMPLETED: (THN)  MAKE PCP FOLLOW UP APPOINTMENT   On track    Care Coordination Interventions: Advised patient to [place] [facility] [employer] Reviewed medications with patient and discussed MEDICATION COMPLIANCE Collaborated with OFFICE MANAGER regarding PCP FOLLOW UP APPOINTMENT     7/27--SPOKE WITH WIFE, PATIENT ATTENDED [place] PCP WITHOUT COMPLAINTS.  WIFE KNOWS TO CALL [facility], STATES SHE HAS CALLED AND AWAITING CALL BACK   ---COMPLETING GOAL        SDOH assessments and interventions completed:   Yes   Care Coordination Interventions Activated:  Yes Care Coordination Interventions:  Yes, provided/REINFORCED NEED TO CONTACT [facility]PITE [facility] PROVIDED  Follow up plan: No further intervention required. Wife declines further questions, needs, or concerns.  Case being closed at this time.  Encounter Outcome:  Pt. Visit Completed  Hubert Azure RN, MSN RN Care Management Coordinator [place] [phone] [email or web address]

## 2022-05-16 ENCOUNTER — Other Ambulatory Visit: Payer: Self-pay | Admitting: Gastroenterology

## 2022-05-16 DIAGNOSIS — G4733 Obstructive sleep apnea (adult) (pediatric): Secondary | ICD-10-CM | POA: Diagnosis not present

## 2022-05-16 DIAGNOSIS — G4700 Insomnia, unspecified: Secondary | ICD-10-CM | POA: Diagnosis not present

## 2022-05-16 DIAGNOSIS — F0153 Vascular dementia, unspecified severity, with mood disturbance: Secondary | ICD-10-CM

## 2022-05-16 DIAGNOSIS — R0681 Apnea, not elsewhere classified: Secondary | ICD-10-CM | POA: Diagnosis not present

## 2022-05-16 DIAGNOSIS — I10 Essential (primary) hypertension: Secondary | ICD-10-CM

## 2022-05-16 DIAGNOSIS — F32A Depression, unspecified: Secondary | ICD-10-CM | POA: Diagnosis not present

## 2022-05-18 DIAGNOSIS — I10 Essential (primary) hypertension: Secondary | ICD-10-CM | POA: Diagnosis not present

## 2022-05-18 DIAGNOSIS — G4700 Insomnia, unspecified: Secondary | ICD-10-CM | POA: Diagnosis not present

## 2022-05-18 DIAGNOSIS — G4733 Obstructive sleep apnea (adult) (pediatric): Secondary | ICD-10-CM | POA: Diagnosis not present

## 2022-05-22 ENCOUNTER — Encounter: Payer: Self-pay | Admitting: Family Medicine

## 2022-05-22 NOTE — Assessment & Plan Note (Signed)
Currently on Zetia.  Last lipid panel 2022. -Repeat lipid panel today -Given increasing age will discuss initiation of statin therapy with patient at next visit. -Continue Zetia 10 mg daily -Follow-up in 6 months.

## 2022-05-22 NOTE — Assessment & Plan Note (Signed)
A1c today.  

## 2022-05-22 NOTE — Assessment & Plan Note (Signed)
Continue Vesicare 5 mg daily.

## 2022-05-22 NOTE — Assessment & Plan Note (Addendum)
Normotensive. -CMET today -Continue amlodipine 10 mg daily -Continue Zestoretic 20- 12.5 daily -Continue metoprolol 25 mg twice daily -Will adjust medications pending lab results -Follow-up in 6 months.

## 2022-05-28 ENCOUNTER — Other Ambulatory Visit: Payer: Self-pay | Admitting: Internal Medicine

## 2022-05-31 ENCOUNTER — Ambulatory Visit: Payer: PPO | Admitting: Podiatry

## 2022-05-31 DIAGNOSIS — M76821 Posterior tibial tendinitis, right leg: Secondary | ICD-10-CM

## 2022-05-31 DIAGNOSIS — Q666 Other congenital valgus deformities of feet: Secondary | ICD-10-CM | POA: Diagnosis not present

## 2022-06-07 NOTE — Progress Notes (Signed)
Subjective:  Patient ID: Paul Cook, male    DOB: 1951/03/18,  MRN: [id number]  Chief Complaint  Patient presents with   Foot Pain    71 y.o. male presents with the above complaint.  Patient presents for follow-up of right posterior tibial tendinitis pain again.  Patient states that started acting back up again came out of nowhere 1 to get it evaluated.  He has not been wearing orthotics he denies any other acute complaints would like to discuss treatment options.     Review of Systems: Negative except as noted in the HPI. Denies N/V/F/Ch.  Past Medical History:  Diagnosis Date   Adenomatous colon polyp    tubular   Adenomatous rectal polyp    tubular   Asthma    Depression    Diverticulosis    GERD (gastroesophageal reflux disease)    History of chicken pox    Hyperlipidemia    Hypertension    Lung nodule    CT 10/2014   OSA (obstructive sleep apnea)    Sleep apnea    wears cpap   Vitamin D deficiency     Current Outpatient Medications:    amitriptyline (ELAVIL) 10 MG tablet, TAKE 2 TABLETS BY MOUTH AT BEDTIME, Disp: 60 tablet, Rfl: 0   amLODipine (NORVASC) 5 MG tablet, TAKE 1 TABLET(5 MG) BY MOUTH DAILY, Disp: 90 tablet, Rfl: 0   ASPIRIN LOW DOSE 81 MG EC tablet, TAKE 1 TABLET BY MOUTH ONCE DAILY, Disp: 90 tablet, Rfl: 1   ezetimibe (ZETIA) 10 MG tablet, Take 1 tablet (10 mg total) by mouth daily., Disp: 90 tablet, Rfl: 3   gabapentin (NEURONTIN) 300 MG capsule, Take 1 capsule (300 mg total) by mouth daily., Disp: 90 capsule, Rfl: 3   lisinopril-hydrochlorothiazide (ZESTORETIC) 20-12.5 MG tablet, Take 2 tablets by mouth daily., Disp: 180 tablet, Rfl: 3   metoprolol tartrate (LOPRESSOR) 25 MG tablet, Take 1 tablet (25 mg total) by mouth 2 (two) times daily. D/c xl due to cost, Disp: 180 tablet, Rfl: 3   rOPINIRole (REQUIP) 2 MG tablet, TAKE 3 TABLETS(6 MG) BY MOUTH AT BEDTIME, Disp: 270 tablet, Rfl: 1   solifenacin (VESICARE) 5 MG tablet, Take 1 tablet (5 mg total) by  mouth daily., Disp: 90 tablet, Rfl: 3  Social History   Tobacco Use  Smoking Status Never   Passive exposure: Never  Smokeless Tobacco Never    Allergies  Allergen Reactions   Nsaids Other (See Comments)    GI Issues   Objective:  There were no vitals filed for this visit. There is no height or weight on file to calculate BMI. Constitutional Well developed. Well nourished.  Vascular Dorsalis pedis pulses palpable bilaterally. Posterior tibial pulses palpable bilaterally. Capillary refill normal to all digits.  No cyanosis or clubbing noted. Pedal hair growth normal.  Neurologic Normal speech. Oriented to person, place, and time. Epicritic sensation to light touch grossly present bilaterally.  Dermatologic Pain along the course of the posterior tibial tendon.  Pain with resisted plantarflexion inversion of the foot.  No pain with dorsiflexion eversion of the foot.  No pain at the Achilles tendon ATFL or peroneal tendon.  Gait examination shows pes planovalgus foot structure with calcaneovalgus to many toe signs partially but recruit the arch with dorsiflexion of the hallux.  Unable to perform single and double heel raise.  Orthopedic: Normal joint ROM without pain or crepitus bilaterally. No visible deformities. No bony tenderness.   Radiographs: None Assessment:   1. Posterior  tibial tendinitis, right   2. Pes planovalgus      Plan:  Patient was evaluated and treated and all questions answered.  Right posterior tibial tendinitis -External patient the etiology of tendinitis and various treatment options were discussed.  Given the amount of pain that is having I believe patient will benefit from steroid injection of decrease acute inflammatory component associated pain.  I discussed with him that since this is nearing tendon there is a risk of rupture associated with it.  Patient agrees with plan will elect to proceed with injection. -A steroid injection was performed at  right medial foot at point of maximal tenderness using 1% plain Lidocaine and 10 mg of Kenalog. This was well tolerated.  Pes planovalgus -I explained to the patient the etiology of pes planovalgus and various treatment options were extensively discussed.  Given the amount of pain that he is having I believe benefit from custom-made orthotics to control after motion support the arch of the foot take the stress away from the posterior tibial tendon.  Patient agrees with the plan. -He was casted for orthotics   No follow-ups on file.

## 2022-06-15 ENCOUNTER — Other Ambulatory Visit: Payer: Self-pay | Admitting: Gastroenterology

## 2022-06-16 ENCOUNTER — Other Ambulatory Visit: Payer: PPO

## 2022-06-16 DIAGNOSIS — F32A Depression, unspecified: Secondary | ICD-10-CM | POA: Diagnosis not present

## 2022-06-16 DIAGNOSIS — M76821 Posterior tibial tendinitis, right leg: Secondary | ICD-10-CM | POA: Diagnosis not present

## 2022-06-16 DIAGNOSIS — R0681 Apnea, not elsewhere classified: Secondary | ICD-10-CM | POA: Diagnosis not present

## 2022-06-16 DIAGNOSIS — I10 Essential (primary) hypertension: Secondary | ICD-10-CM | POA: Diagnosis not present

## 2022-06-16 DIAGNOSIS — G4733 Obstructive sleep apnea (adult) (pediatric): Secondary | ICD-10-CM | POA: Diagnosis not present

## 2022-06-16 DIAGNOSIS — G4700 Insomnia, unspecified: Secondary | ICD-10-CM | POA: Diagnosis not present

## 2022-06-16 DIAGNOSIS — Q666 Other congenital valgus deformities of feet: Secondary | ICD-10-CM | POA: Diagnosis not present

## 2022-06-30 ENCOUNTER — Ambulatory Visit: Payer: PPO | Admitting: Podiatry

## 2022-06-30 DIAGNOSIS — Q666 Other congenital valgus deformities of feet: Secondary | ICD-10-CM

## 2022-06-30 DIAGNOSIS — M76821 Posterior tibial tendinitis, right leg: Secondary | ICD-10-CM

## 2022-07-05 ENCOUNTER — Telehealth: Payer: Self-pay

## 2022-07-05 ENCOUNTER — Other Ambulatory Visit: Payer: Self-pay

## 2022-07-05 ENCOUNTER — Other Ambulatory Visit: Payer: Self-pay | Admitting: Internal Medicine

## 2022-07-05 DIAGNOSIS — M5010 Cervical disc disorder with radiculopathy, unspecified cervical region: Secondary | ICD-10-CM

## 2022-07-05 DIAGNOSIS — G2581 Restless legs syndrome: Secondary | ICD-10-CM

## 2022-07-05 MED ORDER — GABAPENTIN 300 MG PO CAPS: 300.0000 mg | ORAL_CAPSULE | Freq: Every day | ORAL | 3 refills | Status: DC

## 2022-07-05 NOTE — Telephone Encounter (Signed)
Patient states he needs a refill for his gabapentin (NEURONTIN) 300 MG capsule.  Patient states he has enough left for a couple of days.  *Patient states his preferred pharmacy is [facility] on the corner of [address] and [address].

## 2022-07-07 ENCOUNTER — Encounter: Payer: PPO | Admitting: Family Medicine

## 2022-07-07 ENCOUNTER — Other Ambulatory Visit: Payer: Self-pay

## 2022-07-12 NOTE — Progress Notes (Signed)
Subjective:  Patient ID: Paul Cook, male    DOB: Sep 10, 1951,  MRN: [id number]  Chief Complaint  Patient presents with   Foot Pain    71 y.o. male presents with the above complaint.  Patient presents for follow-up of right posterior tibial tendinitis pain again.  He states he is doing better and the injection definitely helped he still having some discomfort.  He would like to know if he needed another injection.   Review of Systems: Negative except as noted in the HPI. Denies N/V/F/Ch.  Past Medical History:  Diagnosis Date   Adenomatous colon polyp    tubular   Adenomatous rectal polyp    tubular   Asthma    Depression    Diverticulosis    GERD (gastroesophageal reflux disease)    History of chicken pox    Hyperlipidemia    Hypertension    Lung nodule    CT 10/2014   OSA (obstructive sleep apnea)    Sleep apnea    wears cpap   Vitamin D deficiency     Current Outpatient Medications:    amitriptyline (ELAVIL) 10 MG tablet, TAKE 2 TABLETS BY MOUTH AT BEDTIME, Disp: 60 tablet, Rfl: 0   amLODipine (NORVASC) 5 MG tablet, TAKE 1 TABLET(5 MG) BY MOUTH DAILY, Disp: 90 tablet, Rfl: 0   ASPIRIN LOW DOSE 81 MG EC tablet, TAKE 1 TABLET BY MOUTH ONCE DAILY, Disp: 90 tablet, Rfl: 1   ezetimibe (ZETIA) 10 MG tablet, Take 1 tablet (10 mg total) by mouth daily., Disp: 90 tablet, Rfl: 3   gabapentin (NEURONTIN) 300 MG capsule, Take 1 capsule (300 mg total) by mouth daily., Disp: 90 capsule, Rfl: 3   lisinopril-hydrochlorothiazide (ZESTORETIC) 20-12.5 MG tablet, Take 2 tablets by mouth daily., Disp: 180 tablet, Rfl: 3   metoprolol tartrate (LOPRESSOR) 25 MG tablet, Take 1 tablet (25 mg total) by mouth 2 (two) times daily. D/c xl due to cost, Disp: 180 tablet, Rfl: 3   rOPINIRole (REQUIP) 2 MG tablet, TAKE 3 TABLETS(6 MG) BY MOUTH AT BEDTIME, Disp: 270 tablet, Rfl: 1   solifenacin (VESICARE) 5 MG tablet, Take 1 tablet (5 mg total) by mouth daily., Disp: 90 tablet, Rfl: 3  Social History    Tobacco Use  Smoking Status Never   Passive exposure: Never  Smokeless Tobacco Never    Allergies  Allergen Reactions   Nsaids Other (See Comments)    GI Issues   Objective:  There were no vitals filed for this visit. There is no height or weight on file to calculate BMI. Constitutional Well developed. Well nourished.  Vascular Dorsalis pedis pulses palpable bilaterally. Posterior tibial pulses palpable bilaterally. Capillary refill normal to all digits.  No cyanosis or clubbing noted. Pedal hair growth normal.  Neurologic Normal speech. Oriented to person, place, and time. Epicritic sensation to light touch grossly present bilaterally.  Dermatologic Pain along the course of the posterior tibial tendon.  Pain with resisted plantarflexion inversion of the foot.  No pain with dorsiflexion eversion of the foot.  No pain at the Achilles tendon ATFL or peroneal tendon.  Gait examination shows pes planovalgus foot structure with calcaneovalgus to many toe signs partially but recruit the arch with dorsiflexion of the hallux.  Unable to perform single and double heel raise.  Orthopedic: Normal joint ROM without pain or crepitus bilaterally. No visible deformities. No bony tenderness.   Radiographs: None Assessment:   1. Posterior tibial tendinitis, right   2. Pes planovalgus  Plan:  Patient was evaluated and treated and all questions answered.  Right posterior tibial tendinitis -External patient the etiology of tendinitis and various treatment options were discussed.  Given the amount of pain that is having I believe patient will benefit from steroid injection of decrease acute inflammatory component associated pain.  I discussed with him that since this is nearing tendon there is a risk of rupture associated with it.  Patient agrees with plan will elect to proceed with injection. -Another steroid injection was performed at right medial foot at point of maximal tenderness  using 1% plain Lidocaine and 10 mg of Kenalog. This was well tolerated.  Pes planovalgus -I explained to the patient the etiology of pes planovalgus and various treatment options were extensively discussed.  Given the amount of pain that he is having I believe benefit from custom-made orthotics to control after motion support the arch of the foot take the stress away from the posterior tibial tendon.  Patient agrees with the plan. -He was casted for orthotics   No follow-ups on file.

## 2022-07-13 ENCOUNTER — Other Ambulatory Visit: Payer: Self-pay | Admitting: Gastroenterology

## 2022-07-16 DIAGNOSIS — F32A Depression, unspecified: Secondary | ICD-10-CM | POA: Diagnosis not present

## 2022-07-16 DIAGNOSIS — G4700 Insomnia, unspecified: Secondary | ICD-10-CM | POA: Diagnosis not present

## 2022-07-16 DIAGNOSIS — G4733 Obstructive sleep apnea (adult) (pediatric): Secondary | ICD-10-CM | POA: Diagnosis not present

## 2022-07-16 DIAGNOSIS — I10 Essential (primary) hypertension: Secondary | ICD-10-CM | POA: Diagnosis not present

## 2022-07-16 DIAGNOSIS — R0681 Apnea, not elsewhere classified: Secondary | ICD-10-CM | POA: Diagnosis not present

## 2022-07-27 DIAGNOSIS — M9903 Segmental and somatic dysfunction of lumbar region: Secondary | ICD-10-CM | POA: Diagnosis not present

## 2022-07-27 DIAGNOSIS — M5459 Other low back pain: Secondary | ICD-10-CM | POA: Diagnosis not present

## 2022-07-27 DIAGNOSIS — M9901 Segmental and somatic dysfunction of cervical region: Secondary | ICD-10-CM | POA: Diagnosis not present

## 2022-07-27 DIAGNOSIS — M9902 Segmental and somatic dysfunction of thoracic region: Secondary | ICD-10-CM | POA: Diagnosis not present

## 2022-07-29 DIAGNOSIS — M9902 Segmental and somatic dysfunction of thoracic region: Secondary | ICD-10-CM | POA: Diagnosis not present

## 2022-07-29 DIAGNOSIS — M9901 Segmental and somatic dysfunction of cervical region: Secondary | ICD-10-CM | POA: Diagnosis not present

## 2022-07-29 DIAGNOSIS — M9903 Segmental and somatic dysfunction of lumbar region: Secondary | ICD-10-CM | POA: Diagnosis not present

## 2022-07-29 DIAGNOSIS — M5459 Other low back pain: Secondary | ICD-10-CM | POA: Diagnosis not present

## 2022-08-01 DIAGNOSIS — M5459 Other low back pain: Secondary | ICD-10-CM | POA: Diagnosis not present

## 2022-08-01 DIAGNOSIS — M9901 Segmental and somatic dysfunction of cervical region: Secondary | ICD-10-CM | POA: Diagnosis not present

## 2022-08-01 DIAGNOSIS — M9902 Segmental and somatic dysfunction of thoracic region: Secondary | ICD-10-CM | POA: Diagnosis not present

## 2022-08-01 DIAGNOSIS — M9903 Segmental and somatic dysfunction of lumbar region: Secondary | ICD-10-CM | POA: Diagnosis not present

## 2022-08-03 DIAGNOSIS — M9901 Segmental and somatic dysfunction of cervical region: Secondary | ICD-10-CM | POA: Diagnosis not present

## 2022-08-03 DIAGNOSIS — M9902 Segmental and somatic dysfunction of thoracic region: Secondary | ICD-10-CM | POA: Diagnosis not present

## 2022-08-03 DIAGNOSIS — M9903 Segmental and somatic dysfunction of lumbar region: Secondary | ICD-10-CM | POA: Diagnosis not present

## 2022-08-03 DIAGNOSIS — M5459 Other low back pain: Secondary | ICD-10-CM | POA: Diagnosis not present

## 2022-08-08 DIAGNOSIS — M9903 Segmental and somatic dysfunction of lumbar region: Secondary | ICD-10-CM | POA: Diagnosis not present

## 2022-08-08 DIAGNOSIS — M5459 Other low back pain: Secondary | ICD-10-CM | POA: Diagnosis not present

## 2022-08-08 DIAGNOSIS — M9901 Segmental and somatic dysfunction of cervical region: Secondary | ICD-10-CM | POA: Diagnosis not present

## 2022-08-08 DIAGNOSIS — M9902 Segmental and somatic dysfunction of thoracic region: Secondary | ICD-10-CM | POA: Diagnosis not present

## 2022-08-10 DIAGNOSIS — M9903 Segmental and somatic dysfunction of lumbar region: Secondary | ICD-10-CM | POA: Diagnosis not present

## 2022-08-10 DIAGNOSIS — M9902 Segmental and somatic dysfunction of thoracic region: Secondary | ICD-10-CM | POA: Diagnosis not present

## 2022-08-10 DIAGNOSIS — M9901 Segmental and somatic dysfunction of cervical region: Secondary | ICD-10-CM | POA: Diagnosis not present

## 2022-08-10 DIAGNOSIS — M5459 Other low back pain: Secondary | ICD-10-CM | POA: Diagnosis not present

## 2022-08-15 DIAGNOSIS — M9902 Segmental and somatic dysfunction of thoracic region: Secondary | ICD-10-CM | POA: Diagnosis not present

## 2022-08-15 DIAGNOSIS — M9903 Segmental and somatic dysfunction of lumbar region: Secondary | ICD-10-CM | POA: Diagnosis not present

## 2022-08-15 DIAGNOSIS — M5459 Other low back pain: Secondary | ICD-10-CM | POA: Diagnosis not present

## 2022-08-15 DIAGNOSIS — M9901 Segmental and somatic dysfunction of cervical region: Secondary | ICD-10-CM | POA: Diagnosis not present

## 2022-08-16 DIAGNOSIS — M9901 Segmental and somatic dysfunction of cervical region: Secondary | ICD-10-CM | POA: Diagnosis not present

## 2022-08-16 DIAGNOSIS — R0681 Apnea, not elsewhere classified: Secondary | ICD-10-CM | POA: Diagnosis not present

## 2022-08-16 DIAGNOSIS — I10 Essential (primary) hypertension: Secondary | ICD-10-CM | POA: Diagnosis not present

## 2022-08-16 DIAGNOSIS — M9903 Segmental and somatic dysfunction of lumbar region: Secondary | ICD-10-CM | POA: Diagnosis not present

## 2022-08-16 DIAGNOSIS — M9902 Segmental and somatic dysfunction of thoracic region: Secondary | ICD-10-CM | POA: Diagnosis not present

## 2022-08-16 DIAGNOSIS — M5459 Other low back pain: Secondary | ICD-10-CM | POA: Diagnosis not present

## 2022-08-16 DIAGNOSIS — G4700 Insomnia, unspecified: Secondary | ICD-10-CM | POA: Diagnosis not present

## 2022-08-16 DIAGNOSIS — G4733 Obstructive sleep apnea (adult) (pediatric): Secondary | ICD-10-CM | POA: Diagnosis not present

## 2022-08-16 DIAGNOSIS — F32A Depression, unspecified: Secondary | ICD-10-CM | POA: Diagnosis not present

## 2022-08-18 ENCOUNTER — Ambulatory Visit (INDEPENDENT_AMBULATORY_CARE_PROVIDER_SITE_OTHER): Payer: PPO

## 2022-08-18 VITALS — Ht 66.0 in | Wt 306.0 lb

## 2022-08-18 DIAGNOSIS — Z0000 Encounter for general adult medical examination without abnormal findings: Secondary | ICD-10-CM | POA: Diagnosis not present

## 2022-08-18 NOTE — Progress Notes (Signed)
Subjective:   Paul Cook is a 71 y.o. male who presents for Medicare Annual/Subsequent preventive examination.  Review of Systems    No ROS.  Medicare Wellness Virtual Visit.  Visual/audio telehealth visit, UTA vital signs.   See social history for additional risk factors.   Cardiac Risk Factors include: advanced age (>17mn, >>31women);male gender;hypertension     Objective:    Today's Vitals   08/18/22 1250  Weight: (!) 306 lb (138.8 kg)  Height: '5\' 6"'$  (1.676 m)   Body mass index is 49.39 kg/m.     08/18/2022   12:40 PM 08/17/2021    1:29 PM 07/09/2021    4:03 PM 07/08/2021   10:43 AM 07/03/2020    9:44 AM 04/23/2020   11:27 AM 04/23/2019    3:01 PM  Advanced Directives  Does Patient Have a Medical Advance Directive? Yes Yes Yes Yes No Yes Yes  Type of [place]amed[place]of [place] will Living will Living will [place]  [place] Does patient want to make changes to medical advance directive? No - Patient declined  No - Guardian declined No - Guardian declined  No - Patient declined No - Patient declined  Copy of [place] Chart? No - copy requested No - copy requested  No - copy requested  No - copy requested No - copy requested  Would patient like information on creating a medical advance directive?     Yes (MAU/Ambulatory/Procedural Areas - Information given)      Current Medications (verified) Outpatient Encounter Medications as of 08/18/2022  Medication Sig   amitriptyline (ELAVIL) 10 MG tablet TAKE 2 TABLETS BY MOUTH AT BEDTIME   amLODipine (NORVASC) 5 MG tablet TAKE 1 TABLET(5 MG) BY MOUTH DAILY   ASPIRIN LOW DOSE 81 MG EC tablet TAKE 1 TABLET BY MOUTH ONCE DAILY   ezetimibe (ZETIA) 10 MG tablet Take 1 tablet (10 mg total) by mouth daily.   gabapentin (NEURONTIN) 300 MG capsule Take 1 capsule (300 mg total) by mouth daily.    lisinopril-hydrochlorothiazide (ZESTORETIC) 20-12.5 MG tablet Take 2 tablets by mouth daily.   metoprolol tartrate (LOPRESSOR) 25 MG tablet Take 1 tablet (25 mg total) by mouth 2 (two) times daily. D/c xl due to cost   rOPINIRole (REQUIP) 2 MG tablet TAKE 3 TABLETS(6 MG) BY MOUTH AT BEDTIME   solifenacin (VESICARE) 5 MG tablet Take 1 tablet (5 mg total) by mouth daily.   No facility-administered encounter medications on file as of 08/18/2022.    Allergies (verified) Nsaids   History: Past Medical History:  Diagnosis Date   Adenomatous colon polyp    tubular   Adenomatous rectal polyp    tubular   Asthma    Depression    Diverticulosis    GERD (gastroesophageal reflux disease)    History of chicken pox    Hyperlipidemia    Hypertension    Lung nodule    CT 10/2014   OSA (obstructive sleep apnea)    Sleep apnea    wears cpap   Vitamin D deficiency    Past Surgical History:  Procedure Laterality Date   COLONOSCOPY     COLONOSCOPY W/ POLYPECTOMY  2015   [facility], benign   HERNIA REPAIR     inguinal   POLYPECTOMY     TONSILLECTOMY     age 89   Family History  Problem Relation Age of  Onset   Depression Mother    Hypertension Father    Heart disease Father    Stroke Father    Cancer Maternal Grandmother        ovarian?   Cancer Paternal Grandfather        prostate   Prostate cancer Paternal Grandfather    Colon cancer Neg Hx    Colon polyps Neg Hx    Social History   Socioeconomic History   Marital status: Married    Spouse name: Tavish Gettis   Number of children: 1   Years of education: 16   Highest education level: Bachelor's degree (e.g., BA, AB, BS)  Occupational History   Not on file  Tobacco Use   Smoking status: Never    Passive exposure: Never   Smokeless tobacco: Never  Vaping Use   Vaping Use: Never used  Substance and Sexual Activity   Alcohol use: Yes    Alcohol/week: 4.0 standard drinks of alcohol    Types: 4 Cans of beer per week     Comment: Alcoholic in the past   Drug use: No   Sexual activity: Not Currently  Other Topics Concern   Not on file  Social History Narrative   Lives in [place] with wife, [name] 23 years as of 10/2018. 1 daughter in [place].      Work - Optician, dispensing, now Animal nutritionist      From [place] Strain: [place]  (08/18/2022)   Overall Financial Resource Strain (CARDIA)    Difficulty of Paying Living Expenses: Not hard at all  Food Insecurity: No Food Insecurity (08/18/2022)   Hunger Vital Sign    Worried About Running Out of Food in the Last Year: Never true    Ran Out of Food in the Last Year: Never true  Transportation Needs: No Transportation Needs (08/18/2022)   PRAPARE - Hydrologist (Medical): No    Lack of Transportation (Non-Medical): No  Physical Activity: Inactive (08/18/2022)   Exercise Vital Sign    Days of Exercise per Week: 0 days    Minutes of Exercise per Session: 0 min  Stress: No Stress Concern Present (08/18/2022)   [place]    Feeling of Stress : Not at all  Social Connections: Moderately Isolated (08/18/2022)   Social Connection and Isolation Panel [NHANES]    Frequency of Communication with Friends and Family: Never    Frequency of Social Gatherings with Friends and Family: Never    Attends Religious Services: More than 4 times per year    Active Member of [employer] or Organizations: No    Attends Music therapist: Never    Marital Status: Married    Tobacco Counseling Counseling given: Not Answered   Clinical Intake:  Pre-visit preparation completed: Yes        Diabetes: No  How often do you need to have someone help you when you read instructions, pamphlets, or other written materials from your doctor or pharmacy?: 1 - Never    Interpreter Needed?: No      Activities of  Daily Living    08/18/2022   12:42 PM  In your present state of health, do you have any difficulty performing the following activities:  Hearing? 0  Vision? 0  Difficulty concentrating or making decisions? 0  Walking or climbing stairs? 0  Dressing or bathing? 0  Doing errands, shopping? 0  Preparing Food and eating ? N  Using the Toilet? N  In the past six months, have you accidently leaked urine? N  Do you have problems with loss of bowel control? N  Managing your Medications? N  Managing your Finances? N  Housekeeping or managing your Housekeeping? N    Patient Care Team: Carollee Leitz, MD as PCP - General (Family Medicine) End, Harrell Gave, MD as PCP - Cardiology (Cardiology)  Indicate any recent Medical Services you may have received from other than [facility] providers in the past year (date may be approximate).     Assessment:   This is a routine wellness examination for Paul Cook.  I connected with  Paul Cook on 08/18/22 by a audio enabled telemedicine application and verified that I am speaking with the correct person using two identifiers.  Patient Location: Home  Provider Location: Office/Clinic  I discussed the limitations of evaluation and management by telemedicine. The patient expressed understanding and agreed to proceed.   Hearing/Vision screen Hearing Screening - Comments:: Patient is able to hear conversational tones without difficulty.  No issues reported.   Vision Screening - Comments:: Followed by [facility] Wears corrective lenses They have seen their ophthalmologist in the last 12 months.    Dietary issues and exercise activities discussed: Current Exercise Habits: The patient does not participate in regular exercise at present Regular diet   Goals Addressed               This Visit's Progress     Patient Stated     I would like to lose weight (pt-stated)        Healthy portion controlled diet Stay active Stay hydrated        Depression Screen    08/18/2022   12:44 PM 05/11/2022    2:42 PM 02/09/2022   11:43 AM 08/17/2021    1:26 PM 07/09/2021    4:10 PM 07/08/2021   10:36 AM 07/02/2021    9:45 AM  PHQ 2/9 Scores  PHQ - 2 Score 0 0 0 0  0 0  Exception Documentation     Other- indicate reason in comment box Other- indicate reason in comment box   Not completed     did not speak with patient Verified by Wife - Dapne Henrene Pastor     Fall Risk    08/18/2022   12:40 PM 05/11/2022    2:42 PM 08/17/2021    1:30 PM 07/09/2021    4:09 PM 07/08/2021   10:40 AM  [place] in the past year? 0 1 0 0 0  Number falls in past yr: 0 1  0 0  Injury with Fall? 0 0  0 0  Risk for fall due to : No Fall Risks History of fall(s) Mental status change Impaired vision;Mental status change Other (Comment);Impaired balance/gait;Impaired mobility;Mental status change  Risk for fall due to: Comment     Non-Ambulatory  Follow up Follow up appointment Falls evaluation completed Falls evaluation completed Falls prevention discussed;Education provided;Falls evaluation completed Falls evaluation completed;Education provided;Falls prevention discussed    FALL RISK PREVENTION PERTAINING TO THE HOME: Home free of loose throw rugs in walkways, pet beds, electrical cords, etc? Yes Adequate lighting in your home to reduce risk of falls? Yes   ASSISTIVE DEVICES UTILIZED TO PREVENT FALLS: Life alert? No  Use of a cane, walker or w/c? Yes   TIMED UP AND GO: Was the  test performed? No .   Cognitive Function:    04/04/2018   11:08 AM  MMSE - Mini Mental State Exam  Orientation to time 5  Orientation to Place 5  Registration 3  Attention/ Calculation 5  Recall 3  Language- name 2 objects 2  Language- repeat 1  Language- follow 3 step command 3  Language- read & follow direction 1  Write a sentence 1  Copy design 1  Total score 30        08/18/2022   12:58 PM 08/17/2021    1:32 PM 04/23/2020   11:20 AM 04/23/2019    3:06 PM  6CIT  Screen  What Year? 0 points 0 points 0 points 0 points  What month? 0 points 0 points 0 points 0 points  What time? 0 points 0 points  0 points  Count back from 20 0 points 0 points 0 points 0 points  Months in reverse 0 points 0 points 0 points 0 points  Repeat phrase 0 points 0 points 0 points 0 points  Total Score 0 points 0 points  0 points    Immunizations Immunization History  Administered Date(s) Administered   Fluad Quad(high Dose 65+) 07/22/2019, 07/01/2020, 07/02/2021   Influenza Whole 06/19/2013   Influenza, High Dose Seasonal PF 06/28/2018, 07/02/2021   Influenza,inj,Quad PF,6+ Mos 07/15/2014, 07/18/2016   Influenza-Unspecified 06/18/2015   PFIZER(Purple Top)SARS-COV-2 Vaccination 12/03/2019, 12/24/2019   Pneumococcal Conjugate PCV 7 03/30/2017   Pneumococcal Conjugate-13 03/30/2017   Pneumococcal Polysaccharide-23 04/04/2018   Tdap 04/17/2014   Zoster, Live 12/18/2013   Flu Vaccine status: Due, Education has been provided regarding the importance of this vaccine. Advised may receive this vaccine at local pharmacy or Health Dept. Aware to provide a copy of the vaccination record if obtained from local pharmacy or Health Dept. Verbalized acceptance and understanding.  Covid-19 vaccine status: Completed vaccines  Shingrix Completed?: No.    Education has been provided regarding the importance of this vaccine. Patient has been advised to call insurance company to determine out of pocket expense if they have not yet received this vaccine. Advised may also receive vaccine at local pharmacy or Health Dept. Verbalized acceptance and understanding.  Screening Tests Health Maintenance  Topic Date Due   Fecal DNA (Cologuard)  Never done   COVID-19 Vaccine (3 - Pfizer series) 10/17/2022 (Originally 02/18/2020)   Zoster Vaccines- Shingrix (1 of 2) 11/18/2022 (Originally 09/05/2001)   INFLUENZA VACCINE  01/15/2023 (Originally 05/17/2022)   Medicare Annual Wellness (AWV)  08/19/2023    TETANUS/TDAP  04/17/2024   Pneumonia Vaccine 97+ Years old  Completed   HPV VACCINES  Aged Out   COLONOSCOPY (Pts 45-43yr Insurance coverage will need to be confirmed)  Discontinued   Health Maintenance Health Maintenance Due  Topic Date Due   Fecal DNA (Cologuard)  Never done   Cologuard- repeating the process. Will update upon completion.   Lung Cancer Screening: (Low Dose CT Chest recommended if Age 71-80years, 30 pack-year currently smoking OR have quit w/in 15years.) does not qualify.   Vision Screening: Recommended annual ophthalmology exams for early detection of glaucoma and other disorders of the eye.  Dental Screening: Recommended annual dental exams for proper oral hygiene.  Community Resource Referral / Chronic Care Management: CRR required this visit?  No   CCM required this visit?  No      Plan:     I have personally reviewed and noted the following in the patient's chart:   Medical and  social history Use of alcohol, tobacco or illicit drugs  Current medications and supplements including opioid prescriptions. Patient is not currently taking opioid prescriptions. Functional ability and status Nutritional status Physical activity Advanced directives List of other physicians Hospitalizations, surgeries, and ER visits in previous 12 months Vitals Screenings to include cognitive, depression, and falls Referrals and appointments  In addition, I have reviewed and discussed with patient certain preventive protocols, quality metrics, and best practice recommendations. A written personalized care plan for preventive services as well as general preventive health recommendations were provided to patient.     Leta Jungling, LPN   23/01/1442

## 2022-08-18 NOTE — Patient Instructions (Addendum)
[name] , Thank you for taking time to come for your Medicare Wellness Visit. I appreciate your ongoing commitment to your health goals. Please review the following plan we discussed and let me know if I can assist you in the future.   These are the goals we discussed:  Goals       Patient Stated     Blood Pressure < 140/90 (pt-stated)      Buy a new blood pressure machine with reading at 130/80      I would like to lose weight (pt-stated)      Healthy portion controlled diet Stay active Stay hydrated        This is a list of the screening recommended for you and due dates:  Health Maintenance  Topic Date Due   Cologuard (Stool DNA test)  Never done   COVID-19 Vaccine (3 - Pfizer series) 10/17/2022*   Zoster (Shingles) Vaccine (1 of 2) 11/18/2022*   Flu Shot  01/15/2023*   Medicare Annual Wellness Visit  08/19/2023   Tetanus Vaccine  04/17/2024   Pneumonia Vaccine  Completed   HPV Vaccine  Aged Out   Colon Cancer Screening  Discontinued  *Topic was postponed. The date shown is not the original due date.    Advanced directives: End of life planning; Advance aging; Advanced directives discussed.  Copy of current HCPOA/Living Will requested.    Conditions/risks identified: none new  Next appointment: Follow up in one year for your annual wellness visit.   Preventive Care 63 Years and Older, Male  Preventive care refers to lifestyle choices and visits with your health care provider that can promote health and wellness. What does preventive care include? A yearly physical exam. This is also called an annual well check. Dental exams once or twice a year. Routine eye exams. Ask your health care provider how often you should have your eyes checked. Personal lifestyle choices, including: Daily care of your teeth and gums. Regular physical activity. Eating a healthy diet. Avoiding tobacco and drug use. Limiting alcohol use. Practicing safe sex. Taking low doses of  aspirin every day. Taking vitamin and mineral supplements as recommended by your health care provider. What happens during an annual well check? The services and screenings done by your health care provider during your annual well check will depend on your age, overall health, lifestyle risk factors, and family history of disease. Counseling  Your health care provider may ask you questions about your: Alcohol use. Tobacco use. Drug use. Emotional well-being. Home and relationship well-being. Sexual activity. Eating habits. History of falls. Memory and ability to understand (cognition). Work and work Statistician. Screening  You may have the following tests or measurements: Height, weight, and BMI. Blood pressure. Lipid and cholesterol levels. These may be checked every 5 years, or more frequently if you are over 72 years old. Skin check. Lung cancer screening. You may have this screening every year starting at age 66 if you have a 30-pack-year history of smoking and currently smoke or have quit within the past 15 years. Fecal occult blood test (FOBT) of the stool. You may have this test every year starting at age 65. Flexible sigmoidoscopy or colonoscopy. You may have a sigmoidoscopy every 5 years or a colonoscopy every 10 years starting at age 62. Prostate cancer screening. Recommendations will vary depending on your family history and other risks. Hepatitis C blood test. Hepatitis B blood test. Sexually transmitted disease (STD) testing. Diabetes screening. This is done by  checking your blood sugar (glucose) after you have not eaten for a while (fasting). You may have this done every 1-3 years. Abdominal aortic aneurysm (AAA) screening. You may need this if you are a current or former smoker. Osteoporosis. You may be screened starting at age 52 if you are at high risk. Talk with your health care provider about your test results, treatment options, and if necessary, the need for more  tests. Vaccines  Your health care provider may recommend certain vaccines, such as: Influenza vaccine. This is recommended every year. Tetanus, diphtheria, and acellular pertussis (Tdap, Td) vaccine. You may need a Td booster every 10 years. Zoster vaccine. You may need this after age 66. Pneumococcal 13-valent conjugate (PCV13) vaccine. One dose is recommended after age 61. Pneumococcal polysaccharide (PPSV23) vaccine. One dose is recommended after age 4. Talk to your health care provider about which screenings and vaccines you need and how often you need them. This information is not intended to replace advice given to you by your health care provider. Make sure you discuss any questions you have with your health care provider. Document Released: 10/30/2015 Document Revised: 06/22/2016 Document Reviewed: 08/04/2015 Elsevier Interactive Patient Education  2017 [place] Prevention in the Home Falls can cause injuries. They can happen to people of all ages. There are many things you can do to make your home safe and to help prevent falls. What can I do on the outside of my home? Regularly fix the edges of walkways and driveways and fix any cracks. Remove anything that might make you trip as you walk through a door, such as a raised step or threshold. Trim any bushes or trees on the path to your home. Use bright outdoor lighting. Clear any walking paths of anything that might make someone trip, such as rocks or tools. Regularly check to see if handrails are loose or broken. Make sure that both sides of any steps have handrails. Any raised decks and porches should have guardrails on the edges. Have any leaves, snow, or ice cleared regularly. Use sand or salt on walking paths during winter. Clean up any spills in your garage right away. This includes oil or grease spills. What can I do in the bathroom? Use night lights. Install grab bars by the toilet and in the tub and shower.  Do not use towel bars as grab bars. Use non-skid mats or decals in the tub or shower. If you need to sit down in the shower, use a plastic, non-slip stool. Keep the floor dry. Clean up any water that spills on the floor as soon as it happens. Remove soap buildup in the tub or shower regularly. Attach bath mats securely with double-sided non-slip rug tape. Do not have throw rugs and other things on the floor that can make you trip. What can I do in the bedroom? Use night lights. Make sure that you have a light by your bed that is easy to reach. Do not use any sheets or blankets that are too big for your bed. They should not hang down onto the floor. Have a firm chair that has side arms. You can use this for support while you get dressed. Do not have throw rugs and other things on the floor that can make you trip. What can I do in the kitchen? Clean up any spills right away. Avoid walking on wet floors. Keep items that you use a lot in easy-to-reach places. If you need to reach  something above you, use a strong step stool that has a grab bar. Keep electrical cords out of the way. Do not use floor polish or wax that makes floors slippery. If you must use wax, use non-skid floor wax. Do not have throw rugs and other things on the floor that can make you trip. What can I do with my stairs? Do not leave any items on the stairs. Make sure that there are handrails on both sides of the stairs and use them. Fix handrails that are broken or loose. Make sure that handrails are as long as the stairways. Check any carpeting to make sure that it is firmly attached to the stairs. Fix any carpet that is loose or worn. Avoid having throw rugs at the top or bottom of the stairs. If you do have throw rugs, attach them to the floor with carpet tape. Make sure that you have a light switch at the top of the stairs and the bottom of the stairs. If you do not have them, ask someone to add them for you. What else  can I do to help prevent falls? Wear shoes that: Do not have high heels. Have rubber bottoms. Are comfortable and fit you well. Are closed at the toe. Do not wear sandals. If you use a stepladder: Make sure that it is fully opened. Do not climb a closed stepladder. Make sure that both sides of the stepladder are locked into place. Ask someone to hold it for you, if possible. Clearly mark and make sure that you can see: Any grab bars or handrails. First and last steps. Where the edge of each step is. Use tools that help you move around (mobility aids) if they are needed. These include: Canes. Walkers. Scooters. Crutches. Turn on the lights when you go into a dark area. Replace any light bulbs as soon as they burn out. Set up your furniture so you have a clear path. Avoid moving your furniture around. If any of your floors are uneven, fix them. If there are any pets around you, be aware of where they are. Review your medicines with your doctor. Some medicines can make you feel dizzy. This can increase your chance of falling. Ask your doctor what other things that you can do to help prevent falls. This information is not intended to replace advice given to you by your health care provider. Make sure you discuss any questions you have with your health care provider. Document Released: 07/30/2009 Document Revised: 03/10/2016 Document Reviewed: 11/07/2014 Elsevier Interactive Patient Education  2017 [employer].

## 2022-08-19 ENCOUNTER — Other Ambulatory Visit: Payer: Self-pay | Admitting: Gastroenterology

## 2022-08-22 ENCOUNTER — Ambulatory Visit (INDEPENDENT_AMBULATORY_CARE_PROVIDER_SITE_OTHER): Payer: PPO | Admitting: Family Medicine

## 2022-08-22 ENCOUNTER — Encounter: Payer: Self-pay | Admitting: Family Medicine

## 2022-08-22 VITALS — BP 128/62 | HR 84 | Temp 98.8°F | Ht 66.0 in | Wt 299.2 lb

## 2022-08-22 DIAGNOSIS — F0153 Vascular dementia, unspecified severity, with mood disturbance: Secondary | ICD-10-CM | POA: Diagnosis not present

## 2022-08-22 NOTE — Patient Instructions (Addendum)
It was a pleasure meeting you today. Thank you for allowing me to take part in your health care.  Our goals for today as we discussed include:  For your blood pressure Continue current medication  Continue current Gabapentin 300 mg daily  Stop alcohol intake  Will look into the Amitriptyline 20 mg at night that was prescribed by your Gastroenterologist.   Recommend to follow up with Neurology Dr Manuella Ghazi or [place] at [facility] to discuss memory decline. Please call to schedule an appointment at your earliest convenience.   Please follow-up with PCP in 2 weeks  If you have any questions or concerns, please do not hesitate to call the office at [phone].  I look forward to our next visit and until then take care and stay safe.  Regards,   Carollee Leitz, MD   [facility]

## 2022-08-22 NOTE — Progress Notes (Signed)
    SUBJECTIVE:   CHIEF COMPLAINT / HPI: increased confusion  Patient presents to clinic with wife.  History obtained from wife.  She reports that patients confusion is worsening.  She feels like the Gabapentin may be affecting his condition.  She reports that he continues to use EtOH.  She has found beer cans.  Patient indicates that he has not had EtOH since his last visit to office.  Per patient's wife, if she asks him to get her a glass of water he forgets to do it.  Per patient he gets the glass of water but may be a little later when he remembers.  Wife reports she was not aware of the Amitriptyline that patient was prescribed.  She is very upset that this medication was prescribed.  She is also upset with the Gabapentin.   PERTINENT  PMH / PSH:  Vascular Dementia EtOH use Anxiety/Depression OCD   OBJECTIVE:   BP 128/62 (BP Location: Left Arm, Patient Position: Sitting, Cuff Size: Normal)   Pulse 84   Temp 98.8 F (37.1 C) (Oral)   Ht '5\' 6"'$  (1.676 m)   Wt 299 lb 3.2 oz (135.7 kg)   SpO2 95%   BMI 48.29 kg/m    General: Alert,oriented to person and place no acute distress Cardio: Normal S1 and S2, RRR, no r/m/g Pulm: CTAB, normal work of breathing Extremities: No peripheral edema.  Neuro: CN II: PERRL CN III, IV,VI: EOMI CV V: Normal sensation in V1, V2, V3 CVII: Symmetric smile and brow raise CN VIII: Normal hearing CN IX,X: Symmetric palate raise  CN XI: 5/5 shoulder shrug CN XII: Symmetric tongue protrusion  UE and LE strength 5/5 2+ UE and LE reflexes  Normal sensation in UE and LE bilaterally  No ataxia with finger to nose, normal heel to shin  Negative Rhomberg     ASSESSMENT/PLAN:   Vascular dementia with depressed mood (HCC) Chronic. Stable.  No focal deficits on exam.  Low suspicion for CVA/TIA.  Unclear if taking Elavil at night.  Gabapentin is at low dose.  Has not had follow up with Neurology -Do not think imaging is required at this time. -Will  have patient return in 1 week and to bring in medications at that time to clarify -Discussed possible worsening of dementia -Recommend to follow up with Neurology -Strict return precautions provided   PDMP Reviewed  Carollee Leitz, MD

## 2022-08-30 ENCOUNTER — Telehealth: Payer: Self-pay | Admitting: Family Medicine

## 2022-08-30 ENCOUNTER — Ambulatory Visit (INDEPENDENT_AMBULATORY_CARE_PROVIDER_SITE_OTHER): Payer: PPO | Admitting: Family Medicine

## 2022-08-30 ENCOUNTER — Encounter: Payer: Self-pay | Admitting: Family Medicine

## 2022-08-30 VITALS — BP 124/80 | HR 68 | Temp 97.6°F | Ht 66.0 in | Wt 300.0 lb

## 2022-08-30 DIAGNOSIS — F419 Anxiety disorder, unspecified: Secondary | ICD-10-CM

## 2022-08-30 DIAGNOSIS — F0153 Vascular dementia, unspecified severity, with mood disturbance: Secondary | ICD-10-CM

## 2022-08-30 DIAGNOSIS — F1021 Alcohol dependence, in remission: Secondary | ICD-10-CM | POA: Diagnosis not present

## 2022-08-30 DIAGNOSIS — I10 Essential (primary) hypertension: Secondary | ICD-10-CM

## 2022-08-30 DIAGNOSIS — R748 Abnormal levels of other serum enzymes: Secondary | ICD-10-CM | POA: Diagnosis not present

## 2022-08-30 DIAGNOSIS — F32A Depression, unspecified: Secondary | ICD-10-CM | POA: Diagnosis not present

## 2022-08-30 DIAGNOSIS — F1011 Alcohol abuse, in remission: Secondary | ICD-10-CM | POA: Diagnosis not present

## 2022-08-30 DIAGNOSIS — I700 Atherosclerosis of aorta: Secondary | ICD-10-CM | POA: Diagnosis not present

## 2022-08-30 NOTE — Telephone Encounter (Signed)
Patient was seen in the office for follow up. However, at the visit the patient's wife was a little confrontational with the provider. The patient's wife stated that she was the POA of the patient and she wanted to make a medical decision regarding the patient coming off a medication that was given by another provider. She stated to the provider that she no longer wanted the patient to be seen by this GI provider and that the medication he was prescribed by the GI provider was making his memory loss worse. I spoke with the patient and his wife concerning this matter. She and the patient agreed to allow Dr. Volanda Napoleon to wean the patient off the medication. After the visit the patient's wife and the patient were please with the outcome. Patient will follow up in 2 weeks to make sure his Pill packs are correct for the weaning  process.

## 2022-08-30 NOTE — Progress Notes (Signed)
SUBJECTIVE:   CHIEF COMPLAINT / HPI:  Follow-up medication review  Patient presents to clinic with wife to review medications.   Patient was evaluated by GI on 10/20/2021 for tubular adenoma of the colon.  At that time he was started on amitriptyline 20 mg daily for anxiety.  Presumably states that he has been taking this medication.  His wife states she had been giving him his medications and she was sure this medication was not there 8 months ago.  She reports she has been researching this medication because of his dementia she feels that this is an appropriate medication discussed with patient. Medications reviewed and Elavil was noted in blister pack at night.  Patients wife was extremely upset and demanded that medication be stopped.  She reports that patient is not competent and cannot make decisions. She has taken his car keys away because he got lost for 4 hours once.  She is upset that he cuts his clothes, this has been an ongoing concern as part of his OCD.   Per patient, he does not feel that his confusion is as bad as what his wife makes it appears.  He reports that he is forgetful and it takes him a little longer to get things for her when she requests things, like a glass of water.  He does not think his alcohol intake has increased.  Maybe one or two in the past week.    PERTINENT  PMH / PSH:  Vascular Dementia EtOH HTN   OBJECTIVE:   BP 124/80 (BP Location: Left Arm, Patient Position: Sitting, Cuff Size: Large)   Pulse 68   Temp 97.6 F (36.4 C) (Oral)   Ht '5\' 6"'$  (1.676 m)   Wt 300 lb (136.1 kg)   SpO2 97%   BMI 48.42 kg/m    General: Alert, person, place, time and can recall why he is here today,  no acute distress Cardio: Normal S1 and S2, RRR, no r/m/g Pulm: CTAB, normal work of breathing  ASSESSMENT/PLAN:   Vascular dementia with depressed mood (HCC) Chronic. Stable.  Discussed with patient to lower dose of Amitriptyline.  To take 10 mg at night for 1  week, then 10 mg every other night for 1 week.  Then follow up in 2 weeks with PCP.  Patient was able to recall instructions and verbalize back.  I cannot say that he is incompetent to make decisions per wife.  I do think that at some point he will lack capacity to make decisions and recommend she schedule to see Neurology for evaluation. -Taper Amitriptyline from 20 mg to 10 mg at night x 1 week, then 10 mg every other night -Encouraged to schedule RN appointment and bring in all blister medication packs to help with removal of Amitriptyline -Follow up in 2 weeks  Essential hypertension Elevated today.  Per JNC 8 guidelines goal for age <150/90.  Patient at goal -Discussed diet and increase in activity.  Difficult to control habits per wife -Continue Zestoretic 20-12.5 mg daily -Continue Metoprolol 25 mg BID -Continue Amlodipine 5 mg daily -Will repeat at next visit -Cmet at next visit -Limit salt intake -Avoid EtOH -Follow up in 2 weeks  Elevated liver enzymes Remains elevated but has improved Repeat LFT's at next visit  Avoid EtOH  Anxiety and depression Was prescribed Amitriptyline in 01/23.  Previously taking Trazodone.  Unclear as to why medication was discontinued given history of insomnia, anxiety, depression, vascular dementia. -Will wean Amitriptyline from  20 mg to 10 mg at night x 1 week, then 10 mg every other night.  Follow up to assess for continued weaning -Could consider restarting Trazodone in future -Consider Citalopram in future if worsening symptoms of depression/anxiety  History of alcoholism ([place]) Avoid EtOH use    PDMP reviewed  Carollee Leitz, MD

## 2022-08-30 NOTE — Patient Instructions (Signed)
It was a pleasure meeting you today. Thank you for allowing me to take part in your health care.  Our goals for today as we discussed include:  For your dementia  Follow-up with Dr. Manuella Ghazi neurology  Decrease amitriptyline to 10 mg at night for 1 week Then start amitriptyline 10 mg every other night Please bring in your medications  Please follow-up with pcp 2 weeks  If you have any questions or concerns, please do not hesitate to call the office at [phone].  I look forward to our next visit and until then take care and stay safe.  Regards,   Carollee Leitz, MD   [facility]

## 2022-09-04 ENCOUNTER — Encounter: Payer: Self-pay | Admitting: Family Medicine

## 2022-09-04 DIAGNOSIS — F1021 Alcohol dependence, in remission: Secondary | ICD-10-CM | POA: Insufficient documentation

## 2022-09-04 NOTE — Assessment & Plan Note (Signed)
Remains elevated but has improved Repeat LFT's at next visit  Avoid EtOH

## 2022-09-04 NOTE — Assessment & Plan Note (Signed)
Avoid EtOH use

## 2022-09-04 NOTE — Assessment & Plan Note (Signed)
Was prescribed Amitriptyline in 01/23.  Previously taking Trazodone.  Unclear as to why medication was discontinued given history of insomnia, anxiety, depression, vascular dementia. -Will wean Amitriptyline from 20 mg to 10 mg at night x 1 week, then 10 mg every other night.  Follow up to assess for continued weaning -Could consider restarting Trazodone in future -Consider Citalopram in future if worsening symptoms of depression/anxiety

## 2022-09-04 NOTE — Assessment & Plan Note (Signed)
Chronic. Stable.  No focal deficits on exam.  Low suspicion for CVA/TIA.  Unclear if taking Elavil at night.  Gabapentin is at low dose.  Has not had follow up with Neurology -Do not think imaging is required at this time. -Will have patient return in 1 week and to bring in medications at that time to clarify -Discussed possible worsening of dementia -Recommend to follow up with Neurology -Strict return precautions provided

## 2022-09-04 NOTE — Assessment & Plan Note (Signed)
Chronic. Stable.  Discussed with patient to lower dose of Amitriptyline.  To take 10 mg at night for 1 week, then 10 mg every other night for 1 week.  Then follow up in 2 weeks with PCP.  Patient was able to recall instructions and verbalize back.  I cannot say that he is incompetent to make decisions per wife.  I do think that at some point he will lack capacity to make decisions and recommend she schedule to see Neurology for evaluation. -Taper Amitriptyline from 20 mg to 10 mg at night x 1 week, then 10 mg every other night -Encouraged to schedule RN appointment and bring in all blister medication packs to help with removal of Amitriptyline -Follow up in 2 weeks

## 2022-09-04 NOTE — Assessment & Plan Note (Signed)
Elevated today.  Per JNC 8 guidelines goal for age <150/90.  Patient at goal -Discussed diet and increase in activity.  Difficult to control habits per wife -Continue Zestoretic 20-12.5 mg daily -Continue Metoprolol 25 mg BID -Continue Amlodipine 5 mg daily -Will repeat at next visit -Cmet at next visit -Limit salt intake -Avoid EtOH -Follow up in 2 weeks

## 2022-09-13 ENCOUNTER — Other Ambulatory Visit: Payer: Self-pay | Admitting: *Deleted

## 2022-09-13 ENCOUNTER — Ambulatory Visit (INDEPENDENT_AMBULATORY_CARE_PROVIDER_SITE_OTHER): Payer: PPO

## 2022-09-13 DIAGNOSIS — G2581 Restless legs syndrome: Secondary | ICD-10-CM

## 2022-09-13 MED ORDER — GABAPENTIN 300 MG PO CAPS: 300.0000 mg | ORAL_CAPSULE | Freq: Every day | ORAL | 1 refills | Status: DC

## 2022-09-13 MED ORDER — AMLODIPINE BESYLATE 5 MG PO TABS: ORAL_TABLET | ORAL | 1 refills | Status: DC

## 2022-09-13 NOTE — Progress Notes (Addendum)
Pt presented to have medication removed from his blister packs, unfortunately this is a pharmacists job to do. Pt was identified through two identifiers. A copy of his medications were created he was advised to go to [facility] pharmacy to get them to re package the medication. Pts wife stated he is Gabapentin 300 mg at night and that is not on his list. She states she needs Dr. Volanda Napoleon to D/c amitriptyline and add the gabapentin 300 mg copy of the medications list has been left in provider folder for review.    Pt wife stated she was removing the amitriptyline herself and has no problem continuing to do so. Pt needs Rx for the gabepentin 300 mg at bedtime sent in enough for the remainder of the month and to be included in the next pill pack.

## 2022-09-13 NOTE — Addendum Note (Signed)
Addended by: Gracy Racer on: 09/13/2022 11:50 AM   Modules accepted: Level of Service

## 2022-09-13 NOTE — Telephone Encounter (Signed)
Thank you :)

## 2022-09-13 NOTE — Telephone Encounter (Signed)
I have contacted [facility] & discontinued the Amitriptyline. I also fixed the refills for Gabapentine & Amlodipine & sent to correct pharmacy.  [facility] will fill the Gabapentin now & add to pill pack the next time.

## 2022-09-15 DIAGNOSIS — I10 Essential (primary) hypertension: Secondary | ICD-10-CM | POA: Diagnosis not present

## 2022-09-15 DIAGNOSIS — G4700 Insomnia, unspecified: Secondary | ICD-10-CM | POA: Diagnosis not present

## 2022-09-15 DIAGNOSIS — F32A Depression, unspecified: Secondary | ICD-10-CM | POA: Diagnosis not present

## 2022-09-15 DIAGNOSIS — R0681 Apnea, not elsewhere classified: Secondary | ICD-10-CM | POA: Diagnosis not present

## 2022-09-15 DIAGNOSIS — G4733 Obstructive sleep apnea (adult) (pediatric): Secondary | ICD-10-CM | POA: Diagnosis not present

## 2022-09-20 DIAGNOSIS — G4700 Insomnia, unspecified: Secondary | ICD-10-CM | POA: Diagnosis not present

## 2022-09-20 DIAGNOSIS — I10 Essential (primary) hypertension: Secondary | ICD-10-CM | POA: Diagnosis not present

## 2022-09-20 DIAGNOSIS — G4733 Obstructive sleep apnea (adult) (pediatric): Secondary | ICD-10-CM | POA: Diagnosis not present

## 2022-10-03 ENCOUNTER — Other Ambulatory Visit: Payer: Self-pay

## 2022-10-03 ENCOUNTER — Telehealth: Payer: Self-pay | Admitting: Family Medicine

## 2022-10-03 DIAGNOSIS — G2581 Restless legs syndrome: Secondary | ICD-10-CM

## 2022-10-03 MED ORDER — ROPINIROLE HCL 2 MG PO TABS: ORAL_TABLET | ORAL | 1 refills | Status: DC

## 2022-10-03 NOTE — Telephone Encounter (Signed)
Prescription sent in  

## 2022-10-03 NOTE — Telephone Encounter (Signed)
Patient completley out of medication . Needing refill called to the pharmacy today. Per the patient's wife he has never ran out of medication and it the providers fault. Refill on rOPINIRole (REQUIP) 2 MG tablet

## 2022-10-05 NOTE — Telephone Encounter (Signed)
Returned Patient's wife call and she states that [facility] is not Public relations account executive and I looked in the chart and let her know that the only Pharmacy in his chart is [facility] in [place]. Then she states that Dr. Volanda Napoleon weaned the Patient off of Elavil but the Pharmacy states it is still on his medication list but I do not see Elavil on the list and I read the medication list to her. Patient's wife states [facility] says the problem is the providers office not them.

## 2022-10-05 NOTE — Telephone Encounter (Signed)
Pt wife would like to be called in regards to pt medication

## 2022-10-16 DIAGNOSIS — G4700 Insomnia, unspecified: Secondary | ICD-10-CM | POA: Diagnosis not present

## 2022-10-16 DIAGNOSIS — I10 Essential (primary) hypertension: Secondary | ICD-10-CM | POA: Diagnosis not present

## 2022-10-16 DIAGNOSIS — F32A Depression, unspecified: Secondary | ICD-10-CM | POA: Diagnosis not present

## 2022-10-16 DIAGNOSIS — R0681 Apnea, not elsewhere classified: Secondary | ICD-10-CM | POA: Diagnosis not present

## 2022-10-16 DIAGNOSIS — G4733 Obstructive sleep apnea (adult) (pediatric): Secondary | ICD-10-CM | POA: Diagnosis not present

## 2022-11-16 DIAGNOSIS — I10 Essential (primary) hypertension: Secondary | ICD-10-CM | POA: Diagnosis not present

## 2022-11-16 DIAGNOSIS — F32A Depression, unspecified: Secondary | ICD-10-CM | POA: Diagnosis not present

## 2022-11-16 DIAGNOSIS — R0681 Apnea, not elsewhere classified: Secondary | ICD-10-CM | POA: Diagnosis not present

## 2022-11-16 DIAGNOSIS — G4700 Insomnia, unspecified: Secondary | ICD-10-CM | POA: Diagnosis not present

## 2022-11-16 DIAGNOSIS — G4733 Obstructive sleep apnea (adult) (pediatric): Secondary | ICD-10-CM | POA: Diagnosis not present

## 2022-11-29 DIAGNOSIS — M25471 Effusion, right ankle: Secondary | ICD-10-CM | POA: Diagnosis not present

## 2022-11-29 DIAGNOSIS — E782 Mixed hyperlipidemia: Secondary | ICD-10-CM | POA: Diagnosis not present

## 2022-11-29 DIAGNOSIS — F0153 Vascular dementia, unspecified severity, with mood disturbance: Secondary | ICD-10-CM | POA: Diagnosis not present

## 2022-11-29 DIAGNOSIS — F1011 Alcohol abuse, in remission: Secondary | ICD-10-CM | POA: Diagnosis not present

## 2022-11-29 DIAGNOSIS — C187 Malignant neoplasm of sigmoid colon: Secondary | ICD-10-CM | POA: Diagnosis not present

## 2022-11-29 DIAGNOSIS — G2581 Restless legs syndrome: Secondary | ICD-10-CM | POA: Diagnosis not present

## 2022-11-29 DIAGNOSIS — I10 Essential (primary) hypertension: Secondary | ICD-10-CM | POA: Diagnosis not present

## 2022-11-29 DIAGNOSIS — M25571 Pain in right ankle and joints of right foot: Secondary | ICD-10-CM | POA: Diagnosis not present

## 2022-11-29 DIAGNOSIS — M79671 Pain in right foot: Secondary | ICD-10-CM | POA: Diagnosis not present

## 2022-11-29 DIAGNOSIS — E559 Vitamin D deficiency, unspecified: Secondary | ICD-10-CM | POA: Diagnosis not present

## 2022-11-29 DIAGNOSIS — R7309 Other abnormal glucose: Secondary | ICD-10-CM | POA: Diagnosis not present

## 2022-11-29 DIAGNOSIS — Z0000 Encounter for general adult medical examination without abnormal findings: Secondary | ICD-10-CM | POA: Diagnosis not present

## 2022-11-29 DIAGNOSIS — F1021 Alcohol dependence, in remission: Secondary | ICD-10-CM | POA: Diagnosis not present

## 2022-11-29 DIAGNOSIS — K219 Gastro-esophageal reflux disease without esophagitis: Secondary | ICD-10-CM | POA: Diagnosis not present

## 2022-11-29 DIAGNOSIS — I700 Atherosclerosis of aorta: Secondary | ICD-10-CM | POA: Diagnosis not present

## 2022-12-01 DIAGNOSIS — F32A Depression, unspecified: Secondary | ICD-10-CM | POA: Diagnosis not present

## 2022-12-01 DIAGNOSIS — F419 Anxiety disorder, unspecified: Secondary | ICD-10-CM | POA: Diagnosis not present

## 2022-12-01 DIAGNOSIS — M25571 Pain in right ankle and joints of right foot: Secondary | ICD-10-CM | POA: Diagnosis not present

## 2022-12-01 DIAGNOSIS — E559 Vitamin D deficiency, unspecified: Secondary | ICD-10-CM | POA: Diagnosis not present

## 2022-12-01 DIAGNOSIS — M79671 Pain in right foot: Secondary | ICD-10-CM | POA: Diagnosis not present

## 2022-12-01 DIAGNOSIS — I10 Essential (primary) hypertension: Secondary | ICD-10-CM | POA: Diagnosis not present

## 2022-12-01 DIAGNOSIS — E782 Mixed hyperlipidemia: Secondary | ICD-10-CM | POA: Diagnosis not present

## 2022-12-01 DIAGNOSIS — J45909 Unspecified asthma, uncomplicated: Secondary | ICD-10-CM | POA: Diagnosis not present

## 2022-12-01 DIAGNOSIS — G8929 Other chronic pain: Secondary | ICD-10-CM | POA: Diagnosis not present

## 2022-12-01 DIAGNOSIS — F1011 Alcohol abuse, in remission: Secondary | ICD-10-CM | POA: Diagnosis not present

## 2022-12-05 DIAGNOSIS — E559 Vitamin D deficiency, unspecified: Secondary | ICD-10-CM | POA: Diagnosis not present

## 2022-12-05 DIAGNOSIS — G8929 Other chronic pain: Secondary | ICD-10-CM | POA: Diagnosis not present

## 2022-12-05 DIAGNOSIS — F32A Depression, unspecified: Secondary | ICD-10-CM | POA: Diagnosis not present

## 2022-12-05 DIAGNOSIS — J45909 Unspecified asthma, uncomplicated: Secondary | ICD-10-CM | POA: Diagnosis not present

## 2022-12-05 DIAGNOSIS — M79671 Pain in right foot: Secondary | ICD-10-CM | POA: Diagnosis not present

## 2022-12-05 DIAGNOSIS — M25571 Pain in right ankle and joints of right foot: Secondary | ICD-10-CM | POA: Diagnosis not present

## 2022-12-05 DIAGNOSIS — I10 Essential (primary) hypertension: Secondary | ICD-10-CM | POA: Diagnosis not present

## 2022-12-14 ENCOUNTER — Telehealth: Payer: Self-pay | Admitting: Family Medicine

## 2022-12-14 ENCOUNTER — Other Ambulatory Visit: Payer: Self-pay

## 2022-12-14 DIAGNOSIS — I10 Essential (primary) hypertension: Secondary | ICD-10-CM

## 2022-12-14 DIAGNOSIS — E785 Hyperlipidemia, unspecified: Secondary | ICD-10-CM

## 2022-12-14 MED ORDER — EZETIMIBE 10 MG PO TABS: 10.0000 mg | ORAL_TABLET | Freq: Every day | ORAL | 3 refills | Status: DC

## 2022-12-14 MED ORDER — LISINOPRIL-HYDROCHLOROTHIAZIDE 20-12.5 MG PO TABS: 2.0000 | ORAL_TABLET | Freq: Every day | ORAL | 3 refills | Status: DC

## 2022-12-14 NOTE — Telephone Encounter (Signed)
Medication sent to the patients pharmacy.  ,cma

## 2022-12-14 NOTE — Telephone Encounter (Signed)
Pharmacy called about pt refill on Lisinopril and ezetimibe

## 2022-12-21 DIAGNOSIS — M2141 Flat foot [pes planus] (acquired), right foot: Secondary | ICD-10-CM | POA: Diagnosis not present

## 2022-12-21 DIAGNOSIS — R7303 Prediabetes: Secondary | ICD-10-CM | POA: Diagnosis not present

## 2022-12-21 DIAGNOSIS — M79675 Pain in left toe(s): Secondary | ICD-10-CM | POA: Diagnosis not present

## 2022-12-21 DIAGNOSIS — M76821 Posterior tibial tendinitis, right leg: Secondary | ICD-10-CM | POA: Diagnosis not present

## 2022-12-21 DIAGNOSIS — M79674 Pain in right toe(s): Secondary | ICD-10-CM | POA: Diagnosis not present

## 2022-12-21 DIAGNOSIS — L600 Ingrowing nail: Secondary | ICD-10-CM | POA: Diagnosis not present

## 2022-12-21 DIAGNOSIS — M76829 Posterior tibial tendinitis, unspecified leg: Secondary | ICD-10-CM | POA: Diagnosis not present

## 2022-12-21 DIAGNOSIS — B351 Tinea unguium: Secondary | ICD-10-CM | POA: Diagnosis not present

## 2022-12-21 DIAGNOSIS — M2142 Flat foot [pes planus] (acquired), left foot: Secondary | ICD-10-CM | POA: Diagnosis not present

## 2022-12-22 DIAGNOSIS — J45909 Unspecified asthma, uncomplicated: Secondary | ICD-10-CM | POA: Diagnosis not present

## 2022-12-22 DIAGNOSIS — F32A Depression, unspecified: Secondary | ICD-10-CM | POA: Diagnosis not present

## 2022-12-22 DIAGNOSIS — G8929 Other chronic pain: Secondary | ICD-10-CM | POA: Diagnosis not present

## 2022-12-22 DIAGNOSIS — M79671 Pain in right foot: Secondary | ICD-10-CM | POA: Diagnosis not present

## 2022-12-22 DIAGNOSIS — I10 Essential (primary) hypertension: Secondary | ICD-10-CM | POA: Diagnosis not present

## 2022-12-22 DIAGNOSIS — F1011 Alcohol abuse, in remission: Secondary | ICD-10-CM | POA: Diagnosis not present

## 2022-12-22 DIAGNOSIS — F419 Anxiety disorder, unspecified: Secondary | ICD-10-CM | POA: Diagnosis not present

## 2022-12-22 DIAGNOSIS — E782 Mixed hyperlipidemia: Secondary | ICD-10-CM | POA: Diagnosis not present

## 2022-12-22 DIAGNOSIS — E559 Vitamin D deficiency, unspecified: Secondary | ICD-10-CM | POA: Diagnosis not present

## 2022-12-22 DIAGNOSIS — M25571 Pain in right ankle and joints of right foot: Secondary | ICD-10-CM | POA: Diagnosis not present

## 2023-01-19 DIAGNOSIS — M25571 Pain in right ankle and joints of right foot: Secondary | ICD-10-CM | POA: Diagnosis not present

## 2023-01-19 DIAGNOSIS — G8929 Other chronic pain: Secondary | ICD-10-CM | POA: Diagnosis not present

## 2023-01-19 DIAGNOSIS — E559 Vitamin D deficiency, unspecified: Secondary | ICD-10-CM | POA: Diagnosis not present

## 2023-01-19 DIAGNOSIS — F419 Anxiety disorder, unspecified: Secondary | ICD-10-CM | POA: Diagnosis not present

## 2023-01-19 DIAGNOSIS — J45909 Unspecified asthma, uncomplicated: Secondary | ICD-10-CM | POA: Diagnosis not present

## 2023-01-19 DIAGNOSIS — F1011 Alcohol abuse, in remission: Secondary | ICD-10-CM | POA: Diagnosis not present

## 2023-01-19 DIAGNOSIS — F32A Depression, unspecified: Secondary | ICD-10-CM | POA: Diagnosis not present

## 2023-01-19 DIAGNOSIS — I10 Essential (primary) hypertension: Secondary | ICD-10-CM | POA: Diagnosis not present

## 2023-01-19 DIAGNOSIS — M79671 Pain in right foot: Secondary | ICD-10-CM | POA: Diagnosis not present

## 2023-01-19 DIAGNOSIS — E782 Mixed hyperlipidemia: Secondary | ICD-10-CM | POA: Diagnosis not present

## 2023-01-26 DIAGNOSIS — L298 Other pruritus: Secondary | ICD-10-CM | POA: Diagnosis not present

## 2023-01-26 DIAGNOSIS — M9902 Segmental and somatic dysfunction of thoracic region: Secondary | ICD-10-CM | POA: Diagnosis not present

## 2023-01-26 DIAGNOSIS — M5416 Radiculopathy, lumbar region: Secondary | ICD-10-CM | POA: Diagnosis not present

## 2023-01-26 DIAGNOSIS — L570 Actinic keratosis: Secondary | ICD-10-CM | POA: Diagnosis not present

## 2023-01-26 DIAGNOSIS — L821 Other seborrheic keratosis: Secondary | ICD-10-CM | POA: Diagnosis not present

## 2023-01-26 DIAGNOSIS — M9903 Segmental and somatic dysfunction of lumbar region: Secondary | ICD-10-CM | POA: Diagnosis not present

## 2023-01-26 DIAGNOSIS — M9905 Segmental and somatic dysfunction of pelvic region: Secondary | ICD-10-CM | POA: Diagnosis not present

## 2023-01-30 DIAGNOSIS — M9902 Segmental and somatic dysfunction of thoracic region: Secondary | ICD-10-CM | POA: Diagnosis not present

## 2023-01-30 DIAGNOSIS — M9905 Segmental and somatic dysfunction of pelvic region: Secondary | ICD-10-CM | POA: Diagnosis not present

## 2023-01-30 DIAGNOSIS — M9903 Segmental and somatic dysfunction of lumbar region: Secondary | ICD-10-CM | POA: Diagnosis not present

## 2023-01-30 DIAGNOSIS — M5416 Radiculopathy, lumbar region: Secondary | ICD-10-CM | POA: Diagnosis not present

## 2023-01-31 ENCOUNTER — Other Ambulatory Visit: Payer: Self-pay | Admitting: Family Medicine

## 2023-01-31 DIAGNOSIS — G2581 Restless legs syndrome: Secondary | ICD-10-CM

## 2023-02-13 ENCOUNTER — Telehealth: Payer: Self-pay | Admitting: Family Medicine

## 2023-02-13 NOTE — Telephone Encounter (Signed)
Called [facility] to let them know that patient has established with a new PCP at [facility]. They are going to contact her for refills.

## 2023-02-13 NOTE — Telephone Encounter (Signed)
[facility] called, Please send all medication refill  to [facility].  lisinopril-hydrochlorothiazide (ZESTORETIC) 20-12.5 MG tablet  metoprolol tartrate (LOPRESSOR) 25 MG tablet  rOPINIRole (REQUIP) 2 MG tablet  solifenacin (VESICARE) 5 MG tablet  ezetimibe (ZETIA) 10 MG tablet

## 2023-02-23 ENCOUNTER — Telehealth: Payer: Self-pay | Admitting: Family Medicine

## 2023-02-23 NOTE — Telephone Encounter (Signed)
Spoke with [facility] and advised, per recent TE, that patient has established with new PCP @[facility] . They did not have this noted in patient's record, this has now been updated. Nothing further needed at this time.

## 2023-02-23 NOTE — Telephone Encounter (Signed)
[facility] stating pt need a refill on solifenacin, ezetimibe, metoprolol tartrate, rOPINIRole, lisinopril-hydrochlorothiazide 3 months supply

## 2023-03-14 DIAGNOSIS — H25813 Combined forms of age-related cataract, bilateral: Secondary | ICD-10-CM | POA: Diagnosis not present

## 2023-04-12 DIAGNOSIS — L820 Inflamed seborrheic keratosis: Secondary | ICD-10-CM | POA: Diagnosis not present

## 2023-04-12 DIAGNOSIS — L821 Other seborrheic keratosis: Secondary | ICD-10-CM | POA: Diagnosis not present

## 2023-04-12 DIAGNOSIS — D485 Neoplasm of uncertain behavior of skin: Secondary | ICD-10-CM | POA: Diagnosis not present

## 2023-04-12 DIAGNOSIS — L578 Other skin changes due to chronic exposure to nonionizing radiation: Secondary | ICD-10-CM | POA: Diagnosis not present

## 2023-04-12 DIAGNOSIS — B078 Other viral warts: Secondary | ICD-10-CM | POA: Diagnosis not present

## 2023-04-12 DIAGNOSIS — R208 Other disturbances of skin sensation: Secondary | ICD-10-CM | POA: Diagnosis not present

## 2023-05-03 DIAGNOSIS — M9905 Segmental and somatic dysfunction of pelvic region: Secondary | ICD-10-CM | POA: Diagnosis not present

## 2023-05-03 DIAGNOSIS — M9903 Segmental and somatic dysfunction of lumbar region: Secondary | ICD-10-CM | POA: Diagnosis not present

## 2023-05-03 DIAGNOSIS — M9902 Segmental and somatic dysfunction of thoracic region: Secondary | ICD-10-CM | POA: Diagnosis not present

## 2023-05-03 DIAGNOSIS — M5416 Radiculopathy, lumbar region: Secondary | ICD-10-CM | POA: Diagnosis not present

## 2023-05-04 DIAGNOSIS — B351 Tinea unguium: Secondary | ICD-10-CM | POA: Diagnosis not present

## 2023-05-04 DIAGNOSIS — M79675 Pain in left toe(s): Secondary | ICD-10-CM | POA: Diagnosis not present

## 2023-05-04 DIAGNOSIS — M7671 Peroneal tendinitis, right leg: Secondary | ICD-10-CM | POA: Diagnosis not present

## 2023-05-04 DIAGNOSIS — R7303 Prediabetes: Secondary | ICD-10-CM | POA: Diagnosis not present

## 2023-05-04 DIAGNOSIS — M79674 Pain in right toe(s): Secondary | ICD-10-CM | POA: Diagnosis not present

## 2023-05-08 DIAGNOSIS — M9902 Segmental and somatic dysfunction of thoracic region: Secondary | ICD-10-CM | POA: Diagnosis not present

## 2023-05-08 DIAGNOSIS — M9903 Segmental and somatic dysfunction of lumbar region: Secondary | ICD-10-CM | POA: Diagnosis not present

## 2023-05-08 DIAGNOSIS — M9905 Segmental and somatic dysfunction of pelvic region: Secondary | ICD-10-CM | POA: Diagnosis not present

## 2023-05-08 DIAGNOSIS — M5416 Radiculopathy, lumbar region: Secondary | ICD-10-CM | POA: Diagnosis not present

## 2023-05-24 DIAGNOSIS — M9903 Segmental and somatic dysfunction of lumbar region: Secondary | ICD-10-CM | POA: Diagnosis not present

## 2023-05-24 DIAGNOSIS — M9905 Segmental and somatic dysfunction of pelvic region: Secondary | ICD-10-CM | POA: Diagnosis not present

## 2023-05-24 DIAGNOSIS — M9902 Segmental and somatic dysfunction of thoracic region: Secondary | ICD-10-CM | POA: Diagnosis not present

## 2023-05-24 DIAGNOSIS — M5416 Radiculopathy, lumbar region: Secondary | ICD-10-CM | POA: Diagnosis not present

## 2023-05-29 DIAGNOSIS — M9902 Segmental and somatic dysfunction of thoracic region: Secondary | ICD-10-CM | POA: Diagnosis not present

## 2023-05-29 DIAGNOSIS — M5416 Radiculopathy, lumbar region: Secondary | ICD-10-CM | POA: Diagnosis not present

## 2023-05-29 DIAGNOSIS — M9903 Segmental and somatic dysfunction of lumbar region: Secondary | ICD-10-CM | POA: Diagnosis not present

## 2023-05-29 DIAGNOSIS — M9905 Segmental and somatic dysfunction of pelvic region: Secondary | ICD-10-CM | POA: Diagnosis not present

## 2023-05-31 DIAGNOSIS — M9902 Segmental and somatic dysfunction of thoracic region: Secondary | ICD-10-CM | POA: Diagnosis not present

## 2023-05-31 DIAGNOSIS — M9905 Segmental and somatic dysfunction of pelvic region: Secondary | ICD-10-CM | POA: Diagnosis not present

## 2023-05-31 DIAGNOSIS — M5416 Radiculopathy, lumbar region: Secondary | ICD-10-CM | POA: Diagnosis not present

## 2023-05-31 DIAGNOSIS — M9903 Segmental and somatic dysfunction of lumbar region: Secondary | ICD-10-CM | POA: Diagnosis not present

## 2023-06-07 DIAGNOSIS — M9903 Segmental and somatic dysfunction of lumbar region: Secondary | ICD-10-CM | POA: Diagnosis not present

## 2023-06-07 DIAGNOSIS — M9902 Segmental and somatic dysfunction of thoracic region: Secondary | ICD-10-CM | POA: Diagnosis not present

## 2023-06-07 DIAGNOSIS — M5416 Radiculopathy, lumbar region: Secondary | ICD-10-CM | POA: Diagnosis not present

## 2023-06-07 DIAGNOSIS — M9905 Segmental and somatic dysfunction of pelvic region: Secondary | ICD-10-CM | POA: Diagnosis not present

## 2023-06-12 DIAGNOSIS — M9903 Segmental and somatic dysfunction of lumbar region: Secondary | ICD-10-CM | POA: Diagnosis not present

## 2023-06-12 DIAGNOSIS — M9905 Segmental and somatic dysfunction of pelvic region: Secondary | ICD-10-CM | POA: Diagnosis not present

## 2023-06-12 DIAGNOSIS — M5416 Radiculopathy, lumbar region: Secondary | ICD-10-CM | POA: Diagnosis not present

## 2023-06-12 DIAGNOSIS — M9902 Segmental and somatic dysfunction of thoracic region: Secondary | ICD-10-CM | POA: Diagnosis not present

## 2023-06-14 DIAGNOSIS — M5416 Radiculopathy, lumbar region: Secondary | ICD-10-CM | POA: Diagnosis not present

## 2023-06-14 DIAGNOSIS — M9902 Segmental and somatic dysfunction of thoracic region: Secondary | ICD-10-CM | POA: Diagnosis not present

## 2023-06-14 DIAGNOSIS — M9905 Segmental and somatic dysfunction of pelvic region: Secondary | ICD-10-CM | POA: Diagnosis not present

## 2023-06-14 DIAGNOSIS — M9903 Segmental and somatic dysfunction of lumbar region: Secondary | ICD-10-CM | POA: Diagnosis not present

## 2023-07-04 DIAGNOSIS — M5442 Lumbago with sciatica, left side: Secondary | ICD-10-CM | POA: Diagnosis not present

## 2023-07-04 DIAGNOSIS — G8929 Other chronic pain: Secondary | ICD-10-CM | POA: Diagnosis not present

## 2023-07-04 DIAGNOSIS — Z6841 Body Mass Index (BMI) 40.0 and over, adult: Secondary | ICD-10-CM | POA: Diagnosis not present

## 2023-07-04 DIAGNOSIS — M47816 Spondylosis without myelopathy or radiculopathy, lumbar region: Secondary | ICD-10-CM | POA: Diagnosis not present

## 2023-07-04 DIAGNOSIS — M5441 Lumbago with sciatica, right side: Secondary | ICD-10-CM | POA: Diagnosis not present

## 2023-07-04 DIAGNOSIS — M419 Scoliosis, unspecified: Secondary | ICD-10-CM | POA: Diagnosis not present

## 2023-07-04 DIAGNOSIS — S32020S Wedge compression fracture of second lumbar vertebra, sequela: Secondary | ICD-10-CM | POA: Diagnosis not present

## 2023-07-10 ENCOUNTER — Other Ambulatory Visit: Payer: Self-pay | Admitting: Family Medicine

## 2023-07-10 DIAGNOSIS — G2581 Restless legs syndrome: Secondary | ICD-10-CM

## 2023-07-11 ENCOUNTER — Other Ambulatory Visit: Payer: Self-pay | Admitting: Family Medicine

## 2023-07-12 DIAGNOSIS — M9903 Segmental and somatic dysfunction of lumbar region: Secondary | ICD-10-CM | POA: Diagnosis not present

## 2023-07-12 DIAGNOSIS — M5416 Radiculopathy, lumbar region: Secondary | ICD-10-CM | POA: Diagnosis not present

## 2023-07-12 DIAGNOSIS — M9905 Segmental and somatic dysfunction of pelvic region: Secondary | ICD-10-CM | POA: Diagnosis not present

## 2023-07-12 DIAGNOSIS — M9902 Segmental and somatic dysfunction of thoracic region: Secondary | ICD-10-CM | POA: Diagnosis not present

## 2023-07-14 DIAGNOSIS — M5136 Other intervertebral disc degeneration, lumbar region: Secondary | ICD-10-CM | POA: Diagnosis not present

## 2023-07-14 DIAGNOSIS — M5416 Radiculopathy, lumbar region: Secondary | ICD-10-CM | POA: Diagnosis not present

## 2023-07-17 DIAGNOSIS — M5416 Radiculopathy, lumbar region: Secondary | ICD-10-CM | POA: Diagnosis not present

## 2023-07-17 DIAGNOSIS — M9903 Segmental and somatic dysfunction of lumbar region: Secondary | ICD-10-CM | POA: Diagnosis not present

## 2023-07-17 DIAGNOSIS — M9905 Segmental and somatic dysfunction of pelvic region: Secondary | ICD-10-CM | POA: Diagnosis not present

## 2023-07-17 DIAGNOSIS — M9902 Segmental and somatic dysfunction of thoracic region: Secondary | ICD-10-CM | POA: Diagnosis not present

## 2023-08-01 DIAGNOSIS — M5441 Lumbago with sciatica, right side: Secondary | ICD-10-CM | POA: Diagnosis not present

## 2023-08-01 DIAGNOSIS — M5442 Lumbago with sciatica, left side: Secondary | ICD-10-CM | POA: Diagnosis not present

## 2023-08-01 DIAGNOSIS — G8929 Other chronic pain: Secondary | ICD-10-CM | POA: Diagnosis not present

## 2023-08-08 DIAGNOSIS — M5442 Lumbago with sciatica, left side: Secondary | ICD-10-CM | POA: Diagnosis not present

## 2023-08-08 DIAGNOSIS — G8929 Other chronic pain: Secondary | ICD-10-CM | POA: Diagnosis not present

## 2023-08-08 DIAGNOSIS — M5441 Lumbago with sciatica, right side: Secondary | ICD-10-CM | POA: Diagnosis not present

## 2023-08-25 DIAGNOSIS — M5380 Other specified dorsopathies, site unspecified: Secondary | ICD-10-CM | POA: Diagnosis not present

## 2023-08-25 DIAGNOSIS — M5416 Radiculopathy, lumbar region: Secondary | ICD-10-CM | POA: Diagnosis not present

## 2023-11-13 DIAGNOSIS — M79675 Pain in left toe(s): Secondary | ICD-10-CM | POA: Diagnosis not present

## 2023-11-13 DIAGNOSIS — M76829 Posterior tibial tendinitis, unspecified leg: Secondary | ICD-10-CM | POA: Diagnosis not present

## 2023-11-13 DIAGNOSIS — M76821 Posterior tibial tendinitis, right leg: Secondary | ICD-10-CM | POA: Diagnosis not present

## 2023-11-13 DIAGNOSIS — M79674 Pain in right toe(s): Secondary | ICD-10-CM | POA: Diagnosis not present

## 2023-11-13 DIAGNOSIS — M2142 Flat foot [pes planus] (acquired), left foot: Secondary | ICD-10-CM | POA: Diagnosis not present

## 2023-11-13 DIAGNOSIS — M2141 Flat foot [pes planus] (acquired), right foot: Secondary | ICD-10-CM | POA: Diagnosis not present

## 2023-11-13 DIAGNOSIS — B351 Tinea unguium: Secondary | ICD-10-CM | POA: Diagnosis not present

## 2024-02-07 ENCOUNTER — Ambulatory Visit: Payer: Self-pay | Admitting: Family Medicine

## 2024-02-14 ENCOUNTER — Ambulatory Visit (INDEPENDENT_AMBULATORY_CARE_PROVIDER_SITE_OTHER): Admitting: Family Medicine

## 2024-02-14 ENCOUNTER — Encounter: Payer: Self-pay | Admitting: Family Medicine

## 2024-02-14 VITALS — BP 118/58 | HR 63 | Ht 65.0 in | Wt 292.0 lb

## 2024-02-14 DIAGNOSIS — Z125 Encounter for screening for malignant neoplasm of prostate: Secondary | ICD-10-CM | POA: Diagnosis not present

## 2024-02-14 DIAGNOSIS — I700 Atherosclerosis of aorta: Secondary | ICD-10-CM

## 2024-02-14 DIAGNOSIS — R7303 Prediabetes: Secondary | ICD-10-CM | POA: Diagnosis not present

## 2024-02-14 DIAGNOSIS — I10 Essential (primary) hypertension: Secondary | ICD-10-CM

## 2024-02-14 DIAGNOSIS — E559 Vitamin D deficiency, unspecified: Secondary | ICD-10-CM | POA: Diagnosis not present

## 2024-02-14 DIAGNOSIS — E785 Hyperlipidemia, unspecified: Secondary | ICD-10-CM

## 2024-02-14 DIAGNOSIS — Z23 Encounter for immunization: Secondary | ICD-10-CM | POA: Diagnosis not present

## 2024-02-14 DIAGNOSIS — S81801A Unspecified open wound, right lower leg, initial encounter: Secondary | ICD-10-CM

## 2024-02-14 DIAGNOSIS — Z1211 Encounter for screening for malignant neoplasm of colon: Secondary | ICD-10-CM

## 2024-02-14 DIAGNOSIS — F0153 Vascular dementia, unspecified severity, with mood disturbance: Secondary | ICD-10-CM | POA: Diagnosis not present

## 2024-02-14 DIAGNOSIS — M47816 Spondylosis without myelopathy or radiculopathy, lumbar region: Secondary | ICD-10-CM | POA: Diagnosis not present

## 2024-02-14 DIAGNOSIS — N3946 Mixed incontinence: Secondary | ICD-10-CM | POA: Diagnosis not present

## 2024-02-14 DIAGNOSIS — Z0000 Encounter for general adult medical examination without abnormal findings: Secondary | ICD-10-CM

## 2024-02-14 LAB — URINALYSIS, ROUTINE W REFLEX MICROSCOPIC
Bilirubin, UA: NEGATIVE
Glucose, UA: NEGATIVE
Ketones, UA: NEGATIVE
Leukocytes,UA: NEGATIVE
Nitrite, UA: NEGATIVE
Protein,UA: NEGATIVE
RBC, UA: NEGATIVE
Specific Gravity, UA: >1.03 — ABNORMAL HIGH (ref 1.005–1.030)
Urobilinogen, Ur: 0.2 mg/dL (ref 0.2–1.0)
pH, UA: 5.5 (ref 5.0–7.5)

## 2024-02-14 LAB — MICROALBUMIN, URINE WAIVED
Creatinine, Urine Waived: 300 mg/dL (ref 10–300)
Microalb, Ur Waived: 30 mg/L — ABNORMAL HIGH (ref 0–19)
Microalb/Creat Ratio: <30 mg/g (ref <30)

## 2024-02-14 LAB — BAYER DCA HB A1C WAIVED: HB A1C (BAYER DCA - WAIVED): 5.5 % (ref 4.8–5.6)

## 2024-02-14 NOTE — Patient Instructions (Signed)
  Paul Cook , Thank you for taking time to come for your Medicare Wellness Visit. I appreciate your ongoing commitment to your health goals. Please review the following plan we discussed and let me know if I can assist you in the future.    This is a list of the screening recommended for you and due dates:  Health Maintenance  Topic Date Due   Zoster (Shingles) Vaccine (1 of 2) 09/05/1970   Cologuard (Stool DNA test)  Never done   Medicare Annual Wellness Visit  08/19/2023   DTaP/Tdap/Td vaccine (2 - Td or Tdap) 04/17/2024   Flu Shot  05/17/2024   Pneumonia Vaccine  Completed   HPV Vaccine  Aged Out   Meningitis B Vaccine  Aged Out   Colon Cancer Screening  Discontinued   COVID-19 Vaccine  Discontinued

## 2024-02-14 NOTE — Assessment & Plan Note (Signed)
 Under good control on current regimen. Continue current regimen. Continue to monitor. Call with any concerns. Will check on refills with the pharmacy. Labs drawn today.

## 2024-02-14 NOTE — Progress Notes (Signed)
 BP (!) 118/58   Pulse 63   Ht 5\' 5"  (1.651 m)   Wt 292 lb (132.5 kg)   SpO2 93%   BMI 48.59 kg/m    Subjective:    Patient ID: Paul Cook, male    DOB: 01-10-1951, 74 y.o.   MRN: [id number]  HPI: Paul Cook is a 73 y.o. male who presents today to establish care. He was following with [facility] until last year when he was seeing [facility]. He has been seeing his primary pretty regularly, but notes are not available at this time.   Chief Complaint  Patient presents with   Establish Care   Impaired Fasting Glucose HbA1C:  Lab Results  Component Value Date   HGBA1C 5.8 05/11/2022   Duration of elevated blood sugar: chronic Polydipsia: no Polyuria: no Weight change: no Visual disturbance: no Glucose Monitoring: no Diabetic Education: Not Completed Family history of diabetes: yes  HYPERTENSION / HYPERLIPIDEMIA Satisfied with current treatment? yes Duration of hypertension: chronic BP monitoring frequency: not checking BP medication side effects: no Past BP meds: amlodipine , lisinopril , hydrochlorothiazide , metoprolol  Duration of hyperlipidemia: chronic Cholesterol medication side effects: no Cholesterol supplements: none Past cholesterol medications: zetia  Medication compliance: good compliance Aspirin : yes Recent stressors: no Recurrent headaches: no Visual changes: no Palpitations: no Dyspnea: no Chest pain: no Lower extremity edema: no Dizzy/lightheaded: no  DEPRESSION Mood status: stable Satisfied with current treatment?: yes Symptom severity: moderate  Duration of current treatment : N/A Depressed mood: yes Anxious mood: no Anhedonia: no Significant weight loss or gain: no Insomnia: no  Fatigue: yes Feelings of worthlessness or guilt: no Impaired concentration/indecisiveness: no Suicidal ideations: no Hopelessness: no Crying spells: no    02/14/2024    8:14 AM 08/30/2022    3:52 PM 08/22/2022    1:54 PM 08/18/2022   12:44 PM 05/11/2022     2:42 PM  Depression screen PHQ 2/9  Decreased Interest 1 0 0 0 0  Down, Depressed, Hopeless 0 0 0 0 0  PHQ - 2 Score 1 0 0 0 0     Active Ambulatory Problems    Diagnosis Date Noted   Chronic insomnia 06/12/2013   Cervical disc disorder with radiculopathy of cervical region 06/12/2013   Morbid obesity with BMI of 50.0-59.9, adult (HCC) 06/12/2013   Seasonal allergies 06/12/2013   Anxiety and depression 06/12/2013   IBS (irritable bowel syndrome) 07/30/2013   Obstructive sleep apnea 12/17/2013   Essential hypertension 01/14/2014   Restless legs 03/19/2015   GERD (gastroesophageal reflux disease) 12/18/2015   Plantar fasciitis 08/29/2016   Prediabetes 06/28/2018   Vitamin D  deficiency 06/29/2018   HLD (hyperlipidemia) 11/09/2018   Venous stasis dermatitis of right lower extremity 05/15/2020   Cyst of left kidney 05/29/2020   Fatty liver 05/29/2020   Coronary atherosclerosis due to calcified coronary lesion 06/10/2020   Aortic atherosclerosis (HCC) 06/10/2020   Lung nodule seen on imaging study 06/10/2020   Vascular dementia with depressed mood (HCC) 07/02/2021   OCD (obsessive compulsive disorder) 07/04/2021   Elevated liver enzymes 07/04/2021   Urine incontinence 07/28/2021   Facet arthritis, degenerative, lumbar spine 10/12/2021   Overactive bladder 10/12/2021   Daytime hypersomnolence 10/12/2021   History of alcoholism (HCC) 09/04/2022   Resolved Ambulatory Problems    Diagnosis Date Noted   Daytime somnolence 06/12/2013   Screening for colon cancer 06/12/2013   Dysuria 06/12/2013   Candidal dermatitis 02/07/2014   Routine general medical examination at a health care facility 04/17/2014  Abdominal discomfort 05/01/2014   Diverticulitis 10/20/2014   History of colonic polyps 10/20/2014   External hemorrhoid 11/10/2014   Abnormal CT scan, chest 11/21/2014   Diverticulosis of colon without hemorrhage 11/21/2014   Diverticulitis of colon without hemorrhage  03/09/2015   Boil of neck 04/06/2016   Skin sensitivity 04/06/2016   Left sided numbness 07/18/2016   Dizziness 07/18/2016   Diarrhea 10/25/2016   History of alcohol abuse 09/06/2018   Chronic pain of right ankle 09/06/2018   Stress 11/09/2018   Memory loss 07/17/2019   Right ankle swelling 12/17/2019   Bilateral leg edema 05/15/2020   Gallstone 05/29/2020   Coronary artery calcification seen on CT scan 07/13/2020   DOE (dyspnea on exertion) 08/06/2020   Abnormal EKG 08/06/2020   Posterior tibial tendinitis of right lower extremity 10/12/2021   LVH (left ventricular hypertrophy) 10/12/2021   Past Medical History:  Diagnosis Date   Adenomatous colon polyp    Adenomatous rectal polyp    Asthma    Depression    Diverticulosis    History of chicken pox    Hyperlipidemia    Hypertension    Lung nodule    OSA (obstructive sleep apnea)    Sleep apnea    Past Surgical History:  Procedure Laterality Date   COLONOSCOPY     COLONOSCOPY W/ POLYPECTOMY  2015   [facility], benign   HERNIA REPAIR     inguinal   POLYPECTOMY     TONSILLECTOMY     age 70    Outpatient Encounter Medications as of 02/14/2024  Medication Sig   amLODipine  (NORVASC ) 5 MG tablet TAKE 1 TABLET(5 MG) BY MOUTH DAILY   ASPIRIN  LOW DOSE 81 MG EC tablet TAKE 1 TABLET BY MOUTH ONCE DAILY   ezetimibe  (ZETIA ) 10 MG tablet Take 1 tablet (10 mg total) by mouth daily.   gabapentin  (NEURONTIN ) 300 MG capsule Take 1 capsule (300 mg total) by mouth daily.   lisinopril -hydrochlorothiazide  (ZESTORETIC ) 20-12.5 MG tablet Take 2 tablets by mouth daily.   metoprolol  tartrate (LOPRESSOR ) 25 MG tablet Take 1 tablet (25 mg total) by mouth 2 (two) times daily. D/c xl due to cost   rOPINIRole  (REQUIP ) 2 MG tablet TAKE 3 TABLETS(6 MG) BY MOUTH AT BEDTIME   solifenacin  (VESICARE ) 5 MG tablet Take 1 tablet (5 mg total) by mouth daily.   No facility-administered encounter medications on file as of 02/14/2024.   Allergies  Allergen  Reactions   Nsaids Other (See Comments)    GI Issues   Social History   Socioeconomic History   Marital status: Married    Spouse name: Waris Mchaffie   Number of children: 1   Years of education: 16   Highest education level: Bachelor's degree (e.g., BA, AB, BS)  Occupational History   Not on file  Tobacco Use   Smoking status: Never    Passive exposure: Never   Smokeless tobacco: Never  Vaping Use   Vaping status: Never Used  Substance and Sexual Activity   Alcohol use: Yes    Alcohol/week: 4.0 standard drinks of alcohol    Types: 4 Cans of beer per week    Comment: Alcoholic in the past   Drug use: No   Sexual activity: Not Currently  Other Topics Concern   Not on file  Social History Narrative   Lives in [place] with wife, Raenell Bump 23 years as of 10/2018. 1 daughter in [place].      Work - Systems analyst, now Engineer, materials  From [place]         Social Drivers of Health   Financial Resource Strain: Low Risk  (02/14/2024)   Overall Financial Resource Strain (CARDIA)    Difficulty of Paying Living Expenses: Not hard at all  Food Insecurity: No Food Insecurity (02/14/2024)   Hunger Vital Sign    Worried About Running Out of Food in the Last Year: Never true    Ran Out of Food in the Last Year: Never true  Transportation Needs: No Transportation Needs (02/14/2024)   PRAPARE - Administrator, Civil Service (Medical): No    Lack of Transportation (Non-Medical): No  Physical Activity: Inactive (02/14/2024)   Exercise Vital Sign    Days of Exercise per Week: 0 days    Minutes of Exercise per Session: 0 min  Stress: No Stress Concern Present (02/14/2024)   [employer] of Occupational Health - Occupational Stress Questionnaire    Feeling of Stress : Only a little  Social Connections: Moderately Integrated (02/14/2024)   Social Connection and Isolation Panel [NHANES]    Frequency of Communication with Friends and Family: Three times a week     Frequency of Social Gatherings with Friends and Family: Once a week    Attends Religious Services: Never    Database administrator or Organizations: Yes    Attends Banker Meetings: Never    Marital Status: Married   Family History  Problem Relation Age of Onset   Depression Mother    Hypertension Father    Heart disease Father    Stroke Father    Cancer Maternal Grandmother        ovarian?   Cancer Paternal Grandfather        prostate   Prostate cancer Paternal Grandfather    Colon cancer Neg Hx    Colon polyps Neg Hx      Review of Systems  Musculoskeletal:  Positive for arthralgias. Negative for back pain, gait problem, joint swelling, myalgias, neck pain and neck stiffness.  Psychiatric/Behavioral: Negative.      Per HPI unless specifically indicated above     Objective:    BP (!) 118/58   Pulse 63   Ht 5\' 5"  (1.651 m)   Wt 292 lb (132.5 kg)   SpO2 93%   BMI 48.59 kg/m   Wt Readings from Last 3 Encounters:  02/14/24 292 lb (132.5 kg)  08/30/22 300 lb (136.1 kg)  08/22/22 299 lb 3.2 oz (135.7 kg)    Physical Exam Vitals and nursing note reviewed.  Constitutional:      General: He is not in acute distress.    Appearance: Normal appearance. He is obese. He is not ill-appearing, toxic-appearing or diaphoretic.  HENT:     Head: Normocephalic and atraumatic.     Right Ear: External ear normal.     Left Ear: External ear normal.     Nose: Nose normal.     Mouth/Throat:     Mouth: Mucous membranes are moist.     Pharynx: Oropharynx is clear.  Eyes:     General: No scleral icterus.       Right eye: No discharge.        Left eye: No discharge.     Extraocular Movements: Extraocular movements intact.     Conjunctiva/sclera: Conjunctivae normal.     Pupils: Pupils are equal, round, and reactive to light.  Cardiovascular:     Rate and Rhythm: Normal rate and regular rhythm.  Pulses: Normal pulses.     Heart sounds: Normal heart sounds. No  murmur heard.    No friction rub. No gallop.  Pulmonary:     Effort: Pulmonary effort is normal. No respiratory distress.     Breath sounds: Normal breath sounds. No stridor. No wheezing, rhonchi or rales.  Chest:     Chest wall: No tenderness.  Musculoskeletal:        General: Normal range of motion.     Cervical back: Normal range of motion and neck supple.  Skin:    General: Skin is warm and dry.     Capillary Refill: Capillary refill takes less than 2 seconds.     Coloration: Skin is not jaundiced or pale.     Findings: No bruising, erythema, lesion or rash.  Neurological:     General: No focal deficit present.     Mental Status: He is alert and oriented to person, place, and time. Mental status is at baseline.  Psychiatric:        Mood and Affect: Mood normal.        Behavior: Behavior normal.        Thought Content: Thought content normal.        Judgment: Judgment normal.     Results for orders placed or performed in visit on 05/11/22  Comprehensive metabolic panel   Collection Time: 05/11/22  3:13 PM  Result Value Ref Range   Sodium 139 135 - 145 mEq/L   Potassium 4.0 3.5 - 5.1 mEq/L   Chloride 99 96 - 112 mEq/L   CO2 30 19 - 32 mEq/L   Glucose, Bld 99 70 - 99 mg/dL   BUN 21 6 - 23 mg/dL   Creatinine, Ser 1.61 0.40 - 1.50 mg/dL   Total Bilirubin 0.6 0.2 - 1.2 mg/dL   Alkaline Phosphatase 64 39 - 117 U/L   AST 66 (H) 0 - 37 U/L   ALT 98 (H) 0 - 53 U/L   Total Protein 6.9 6.0 - 8.3 g/dL   Albumin 4.5 3.5 - 5.2 g/dL   GFR 09.60 >45.40 mL/min   Calcium  9.4 8.4 - 10.5 mg/dL  Lipid Profile   Collection Time: 05/11/22  3:13 PM  Result Value Ref Range   Cholesterol 150 0 - 200 mg/dL   Triglycerides 981.1 (H) 0.0 - 149.0 mg/dL   HDL 91.47 >82.95 mg/dL   VLDL 62.1 0.0 - 30.8 mg/dL   LDL Cholesterol 78 0 - 99 mg/dL   Total CHOL/HDL Ratio 4    NonHDL 108.39   HgB A1c   Collection Time: 05/11/22  3:13 PM  Result Value Ref Range   Hgb A1c MFr Bld 5.8 4.6 - 6.5 %       Assessment & Plan:   Problem List Items Addressed This Visit       Cardiovascular and Mediastinum   Essential hypertension   Under good control on current regimen. Continue current regimen. Continue to monitor. Call with any concerns. Will check on refills with the pharmacy. Labs drawn today.         Relevant Orders   Comprehensive metabolic panel with GFR   CBC with Differential/Platelet   TSH   Microalbumin, Urine Waived   Ambulatory referral to Home Health   AMB Referral [facility] Care Management   Aortic atherosclerosis (HCC)   Will keep his BP and cholesterol under good control. Continue to monitor. Call with any concerns.       Relevant Orders  Comprehensive metabolic panel with GFR   CBC with Differential/Platelet   Ambulatory referral to Home Health   AMB Referral [facility]I Care Management   Vascular dementia with depressed mood (HCC)   Not currently on any medication. Will check labs and get home health and social workers involved. Call with any concerns. Recheck in 1-3 months.         Musculoskeletal and Integument   Facet arthritis, degenerative, lumbar spine   Will get home health out to help with PT. Call with any concerns. Recheck in 1-3 months.         Other   Prediabetes   Rechecking labs today. Await results. Treat as needed.       Relevant Orders   Comprehensive metabolic panel with GFR   CBC with Differential/Platelet   Microalbumin, Urine Waived   Bayer DCA Hb A1c Waived   Ambulatory referral to Home Health   AMB Referral [facility]I Care Management   Vitamin D  deficiency   Rechecking labs today. Await results. Treat as needed.       Relevant Orders   Comprehensive metabolic panel with GFR   CBC with Differential/Platelet   VITAMIN D  25 Hydroxy (Vit-D Deficiency, Fractures)   Ambulatory referral to Home Health   AMB Referral [facility] Care Management   HLD (hyperlipidemia)   Rechecking labs today. Await results. Treat as needed.       Relevant  Orders   Comprehensive metabolic panel with GFR   CBC with Differential/Platelet   Lipid Panel w/o Chol/HDL Ratio   Ambulatory referral to Home Health   AMB Referral [facility] Care Management   Urine incontinence   Need pull ups. Will send Rx. Will check urine to check to see if there's any infection.       Relevant Orders   Urinalysis, Routine w reflex microscopic   Ambulatory referral to Home Health   AMB Referral [facility] Care Management   Other Visit Diagnoses       Encounter for Medicare annual wellness exam    -  Primary   Preventative care discussed today as below.     Screening for prostate cancer       PSA drawn today.   Relevant Orders   PSA   Ambulatory referral to Home Health     Screening for colon cancer       Cologuard ordered today.   Relevant Orders   Cologuard     Open wound of right lower leg, initial encounter       Healing well. Due for Td- given today.   Relevant Orders   Td : Tetanus/diphtheria >7yo Preservative  free        Follow up plan: Return 1-3 months for follow up.

## 2024-02-14 NOTE — Assessment & Plan Note (Signed)
 Need pull ups. Will send Rx. Will check urine to check to see if there's any infection.

## 2024-02-14 NOTE — Assessment & Plan Note (Signed)
 Rechecking labs today. Await results. Treat as needed.

## 2024-02-14 NOTE — Assessment & Plan Note (Signed)
Will keep his BP and cholesterol under good control. Continue to monitor. Call with any concerns.  

## 2024-02-14 NOTE — Assessment & Plan Note (Signed)
 Not currently on any medication. Will check labs and get home health and social workers involved. Call with any concerns. Recheck in 1-3 months.

## 2024-02-14 NOTE — Progress Notes (Signed)
 Subjective:   Paul Cook is a 73 y.o. male who presents for Medicare Annual/Subsequent preventive examination.  Visit Complete: In person  Patient Medicare AWV questionnaire was completed by the patient on 02/14/24; I have confirmed that all information answered by patient is correct and no changes since this date.  Cardiac Risk Factors include: advanced age (>38men, >29 women)     Objective:    Today's Vitals   02/14/24 0829 02/14/24 0851  BP: (!) 143/109 (!) 118/58  Pulse: 63   SpO2: 93%   Weight: 292 lb (132.5 kg)   Height: 5\' 5"  (1.651 m)   PainSc: 0-No pain    Body mass index is 48.59 kg/m.     02/14/2024    8:25 AM 08/18/2022   12:40 PM 08/17/2021    1:29 PM 07/09/2021    4:03 PM 07/08/2021   10:43 AM 07/03/2020    9:44 AM 04/23/2020   11:27 AM  Advanced Directives  Does Patient Have a Medical Advance Directive? Yes Yes Yes Yes Yes No Yes  Type of Special educational needs teacher of [place];Living will Living will Living will Healthcare Power of [place];Living will  Healthcare Power of Attorney  Does patient want to make changes to medical advance directive?  No - Patient declined  No - Guardian declined No - Guardian declined  No - Patient declined  Copy of Healthcare Power of Attorney in Chart?  No - copy requested No - copy requested  No - copy requested  No - copy requested  Would patient like information on creating a medical advance directive?      Yes (MAU/Ambulatory/Procedural Areas - Information given)     Current Medications (verified) Outpatient Encounter Medications as of 02/14/2024  Medication Sig   amLODipine  (NORVASC ) 5 MG tablet TAKE 1 TABLET(5 MG) BY MOUTH DAILY   ASPIRIN  LOW DOSE 81 MG EC tablet TAKE 1 TABLET BY MOUTH ONCE DAILY   ezetimibe  (ZETIA ) 10 MG tablet Take 1 tablet (10 mg total) by mouth daily.   gabapentin  (NEURONTIN ) 300 MG capsule Take 1 capsule (300 mg total) by mouth daily.   lisinopril -hydrochlorothiazide  (ZESTORETIC ) 20-12.5 MG  tablet Take 2 tablets by mouth daily.   metoprolol  tartrate (LOPRESSOR ) 25 MG tablet Take 1 tablet (25 mg total) by mouth 2 (two) times daily. D/c xl due to cost   rOPINIRole  (REQUIP ) 2 MG tablet TAKE 3 TABLETS(6 MG) BY MOUTH AT BEDTIME   solifenacin  (VESICARE ) 5 MG tablet Take 1 tablet (5 mg total) by mouth daily.   No facility-administered encounter medications on file as of 02/14/2024.    Allergies (verified) Nsaids   History: Past Medical History:  Diagnosis Date   Abnormal EKG 08/06/2020   Adenomatous colon polyp    tubular   Adenomatous rectal polyp    tubular   Asthma    Bilateral leg edema 05/15/2020   Chronic pain of right ankle 09/06/2018   Coronary artery calcification seen on CT scan 07/13/2020   Depression    Diverticulosis    DOE (dyspnea on exertion) 08/06/2020   Gallstone 05/29/2020   GERD (gastroesophageal reflux disease)    History of chicken pox    Hyperlipidemia    Hypertension    Lung nodule    CT 10/2014   LVH (left ventricular hypertrophy) 10/12/2021   OSA (obstructive sleep apnea)    Posterior tibial tendinitis of right lower extremity 10/12/2021   Sleep apnea    wears cpap   Vitamin D  deficiency  Past Surgical History:  Procedure Laterality Date   COLONOSCOPY     COLONOSCOPY W/ POLYPECTOMY  2015   [facility], benign   HERNIA REPAIR     inguinal   POLYPECTOMY     TONSILLECTOMY     age 74    Family History  Problem Relation Age of Onset   Depression Mother    Hypertension Father    Heart disease Father    Stroke Father    Cancer Maternal Grandmother        ovarian?   Cancer Paternal Grandfather        prostate   Prostate cancer Paternal Grandfather    Colon cancer Neg Hx    Colon polyps Neg Hx    Social History   Socioeconomic History   Marital status: Married    Spouse name: Fields Kammerzell   Number of children: 1   Years of education: 16   Highest education level: Bachelor's degree (e.g., BA, AB, BS)  Occupational History   Not  on file  Tobacco Use   Smoking status: Never    Passive exposure: Never   Smokeless tobacco: Never  Vaping Use   Vaping status: Never Used  Substance and Sexual Activity   Alcohol use: Yes    Alcohol/week: 4.0 standard drinks of alcohol    Types: 4 Cans of beer per week    Comment: Alcoholic in the past   Drug use: No   Sexual activity: Not Currently  Other Topics Concern   Not on file  Social History Narrative   Lives in [place] with wife, Raenell Bump 23 years as of 10/2018. 1 daughter in [place].      Work - Systems analyst, now Engineer, materials      From [employer]         Social Drivers of [employer]: Low Risk  (02/14/2024)   Overall Financial Resource Strain (CARDIA)    Difficulty of Paying Living Expenses: Not hard at all  Food Insecurity: No Food Insecurity (02/14/2024)   Hunger Vital Sign    Worried About Running Out of Food in the Last Year: Never true    Ran Out of Food in the Last Year: Never true  Transportation Needs: No Transportation Needs (02/14/2024)   PRAPARE - Administrator, Civil Service (Medical): No    Lack of Transportation (Non-Medical): No  Physical Activity: Inactive (02/14/2024)   Exercise Vital Sign    Days of Exercise per Week: 0 days    Minutes of Exercise per Session: 0 min  Stress: No Stress Concern Present (02/14/2024)   [employer] of Occupational Health - Occupational Stress Questionnaire    Feeling of Stress : Only a little  Social Connections: Moderately Integrated (02/14/2024)   Social Connection and Isolation Panel [NHANES]    Frequency of Communication with Friends and Family: Three times a week    Frequency of Social Gatherings with Friends and Family: Once a week    Attends Religious Services: Never    Database administrator or Organizations: Yes    Attends Banker Meetings: Never    Marital Status: Married    Tobacco Counseling Counseling given: Not Answered   Clinical  Intake:     Pain Score: 0-No pain                  Activities of Daily Living    02/14/2024    8:18 AM  In your present state of  health, do you have any difficulty performing the following activities:  Hearing? 0  Vision? 0  Comment Reading glasses  Difficulty concentrating or making decisions? 1  Walking or climbing stairs? 0  Dressing or bathing? 0  Doing errands, shopping? 1  Preparing Food and eating ? N  Using the Toilet? N  In the past six months, have you accidently leaked urine? N  Do you have problems with loss of bowel control? N  Managing your Medications? N  Managing your Finances? N  Housekeeping or managing your Housekeeping? N    Patient Care Team: Solomon Dupre, DO as PCP - General (Family Medicine) End, Veryl Gottron, MD as PCP - Cardiology (Cardiology)  Indicate any recent Medical Services you may have received from other than [facility] providers in the past year (date may be approximate).     Assessment:   This is a routine wellness examination for [place].  Hearing/Vision screen No results found.   Goals Addressed   None   Depression Screen    02/14/2024    8:14 AM 08/30/2022    3:52 PM 08/22/2022    1:54 PM 08/18/2022   12:44 PM 05/11/2022    2:42 PM 02/09/2022   11:43 AM 08/17/2021    1:26 PM  PHQ 2/9 Scores  PHQ - 2 Score 1 0 0 0 0 0 0    Fall Risk    02/14/2024    8:22 AM 08/30/2022    3:51 PM 08/22/2022    1:54 PM 08/18/2022   12:40 PM 05/11/2022    2:42 PM  Fall Risk   Falls in the past year? 0 0 0 0 1  Number falls in past yr: 0 0 0 0 1  Injury with Fall? 0 0 0 0 0  Risk for fall due to :  No Fall Risks No Fall Risks No Fall Risks History of fall(s)  Follow up  Falls evaluation completed Falls evaluation completed Follow up appointment Falls evaluation completed    MEDICARE RISK AT HOME: Medicare Risk at Home Any stairs in or around the home?: Yes If so, are there any without handrails?: Yes Home free of loose throw rugs  in walkways, pet beds, electrical cords, etc?: Yes Adequate lighting in your home to reduce risk of falls?: Yes Life alert?: No Use of a cane, walker or w/c?: Yes (Sometimes uses a cane) Grab bars in the bathroom?: No Shower chair or bench in shower?: Yes Elevated toilet seat or a handicapped toilet?: Yes  TIMED UP AND GO:  Was the test performed?  Yes  Length of time to ambulate 10 feet: 30 sec Gait slow and steady without use of assistive device    Cognitive Function:    04/04/2018   11:08 AM  MMSE - Mini Mental State Exam  Orientation to time 5  Orientation to Place 5  Registration 3  Attention/ Calculation 5  Recall 3  Language- name 2 objects 2  Language- repeat 1  Language- follow 3 step command 3  Language- read & follow direction 1  Write a sentence 1  Copy design 1  Total score 30        02/14/2024    8:22 AM 08/18/2022   12:58 PM 08/17/2021    1:32 PM 04/23/2020   11:20 AM 04/23/2019    3:06 PM  6CIT Screen  What Year? 4 points 0 points 0 points 0 points 0 points  What month? 0 points 0 points 0 points 0 points  0 points  What time? 0 points 0 points 0 points  0 points  Count back from 20 0 points 0 points 0 points 0 points 0 points  Months in reverse 0 points 0 points 0 points 0 points 0 points  Repeat phrase 0 points 0 points 0 points 0 points 0 points  Total Score 4 points 0 points 0 points  0 points    Immunizations Immunization History  Administered Date(s) Administered   Fluad Quad(high Dose 65+) 07/22/2019, 07/01/2020, 07/02/2021   Influenza Whole 06/19/2013   Influenza, High Dose Seasonal PF 06/28/2018, 07/02/2021   Influenza,inj,Quad PF,6+ Mos 07/15/2014, 07/18/2016   Influenza-Unspecified 06/18/2015   PFIZER(Purple Top)SARS-COV-2 Vaccination 12/03/2019, 12/24/2019   Pneumococcal Conjugate PCV 7 03/30/2017   Pneumococcal Conjugate-13 03/30/2017   Pneumococcal Polysaccharide-23 04/04/2018   Tdap 04/17/2014   Zoster, Live 12/18/2013    TDAP  status: Completed at today's visit  Flu- due in the fall  Pneumococcal vaccine status: Up to date  Covid-19 vaccine status: Declined, Education has been provided regarding the importance of this vaccine but patient still declined. Advised may receive this vaccine at local pharmacy or Health Dept.or vaccine clinic. Aware to provide a copy of the vaccination record if obtained from local pharmacy or Health Dept. Verbalized acceptance and understanding.  Qualifies for Shingles Vaccine? Yes   Zostavax completed No    Screening Tests Health Maintenance  Topic Date Due   Zoster Vaccines- Shingrix  (1 of 2) 09/05/1970   Fecal DNA (Cologuard)  Never done   DTaP/Tdap/Td (2 - Td or Tdap) 04/17/2024   INFLUENZA VACCINE  05/17/2024   Medicare Annual Wellness (AWV)  02/13/2025   Pneumonia Vaccine 36+ Years old  Completed   HPV VACCINES  Aged Out   Meningococcal B Vaccine  Aged Out   Colonoscopy  Discontinued   COVID-19 Vaccine  Discontinued    Health Maintenance  Health Maintenance Due  Topic Date Due   Zoster Vaccines- Shingrix  (1 of 2) 09/05/1970   Fecal DNA (Cologuard)  Never done    Colorectal cancer screening: Cologuard ordered today.  Lung Cancer Screening: (Low Dose CT Chest recommended if Age 75-80 years, 20 pack-year currently smoking OR have quit w/in 15years.) does not qualify.   Additional Screening:  Dental Screening: Recommended annual dental exams for proper oral hygiene  Community Resource Referral / Chronic Care Management: CRR required this visit?  No   CCM required this visit?  No     Plan:     I have personally reviewed and noted the following in the patient's chart:   Medical and social history Use of alcohol, tobacco or illicit drugs  Current medications and supplements including opioid prescriptions. Patient is not currently taking opioid prescriptions. Functional ability and status Nutritional status Physical activity Advanced directives List of  other physicians Hospitalizations, surgeries, and ER visits in previous 12 months Vitals Screenings to include cognitive, depression, and falls Referrals and appointments  In addition, I have reviewed and discussed with patient certain preventive protocols, quality metrics, and best practice recommendations. A written personalized care plan for preventive services as well as general preventive health recommendations were provided to patient.     Terre Ferri, DO   02/14/2024   After Visit Summary: (In Person-Printed) AVS printed and given to the patient  Nurse Notes: N/A

## 2024-02-14 NOTE — Assessment & Plan Note (Signed)
 Will get home health out to help with PT. Call with any concerns. Recheck in 1-3 months.

## 2024-02-15 ENCOUNTER — Telehealth: Payer: Self-pay | Admitting: *Deleted

## 2024-02-15 ENCOUNTER — Other Ambulatory Visit: Payer: Self-pay | Admitting: Family Medicine

## 2024-02-15 LAB — CBC WITH DIFFERENTIAL/PLATELET
Basophils Absolute: 0 10*3/uL (ref 0.0–0.2)
Basos: 0 %
EOS (ABSOLUTE): 0.1 10*3/uL (ref 0.0–0.4)
Eos: 1 %
Hematocrit: 43.5 % (ref 37.5–51.0)
Hemoglobin: 14.5 g/dL (ref 13.0–17.7)
Immature Grans (Abs): 0 10*3/uL (ref 0.0–0.1)
Immature Granulocytes: 0 %
Lymphocytes Absolute: 2.5 10*3/uL (ref 0.7–3.1)
Lymphs: 26 %
MCH: 31 pg (ref 26.6–33.0)
MCHC: 33.3 g/dL (ref 31.5–35.7)
MCV: 93 fL (ref 79–97)
Monocytes Absolute: 0.7 10*3/uL (ref 0.1–0.9)
Monocytes: 8 %
Neutrophils Absolute: 6.2 10*3/uL (ref 1.4–7.0)
Neutrophils: 65 %
Platelets: 200 10*3/uL (ref 150–450)
RBC: 4.67 x10E6/uL (ref 4.14–5.80)
RDW: 13.4 % (ref 11.6–15.4)
WBC: 9.6 10*3/uL (ref 3.4–10.8)

## 2024-02-15 LAB — LIPID PANEL W/O CHOL/HDL RATIO
Cholesterol, Total: 146 mg/dL (ref 100–199)
HDL: 39 mg/dL — ABNORMAL LOW (ref >39)
LDL Chol Calc (NIH): 78 mg/dL (ref 0–99)
Triglycerides: 168 mg/dL — ABNORMAL HIGH (ref 0–149)
VLDL Cholesterol Cal: 29 mg/dL (ref 5–40)

## 2024-02-15 LAB — COMPREHENSIVE METABOLIC PANEL WITH GFR
ALT: 24 IU/L (ref 0–44)
AST: 23 IU/L (ref 0–40)
Albumin: 4.2 g/dL (ref 3.8–4.8)
Alkaline Phosphatase: 75 IU/L (ref 44–121)
BUN/Creatinine Ratio: 16 (ref 10–24)
BUN: 13 mg/dL (ref 8–27)
Bilirubin Total: 0.3 mg/dL (ref 0.0–1.2)
CO2: 26 mmol/L (ref 20–29)
Calcium: 9 mg/dL (ref 8.6–10.2)
Chloride: 101 mmol/L (ref 96–106)
Creatinine, Ser: 0.8 mg/dL (ref 0.76–1.27)
Globulin, Total: 2.4 g/dL (ref 1.5–4.5)
Glucose: 79 mg/dL (ref 70–99)
Potassium: 4.2 mmol/L (ref 3.5–5.2)
Sodium: 140 mmol/L (ref 134–144)
Total Protein: 6.6 g/dL (ref 6.0–8.5)
eGFR: 94 mL/min/{1.73_m2} (ref >59)

## 2024-02-15 LAB — TSH: TSH: 5.37 u[IU]/mL — ABNORMAL HIGH (ref 0.450–4.500)

## 2024-02-15 LAB — PSA: Prostate Specific Ag, Serum: 0.8 ng/mL (ref 0.0–4.0)

## 2024-02-15 LAB — VITAMIN D 25 HYDROXY (VIT D DEFICIENCY, FRACTURES): Vit D, 25-Hydroxy: 28.4 ng/mL — ABNORMAL LOW (ref 30.0–100.0)

## 2024-02-15 MED ORDER — VITAMIN D (ERGOCALCIFEROL) 1.25 MG (50000 UNIT) PO CAPS: 50000.0000 [IU] | ORAL_CAPSULE | ORAL | 0 refills | Status: AC

## 2024-02-15 NOTE — Progress Notes (Signed)
 Complex Care Management Note Care Guide Note  02/15/2024 Name: Paul Cook MRN: [id number] DOB: 05/21/1951   Complex Care Management Outreach Attempts: An unsuccessful telephone outreach was attempted today to offer the patient information about available complex care management services.  Follow Up Plan:  Additional outreach attempts will be made to offer the patient complex care management information and services.   Encounter Outcome:  No Answer  Kandis Ormond, CMA [place]  [facility], [facility] Guide Direct Dial: [phone]  Fax: [phone] Website: [email or web address]

## 2024-02-16 NOTE — Progress Notes (Signed)
 Complex Care Management Note Care Guide Note  02/16/2024 Name: Corran Persell MRN: [id number] DOB: 1951/05/10   Complex Care Management Outreach Attempts: A second unsuccessful outreach was attempted today to offer the patient with information about available complex care management services.  Follow Up Plan:  Additional outreach attempts will be made to offer the patient complex care management information and services.   Encounter Outcome:  No Answer  Kandis Ormond, CMA [place]  [facility], [facility] Guide Direct Dial: [phone]  Fax: [phone] Website: [email or web address]

## 2024-02-16 NOTE — Progress Notes (Signed)
 Complex Care Management Note  Care Guide Note 02/16/2024 Name: Paul Cook MRN: [id number] DOB: Feb 19, 1951  Paul Cook is a 73 y.o. year old male who sees Solomon Dupre, DO for primary care. I reached out to Sharmaine Dearth by phone today to offer complex care management services.  Paul Cook was given information about Complex Care Management services today including:   The Complex Care Management services include support from the care team which includes your Nurse Care Manager, Clinical Social Worker, or Pharmacist.  The Complex Care Management team is here to help remove barriers to the health concerns and goals most important to you. Complex Care Management services are voluntary, and the patient may decline or stop services at any time by request to their care team member.   Complex Care Management Consent Status: Patient agreed to services and verbal consent obtained.   Follow up plan:  Telephone appointment with complex care management team member scheduled for:  03/04/2024  Encounter Outcome:  Patient Scheduled  Kandis Ormond, CMA [place]  [facility], [facility] Guide Direct Dial: [phone]  Fax: [phone] Website: [email or web address]

## 2024-02-21 ENCOUNTER — Telehealth: Payer: Self-pay

## 2024-02-21 NOTE — Telephone Encounter (Signed)
-----   Message from Terre Ferri sent at 02/15/2024  4:12 PM EDT ----- Please let his wife know that his labs generally look really good. His cholesterol looks great and his blood count and prostate markers are normal. His vitamin D  is low so I've sent him in a supplement to be taken 1x a week and his thyroid  was very slightly underactive. This is nothing we need to treat right now, we'll just recheck it next time. Thanks!

## 2024-03-04 ENCOUNTER — Telehealth: Payer: Self-pay | Admitting: *Deleted

## 2024-03-04 ENCOUNTER — Encounter: Payer: Self-pay | Admitting: *Deleted

## 2024-04-02 ENCOUNTER — Other Ambulatory Visit: Payer: Self-pay | Admitting: *Deleted

## 2024-04-02 NOTE — Patient Outreach (Signed)
 Phone call to patient's spouse for scheduled appointment. Patient's spouse did not want to proceed with full assessment, however did request information for incontinent supplies and home health.   Collaboration phone call to [facility] confirming that they have adult diapers and pads available for pick up Monday-Friday from 9am-2pm. CSW also provided contacted information for the [facility] Dementia Family Support [phone]. Patient's spouse discussed plan to follow up with patient's provider to request possible referral for home health.  CSW's contact number provided with any additional community resource needs.  Lachanda Buczek, LCSW [place]  [facility], [facility] Licensed Clinical Social Worker  Direct Dial: [phone]

## 2024-05-13 DIAGNOSIS — M542 Cervicalgia: Secondary | ICD-10-CM | POA: Diagnosis not present

## 2024-05-13 DIAGNOSIS — M5459 Other low back pain: Secondary | ICD-10-CM | POA: Diagnosis not present

## 2024-05-13 DIAGNOSIS — M9903 Segmental and somatic dysfunction of lumbar region: Secondary | ICD-10-CM | POA: Diagnosis not present

## 2024-05-13 DIAGNOSIS — M9901 Segmental and somatic dysfunction of cervical region: Secondary | ICD-10-CM | POA: Diagnosis not present

## 2024-05-15 DIAGNOSIS — B351 Tinea unguium: Secondary | ICD-10-CM | POA: Diagnosis not present

## 2024-05-15 DIAGNOSIS — M79674 Pain in right toe(s): Secondary | ICD-10-CM | POA: Diagnosis not present

## 2024-05-15 DIAGNOSIS — M79675 Pain in left toe(s): Secondary | ICD-10-CM | POA: Diagnosis not present

## 2024-05-16 DIAGNOSIS — M542 Cervicalgia: Secondary | ICD-10-CM | POA: Diagnosis not present

## 2024-05-16 DIAGNOSIS — M9903 Segmental and somatic dysfunction of lumbar region: Secondary | ICD-10-CM | POA: Diagnosis not present

## 2024-05-16 DIAGNOSIS — M5459 Other low back pain: Secondary | ICD-10-CM | POA: Diagnosis not present

## 2024-05-16 DIAGNOSIS — M9901 Segmental and somatic dysfunction of cervical region: Secondary | ICD-10-CM | POA: Diagnosis not present

## 2024-05-27 ENCOUNTER — Ambulatory Visit
Admission: RE | Admit: 2024-05-27 | Discharge: 2024-05-27 | Disposition: A | Discharge status: Home / Self Care | Source: Ambulatory Visit | Attending: Family Medicine | Admitting: Family Medicine

## 2024-05-27 ENCOUNTER — Ambulatory Visit: Admitting: Family Medicine

## 2024-05-27 ENCOUNTER — Encounter: Payer: Self-pay | Admitting: Family Medicine

## 2024-05-27 VITALS — BP 123/82 | HR 80 | Temp 98.0°F | Ht 65.0 in | Wt 300.4 lb

## 2024-05-27 DIAGNOSIS — R7989 Other specified abnormal findings of blood chemistry: Secondary | ICD-10-CM | POA: Diagnosis not present

## 2024-05-27 DIAGNOSIS — F0153 Vascular dementia, unspecified severity, with mood disturbance: Secondary | ICD-10-CM | POA: Diagnosis not present

## 2024-05-27 DIAGNOSIS — E559 Vitamin D deficiency, unspecified: Secondary | ICD-10-CM | POA: Diagnosis not present

## 2024-05-27 DIAGNOSIS — M47816 Spondylosis without myelopathy or radiculopathy, lumbar region: Secondary | ICD-10-CM

## 2024-05-27 DIAGNOSIS — S0990XA Unspecified injury of head, initial encounter: Secondary | ICD-10-CM | POA: Diagnosis not present

## 2024-05-27 DIAGNOSIS — R7303 Prediabetes: Secondary | ICD-10-CM | POA: Diagnosis not present

## 2024-05-27 DIAGNOSIS — N3946 Mixed incontinence: Secondary | ICD-10-CM

## 2024-05-27 DIAGNOSIS — I700 Atherosclerosis of aorta: Secondary | ICD-10-CM

## 2024-05-27 DIAGNOSIS — I10 Essential (primary) hypertension: Secondary | ICD-10-CM

## 2024-05-27 DIAGNOSIS — E785 Hyperlipidemia, unspecified: Secondary | ICD-10-CM

## 2024-05-27 NOTE — Patient Instructions (Signed)
 Head over to get CT of head [address], [place], [place] [address] Phone: [phone]

## 2024-05-27 NOTE — Assessment & Plan Note (Signed)
 Doing well last visit. Continue to monitor. Call with any concerns.

## 2024-05-27 NOTE — Assessment & Plan Note (Signed)
 Will keep his BP and cholesterol under good control. Continue to monitor. Call with any concerns.

## 2024-05-27 NOTE — Assessment & Plan Note (Signed)
 Doing great last time. Not on medicine. Continue diet and exercise. Call with any concerns.

## 2024-05-27 NOTE — Assessment & Plan Note (Signed)
 Rechecking labs today. Await results. Treat as needed.

## 2024-05-27 NOTE — Progress Notes (Signed)
 BP 123/82   Pulse 80   Temp 98 F (36.7 C) (Oral)   Ht 5' 5 (1.651 m)   Wt (!) 300 lb 6.4 oz (136.3 kg)   SpO2 95%   BMI 49.99 kg/m    Subjective:    Patient ID: Paul Cook, male    DOB: August 25, 1951, 73 y.o.   MRN: [id number]  HPI: Paul Cook is a 73 y.o. male  Chief Complaint  Patient presents with   Hypertension   Hyperlipidemia   Fall    4 Days ago. Neck pain and Buttock pain   Known herniated discs in his low back. Has been having some buttock pain and neck pain for about 4 days since he fell. Tripped over a ledge. Wife was not in the room when it happened, but he did hit his head on a wooden floor. He never falls- has really good balance. He notes that he has had pains in he neck off and on for years. He denies any dizziness or lightheadedness. No headaches. No changes in his vision. He is otherwise feeling well.  His wife notes that PT did not come out when it was ordered last time. She notes that she has trouble being able to get him to get up and move, and he does better with his back when they come out.   HYPERTENSION / HYPERLIPIDEMIA Satisfied with current treatment? yes Duration of hypertension: chronic BP monitoring frequency: rarely BP medication side effects: no Past BP meds: lisinopril , hydrochlorothiazide , metoprolol , amlodipine  Duration of hyperlipidemia: chronic Cholesterol medication side effects: no Cholesterol supplements: none Past cholesterol medications: zetia  Medication compliance: excellent compliance Aspirin : no Recent stressors: no Recurrent headaches: no Visual changes: no Palpitations: no Dyspnea: no Chest pain: no Lower extremity edema: no Dizzy/lightheaded: no  Relevant past medical, surgical, family and social history reviewed and updated as indicated. Interim medical history since our last visit reviewed. Allergies and medications reviewed and updated.  Review of Systems  Constitutional: Negative.   Respiratory: Negative.     Cardiovascular: Negative.   Gastrointestinal: Negative.   Musculoskeletal:  Positive for myalgias, neck pain and neck stiffness. Negative for arthralgias, back pain, gait problem and joint swelling.  Skin: Negative.   Neurological: Negative.   Psychiatric/Behavioral: Negative.      Per HPI unless specifically indicated above     Objective:    BP 123/82   Pulse 80   Temp 98 F (36.7 C) (Oral)   Ht 5' 5 (1.651 m)   Wt (!) 300 lb 6.4 oz (136.3 kg)   SpO2 95%   BMI 49.99 kg/m   Wt Readings from Last 3 Encounters:  05/27/24 (!) 300 lb 6.4 oz (136.3 kg)  02/14/24 292 lb (132.5 kg)  08/30/22 300 lb (136.1 kg)    Physical Exam Vitals and nursing note reviewed.  Constitutional:      General: He is not in acute distress.    Appearance: Normal appearance. He is obese. He is not ill-appearing, toxic-appearing or diaphoretic.  HENT:     Head: Normocephalic and atraumatic.     Right Ear: External ear normal.     Left Ear: External ear normal.     Nose: Nose normal.     Mouth/Throat:     Mouth: Mucous membranes are moist.     Pharynx: Oropharynx is clear.  Eyes:     General: No scleral icterus.       Right eye: No discharge.        Left  eye: No discharge.     Extraocular Movements: Extraocular movements intact.     Conjunctiva/sclera: Conjunctivae normal.     Pupils: Pupils are equal, round, and reactive to light.  Cardiovascular:     Rate and Rhythm: Normal rate and regular rhythm.     Pulses: Normal pulses.     Heart sounds: Normal heart sounds. No murmur heard.    No friction rub. No gallop.  Pulmonary:     Effort: Pulmonary effort is normal. No respiratory distress.     Breath sounds: Normal breath sounds. No stridor. No wheezing, rhonchi or rales.  Chest:     Chest wall: No tenderness.  Musculoskeletal:        General: Normal range of motion.     Cervical back: Normal range of motion and neck supple.  Skin:    General: Skin is warm and dry.     Capillary Refill:  Capillary refill takes less than 2 seconds.     Coloration: Skin is not jaundiced or pale.     Findings: No bruising, erythema, lesion or rash.  Neurological:     General: No focal deficit present.     Mental Status: He is alert and oriented to person, place, and time. Mental status is at baseline.  Psychiatric:        Mood and Affect: Mood normal.        Behavior: Behavior normal.        Thought Content: Thought content normal.        Judgment: Judgment normal.     Results for orders placed or performed in visit on 02/14/24  Comprehensive metabolic panel with GFR   Collection Time: 02/14/24  9:26 AM  Result Value Ref Range   Glucose 79 70 - 99 mg/dL   BUN 13 8 - 27 mg/dL   Creatinine, Ser 9.19 0.76 - 1.27 mg/dL   eGFR 94 >40 fO/fpw/8.26   BUN/Creatinine Ratio 16 10 - 24   Sodium 140 134 - 144 mmol/L   Potassium 4.2 3.5 - 5.2 mmol/L   Chloride 101 96 - 106 mmol/L   CO2 26 20 - 29 mmol/L   Calcium  9.0 8.6 - 10.2 mg/dL   Total Protein 6.6 6.0 - 8.5 g/dL   Albumin 4.2 3.8 - 4.8 g/dL   Globulin, Total 2.4 1.5 - 4.5 g/dL   Bilirubin Total 0.3 0.0 - 1.2 mg/dL   Alkaline Phosphatase 75 44 - 121 IU/L   AST 23 0 - 40 IU/L   ALT 24 0 - 44 IU/L  CBC with Differential/Platelet   Collection Time: 02/14/24  9:26 AM  Result Value Ref Range   WBC 9.6 3.4 - 10.8 x10E3/uL   RBC 4.67 4.14 - 5.80 x10E6/uL   Hemoglobin 14.5 13.0 - 17.7 g/dL   Hematocrit 56.4 62.4 - 51.0 %   MCV 93 79 - 97 fL   MCH 31.0 26.6 - 33.0 pg   MCHC 33.3 31.5 - 35.7 g/dL   RDW 86.5 88.3 - 84.5 %   Platelets 200 150 - 450 x10E3/uL   Neutrophils 65 Not Estab. %   Lymphs 26 Not Estab. %   Monocytes 8 Not Estab. %   Eos 1 Not Estab. %   Basos 0 Not Estab. %   Neutrophils Absolute 6.2 1.4 - 7.0 x10E3/uL   Lymphocytes Absolute 2.5 0.7 - 3.1 x10E3/uL   Monocytes Absolute 0.7 0.1 - 0.9 x10E3/uL   EOS (ABSOLUTE) 0.1 0.0 - 0.4 x10E3/uL   Basophils  Absolute 0.0 0.0 - 0.2 x10E3/uL   Immature Granulocytes 0 Not Estab.  %   Immature Grans (Abs) 0.0 0.0 - 0.1 x10E3/uL  Lipid Panel w/o Chol/HDL Ratio   Collection Time: 02/14/24  9:26 AM  Result Value Ref Range   Cholesterol, Total 146 100 - 199 mg/dL   Triglycerides 831 (H) 0 - 149 mg/dL   HDL 39 (L) >60 mg/dL   VLDL Cholesterol Cal 29 5 - 40 mg/dL   LDL Chol Calc (NIH) 78 0 - 99 mg/dL  PSA   Collection Time: 02/14/24  9:26 AM  Result Value Ref Range   Prostate Specific Ag, Serum 0.8 0.0 - 4.0 ng/mL  TSH   Collection Time: 02/14/24  9:26 AM  Result Value Ref Range   TSH 5.370 (H) 0.450 - 4.500 uIU/mL  Microalbumin, Urine Waived   Collection Time: 02/14/24  9:26 AM  Result Value Ref Range   Microalb, Ur Waived 30 (H) 0 - 19 mg/L   Creatinine, Urine Waived 300 10 - 300 mg/dL   Microalb/Creat Ratio <30 <30 mg/g  Bayer DCA Hb A1c Waived   Collection Time: 02/14/24  9:26 AM  Result Value Ref Range   HB A1C (BAYER DCA - WAIVED) 5.5 4.8 - 5.6 %  VITAMIN D  25 Hydroxy (Vit-D Deficiency, Fractures)   Collection Time: 02/14/24  9:26 AM  Result Value Ref Range   Vit D, 25-Hydroxy 28.4 (L) 30.0 - 100.0 ng/mL  Urinalysis, Routine w reflex microscopic   Collection Time: 02/14/24  9:26 AM  Result Value Ref Range   Specific Gravity, UA >1.030 (H) 1.005 - 1.030   pH, UA 5.5 5.0 - 7.5   Color, UA Yellow Yellow   Appearance Ur Clear Clear   Leukocytes,UA Negative Negative   Protein,UA Negative Negative/Trace   Glucose, UA Negative Negative   Ketones, UA Negative Negative   RBC, UA Negative Negative   Bilirubin, UA Negative Negative   Urobilinogen, Ur 0.2 0.2 - 1.0 mg/dL   Nitrite, UA Negative Negative   Microscopic Examination Comment       Assessment & Plan:   Problem List Items Addressed This Visit       Cardiovascular and Mediastinum   Essential hypertension - Primary   Under good control on current regimen. Continue current regimen. Continue to monitor. Call with any concerns. Refills up to date. Labs drawn today.        Relevant Orders    Ambulatory referral to Home Health   Aortic atherosclerosis (HCC)   Will keep his BP and cholesterol under good control. Continue to monitor. Call with any concerns.       Relevant Orders   Ambulatory referral to Home Health   Vascular dementia with depressed mood (HCC)   Stable. Has help at home. Would benefit from home health PT- ordered today. Call with any concerns.       Relevant Orders   Ambulatory referral to Home Health   CT HEAD WO CONTRAST ( )     Musculoskeletal and Integument   Facet arthritis, degenerative, lumbar spine   Would benefit from home health PT. Ordered today. Call with any concerns.       Relevant Orders   Ambulatory referral to Home Health     Other   Prediabetes   Doing great last time. Not on medicine. Continue diet and exercise. Call with any concerns.       Relevant Orders   Ambulatory referral to Home Health   Vitamin D   deficiency   Rechecking labs today. Await results. Treat as needed.       Relevant Orders   Ambulatory referral to Home Health   VITAMIN D  25 Hydroxy (Vit-D Deficiency, Fractures)   HLD (hyperlipidemia)   Doing well last visit. Continue to monitor. Call with any concerns.       Relevant Orders   Ambulatory referral to Home Health   Urine incontinence   Stable. Insurance does not cover depends. Continue to monitor.       Relevant Orders   Ambulatory referral to Home Health   Other Visit Diagnoses       Injury of head, initial encounter       Fell 3 days ago. Poor historian. Will get CT head to R/o bleed.   Relevant Orders   CT HEAD WO CONTRAST ( )     Abnormal thyroid  blood test       Rechecking labs today. Await results.   Relevant Orders   TSH        Follow up plan: Return in about 3 months (around 08/27/2024).

## 2024-05-27 NOTE — Assessment & Plan Note (Signed)
 Stable. Insurance does not cover depends. Continue to monitor.

## 2024-05-27 NOTE — Assessment & Plan Note (Signed)
 Would benefit from home health PT. Ordered today. Call with any concerns.

## 2024-05-27 NOTE — Assessment & Plan Note (Signed)
 Stable. Has help at home. Would benefit from home health PT- ordered today. Call with any concerns.

## 2024-05-27 NOTE — Assessment & Plan Note (Signed)
 Under good control on current regimen. Continue current regimen. Continue to monitor. Call with any concerns. Refills up to date. Labs drawn today.

## 2024-05-28 ENCOUNTER — Telehealth: Payer: Self-pay | Admitting: *Deleted

## 2024-05-28 DIAGNOSIS — M9903 Segmental and somatic dysfunction of lumbar region: Secondary | ICD-10-CM | POA: Diagnosis not present

## 2024-05-28 DIAGNOSIS — M5459 Other low back pain: Secondary | ICD-10-CM | POA: Diagnosis not present

## 2024-05-28 DIAGNOSIS — M542 Cervicalgia: Secondary | ICD-10-CM | POA: Diagnosis not present

## 2024-05-28 DIAGNOSIS — M9901 Segmental and somatic dysfunction of cervical region: Secondary | ICD-10-CM | POA: Diagnosis not present

## 2024-05-28 LAB — TSH: TSH: 2.88 u[IU]/mL (ref 0.450–4.500)

## 2024-05-28 LAB — VITAMIN D 25 HYDROXY (VIT D DEFICIENCY, FRACTURES): Vit D, 25-Hydroxy: 28.7 ng/mL — ABNORMAL LOW (ref 30.0–100.0)

## 2024-05-28 NOTE — Patient Outreach (Signed)
 Patient's provider requested a call to patient's spouse to discuss possible financial resources to assists with patient's care.  Patient's spouse contacted who states that she is in need of general resources overall that will assist with patient's care.  Appointment scheduled for 06/12/24 at 1pm.   Lenn Mean, LCSW [place]  [facility], [facility] Licensed Clinical Social Worker  Direct Dial: [phone]

## 2024-05-29 ENCOUNTER — Telehealth: Payer: Self-pay

## 2024-05-29 NOTE — Telephone Encounter (Signed)
 Copied from CRM [id number]. Topic: Clinical - Medical Advice >> May 29, 2024  2:56 PM Montie POUR wrote: Reason for CRM:  [facility] is calling to let Dr. Vicci know that Mr. Batiz has no physical therapy needs. They will not pick him up for home health

## 2024-05-29 NOTE — Telephone Encounter (Signed)
 Please let his wife know. She requested this particularly

## 2024-05-30 DIAGNOSIS — M9903 Segmental and somatic dysfunction of lumbar region: Secondary | ICD-10-CM | POA: Diagnosis not present

## 2024-05-30 DIAGNOSIS — M542 Cervicalgia: Secondary | ICD-10-CM | POA: Diagnosis not present

## 2024-05-30 DIAGNOSIS — M5459 Other low back pain: Secondary | ICD-10-CM | POA: Diagnosis not present

## 2024-05-30 DIAGNOSIS — M9901 Segmental and somatic dysfunction of cervical region: Secondary | ICD-10-CM | POA: Diagnosis not present

## 2024-06-02 ENCOUNTER — Ambulatory Visit: Payer: Self-pay | Admitting: Family Medicine

## 2024-06-04 DIAGNOSIS — M9903 Segmental and somatic dysfunction of lumbar region: Secondary | ICD-10-CM | POA: Diagnosis not present

## 2024-06-04 DIAGNOSIS — M542 Cervicalgia: Secondary | ICD-10-CM | POA: Diagnosis not present

## 2024-06-04 DIAGNOSIS — M9901 Segmental and somatic dysfunction of cervical region: Secondary | ICD-10-CM | POA: Diagnosis not present

## 2024-06-04 DIAGNOSIS — M5459 Other low back pain: Secondary | ICD-10-CM | POA: Diagnosis not present

## 2024-06-05 DIAGNOSIS — M9901 Segmental and somatic dysfunction of cervical region: Secondary | ICD-10-CM | POA: Diagnosis not present

## 2024-06-05 DIAGNOSIS — M9903 Segmental and somatic dysfunction of lumbar region: Secondary | ICD-10-CM | POA: Diagnosis not present

## 2024-06-05 DIAGNOSIS — M5459 Other low back pain: Secondary | ICD-10-CM | POA: Diagnosis not present

## 2024-06-05 DIAGNOSIS — M542 Cervicalgia: Secondary | ICD-10-CM | POA: Diagnosis not present

## 2024-06-10 DIAGNOSIS — M9903 Segmental and somatic dysfunction of lumbar region: Secondary | ICD-10-CM | POA: Diagnosis not present

## 2024-06-10 DIAGNOSIS — M9901 Segmental and somatic dysfunction of cervical region: Secondary | ICD-10-CM | POA: Diagnosis not present

## 2024-06-10 DIAGNOSIS — M542 Cervicalgia: Secondary | ICD-10-CM | POA: Diagnosis not present

## 2024-06-10 DIAGNOSIS — M5459 Other low back pain: Secondary | ICD-10-CM | POA: Diagnosis not present

## 2024-06-12 ENCOUNTER — Other Ambulatory Visit: Payer: Self-pay | Admitting: *Deleted

## 2024-06-13 NOTE — Patient Instructions (Signed)
 Visit Information  Thank you for taking time to visit with me today. Please don't hesitate to contact me if I can be of assistance to you before our next scheduled appointment.  Our next appointment is by telephone on 07/01/24 at 10am Please call the care guide team at [phone] if you need to cancel or reschedule your appointment.   Following is a copy of your care plan:   Goals Addressed             This Visit's Progress    VBCI Social Work Care Plan       Problems:   Lacks knowledge of how to connect to additional in home care resources  CSW Clinical Goal(s):   Over the next 90 days the Caregiver will work with Child psychotherapist to address concerns related to in home care supports.  Interventions:  Level of Care Concerns in a patient with Dementia Current level of care: Home with other family or significant other(s): spouse  Evaluation of patient's unmet needs in current living environment ADL's Assessed needs, level of care concerns, how currently meeting needs and barriers to care Caregiver stress acknowledged Solution-Focused Strategies employed:discussed referral to [facility] companionship services as well as respite care through [facility] Patient's spouse declines facility care options at this time  Patient Goals/Self-Care Activities:  Patient's spouse provided with contact for the [facility] program to complete enrollment  Plan:   Telephone follow up appointment with care management team member scheduled for:  07/01/24        Please call the Suicide and Crisis Lifeline: 988 call the [place]  National Suicide Prevention Lifeline: (762) 492-8414 or TTY: (865) 274-6097 TTY 520 233 5878) to talk to a trained counselor call 1-800-273-TALK (toll free, 24 hour hotline) if you are experiencing a Mental Health or Behavioral Health Crisis or need someone to talk to.  Patient verbalizes understanding of instructions and care plan provided today and agrees to view in  MyChart. Active MyChart status and patient understanding of how to access instructions and care plan via MyChart confirmed with patient.     Ancel Easler, LCSW [place]  [facility], [facility] Licensed Clinical Social Worker  Direct Dial: [phone]

## 2024-06-13 NOTE — Patient Outreach (Signed)
 Complex Care Management   Visit Note  06/13/2024  Name:  Paul Cook MRN: [id number] DOB: 03/06/1951  Situation: Referral received for Complex Care Management related to Dementia  and need for additional in home support. I obtained verbal consent from Caregiver.  Visit completed with Caregiver  on the phone on 05/2724.  Background:   Past Medical History:  Diagnosis Date   Abnormal EKG 08/06/2020   Adenomatous colon polyp    tubular   Adenomatous rectal polyp    tubular   Asthma    Bilateral leg edema 05/15/2020   Chronic pain of right ankle 09/06/2018   Coronary artery calcification seen on CT scan 07/13/2020   Depression    Diverticulosis    DOE (dyspnea on exertion) 08/06/2020   Gallstone 05/29/2020   GERD (gastroesophageal reflux disease)    History of chicken pox    Hyperlipidemia    Hypertension    Lung nodule    CT 10/2014   LVH (left ventricular hypertrophy) 10/12/2021   OSA (obstructive sleep apnea)    Posterior tibial tendinitis of right lower extremity 10/12/2021   Sleep apnea    wears cpap   Vitamin D  deficiency     Assessment: Patient Reported Symptoms:  Cognitive Cognitive Status: Able to follow simple commands, Difficulties with attention and concentration, Poor judgment in daily scenarios Cognitive/Intellectual Conditions Management [RPT]: Other Other: vascular dementia-   Health Maintenance Behaviors: Annual physical exam Healing Pattern: Slow  Neurological Neurological Review of Symptoms: No symptoms reported Neurological Comment: patient has vascular dementia, was followed by a neurologist several years ago  HEENT HEENT Symptoms Reported: No symptoms reported      Cardiovascular Cardiovascular Symptoms Reported: No symptoms reported    Respiratory Respiratory Symptoms Reported: No symptoms reported    Endocrine Endocrine Symptoms Reported: No symptoms reported    Gastrointestinal Gastrointestinal Symptoms Reported: No symptoms reported       Genitourinary Genitourinary Symptoms Reported: Incontinence Genitourinary Management Strategies: Incontinence garment/pad Genitourinary Comment: Urinary incontinence fairly frequent-  Integumentary Integumentary Symptoms Reported: No symptoms reported    Musculoskeletal Musculoskelatal Symptoms Reviewed: No symptoms reported Additional Musculoskeletal Details: labors when he walks becuase he is over weight-can walk bu slowly        Psychosocial Psychosocial Symptoms Reported: Other Other Psychosocial Conditions: Per patient's spouse, patient has OCD-cuts up his clothes because he thinks they are too tight. behaviors getting worse- does not want treatment at this time   Major Change/Loss/Stressor/Fears (CP): Medical condition, self Behaviors When Feeling Stressed/Fearful: withdraws Quality of Family Relationships: supportive Do you feel physically threatened by others?: No    06/13/2024    PHQ2-9 Depression Screening   Little interest or pleasure in doing things Not at all  Feeling down, depressed, or hopeless Not at all  PHQ-2 - Total Score 0  Trouble falling or staying asleep, or sleeping too much    Feeling tired or having little energy    Poor appetite or overeating     Feeling bad about yourself - or that you are a failure or have let yourself or your family down    Trouble concentrating on things, such as reading the newspaper or watching television    Moving or speaking so slowly that other people could have noticed.  Or the opposite - being so fidgety or restless that you have been moving around a lot more than usual    Thoughts that you would be better off dead, or hurting yourself in some way  PHQ2-9 Total Score    If you checked off any problems, how difficult have these problems made it for you to do your work, take care of things at home, or get along with other people    Depression Interventions/Treatment        05/27/2024    1:23 PM 02/14/2024   10:38 AM 05/15/2020     3:42 PM 01/21/2020    2:37 PM  GAD 7 : Generalized Anxiety Score  Nervous, Anxious, on Edge 0 1 0 0  Control/stop worrying 0 0 0 0  Worry too much - different things 0 0 0 0  Trouble relaxing 0 0 0 0  Restless 0 0 0 0  Easily annoyed or irritable 0 0 0 0  Afraid - awful might happen 0 0 0 0  Total GAD 7 Score 0 1 0 0  Anxiety Difficulty Not difficult at all Somewhat difficult Not difficult at all Not difficult at all     There were no vitals filed for this visit.  Medications Reviewed Today     Reviewed by Ermalinda Lenn HERO, LCSW (Social Worker) on 06/12/24 at 1321  Med List Status: <None>   Medication Order Taking? Sig Documenting Provider Last Dose Status Informant  amLODipine  (NORVASC ) 5 MG tablet [id number] Yes TAKE 1 TABLET(5 MG) BY MOUTH DAILY Hope Merle, MD  Active   ASPIRIN  LOW DOSE 81 MG EC tablet [id number] Yes TAKE 1 TABLET BY MOUTH ONCE DAILY McLean-Scocuzza, Randine SAILOR, MD  Active   ezetimibe  (ZETIA ) 10 MG tablet [id number] Yes Take 1 tablet (10 mg total) by mouth daily. Hope Merle, MD  Active   gabapentin  (NEURONTIN ) 300 MG capsule [id number] Yes Take 1 capsule (300 mg total) by mouth daily. Hope Merle, MD  Active   lisinopril -hydrochlorothiazide  (ZESTORETIC ) 20-12.5 MG tablet [id number] Yes Take 2 tablets by mouth daily. Hope Merle, MD  Active   metoprolol  tartrate (LOPRESSOR ) 25 MG tablet [id number] Yes Take 1 tablet (25 mg total) by mouth 2 (two) times daily. D/c xl due to cost McLean-Scocuzza, Randine SAILOR, MD  Active   rOPINIRole  (REQUIP ) 2 MG tablet [id number] Yes TAKE 3 TABLETS(6 MG) BY MOUTH AT BEDTIME Hope Merle, MD  Active   solifenacin  (VESICARE ) 5 MG tablet [id number] Yes Take 1 tablet (5 mg total) by mouth daily. McLean-Scocuzza, Randine SAILOR, MD  Active   Vitamin D , Ergocalciferol , (DRISDOL ) 1.25 MG (50000 UNIT) CAPS capsule [id number] Yes Take 1 capsule (50,000 Units total) by mouth every 7 (seven) days. Vicci Duwaine SQUIBB, DO  Active             Recommendation:    PCP Follow-up Specialty provider follow-up as scheduled Follow up with [facility] for companionship care Follow up with [facility] for regarding respite care referral  Follow Up Plan:   Telephone follow up appointment date/time:  07/01/24  Lenn Ermalinda, LCSW [place]  [facility], [facility] Licensed Clinical Social Worker  Direct Dial: [phone]

## 2024-06-14 ENCOUNTER — Other Ambulatory Visit: Payer: Self-pay

## 2024-06-14 DIAGNOSIS — E785 Hyperlipidemia, unspecified: Secondary | ICD-10-CM

## 2024-06-14 DIAGNOSIS — N3281 Overactive bladder: Secondary | ICD-10-CM

## 2024-06-14 DIAGNOSIS — G2581 Restless legs syndrome: Secondary | ICD-10-CM

## 2024-06-14 DIAGNOSIS — I10 Essential (primary) hypertension: Secondary | ICD-10-CM

## 2024-06-14 NOTE — Telephone Encounter (Signed)
 Copied from CRM [id number]. Topic: Clinical - Medication Question >> Jun 14, 2024 10:51 AM Myrick T wrote: Reason for CRM: patients wife called stated patient needs his medication. She did not have the name of the meds but said it was a bubble pack. Spouse said patient really needs his meds as the pharmacy sent the request days ago. Please f/u with patients wife to assist her with getting this med refill request

## 2024-06-18 NOTE — Telephone Encounter (Signed)
 Reviewed patient's chart. Looks like all of patient meds are due to be refilled.

## 2024-06-20 ENCOUNTER — Other Ambulatory Visit: Payer: Self-pay | Admitting: Family Medicine

## 2024-06-20 DIAGNOSIS — G2581 Restless legs syndrome: Secondary | ICD-10-CM

## 2024-06-20 DIAGNOSIS — I10 Essential (primary) hypertension: Secondary | ICD-10-CM

## 2024-06-20 DIAGNOSIS — E785 Hyperlipidemia, unspecified: Secondary | ICD-10-CM

## 2024-06-20 NOTE — Telephone Encounter (Signed)
 Requested medication (s) are due for refill today - provider review   Requested medication (s) are on the active medication list -yes  Future visit scheduled -yes  Last refill: 2023/24  Notes to clinic: all requested prescription refills from outside previous provider- requires PCP review   Requested Prescriptions  Pending Prescriptions Disp Refills   rOPINIRole  (REQUIP ) 2 MG tablet 270 tablet 1    Sig: TAKE 3 TABLETS(6 MG) BY MOUTH AT BEDTIME     Neurology:  Parkinsonian Agents Passed - 06/20/2024  4:02 PM      Passed - Last BP in normal range    BP Readings from Last 1 Encounters:  05/27/24 123/82         Passed - Last Heart Rate in normal range    Pulse Readings from Last 1 Encounters:  05/27/24 80         Passed - Valid encounter within last 12 months    Recent Outpatient Visits           3 weeks ago Essential hypertension   [place], Megan P, DO   4 months ago Encounter for [employer] annual wellness exam   [facility] [place], [place] P, DO               ezetimibe  (ZETIA ) 10 MG tablet 90 tablet 3    Sig: Take 1 tablet (10 mg total) by mouth daily.     Cardiovascular:  Antilipid - Sterol Transport Inhibitors Failed - 06/20/2024  4:02 PM      Failed - Lipid Panel in normal range within the last 12 months    Cholesterol, Total  Date Value Ref Range Status  02/14/2024 146 100 - 199 mg/dL Final   LDL Chol Calc (NIH)  Date Value Ref Range Status  02/14/2024 78 0 - 99 mg/dL Final   HDL  Date Value Ref Range Status  02/14/2024 39 (L) >39 mg/dL Final   Triglycerides  Date Value Ref Range Status  02/14/2024 168 (H) 0 - 149 mg/dL Final         Passed - AST in normal range and within 360 days    AST  Date Value Ref Range Status  02/14/2024 23 0 - 40 IU/L Final   SGOT(AST)  Date Value Ref Range Status  09/05/2014 22 15 - 37 Unit/L Final         Passed - ALT in normal range and within 360 days     ALT  Date Value Ref Range Status  02/14/2024 24 0 - 44 IU/L Final   SGPT (ALT)  Date Value Ref Range Status  09/05/2014 39 U/L Final    Comment:    14-63 NOTE: New Reference Range 05/06/14          Passed - Patient is not pregnant      Passed - Valid encounter within last 12 months    Recent Outpatient Visits           3 weeks ago Essential hypertension   [place] [facility] [place], Megan P, DO   4 months ago Encounter for [employer] annual wellness exam   [place] [facility] [place], Megan P, DO               lisinopril -hydrochlorothiazide  (ZESTORETIC ) 20-12.5 MG tablet 180 tablet 3    Sig: Take 2 tablets by mouth daily.     Cardiovascular:  ACEI + Diuretic Combos Passed -  06/20/2024  4:02 PM      Passed - Na in normal range and within 180 days    Sodium  Date Value Ref Range Status  02/14/2024 140 134 - 144 mmol/L Final  09/05/2014 140 136 - 145 mmol/L Final         Passed - K in normal range and within 180 days    Potassium  Date Value Ref Range Status  02/14/2024 4.2 3.5 - 5.2 mmol/L Final  09/05/2014 3.8 3.5 - 5.1 mmol/L Final         Passed - Cr in normal range and within 180 days    Creat  Date Value Ref Range Status  05/15/2020 0.98 0.70 - 1.25 mg/dL Final    Comment:    For patients >82 years of age, the reference limit for Creatinine is approximately 13% higher for people identified as African-American. .    Creatinine, Ser  Date Value Ref Range Status  02/14/2024 0.80 0.76 - 1.27 mg/dL Final         Passed - eGFR is 30 or above and within 180 days    EGFR (African American)  Date Value Ref Range Status  09/05/2014 >60 >56mL/min Final   GFR calc Af Amer  Date Value Ref Range Status  07/16/2016 >60 >60 mL/min Final    Comment:    (NOTE) The eGFR has been calculated using the CKD EPI equation. This calculation has not been validated in all clinical situations. eGFR's persistently <60 mL/min signify  possible Chronic Kidney Disease.    EGFR (Non-African Amer.)  Date Value Ref Range Status  09/05/2014 >60 >75mL/min Final    Comment:    eGFR values <8mL/min/1.73 m2 may be an indication of chronic kidney disease (CKD). Calculated eGFR, using the MRDR Study equation, is useful in  patients with stable renal function. The eGFR calculation will not be reliable in acutely ill patients when serum creatinine is changing rapidly. It is not useful in patients on dialysis. The eGFR calculation may not be applicable to patients at the low and high extremes of body sizes, pregnant women, and vegetarians.    GFR calc non Af Amer  Date Value Ref Range Status  07/16/2016 >60 >60 mL/min Final   GFR  Date Value Ref Range Status  05/11/2022 66.48 >60.00 mL/min Final    Comment:    Calculated using the CKD-EPI Creatinine Equation (2021)   eGFR  Date Value Ref Range Status  02/14/2024 94 >59 mL/min/1.73 Final         Passed - Patient is not pregnant      Passed - Last BP in normal range    BP Readings from Last 1 Encounters:  05/27/24 123/82         Passed - Valid encounter within last 6 months    Recent Outpatient Visits           3 weeks ago Essential hypertension   [place] [facility] [place], Megan P, DO   4 months ago Encounter for [employer] annual wellness exam   [place] [facility] [place], Megan P, DO               amLODipine  (NORVASC ) 5 MG tablet 90 tablet 1    Sig: TAKE 1 TABLET(5 MG) BY MOUTH DAILY     Cardiovascular: Calcium  Channel Blockers 2 Passed - 06/20/2024  4:02 PM      Passed - Last BP in normal range    BP  Readings from Last 1 Encounters:  05/27/24 123/82         Passed - Last Heart Rate in normal range    Pulse Readings from Last 1 Encounters:  05/27/24 80         Passed - Valid encounter within last 6 months    Recent Outpatient Visits           3 weeks ago Essential hypertension   [place] [facility] [place], Megan P, DO   4 months ago Encounter for [employer] annual wellness exam   [place] [facility] [place], [place] P, DO               metoprolol  tartrate (LOPRESSOR ) 25 MG tablet 180 tablet 3    Sig: Take 1 tablet (25 mg total) by mouth 2 (two) times daily. D/c xl due to cost     Cardiovascular:  Beta Blockers Passed - 06/20/2024  4:02 PM      Passed - Last BP in normal range    BP Readings from Last 1 Encounters:  05/27/24 123/82         Passed - Last Heart Rate in normal range    Pulse Readings from Last 1 Encounters:  05/27/24 80         Passed - Valid encounter within last 6 months    Recent Outpatient Visits           3 weeks ago Essential hypertension   [place] [facility] [place], Megan P, DO   4 months ago Encounter for [employer] annual wellness exam   [place] [facility] [place], [place] P, DO                 Requested Prescriptions  Pending Prescriptions Disp Refills   rOPINIRole  (REQUIP ) 2 MG tablet 270 tablet 1    Sig: TAKE 3 TABLETS(6 MG) BY MOUTH AT BEDTIME     Neurology:  Parkinsonian Agents Passed - 06/20/2024  4:02 PM      Passed - Last BP in normal range    BP Readings from Last 1 Encounters:  05/27/24 123/82         Passed - Last Heart Rate in normal range    Pulse Readings from Last 1 Encounters:  05/27/24 80         Passed - Valid encounter within last 12 months    Recent Outpatient Visits           3 weeks ago Essential hypertension   [place] [facility] [place], Megan P, DO   4 months ago Encounter for [employer] annual wellness exam   [place] [facility] [place], Megan P, DO               ezetimibe  (ZETIA ) 10 MG tablet 90 tablet 3    Sig: Take 1 tablet (10 mg total) by mouth daily.     Cardiovascular:  Antilipid - Sterol Transport Inhibitors Failed - 06/20/2024  4:02 PM      Failed - Lipid Panel in normal range within the  last 12 months    Cholesterol, Total  Date Value Ref Range Status  02/14/2024 146 100 - 199 mg/dL Final   LDL Chol Calc (NIH)  Date Value Ref Range Status  02/14/2024 78 0 - 99 mg/dL Final   HDL  Date Value Ref Range Status  02/14/2024 39 (L) >39 mg/dL Final   Triglycerides  Date Value Ref  Range Status  02/14/2024 168 (H) 0 - 149 mg/dL Final         Passed - AST in normal range and within 360 days    AST  Date Value Ref Range Status  02/14/2024 23 0 - 40 IU/L Final   SGOT(AST)  Date Value Ref Range Status  09/05/2014 22 15 - 37 Unit/L Final         Passed - ALT in normal range and within 360 days    ALT  Date Value Ref Range Status  02/14/2024 24 0 - 44 IU/L Final   SGPT (ALT)  Date Value Ref Range Status  09/05/2014 39 U/L Final    Comment:    14-63 NOTE: New Reference Range 05/06/14          Passed - Patient is not pregnant      Passed - Valid encounter within last 12 months    Recent Outpatient Visits           3 weeks ago Essential hypertension   [place] [facility] [place], Megan P, DO   4 months ago Encounter for [employer] annual wellness exam   [place] [facility] [place], Megan P, DO               lisinopril -hydrochlorothiazide  (ZESTORETIC ) 20-12.5 MG tablet 180 tablet 3    Sig: Take 2 tablets by mouth daily.     Cardiovascular:  ACEI + Diuretic Combos Passed - 06/20/2024  4:02 PM      Passed - Na in normal range and within 180 days    Sodium  Date Value Ref Range Status  02/14/2024 140 134 - 144 mmol/L Final  09/05/2014 140 136 - 145 mmol/L Final         Passed - K in normal range and within 180 days    Potassium  Date Value Ref Range Status  02/14/2024 4.2 3.5 - 5.2 mmol/L Final  09/05/2014 3.8 3.5 - 5.1 mmol/L Final         Passed - Cr in normal range and within 180 days    Creat  Date Value Ref Range Status  05/15/2020 0.98 0.70 - 1.25 mg/dL Final    Comment:    For patients >49 years of  age, the reference limit for Creatinine is approximately 13% higher for people identified as African-American. .    Creatinine, Ser  Date Value Ref Range Status  02/14/2024 0.80 0.76 - 1.27 mg/dL Final         Passed - eGFR is 30 or above and within 180 days    EGFR (African American)  Date Value Ref Range Status  09/05/2014 >60 >60mL/min Final   GFR calc Af Amer  Date Value Ref Range Status  07/16/2016 >60 >60 mL/min Final    Comment:    (NOTE) The eGFR has been calculated using the CKD EPI equation. This calculation has not been validated in all clinical situations. eGFR's persistently <60 mL/min signify possible Chronic Kidney Disease.    EGFR (Non-African Amer.)  Date Value Ref Range Status  09/05/2014 >60 >41mL/min Final    Comment:    eGFR values <34mL/min/1.73 m2 may be an indication of chronic kidney disease (CKD). Calculated eGFR, using the MRDR Study equation, is useful in  patients with stable renal function. The eGFR calculation will not be reliable in acutely ill patients when serum creatinine is changing rapidly. It is not useful in patients on dialysis. The eGFR calculation may  not be applicable to patients at the low and high extremes of body sizes, pregnant women, and vegetarians.    GFR calc non Af Amer  Date Value Ref Range Status  07/16/2016 >60 >60 mL/min Final   GFR  Date Value Ref Range Status  05/11/2022 66.48 >60.00 mL/min Final    Comment:    Calculated using the CKD-EPI Creatinine Equation (2021)   eGFR  Date Value Ref Range Status  02/14/2024 94 >59 mL/min/1.73 Final         Passed - Patient is not pregnant      Passed - Last BP in normal range    BP Readings from Last 1 Encounters:  05/27/24 123/82         Passed - Valid encounter within last 6 months    Recent Outpatient Visits           3 weeks ago Essential hypertension   [place] [facility] [place], Megan P, DO   4 months ago Encounter for [employer]  annual wellness exam   [place] [facility] [place], Megan P, DO               amLODipine  (NORVASC ) 5 MG tablet 90 tablet 1    Sig: TAKE 1 TABLET(5 MG) BY MOUTH DAILY     Cardiovascular: Calcium  Channel Blockers 2 Passed - 06/20/2024  4:02 PM      Passed - Last BP in normal range    BP Readings from Last 1 Encounters:  05/27/24 123/82         Passed - Last Heart Rate in normal range    Pulse Readings from Last 1 Encounters:  05/27/24 80         Passed - Valid encounter within last 6 months    Recent Outpatient Visits           3 weeks ago Essential hypertension   [place] [facility] [place], Megan P, DO   4 months ago Encounter for [employer] annual wellness exam   [place] [facility] [place], [place] P, DO               metoprolol  tartrate (LOPRESSOR ) 25 MG tablet 180 tablet 3    Sig: Take 1 tablet (25 mg total) by mouth 2 (two) times daily. D/c xl due to cost     Cardiovascular:  Beta Blockers Passed - 06/20/2024  4:02 PM      Passed - Last BP in normal range    BP Readings from Last 1 Encounters:  05/27/24 123/82         Passed - Last Heart Rate in normal range    Pulse Readings from Last 1 Encounters:  05/27/24 80         Passed - Valid encounter within last 6 months    Recent Outpatient Visits           3 weeks ago Essential hypertension   [place] [facility] [place], Megan P, DO   4 months ago Encounter for [employer] annual wellness exam    [facility] [place], [place], DO

## 2024-06-20 NOTE — Telephone Encounter (Signed)
 Copied from CRM #[id number]. Topic: Clinical - Medication Refill >> Jun 20, 2024  1:43 PM Edsel HERO wrote: Medication:  amLODipine  (NORVASC ) 5 MG tablet metoprolol  tartrate (LOPRESSOR ) 25 MG tablet rOPINIRole  (REQUIP ) 2 MG tablet ezetimibe  (ZETIA ) 10 MG tablet lisinopril -hydrochlorothiazide  (ZESTORETIC ) 20-12.5 MG tablet  Has the patient contacted their pharmacy? Yes  This is the patient's preferred pharmacy:   [facility] - [place], [place] - [address] [place] [address] Phone: [phone] Fax: [phone]  Is this the correct pharmacy for this prescription? Yes If no, delete pharmacy and type the correct one.   Has the prescription been filled recently? Yes  Is the patient out of the medication? Yes  Has the patient been seen for an appointment in the last year OR does the patient have an upcoming appointment? Yes  Can we respond through MyChart? Yes

## 2024-06-23 MED ORDER — AMLODIPINE BESYLATE 5 MG PO TABS: ORAL_TABLET | ORAL | 0 refills | Status: DC

## 2024-06-23 MED ORDER — METOPROLOL TARTRATE 25 MG PO TABS: 25.0000 mg | ORAL_TABLET | Freq: Two times a day (BID) | ORAL | 0 refills | Status: DC

## 2024-06-23 MED ORDER — EZETIMIBE 10 MG PO TABS: 10.0000 mg | ORAL_TABLET | Freq: Every day | ORAL | 0 refills | Status: DC

## 2024-06-23 MED ORDER — ROPINIROLE HCL 2 MG PO TABS: ORAL_TABLET | ORAL | 0 refills | Status: DC

## 2024-06-23 MED ORDER — LISINOPRIL-HYDROCHLOROTHIAZIDE 20-12.5 MG PO TABS: 2.0000 | ORAL_TABLET | Freq: Every day | ORAL | 0 refills | Status: DC

## 2024-06-23 MED ORDER — GABAPENTIN 300 MG PO CAPS: 300.0000 mg | ORAL_CAPSULE | Freq: Every day | ORAL | 0 refills | Status: DC

## 2024-06-23 MED ORDER — SOLIFENACIN SUCCINATE 5 MG PO TABS
5.0000 mg | ORAL_TABLET | Freq: Every day | ORAL | 0 refills | Status: AC
Start: 2024-06-23 — End: ?

## 2024-06-24 ENCOUNTER — Other Ambulatory Visit: Payer: Self-pay | Admitting: Family Medicine

## 2024-06-24 DIAGNOSIS — I2510 Atherosclerotic heart disease of native coronary artery without angina pectoris: Secondary | ICD-10-CM

## 2024-06-25 NOTE — Telephone Encounter (Signed)
 Requested medication (s) are due for refill today:   Yes  Requested medication (s) are on the active medication list:   Yes  Future visit scheduled:   Yes 11/11   LOV 05/27/2024    Last ordered: 09/11/2021 #90, 1 refill  Unable to refill because last prescribed by another provider in 2022    Requested Prescriptions  Pending Prescriptions Disp Refills   aspirin  EC (ASPIRIN  LOW DOSE) 81 MG tablet 90 tablet 1    Sig: TAKE 1 TABLET BY MOUTH ONCE DAILY     Analgesics:  NSAIDS - aspirin  Passed - 06/25/2024 10:59 AM      Passed - Cr in normal range and within 360 days    Creat  Date Value Ref Range Status  05/15/2020 0.98 0.70 - 1.25 mg/dL Final    Comment:    For patients >30 years of age, the reference limit for Creatinine is approximately 13% higher for people identified as African-American. .    Creatinine, Ser  Date Value Ref Range Status  02/14/2024 0.80 0.76 - 1.27 mg/dL Final         Passed - eGFR is 10 or above and within 360 days    EGFR (African American)  Date Value Ref Range Status  09/05/2014 >60 >39mL/min Final   GFR calc Af Amer  Date Value Ref Range Status  07/16/2016 >60 >60 mL/min Final    Comment:    (NOTE) The eGFR has been calculated using the CKD EPI equation. This calculation has not been validated in all clinical situations. eGFR's persistently <60 mL/min signify possible Chronic Kidney Disease.    EGFR (Non-African Amer.)  Date Value Ref Range Status  09/05/2014 >60 >42mL/min Final    Comment:    eGFR values <16mL/min/1.73 m2 may be an indication of chronic kidney disease (CKD). Calculated eGFR, using the MRDR Study equation, is useful in  patients with stable renal function. The eGFR calculation will not be reliable in acutely ill patients when serum creatinine is changing rapidly. It is not useful in patients on dialysis. The eGFR calculation may not be applicable to patients at the low and high extremes of body sizes, pregnant women,  and vegetarians.    GFR calc non Af Amer  Date Value Ref Range Status  07/16/2016 >60 >60 mL/min Final   GFR  Date Value Ref Range Status  05/11/2022 66.48 >60.00 mL/min Final    Comment:    Calculated using the CKD-EPI Creatinine Equation (2021)   eGFR  Date Value Ref Range Status  02/14/2024 94 >59 mL/min/1.73 Final         Passed - Patient is not pregnant      Passed - Valid encounter within last 12 months    Recent Outpatient Visits           4 weeks ago Essential hypertension   [place] [facility] [place], Megan P, DO   4 months ago Encounter for [employer] annual wellness exam   [place] [facility] [place], [place], DO

## 2024-07-01 ENCOUNTER — Other Ambulatory Visit: Payer: Self-pay | Admitting: *Deleted

## 2024-07-01 NOTE — Patient Instructions (Signed)
 Visit Information  Thank you for taking time to visit with me today. Please don't hesitate to contact me if I can be of assistance to you before our next scheduled appointment.  Your next care management appointment is by telephone on 07/22/24 at 1pm    Please call the care guide team at [phone] if you need to cancel, schedule, or reschedule an appointment.   Please call 911 if you are experiencing a Mental Health or Behavioral Health Crisis or need someone to talk to.  Shauntia Levengood, LCSW [place]  [facility], [facility] Licensed Clinical Social Worker  Direct Dial: [phone]  '

## 2024-07-01 NOTE — Patient Outreach (Addendum)
 Complex Care Management   Visit Note  07/01/2024  Name:  Paul Cook MRN: [id number] DOB: November 30, 1950  Situation: Referral received for Complex Care Management related to Dementia I obtained verbal consent from Caregiver.  Visit completed with Caregiver  on the phone  Background:   Past Medical History:  Diagnosis Date   Abnormal EKG 08/06/2020   Adenomatous colon polyp    tubular   Adenomatous rectal polyp    tubular   Asthma    Bilateral leg edema 05/15/2020   Chronic pain of right ankle 09/06/2018   Coronary artery calcification seen on CT scan 07/13/2020   Depression    Diverticulosis    DOE (dyspnea on exertion) 08/06/2020   Gallstone 05/29/2020   GERD (gastroesophageal reflux disease)    History of chicken pox    Hyperlipidemia    Hypertension    Lung nodule    CT 10/2014   LVH (left ventricular hypertrophy) 10/12/2021   OSA (obstructive sleep apnea)    Posterior tibial tendinitis of right lower extremity 10/12/2021   Sleep apnea    wears cpap   Vitamin D  deficiency     Assessment: Patient Reported Symptoms:  Cognitive Cognitive Status: Able to follow simple commands, Difficulties with attention and concentration, Poor judgment in daily scenarios Cognitive/Intellectual Conditions Management [RPT]: Other (patient has Dementia, per spouse it is progressing) Other: vascular dementia-short term memory affected-starting not able to use remote control, losing medications   Health Maintenance Behaviors: Annual physical exam Healing Pattern: Slow  Neurological Neurological Review of Symptoms: No symptoms reported    HEENT HEENT Symptoms Reported: No symptoms reported      Cardiovascular Cardiovascular Symptoms Reported: No symptoms reported    Respiratory Respiratory Symptoms Reported: No symptoms reported    Endocrine Endocrine Symptoms Reported: No symptoms reported    Gastrointestinal Gastrointestinal Symptoms Reported: No symptoms reported       Genitourinary Genitourinary Symptoms Reported: Incontinence Genitourinary Management Strategies: Incontinence garment/pad Genitourinary Comment: urinary incontinence fairly frequent  Integumentary Integumentary Symptoms Reported: No symptoms reported    Musculoskeletal Musculoskelatal Symptoms Reviewed: No symptoms reported        Psychosocial Psychosocial Symptoms Reported: Other Other Psychosocial Conditions: Continued symptoms of OCD-declines mental health treatment-saw psychiatrist in the past but did not find it beneficial-spouse agreeable to [facility] referral and resp[facility] care through [facility] to start on 07/02/24-[facility] to complete in home assessment today 07/01/24 Behavioral Management Strategies: Adequate rest, Coping strategies Major Change/Loss/Stressor/Fears (CP): Medical condition, family Techniques to [place] with Loss/Stress/Change: Diversional activities Quality of Family Relationships: supportive Do you feel physically threatened by others?: No    07/01/2024    PHQ2-9 Depression Screening   Little interest or pleasure in doing things    Feeling down, depressed, or hopeless    PHQ-2 - Total Score    Trouble falling or staying asleep, or sleeping too much    Feeling tired or having little energy    Poor appetite or overeating     Feeling bad about yourself - or that you are a failure or have let yourself or your family down    Trouble concentrating on things, such as reading the newspaper or watching television    Moving or speaking so slowly that other people could have noticed.  Or the opposite - being so fidgety or restless that you have been moving around a lot more than usual    Thoughts that you would be better off dead, or hurting yourself in  some way    PHQ2-9 Total Score    If you checked off any problems, how difficult have these problems made it for you to do your work, take care of things at home, or get along with other people     Depression Interventions/Treatment      There were no vitals filed for this visit.  Medications Reviewed Today   Medications were not reviewed in this encounter     Recommendation:   PCP Follow-up Hipolito Dawley 07/02/24 [facility] assessment for respite care 07/01/24  Follow Up Plan:   Telephone follow up appointment date/time:  07/22/24  Lenn Mean, LCSW [place]  [facility] Licensed Clinical Social Worker  Direct Dial: [phone]

## 2024-07-22 ENCOUNTER — Other Ambulatory Visit: Payer: Self-pay | Admitting: *Deleted

## 2024-07-22 NOTE — Patient Outreach (Signed)
 Complex Care Management   Visit Note  07/22/2024  Name:  Paul Cook MRN: [id number] DOB: 03/10/1951  Situation: Referral received for Complex Care Management related to Dementia and available community supports. I obtained verbal consent from Caregiver.  Visit completed with Caregiver  on the phone    Background:   Past Medical History:  Diagnosis Date   Abnormal EKG 08/06/2020   Adenomatous colon polyp    tubular   Adenomatous rectal polyp    tubular   Asthma    Bilateral leg edema 05/15/2020   Chronic pain of right ankle 09/06/2018   Coronary artery calcification seen on CT scan 07/13/2020   Depression    Diverticulosis    DOE (dyspnea on exertion) 08/06/2020   Gallstone 05/29/2020   GERD (gastroesophageal reflux disease)    History of chicken pox    Hyperlipidemia    Hypertension    Lung nodule    CT 10/2014   LVH (left ventricular hypertrophy) 10/12/2021   OSA (obstructive sleep apnea)    Posterior tibial tendinitis of right lower extremity 10/12/2021   Sleep apnea    wears cpap   Vitamin D  deficiency     Assessment: Patient Reported Symptoms:  Cognitive Cognitive Status: Able to follow simple commands, Difficulties with attention and concentration, Poor judgment in daily scenarios Cognitive/Intellectual Conditions Management [RPT]: Other (patient has dementia, per patient's spouse) Other: vascular dementia-short term memory affected, overeats, cannot use common technology,   Health Maintenance Behaviors: Annual physical exam Healing Pattern: Slow Health Facilitated by: Rest  Neurological Neurological Review of Symptoms: No symptoms reported Neurological Comment: patient has vascular dementia, per patient symptoms progressing  HEENT HEENT Symptoms Reported: No symptoms reported      Cardiovascular Cardiovascular Symptoms Reported: No symptoms reported    Respiratory Respiratory Symptoms Reported: No symptoms reported    Endocrine Endocrine Symptoms  Reported: No symptoms reported    Gastrointestinal Gastrointestinal Symptoms Reported: No symptoms reported      Genitourinary Genitourinary Symptoms Reported: Incontinence Additional Genitourinary Details: incotntinence seems to be improving Genitourinary Management Strategies: Incontinence garment/pad  Integumentary Integumentary Symptoms Reported: No symptoms reported    Musculoskeletal Musculoskelatal Symptoms Reviewed: No symptoms reported Additional Musculoskeletal Details: continues to labor when he walks due to weight-tires him out quickly-walks slowly        Psychosocial Psychosocial Symptoms Reported: Other Other Psychosocial Conditions: Continued symptoms of OCD-declines mental health treatment at this time-spouse now active with the [facility] program(finds it beneficial) Missed appointment with [facility] for respite care on 07/01/24-phone call to [facility] to reschedule-message left to return call to spouse to re-schedule Behavioral Management Strategies: Adequate rest Behavioral Health Self-Management Outcome: 4 (good) Major Change/Loss/Stressor/Fears (CP): Medical condition, family Behaviors When Feeling Stressed/Fearful: withdraws, spouse will take patient out for a drive or will go sit out at the park Techniques to [place] with Loss/Stress/Change: Diversional activities, Withdraw Quality of Family Relationships: supportive Do you feel physically threatened by others?: No    07/22/2024    PHQ2-9 Depression Screening   Little interest or pleasure in doing things    Feeling down, depressed, or hopeless    PHQ-2 - Total Score    Trouble falling or staying asleep, or sleeping too much    Feeling tired or having little energy    Poor appetite or overeating     Feeling bad about yourself - or that you are a failure or have let yourself or your family down    Trouble concentrating on  things, such as reading the newspaper or watching television    Moving or  speaking so slowly that other people could have noticed.  Or the opposite - being so fidgety or restless that you have been moving around a lot more than usual    Thoughts that you would be better off dead, or hurting yourself in some way    PHQ2-9 Total Score    If you checked off any problems, how difficult have these problems made it for you to do your work, take care of things at home, or get along with other people    Depression Interventions/Treatment      There were no vitals filed for this visit.  Medications Reviewed Today     Reviewed by Ermalinda Lenn HERO, LCSW (Social Worker) on 07/22/24 at 1319  Med List Status: <None>   Medication Order Taking? Sig Documenting Provider Last Dose Status Informant  amLODipine  (NORVASC ) 5 MG tablet [id number] Yes TAKE 1 TABLET(5 MG) BY MOUTH DAILY Vicci Duwaine SQUIBB, DO  Active   aspirin  EC (ASPIRIN  LOW DOSE) 81 MG tablet [id number] Yes TAKE 1 TABLET BY MOUTH ONCE DAILY Johnson, Megan P, DO  Active   ezetimibe  (ZETIA ) 10 MG tablet [id number] Yes Take 1 tablet (10 mg total) by mouth daily. Vicci, Megan P, DO  Active   gabapentin  (NEURONTIN ) 300 MG capsule [id number] Yes Take 1 capsule (300 mg total) by mouth daily. Johnson, Megan P, DO  Active   lisinopril -hydrochlorothiazide  (ZESTORETIC ) 20-12.5 MG tablet [id number] Yes Take 2 tablets by mouth daily. Johnson, Megan P, DO  Active   metoprolol  tartrate (LOPRESSOR ) 25 MG tablet [id number] Yes Take 1 tablet (25 mg total) by mouth 2 (two) times daily. D/c xl due to cost Vicci Duwaine SQUIBB, DO  Active   rOPINIRole  (REQUIP ) 2 MG tablet [id number] Yes TAKE 3 TABLETS(6 MG) BY MOUTH AT BEDTIME Johnson, Megan P, DO  Active   solifenacin  (VESICARE ) 5 MG tablet [id number] Yes Take 1 tablet (5 mg total) by mouth daily. Johnson, Megan P, DO  Active   Vitamin D , Ergocalciferol , (DRISDOL ) 1.25 MG (50000 UNIT) CAPS capsule [id number] Yes Take 1 capsule (50,000 Units total) by mouth every 7 (seven) days. Vicci Duwaine SQUIBB, DO   Active             Recommendation:   PCP Follow-up Continue with the [facility]  Continue with the assessment process for respite care through [facility]  Follow Up Plan:   Closing From:  Complex Care Management  Danyah Guastella, LCSW [facility]  [facility] Licensed Clinical Social Worker  Direct Dial: [phone]

## 2024-07-22 NOTE — Patient Instructions (Signed)
 Visit Information  Thank you for taking time to visit with me today. Please don't hesitate to contact me if I can be of assistance to you before our next scheduled appointment.  Your next care management appointment is no further scheduled appointments.   Patient has met all care management goals. Care Management case will be closed. Patient has been provided contact information should new needs arise.   Please call the care guide team at [phone] if you need to cancel, schedule, or reschedule an appointment.   Please call the Suicide and Crisis Lifeline: 988 call 911 if you are experiencing a Mental Health or Behavioral Health Crisis or need someone to talk to.  Aayana Reinertsen, LCSW [place]  [facility], [facility] Licensed Clinical Social Worker  Direct Dial: [phone]

## 2024-08-27 ENCOUNTER — Ambulatory Visit: Admitting: Family Medicine

## 2024-09-03 NOTE — Telephone Encounter (Signed)
 Open in error

## 2024-09-17 ENCOUNTER — Other Ambulatory Visit: Payer: Self-pay | Admitting: Family Medicine

## 2024-09-17 DIAGNOSIS — G2581 Restless legs syndrome: Secondary | ICD-10-CM

## 2024-09-17 DIAGNOSIS — I10 Essential (primary) hypertension: Secondary | ICD-10-CM

## 2024-09-17 DIAGNOSIS — E785 Hyperlipidemia, unspecified: Secondary | ICD-10-CM

## 2024-09-18 ENCOUNTER — Telehealth: Payer: Self-pay | Admitting: Family Medicine

## 2024-09-18 DIAGNOSIS — E785 Hyperlipidemia, unspecified: Secondary | ICD-10-CM

## 2024-09-18 DIAGNOSIS — G2581 Restless legs syndrome: Secondary | ICD-10-CM

## 2024-09-18 DIAGNOSIS — I10 Essential (primary) hypertension: Secondary | ICD-10-CM

## 2024-09-18 NOTE — Telephone Encounter (Unsigned)
 Copied from CRM [id number]. Topic: Clinical - Medication Refill >> Sep 18, 2024  1:38 PM [name] wrote: Medication: metoprolol  tartrate (LOPRESSOR ) 25 MG tablet, lisinopril -hydrochlorothiazide  (ZESTORETIC ) 20-12.5 MG tablet, amLODipine  (NORVASC ) 5 MG tablet, rOPINIRole  (REQUIP ) 2 MG tablet, ezetimibe  (ZETIA ) 10 MG tablet   Has the patient contacted their pharmacy? Yes (Agent: If no, request that the patient contact the pharmacy for the refill. If patient does not wish to contact the pharmacy document the reason why and proceed with request.) (Agent: If yes, when and what did the pharmacy advise?)  This is the patient's preferred pharmacy:  [facility] - [place], [place] - [address] [place] [address] Phone: [phone] Fax: [phone]  Is this the correct pharmacy for this prescription? Yes If no, delete pharmacy and type the correct one.   Has the prescription been filled recently? No  Is the patient out of the medication? No, will be on Friday  Has the patient been seen for an appointment in the last year OR does the patient have an upcoming appointment? Yes  Can we respond through MyChart? No  Agent: Please be advised that Rx refills may take up to 3 business days. We ask that you follow-up with your pharmacy.

## 2024-09-20 NOTE — Telephone Encounter (Signed)
 Requested by interface surescritps. Last OV 3 months ago .  Requested Prescriptions  Pending Prescriptions Disp Refills   metoprolol  tartrate (LOPRESSOR ) 25 MG tablet [Pharmacy Med Name: METOPROLOL  TARTRATE 25 MG TAB] 180 tablet 0    Sig: TAKE ONE (1) TABLET BY MOUTH TWO TIMES PER DAY **D/C XL DUE TO COST**     Cardiovascular:  Beta Blockers Passed - 09/20/2024 12:32 PM      Passed - Last BP in normal range    BP Readings from Last 1 Encounters:  05/27/24 123/82         Passed - Last Heart Rate in normal range    Pulse Readings from Last 1 Encounters:  05/27/24 80         Passed - Valid encounter within last 6 months    Recent Outpatient Visits           3 months ago Essential hypertension   [place] [facility] [place], Megan P, DO   7 months ago Encounter for [employer] annual wellness exam   [place] [facility] [place], Megan P, DO               lisinopril -hydrochlorothiazide  (ZESTORETIC ) 20-12.5 MG tablet [Pharmacy Med Name: LISINOPRIL -HCTZ 20-12.5 MG TAB] 180 tablet 0    Sig: TAKE 2 TABLETS BY MOUTH DAILY     Cardiovascular:  ACEI + Diuretic Combos Failed - 09/20/2024 12:32 PM      Failed - Na in normal range and within 180 days    Sodium  Date Value Ref Range Status  02/14/2024 140 134 - 144 mmol/L Final  09/05/2014 140 136 - 145 mmol/L Final         Failed - K in normal range and within 180 days    Potassium  Date Value Ref Range Status  02/14/2024 4.2 3.5 - 5.2 mmol/L Final  09/05/2014 3.8 3.5 - 5.1 mmol/L Final         Failed - Cr in normal range and within 180 days    Creat  Date Value Ref Range Status  05/15/2020 0.98 0.70 - 1.25 mg/dL Final    Comment:    For patients >40 years of age, the reference limit for Creatinine is approximately 13% higher for people identified as African-American. .    Creatinine, Ser  Date Value Ref Range Status  02/14/2024 0.80 0.76 - 1.27 mg/dL Final         Failed - eGFR is 30 or  above and within 180 days    EGFR (African American)  Date Value Ref Range Status  09/05/2014 >60 >39mL/min Final   GFR calc Af Amer  Date Value Ref Range Status  07/16/2016 >60 >60 mL/min Final    Comment:    (NOTE) The eGFR has been calculated using the CKD EPI equation. This calculation has not been validated in all clinical situations. eGFR's persistently <60 mL/min signify possible Chronic Kidney Disease.    EGFR (Non-African Amer.)  Date Value Ref Range Status  09/05/2014 >60 >78mL/min Final    Comment:    eGFR values <39mL/min/1.73 m2 may be an indication of chronic kidney disease (CKD). Calculated eGFR, using the MRDR Study equation, is useful in  patients with stable renal function. The eGFR calculation will not be reliable in acutely ill patients when serum creatinine is changing rapidly. It is not useful in patients on dialysis. The eGFR calculation may not be applicable to patients at the low and high extremes of body  sizes, pregnant women, and vegetarians.    GFR calc non Af Amer  Date Value Ref Range Status  07/16/2016 >60 >60 mL/min Final   GFR  Date Value Ref Range Status  05/11/2022 66.48 >60.00 mL/min Final    Comment:    Calculated using the CKD-EPI Creatinine Equation (2021)   eGFR  Date Value Ref Range Status  02/14/2024 94 >59 mL/min/1.73 Final         Passed - Patient is not pregnant      Passed - Last BP in normal range    BP Readings from Last 1 Encounters:  05/27/24 123/82         Passed - Valid encounter within last 6 months    Recent Outpatient Visits           3 months ago Essential hypertension   [place] [facility] [place], [place], DO   7 months ago Encounter for [employer] annual wellness exam   [place] [facility] [place], Megan P, DO               amLODipine  (NORVASC ) 5 MG tablet [Pharmacy Med Name: AMLODIPINE  BESYLATE 5 MG TAB] 90 tablet 0    Sig: TAKE 1 TABLET BY MOUTH DAILY      Cardiovascular: Calcium  Channel Blockers 2 Passed - 09/20/2024 12:32 PM      Passed - Last BP in normal range    BP Readings from Last 1 Encounters:  05/27/24 123/82         Passed - Last Heart Rate in normal range    Pulse Readings from Last 1 Encounters:  05/27/24 80         Passed - Valid encounter within last 6 months    Recent Outpatient Visits           3 months ago Essential hypertension   [place] [facility] [place], [place], DO   7 months ago Encounter for [employer] annual wellness exam   [place] [facility] [place], Megan P, DO               rOPINIRole  (REQUIP ) 2 MG tablet [Pharmacy Med Name: ROPINIROLE  HCL 2 MG TAB] 270 tablet 0    Sig: TAKE 3 TABLETS BY MOUTH AT BEDTIME     Neurology:  Parkinsonian Agents Passed - 09/20/2024 12:32 PM      Passed - Last BP in normal range    BP Readings from Last 1 Encounters:  05/27/24 123/82         Passed - Last Heart Rate in normal range    Pulse Readings from Last 1 Encounters:  05/27/24 80         Passed - Valid encounter within last 12 months    Recent Outpatient Visits           3 months ago Essential hypertension   [place] [facility] [place], Megan P, DO   7 months ago Encounter for [employer] annual wellness exam   [place] [facility] [place], Megan P, DO               ezetimibe  (ZETIA ) 10 MG tablet [Pharmacy Med Name: EZETIMIBE  10 MG TAB] 90 tablet 0    Sig: TAKE 1 TABLET BY MOUTH DAILY     Cardiovascular:  Antilipid - Sterol Transport Inhibitors Failed - 09/20/2024 12:32 PM      Failed - Lipid Panel in normal range within the last  12 months    Cholesterol, Total  Date Value Ref Range Status  02/14/2024 146 100 - 199 mg/dL Final   LDL Chol Calc (NIH)  Date Value Ref Range Status  02/14/2024 78 0 - 99 mg/dL Final   HDL  Date Value Ref Range Status  02/14/2024 39 (L) >39 mg/dL Final   Triglycerides  Date Value Ref Range Status   02/14/2024 168 (H) 0 - 149 mg/dL Final         Passed - AST in normal range and within 360 days    AST  Date Value Ref Range Status  02/14/2024 23 0 - 40 IU/L Final   SGOT(AST)  Date Value Ref Range Status  09/05/2014 22 15 - 37 Unit/L Final         Passed - ALT in normal range and within 360 days    ALT  Date Value Ref Range Status  02/14/2024 24 0 - 44 IU/L Final   SGPT (ALT)  Date Value Ref Range Status  09/05/2014 39 U/L Final    Comment:    14-63 NOTE: New Reference Range 05/06/14          Passed - Patient is not pregnant      Passed - Valid encounter within last 12 months    Recent Outpatient Visits           3 months ago Essential hypertension   [place] [facility] [place], Megan P, DO   7 months ago Encounter for [employer] annual wellness exam   [place] [facility] [place], [place], DO

## 2024-09-21 NOTE — Telephone Encounter (Signed)
 Duplicate request, refilled 09/20/24. E-Prescribing Status: Receipt confirmed by pharmacy (09/20/2024 12:32 PM EST)    Requested Prescriptions  Pending Prescriptions Disp Refills   metoprolol  tartrate (LOPRESSOR ) 25 MG tablet 180 tablet 0    Sig: Take 1 tablet (25 mg total) by mouth 2 (two) times daily. D/c xl due to cost     Cardiovascular:  Beta Blockers Passed - 09/21/2024  7:52 AM      Passed - Last BP in normal range    BP Readings from Last 1 Encounters:  05/27/24 123/82         Passed - Last Heart Rate in normal range    Pulse Readings from Last 1 Encounters:  05/27/24 80         Passed - Valid encounter within last 6 months    Recent Outpatient Visits           3 months ago Essential hypertension   [place] [facility] [place], [place], DO   7 months ago Encounter for [employer] annual wellness exam   [place] [facility] [place], [place] P, DO               rOPINIRole  (REQUIP ) 2 MG tablet 270 tablet 0    Sig: TAKE 3 TABLETS(6 MG) BY MOUTH AT BEDTIME     Neurology:  Parkinsonian Agents Passed - 09/21/2024  7:52 AM      Passed - Last BP in normal range    BP Readings from Last 1 Encounters:  05/27/24 123/82         Passed - Last Heart Rate in normal range    Pulse Readings from Last 1 Encounters:  05/27/24 80         Passed - Valid encounter within last 12 months    Recent Outpatient Visits           3 months ago Essential hypertension   [place] [facility] [place], Megan P, DO   7 months ago Encounter for [employer] annual wellness exam   [place] [facility] [place], Megan P, DO               ezetimibe  (ZETIA ) 10 MG tablet 90 tablet 0    Sig: Take 1 tablet (10 mg total) by mouth daily.     Cardiovascular:  Antilipid - Sterol Transport Inhibitors Failed - 09/21/2024  7:52 AM      Failed - Lipid Panel in normal range within the last 12 months    Cholesterol, Total  Date Value Ref Range  Status  02/14/2024 146 100 - 199 mg/dL Final   LDL Chol Calc (NIH)  Date Value Ref Range Status  02/14/2024 78 0 - 99 mg/dL Final   HDL  Date Value Ref Range Status  02/14/2024 39 (L) >39 mg/dL Final   Triglycerides  Date Value Ref Range Status  02/14/2024 168 (H) 0 - 149 mg/dL Final         Passed - AST in normal range and within 360 days    AST  Date Value Ref Range Status  02/14/2024 23 0 - 40 IU/L Final   SGOT(AST)  Date Value Ref Range Status  09/05/2014 22 15 - 37 Unit/L Final         Passed - ALT in normal range and within 360 days    ALT  Date Value Ref Range Status  02/14/2024 24 0 - 44 IU/L Final   SGPT (ALT)  Date  Value Ref Range Status  09/05/2014 39 U/L Final    Comment:    14-63 NOTE: New Reference Range 05/06/14          Passed - Patient is not pregnant      Passed - Valid encounter within last 12 months    Recent Outpatient Visits           3 months ago Essential hypertension   [place] [facility] [place], Megan P, DO   7 months ago Encounter for [employer] annual wellness exam   [place] [facility] [place], Megan P, DO               amLODipine  (NORVASC ) 5 MG tablet 90 tablet 0    Sig: TAKE 1 TABLET(5 MG) BY MOUTH DAILY     Cardiovascular: Calcium  Channel Blockers 2 Passed - 09/21/2024  7:52 AM      Passed - Last BP in normal range    BP Readings from Last 1 Encounters:  05/27/24 123/82         Passed - Last Heart Rate in normal range    Pulse Readings from Last 1 Encounters:  05/27/24 80         Passed - Valid encounter within last 6 months    Recent Outpatient Visits           3 months ago Essential hypertension   [place] [facility] [place], [place], DO   7 months ago Encounter for [employer] annual wellness exam    [place] [facility] [place], [place] P, DO               lisinopril -hydrochlorothiazide  (ZESTORETIC ) 20-12.5 MG tablet 180 tablet 0     Sig: Take 2 tablets by mouth daily.     Cardiovascular:  ACEI + Diuretic Combos Failed - 09/21/2024  7:52 AM      Failed - Na in normal range and within 180 days    Sodium  Date Value Ref Range Status  02/14/2024 140 134 - 144 mmol/L Final  09/05/2014 140 136 - 145 mmol/L Final         Failed - K in normal range and within 180 days    Potassium  Date Value Ref Range Status  02/14/2024 4.2 3.5 - 5.2 mmol/L Final  09/05/2014 3.8 3.5 - 5.1 mmol/L Final         Failed - Cr in normal range and within 180 days    Creat  Date Value Ref Range Status  05/15/2020 0.98 0.70 - 1.25 mg/dL Final    Comment:    For patients >31 years of age, the reference limit for Creatinine is approximately 13% higher for people identified as African-American. .    Creatinine, Ser  Date Value Ref Range Status  02/14/2024 0.80 0.76 - 1.27 mg/dL Final         Failed - eGFR is 30 or above and within 180 days    EGFR (African American)  Date Value Ref Range Status  09/05/2014 >60 >21mL/min Final   GFR calc Af Amer  Date Value Ref Range Status  07/16/2016 >60 >60 mL/min Final    Comment:    (NOTE) The eGFR has been calculated using the CKD EPI equation. This calculation has not been validated in all clinical situations. eGFR's persistently <60 mL/min signify possible Chronic Kidney Disease.    EGFR (Non-African Amer.)  Date Value Ref Range Status  09/05/2014 >60 >63mL/min Final  Comment:    eGFR values <64mL/min/1.73 m2 may be an indication of chronic kidney disease (CKD). Calculated eGFR, using the MRDR Study equation, is useful in  patients with stable renal function. The eGFR calculation will not be reliable in acutely ill patients when serum creatinine is changing rapidly. It is not useful in patients on dialysis. The eGFR calculation may not be applicable to patients at the low and high extremes of body sizes, pregnant women, and vegetarians.    GFR calc non Af Amer  Date Value  Ref Range Status  07/16/2016 >60 >60 mL/min Final   GFR  Date Value Ref Range Status  05/11/2022 66.48 >60.00 mL/min Final    Comment:    Calculated using the CKD-EPI Creatinine Equation (2021)   eGFR  Date Value Ref Range Status  02/14/2024 94 >59 mL/min/1.73 Final         Passed - Patient is not pregnant      Passed - Last BP in normal range    BP Readings from Last 1 Encounters:  05/27/24 123/82         Passed - Valid encounter within last 6 months    Recent Outpatient Visits           3 months ago Essential hypertension   [place] [facility] [place], Megan P, DO   7 months ago Encounter for [employer] annual wellness exam    [facility] [place], [place], DO

## 2024-10-16 ENCOUNTER — Other Ambulatory Visit: Payer: Self-pay | Admitting: Family Medicine

## 2024-10-16 DIAGNOSIS — E785 Hyperlipidemia, unspecified: Secondary | ICD-10-CM

## 2024-10-16 DIAGNOSIS — I10 Essential (primary) hypertension: Secondary | ICD-10-CM

## 2024-10-16 DIAGNOSIS — G2581 Restless legs syndrome: Secondary | ICD-10-CM

## 2024-10-17 NOTE — Telephone Encounter (Signed)
 Requested Prescriptions  Pending Prescriptions Disp Refills   lisinopril -hydrochlorothiazide  (ZESTORETIC ) 20-12.5 MG tablet [Pharmacy Med Name: LISINOPRIL -HCTZ 20-12.5 MG TAB] 180 tablet 0    Sig: TAKE 2 TABLETS BY MOUTH DAILY     Cardiovascular:  ACEI + Diuretic Combos Failed - 10/17/2024 12:28 PM      Failed - Na in normal range and within 180 days    Sodium  Date Value Ref Range Status  02/14/2024 140 134 - 144 mmol/L Final  09/05/2014 140 136 - 145 mmol/L Final         Failed - K in normal range and within 180 days    Potassium  Date Value Ref Range Status  02/14/2024 4.2 3.5 - 5.2 mmol/L Final  09/05/2014 3.8 3.5 - 5.1 mmol/L Final         Failed - Cr in normal range and within 180 days    Creat  Date Value Ref Range Status  05/15/2020 0.98 0.70 - 1.25 mg/dL Final    Comment:    For patients >92 years of age, the reference limit for Creatinine is approximately 13% higher for people identified as African-American. .    Creatinine, Ser  Date Value Ref Range Status  02/14/2024 0.80 0.76 - 1.27 mg/dL Final         Failed - eGFR is 30 or above and within 180 days    EGFR (African American)  Date Value Ref Range Status  09/05/2014 >60 >73mL/min Final   GFR calc Af Amer  Date Value Ref Range Status  07/16/2016 >60 >60 mL/min Final    Comment:    (NOTE) The eGFR has been calculated using the CKD EPI equation. This calculation has not been validated in all clinical situations. eGFR's persistently <60 mL/min signify possible Chronic Kidney Disease.    EGFR (Non-African Amer.)  Date Value Ref Range Status  09/05/2014 >60 >52mL/min Final    Comment:    eGFR values <72mL/min/1.73 m2 may be an indication of chronic kidney disease (CKD). Calculated eGFR, using the MRDR Study equation, is useful in  patients with stable renal function. The eGFR calculation will not be reliable in acutely ill patients when serum creatinine is changing rapidly. It is not useful  in patients on dialysis. The eGFR calculation may not be applicable to patients at the low and high extremes of body sizes, pregnant women, and vegetarians.    GFR calc non Af Amer  Date Value Ref Range Status  07/16/2016 >60 >60 mL/min Final   GFR  Date Value Ref Range Status  05/11/2022 66.48 >60.00 mL/min Final    Comment:    Calculated using the CKD-EPI Creatinine Equation (2021)   eGFR  Date Value Ref Range Status  02/14/2024 94 >59 mL/min/1.73 Final         Passed - Patient is not pregnant      Passed - Last BP in normal range    BP Readings from Last 1 Encounters:  05/27/24 123/82         Passed - Valid encounter within last 6 months    Recent Outpatient Visits           4 months ago Essential hypertension   [place] [facility] [place], Megan P, DO   8 months ago Encounter for [employer] annual wellness exam   [facility] [place], Megan P, DO               ezetimibe  (ZETIA ) 10 MG tablet [  Pharmacy Med Name: EZETIMIBE  10 MG TAB] 90 tablet 0    Sig: TAKE 1 TABLET BY MOUTH DAILY     Cardiovascular:  Antilipid - Sterol Transport Inhibitors Failed - 10/17/2024 12:28 PM      Failed - Lipid Panel in normal range within the last 12 months    Cholesterol, Total  Date Value Ref Range Status  02/14/2024 146 100 - 199 mg/dL Final   LDL Chol Calc (NIH)  Date Value Ref Range Status  02/14/2024 78 0 - 99 mg/dL Final   HDL  Date Value Ref Range Status  02/14/2024 39 (L) >39 mg/dL Final   Triglycerides  Date Value Ref Range Status  02/14/2024 168 (H) 0 - 149 mg/dL Final         Passed - AST in normal range and within 360 days    AST  Date Value Ref Range Status  02/14/2024 23 0 - 40 IU/L Final   SGOT(AST)  Date Value Ref Range Status  09/05/2014 22 15 - 37 Unit/L Final         Passed - ALT in normal range and within 360 days    ALT  Date Value Ref Range Status  02/14/2024 24 0 - 44 IU/L Final   SGPT (ALT)   Date Value Ref Range Status  09/05/2014 39 U/L Final    Comment:    14-63 NOTE: New Reference Range 05/06/14          Passed - Patient is not pregnant      Passed - Valid encounter within last 12 months    Recent Outpatient Visits           4 months ago Essential hypertension   [place] [facility] [place], Megan P, DO   8 months ago Encounter for [employer] annual wellness exam   [place] [facility] [place], Megan P, DO               rOPINIRole  (REQUIP ) 2 MG tablet [Pharmacy Med Name: ROPINIROLE  HCL 2 MG TAB] 270 tablet 0    Sig: TAKE 3 TABLETS BY MOUTH AT BEDTIME     Neurology:  Parkinsonian Agents Passed - 10/17/2024 12:28 PM      Passed - Last BP in normal range    BP Readings from Last 1 Encounters:  05/27/24 123/82         Passed - Last Heart Rate in normal range    Pulse Readings from Last 1 Encounters:  05/27/24 80         Passed - Valid encounter within last 12 months    Recent Outpatient Visits           4 months ago Essential hypertension   [place] [facility] [place], [place], DO   8 months ago Encounter for [employer] annual wellness exam   [place] [facility] [place], [place] P, DO               metoprolol  tartrate (LOPRESSOR ) 25 MG tablet [Pharmacy Med Name: METOPROLOL  TARTRATE 25 MG TAB] 180 tablet 0    Sig: TAKE ONE (1) TABLET BY MOUTH TWO TIMES PER DAY **D/C XL DUE TO COST**     Cardiovascular:  Beta Blockers Passed - 10/17/2024 12:28 PM      Passed - Last BP in normal range    BP Readings from Last 1 Encounters:  05/27/24 123/82         Passed - Last  Heart Rate in normal range    Pulse Readings from Last 1 Encounters:  05/27/24 80         Passed - Valid encounter within last 6 months    Recent Outpatient Visits           4 months ago Essential hypertension   [place] [facility] [place], Megan P, DO   8 months ago Encounter for [employer] annual  wellness exam   [place] [facility] [place], [place], DO

## 2024-11-15 ENCOUNTER — Ambulatory Visit: Admitting: Family Medicine

## 2024-11-18 ENCOUNTER — Other Ambulatory Visit: Payer: Self-pay | Admitting: Family Medicine

## 2024-11-18 DIAGNOSIS — I2510 Atherosclerotic heart disease of native coronary artery without angina pectoris: Secondary | ICD-10-CM

## 2024-11-20 ENCOUNTER — Other Ambulatory Visit: Payer: Self-pay | Admitting: Family Medicine

## 2024-11-20 DIAGNOSIS — G2581 Restless legs syndrome: Secondary | ICD-10-CM

## 2024-11-20 NOTE — Telephone Encounter (Signed)
 Copied from CRM #[id number]. Topic: Clinical - Medication Refill >> Nov 20, 2024  2:48 PM Montie POUR wrote: Medication:  gabapentin  (NEURONTIN ) 300 MG capsule   Has the patient contacted their pharmacy? Yes (Agent: If no, request that the patient contact the pharmacy for the refill. If patient does not wish to contact the pharmacy document the reason why and proceed with request.) (Agent: If yes, when and what did the pharmacy advise?) Pharmacy needs order to refill  This is the patient's preferred pharmacy:  [facility]  [place], [place]  [phone]  Is this the correct pharmacy for this prescription? Yes If no, delete pharmacy and type the correct one.   Has the prescription been filled recently? No  Is the patient out of the medication? No  Has the patient been seen for an appointment in the last year OR does the patient have an upcoming appointment? Yes  Can we respond through MyChart? No  Agent: Please be advised that Rx refills may take up to 3 business days. We ask that you follow-up with your pharmacy.

## 2024-11-22 MED ORDER — GABAPENTIN 300 MG PO CAPS: 300.0000 mg | ORAL_CAPSULE | Freq: Every day | ORAL | 0 refills | Status: AC

## 2024-11-22 NOTE — Telephone Encounter (Signed)
 Called pt and LM on VM that med was reordered and sent to [facility].

## 2024-11-22 NOTE — Telephone Encounter (Signed)
 Copied from CRM [id number]. Topic: Clinical - Medication Question >> Nov 22, 2024 10:20 AM Alfonso HERO wrote: Reason for CRM: pharmacy calling in regards to refill of gabapentin  (NEURONTIN ) 300 MG capsule . The patient is there upset. Please call the patient the a status of the refill request.

## 2024-11-22 NOTE — Telephone Encounter (Signed)
 Requested Prescriptions  Pending Prescriptions Disp Refills   gabapentin  (NEURONTIN ) 300 MG capsule 90 capsule 0    Sig: Take 1 capsule (300 mg total) by mouth daily.     Neurology: Anticonvulsants - gabapentin  Passed - 11/22/2024 11:26 AM      Passed - Cr in normal range and within 360 days    Creat  Date Value Ref Range Status  05/15/2020 0.98 0.70 - 1.25 mg/dL Final    Comment:    For patients >74 years of age, the reference limit for Creatinine is approximately 13% higher for people identified as African-American. .    Creatinine, Ser  Date Value Ref Range Status  02/14/2024 0.80 0.76 - 1.27 mg/dL Final         Passed - Completed PHQ-2 or PHQ-9 in the last 360 days      Passed - Valid encounter within last 12 months    Recent Outpatient Visits           5 months ago Essential hypertension   [place] [facility] [place], Megan P, DO   9 months ago Encounter for [employer] annual wellness exam   [place] [facility] [place], Zachary, DO

## 2024-12-12 ENCOUNTER — Ambulatory Visit: Admitting: Family Medicine
